# Patient Record
Sex: Female | Born: 1935
Health system: Southern US, Community
[De-identification: ages and names within clinical notes are randomized; demographics above are authoritative.]

## PROBLEM LIST (undated history)

## (undated) DIAGNOSIS — J309 Allergic rhinitis, unspecified: Secondary | ICD-10-CM

## (undated) DIAGNOSIS — Z87442 Personal history of urinary calculi: Secondary | ICD-10-CM

## (undated) DIAGNOSIS — H269 Unspecified cataract: Secondary | ICD-10-CM

## (undated) DIAGNOSIS — K219 Gastro-esophageal reflux disease without esophagitis: Secondary | ICD-10-CM

## (undated) DIAGNOSIS — M1990 Unspecified osteoarthritis, unspecified site: Secondary | ICD-10-CM

## (undated) DIAGNOSIS — D490 Neoplasm of unspecified behavior of digestive system: Secondary | ICD-10-CM

## (undated) DIAGNOSIS — D649 Anemia, unspecified: Secondary | ICD-10-CM

## (undated) DIAGNOSIS — I10 Essential (primary) hypertension: Secondary | ICD-10-CM

## (undated) DIAGNOSIS — E039 Hypothyroidism, unspecified: Secondary | ICD-10-CM

## (undated) DIAGNOSIS — J45909 Unspecified asthma, uncomplicated: Secondary | ICD-10-CM

## (undated) HISTORY — PX: PAROTID GLAND TUMOR EXCISION: SHX5221

## (undated) HISTORY — DX: Anemia, unspecified: D64.9

## (undated) HISTORY — PX: LUMBAR DISC SURGERY: SHX700

## (undated) HISTORY — PX: GANGLION CYST EXCISION: SHX1691

## (undated) HISTORY — DX: Unspecified cataract: H26.9

## (undated) HISTORY — DX: Unspecified osteoarthritis, unspecified site: M19.90

## (undated) HISTORY — DX: Allergic rhinitis, unspecified: J30.9

## (undated) HISTORY — DX: Neoplasm of unspecified behavior of digestive system: D49.0

## (undated) HISTORY — DX: Gastro-esophageal reflux disease without esophagitis: K21.9

## (undated) HISTORY — DX: Personal history of urinary calculi: Z87.442

## (undated) HISTORY — DX: Hypothyroidism, unspecified: E03.9

## (undated) HISTORY — DX: Essential (primary) hypertension: I10

## (undated) HISTORY — PX: TONSILLECTOMY AND ADENOIDECTOMY: SUR1326

## (undated) HISTORY — PX: TUBAL LIGATION: SHX77

## (undated) HISTORY — DX: Unspecified asthma, uncomplicated: J45.909

---

## 1991-01-26 HISTORY — PX: CERVICAL DISCECTOMY: SHX98

## 1998-07-15 ENCOUNTER — Other Ambulatory Visit: Admission: RE | Admit: 1998-07-15 | Discharge: 1998-07-15 | Payer: Self-pay | Admitting: Obstetrics and Gynecology

## 2000-06-02 ENCOUNTER — Encounter: Payer: Self-pay | Admitting: Cardiology

## 2000-06-02 ENCOUNTER — Encounter: Admission: RE | Admit: 2000-06-02 | Discharge: 2000-06-02 | Payer: Self-pay | Admitting: Cardiology

## 2002-04-06 ENCOUNTER — Encounter: Payer: Self-pay | Admitting: Cardiology

## 2002-04-06 ENCOUNTER — Encounter: Admission: RE | Admit: 2002-04-06 | Discharge: 2002-04-06 | Payer: Self-pay | Admitting: Cardiology

## 2003-01-26 HISTORY — PX: OTHER SURGICAL HISTORY: SHX169

## 2003-03-29 ENCOUNTER — Other Ambulatory Visit: Admission: RE | Admit: 2003-03-29 | Discharge: 2003-03-29 | Payer: Self-pay | Admitting: Obstetrics and Gynecology

## 2003-04-16 ENCOUNTER — Encounter: Admission: RE | Admit: 2003-04-16 | Discharge: 2003-04-16 | Payer: Self-pay | Admitting: Cardiology

## 2003-06-12 ENCOUNTER — Inpatient Hospital Stay (HOSPITAL_COMMUNITY): Admission: RE | Admit: 2003-06-12 | Discharge: 2003-06-16 | Payer: Self-pay | Admitting: Orthopedic Surgery

## 2005-06-16 ENCOUNTER — Other Ambulatory Visit: Admission: RE | Admit: 2005-06-16 | Discharge: 2005-06-16 | Payer: Self-pay | Admitting: Otolaryngology

## 2005-06-26 ENCOUNTER — Encounter: Admission: RE | Admit: 2005-06-26 | Discharge: 2005-06-26 | Payer: Self-pay | Admitting: Otolaryngology

## 2005-07-12 HISTORY — PX: CARDIOVASCULAR STRESS TEST: SHX262

## 2005-09-20 ENCOUNTER — Inpatient Hospital Stay (HOSPITAL_COMMUNITY): Admission: RE | Admit: 2005-09-20 | Discharge: 2005-09-22 | Payer: Self-pay | Admitting: Otolaryngology

## 2005-09-20 ENCOUNTER — Encounter (INDEPENDENT_AMBULATORY_CARE_PROVIDER_SITE_OTHER): Payer: Self-pay | Admitting: *Deleted

## 2006-01-25 HISTORY — PX: OTHER SURGICAL HISTORY: SHX169

## 2006-02-28 ENCOUNTER — Encounter: Admission: RE | Admit: 2006-02-28 | Discharge: 2006-02-28 | Payer: Self-pay | Admitting: Cardiology

## 2006-04-22 ENCOUNTER — Inpatient Hospital Stay (HOSPITAL_COMMUNITY): Admission: RE | Admit: 2006-04-22 | Discharge: 2006-04-25 | Payer: Self-pay | Admitting: Orthopedic Surgery

## 2006-04-22 HISTORY — PX: KNEE ARTHROPLASTY: SHX992

## 2007-08-11 ENCOUNTER — Encounter: Admission: RE | Admit: 2007-08-11 | Discharge: 2007-08-11 | Payer: Self-pay | Admitting: Cardiology

## 2009-02-07 ENCOUNTER — Ambulatory Visit: Payer: Self-pay | Admitting: Pulmonary Disease

## 2009-02-07 DIAGNOSIS — E039 Hypothyroidism, unspecified: Secondary | ICD-10-CM

## 2009-02-07 DIAGNOSIS — I10 Essential (primary) hypertension: Secondary | ICD-10-CM | POA: Insufficient documentation

## 2009-02-07 DIAGNOSIS — J45909 Unspecified asthma, uncomplicated: Secondary | ICD-10-CM

## 2009-02-07 DIAGNOSIS — J309 Allergic rhinitis, unspecified: Secondary | ICD-10-CM | POA: Insufficient documentation

## 2009-05-09 ENCOUNTER — Ambulatory Visit: Payer: Self-pay | Admitting: Pulmonary Disease

## 2009-06-06 ENCOUNTER — Telehealth (INDEPENDENT_AMBULATORY_CARE_PROVIDER_SITE_OTHER): Payer: Self-pay | Admitting: *Deleted

## 2009-09-16 ENCOUNTER — Ambulatory Visit: Payer: Self-pay | Admitting: Cardiology

## 2009-09-19 ENCOUNTER — Ambulatory Visit: Payer: Self-pay | Admitting: Cardiology

## 2010-01-22 ENCOUNTER — Ambulatory Visit: Payer: Self-pay | Admitting: Cardiology

## 2010-01-27 ENCOUNTER — Ambulatory Visit: Payer: Self-pay | Admitting: Cardiology

## 2010-02-26 NOTE — Assessment & Plan Note (Signed)
Summary: rov for dyspnea   Copy to:  [name] Primary Provider/Referring Provider:  [name]  CC:  3 month follow up and denies cough has only used pro-air once or twice over the last 3 months.  History of Present Illness: The pt comes in today for f/u of her dyspnea and ?asthma.  The decision was made at the last visit to leave her on emperic treatment for asthma (spirometry did not show obstruction) for the next 3 mos and re-evaluate.  It was unclear whether she had asthma or not.  I was concerned and remain so that a lot of her dyspnea may be due to her obesity, deconditioning, and very large hiatal hernia?  She comes in today where she is doing much better.  She feels consistent use of her current regimen has made a difference, and has not had to use rescue inhaler more than 1-2 times over the last 3 mos.  However, the pt has also joined a gym, and is going religiously 2 times a week.  Her weight is down almost 3 pounds since last visit.  Current Medications (verified): 1)  Synthroid 200 Mcg Tabs (Levothyroxine Sodium) .... 1/2 Tab By Mouth On M, W, and F and 1 Tab On The Other Days 2)  Lodine Xl 400mg  .... Take 1 Tablet By Mouth Once A Day 3)  Hyzaar 100/50 .... Take 1 Tablet By Mouth Once A Day 4)  Pepcid 20 Mg Tabs (Famotidine) .... Take 1 Tablet By Mouth Once A Day 5)  Vitamin E 200 Unit Caps (Vitamin E) .... Take 1 Tablet By Mouth Once A Day 6)  B Complex  Tabs (B Complex Vitamins) .... Take 1 Tablet By Mouth Once A Day 7)  Glucosamine-Chondroitin 1500mg /1250mg  .... Take 2 Tabs By Mouth At Bedtime 8)  Prenatal Vitamin .... Take 1 Tablet By Mouth Once A Day 9)  Proair Hfa 108 (90 Base) Mcg/act  Aers (Albuterol Sulfate) .Marland Kitchen.. 1-2 Puffs Every 4-6 Hours As Needed 10)  Advair Diskus 250-50 Mcg/dose  Misc (Fluticasone-Salmeterol) .... One Puff Twice Daily 11)  Lipitor 20 Mg Tabs (Atorvastatin Calcium) .Marland Kitchen.. 1 At Bedtime  Allergies (verified): 1)  ! Erythromycin 2)  ! *  "pain Meds"  Review of Systems      See HPI  Vital Signs:  Patient profile:   76 year old female Height:      61 inches Weight:      182.6 pounds O2 Sat:      95 % on Room air Temp:     98.1 degrees F oral Pulse rate:   75 / minute BP sitting:   150 / 90  (right arm)  Vitals Entered By: Renold Genta RCP, LPN (May 09, 2009 9:56 AM)  O2 Flow:  Room air CC: 3 month follow up,denies cough has only used pro-air once or twice over the last 3 months Comments Medications reviewed with patient Anna Adams RCP, LPN  May 09, 2009 10:01 AM    Physical Exam  General:  obese female in nad Lungs:  totally clear to auscultation Heart:  rrr Extremities:  minimal edema, no cyanosis Neurologic:  alert and oriented, moves all 4.   Impression & Recommendations:  Problem # 1:  DYSPNEA (ICD-786.05) It remains unclear whether the pt may have asthma or not.  She is improved from the last visit since being on advair consistently, but she also has been more active in a conditioning program.  We could do a methacholine challenge  test to put the issue to rest, but I think the easier thing to do is to stop advair at this point, and see how she does.  I continue to worry that her very large hiatal hernia and her obesity/deconditioning are more important drivers of her dyspnea.  I have asked her to continue with her conditioning program off advair, and let me know how things go.    Medications Added to Medication List This Visit: 1)  Lipitor 20 Mg Tabs (Atorvastatin calcium) .Marland Kitchen.. 1 at bedtime  Other Orders: Est. Patient Level II (78295)  Patient Instructions: 1)  try to come off advair for the next 4 weeks, and see if there is any change in your breathing.  If not, stay off advair and continue to work on weight loss and conditioning.  If you see a decline in your breathing, then get back on advair and stay on the medication.  Let me know either Bergstresser.   2)  followup with me as needed if your  breathing is not at an acceptable level.

## 2010-02-26 NOTE — Progress Notes (Signed)
Summary: FYI-doing well off of advair  Phone Note Call from Patient Call back at Home Phone [phone]   Caller: Patient Call For: clance Summary of Call: FYI: Pt states she was supposed and let him know is she was still off advair after 4 wks wants him to know that she is still off this med. Initial call taken by: Darletta Moll,  Jun 06, 2009 1:33 PM  Follow-up for Phone Call        Pt states she has been off of advair x 4 weeks and her breathing is doing well so she has continued to stay off of the inhaler. . She was told at last OV to give you an update.Carron Curie CMA  Jun 06, 2009 1:45 PM   Additional Follow-up for Phone Call Additional follow up Details #1::        let her know to stay off advair, and continuing working on exercise program.  call if issues with breathing. Additional Follow-up by: Barbaraann Share MD,  Jun 06, 2009 5:30 PM    Additional Follow-up for Phone Call Additional follow up Details #2::    LMOMTCB Vernie Murders  Jun 06, 2009 5:32 PM  [facility] Abigail Miyamoto RN  Jun 09, 2009 9:58 AM   Spoke with pt and notified of recs per [facility].  Pt verbalized understanding. Follow-up by: Vernie Murders,  Jun 09, 2009 2:49 PM

## 2010-02-26 NOTE — Assessment & Plan Note (Signed)
Summary: consult for asthma, dyspnea   Copy to:  Anna Adams Primary Provider/Referring Provider:  Cassell Adams  CC:  Pulmonary Consult.  History of Present Illness: The pt is a 76y/o female who I have been asked to see for asthma and dyspnea.  She was diagnosed with asthma at least 54yrs ago, and was followed by Dr. Andria Meuse.  She has been managed all of this time with albuterol as needed only.  She feels that she has been doing well until 2009, when she began to have more seasonal issues with her breathing.  Starting 01/08/09, she began to notice worsening cough and audible wheezing, as well as worsening sob.  Treated with antibiotics, and medrol taper without improvement.  She was treated again with medrol dose pak, and she began borrowed her daughter's nebulizer machine with xopenex.  This at least got her thru xmas.  She ultimately went to [facility] where she was put on advair, and also treated with a course of levaquin.  A cxr there showed no acute infiltrate, but she had a HUGE hiatal hernia with significant volume of stomach in chest.    The pt states that she improved within 48hrs.  She feels much better, and has continued on advair.  However, she notes that she has not returned to her usual active lifestyle.  She denies any cough or mucus currently.  She denies active reflux symptoms.  She does have a h/o thyroid disease, and tells me her last TSH was abnormal and required med adjustment.   Preventive Screening-Counseling & Management  Alcohol-Tobacco     Smoking Status: quit  Current Medications (verified): 1)  Synthroid 200 Mcg Tabs (Levothyroxine Sodium) .... 1/2 Tab By Mouth On M, W, and F and 1 Tab On The Other Days 2)  Lodine Xl 400mg  .... Take 1 Tablet By Mouth Once A Day 3)  Hyzaar 100/50 .... Take 1 Tablet By Mouth Once A Day 4)  Pepcid 20 Mg Tabs (Famotidine) .... Take 1 Tablet By Mouth Once A Day 5)  Vitamin E 200 Unit Caps (Vitamin E) .... Take 1 Tablet By Mouth  Once A Day 6)  B Complex  Tabs (B Complex Vitamins) .... Take 1 Tablet By Mouth Once A Day 7)  Glucosamine-Chondroitin 1500mg /1250mg  .... Take 2 Tabs By Mouth At Bedtime 8)  Prenatal Vitamin .... Take 1 Tablet By Mouth Once A Day 9)  Proair Hfa 108 (90 Base) Mcg/act  Aers (Albuterol Sulfate) .Marland Kitchen.. 1-2 Puffs Every 4-6 Hours As Needed 10)  Advair Diskus 250-50 Mcg/dose  Misc (Fluticasone-Salmeterol) .... One Puff Twice Daily  Allergies (verified): 1)  ! Erythromycin 2)  ! * "pain Meds"  Past History:  Past Medical History: h/o asthma HYPOTHYROIDISM (ICD-244.9)--h/o thyrotoxicosis, s/p RAI HYPERTENSION (ICD-401.9) ALLERGIC RHINITIS (ICD-477.9)    Past Surgical History: cervical disc surgery 1993 L parotid tumor surgery 1980 and 2007 Tonsillectomy and adenoidectomy 1941 ganglion cyst removed L wrist 1954 and 1963 L knee replacement 2005 R knee replacement 2008 thyrotoxicosis treated with radiation 1978  Family History: Reviewed history and no changes required. emphysema: father asthma: mother heart disease: mother, father rheumatism: father, mother cancer: mother (breast and brain)   Social History: Reviewed history and no changes required. Patient states former smoker.  started at age 31.  1ppd.  quit 1963. pt is married. pt has children. pt is a retired Engineer, civil (consulting). Smoking Status:  quit  Review of Systems       The patient complains of shortness of breath with  activity, shortness of breath at rest, sneezing, and joint stiffness or pain.  The patient denies coughing up blood, chest pain, irregular heartbeats, acid heartburn, indigestion, loss of appetite, weight change, abdominal pain, difficulty swallowing, sore throat, tooth/dental problems, headaches, nasal congestion/difficulty breathing through nose, itching, ear ache, anxiety, depression, hand/feet swelling, rash, and fever.    Vital Signs:  Patient profile:   76 year old female Height:      62 inches Weight:       185.13 pounds BMI:     33.98 O2 Sat:      96 % on Room air Temp:     97.6 degrees F oral Pulse rate:   104 / minute BP sitting:   140 / 74  (left arm) Cuff size:   regular  Vitals Entered By: Arman Filter LPN (February 07, 2009 1:33 PM)  O2 Flow:  Room air CC: Pulmonary Consult Comments Medications reviewed with patient Arman Filter LPN  February 07, 2009 1:33 PM    Physical Exam  General:  obese female in nad Eyes:  PERRLA and EOMI.   Nose:  patent without discharge Mouth:  clear Neck:  no jvd, tmg, LN Lungs:  decreased depth of inspiration, no wheezing or rhonchi Heart:  rrr, 2/6 sem Abdomen:  soft and nontender, bs+ Extremities:  1+ edema bilat, pulses intact distally no cyanosis Neurologic:  alert and oriented, moves all 4.   Impression & Recommendations:  Problem # 1:  INTRINSIC ASTHMA, UNSPECIFIED (ICD-493.10) the pt has a h/o asthma dating back 58yrs, but has not been on ICS in the past.  It is unclear to me if her recent symptomatology was indeed caused by asthma given her poor response to by mouth steroids, but she feels the advair ultimately did make her better.  Her spirometry today shows no airflow obstruction, but does show severe restriction (see below).  At this point, I would like to continue her on advair since it seems to have helped.  I would probably give her 3 mos of therapy, and then re-evaluate whether she really needs that level of treatment.  Will also give [place] an opportunity to establish whether she really has clinically signficant asthma.  Problem # 2:  DYSPNEA (ICD-786.05)  the pt has had issues with sob that may or may not be related to asthma.  She is better, but has not returned to her normal activities.  Her spirometry today shows severe restriction, and this may be due to her centripetal obesity vs her very large hiatal hernia.  Now that she is doing better, would like her to return to her normal activities and see how things go.  If she  continues to have sob, would consider whether her large hiatal hernia could be contributing, and whether to consider surgical evaluation.  Medications Added to Medication List This Visit: 1)  Synthroid 200 Mcg Tabs (Levothyroxine sodium) .... 1/2 tab by mouth on m, w, and f and 1 tab on the other days 2)  Lodine Xl 400mg   .... Take 1 tablet by mouth once a day 3)  Hyzaar 100/50  .... Take 1 tablet by mouth once a day 4)  Pepcid 20 Mg Tabs (Famotidine) .... Take 1 tablet by mouth once a day 5)  Vitamin E 200 Unit Caps (Vitamin e) .... Take 1 tablet by mouth once a day 6)  B Complex Tabs (B complex vitamins) .... Take 1 tablet by mouth once a day 7)  Glucosamine-chondroitin 1500mg /1250mg   .... Take  2 tabs by mouth at bedtime 8)  Prenatal Vitamin  .... Take 1 tablet by mouth once a day 9)  Proair Hfa 108 (90 Base) Mcg/act Aers (Albuterol sulfate) .Marland Kitchen.. 1-2 puffs every 4-6 hours as needed 10)  Advair Diskus 250-50 Mcg/dose Misc (Fluticasone-salmeterol) .... One puff twice daily  Other Orders: Consultation Level IV (16109) Spirometry w/Graph (60454)  Patient Instructions: 1)  continue on advair for the next 3 mos 2)  followup with me in 3mos 3)  if you cannot return to your normal activities due to persistent shortness of breath, please call me.  Would consider looking closer at hiatal hernia if this is the case. 4)  work on weight loss.   Prescriptions: ADVAIR DISKUS 250-50 MCG/DOSE  MISC (FLUTICASONE-SALMETEROL) One puff twice daily  #3 x 1   Entered and Authorized by:   Barbaraann Share MD   Signed by:   Barbaraann Share MD on 02/07/2009   Method used:   Print then Give to Patient   RxID:   [id number]     CardioPerfect Spirometry  ID: [id number] Patient: Anna Adams DOB: May 09, 1934 Age: 76 Years Old Sex: Female Race: White Height: 62 Weight: 185.13 Status: Unconfirmed Recorded: 02/07/2009 2:11 PM  Parameter  Measured Predicted %Predicted FVC     0.95        2.66         35.70 FEV1     0.72        2.00        35.90 FEV1%   75.62        75.51        100.10 PEF    3.39        5.16        65.80   Interpretation: severe restriction, no obstruction by FEV1/FVC ratio cannot exclude a component of airtrapping without lung volumes, but FVL most c/w restriction.

## 2010-04-20 ENCOUNTER — Telehealth: Payer: Self-pay | Admitting: *Deleted

## 2010-04-20 ENCOUNTER — Ambulatory Visit
Admission: RE | Admit: 2010-04-20 | Discharge: 2010-04-20 | Disposition: A | Discharge status: Home / Self Care | Payer: Medicare Other | Source: Ambulatory Visit | Attending: Cardiology | Admitting: Cardiology

## 2010-04-20 DIAGNOSIS — R0602 Shortness of breath: Secondary | ICD-10-CM

## 2010-04-20 DIAGNOSIS — R05 Cough: Secondary | ICD-10-CM

## 2010-04-20 NOTE — Telephone Encounter (Signed)
Message copied by Regis Bill on Mon Apr 20, 2010  2:08 PM ------      Message from: Sheffield Slider      Created: Mon Apr 20, 2010 10:47 AM      Regarding: STILL HAVING SOB/BRACKBILL      Contact: [phone]       PT SAID TO LET Anna Adams KNOW STILL HAVING ISSUES WITH SOB AT TIMES. WHERE DO SHE GO FROM HERE? CHART IN BOX.  1047AM

## 2010-04-20 NOTE — Telephone Encounter (Signed)
Patient phoned and has finished zpak, medrol dose-pak, and started advair 3/21 and is still having some shortness of breath.  Thought she was getting better, however last night had a hard time breathing,  Will get chest xray.  Advised patient.

## 2010-04-21 ENCOUNTER — Other Ambulatory Visit (INDEPENDENT_AMBULATORY_CARE_PROVIDER_SITE_OTHER): Payer: Medicare Other | Admitting: *Deleted

## 2010-04-21 ENCOUNTER — Telehealth: Payer: Self-pay | Admitting: *Deleted

## 2010-04-21 DIAGNOSIS — R0602 Shortness of breath: Secondary | ICD-10-CM

## 2010-04-21 DIAGNOSIS — R0789 Other chest pain: Secondary | ICD-10-CM

## 2010-04-21 LAB — BASIC METABOLIC PANEL
BUN: 15 mg/dL (ref 6–23)
Calcium: 10.2 mg/dL (ref 8.4–10.5)
Glucose, Bld: 110 mg/dL — ABNORMAL HIGH (ref 70–99)

## 2010-04-21 NOTE — Telephone Encounter (Signed)
Called patient and advised of chest xray,  Dr Patty Sermons does however want to get BNP, BMET, & CBC.  Pt scheduled for labs at 11:00.

## 2010-04-22 LAB — CBC WITH DIFFERENTIAL/PLATELET
Basophils Absolute: 0 10*3/uL (ref 0.0–0.1)
Basophils Relative: 0 % (ref 0–1)
Eosinophils Absolute: 0.1 10*3/uL (ref 0.0–0.7)
Eosinophils Relative: 1 % (ref 0–5)
HCT: 41 % (ref 36.0–46.0)
MCH: 28.5 pg (ref 26.0–34.0)
MCHC: 32.4 g/dL (ref 30.0–36.0)
MCV: 88 fL (ref 78.0–100.0)
Monocytes Absolute: 1.3 10*3/uL — ABNORMAL HIGH (ref 0.1–1.0)
RDW: 13.4 % (ref 11.5–15.5)

## 2010-04-24 ENCOUNTER — Telehealth: Payer: Self-pay | Admitting: *Deleted

## 2010-04-24 NOTE — Progress Notes (Signed)
Advised patient

## 2010-04-24 NOTE — Telephone Encounter (Signed)
Message copied by Regis Bill on Fri Apr 24, 2010 10:01 AM ------      Message from: [name]      Created: Wed Apr 22, 2010  9:36 AM       Please report.  Her white count is elevated at 17,000.Her be met is satisfactory with slightly elevated blood sugar at 110.  She is still clinically ill and we will call her in Avelox 400 mg daily for 5 days.  Her B. Natruretic peptide is not yet reported

## 2010-04-24 NOTE — Telephone Encounter (Signed)
Message copied by Regis Bill on Fri Apr 24, 2010 10:02 AM ------      Message from: [name]      Created: Thu Apr 23, 2010  3:00 PM       bnp norma.  No chf.

## 2010-04-24 NOTE — Progress Notes (Signed)
Advised 

## 2010-04-24 NOTE — Telephone Encounter (Signed)
Advised patient

## 2010-04-24 NOTE — Telephone Encounter (Signed)
Advised and rx called

## 2010-06-12 NOTE — Discharge Summary (Signed)
Anna Adams, Anna Adams                            ACCOUNT NO.:  [id number]   MEDICAL RECORD NO.:  [id number]                   PATIENT TYPE:  INP   LOCATION:  5017                                 FACILITY:  [facility]   PHYSICIAN:  Dyke Brackett, M.D.                 DATE OF BIRTH:  1934-12-27   DATE OF ADMISSION:  06/12/2003  DATE OF DISCHARGE:  06/16/2003                                 DISCHARGE SUMMARY   ADMISSION DIAGNOSES:  1. End-stage osteoarthritis, left knee.  2. Chronic anemia, receiving Procrit.  3. Hypertension.  4. Thyroid disease.  5. Hiatal hernia.  6. History of asthma.   DISCHARGE DIAGNOSES:  1. Status post left total knee arthroplasty.  2. Acute blood loss anemia secondary to surgery, asymptomatic.  3. Chronic anemia, receiving Procrit.  4. Hypertension.  5. Thyroid disease.  6. Hiatal hernia.  7. History of asthma.   HISTORY OF PRESENT ILLNESS:  Anna Adams is a 76 year old white female with knee  pain, left knee for at least 15 years.  However, left knee pain has became  progressively worse over the past two years.  The patient notes that she has  significant difficulty with activities due to stiffness in the knee.  She  does have deep, achy pain with occasional sharp shooting pain, mostly along  the medial joint.  The pain does radiate down the lower leg.  The patient  notes popping and grinding in the left knee.  X-rays of the left knee reveal  bone on bone in the medial compartment.  The patient has a history of  chronic anemia and has received Procrit injections prior to admission.  The  patient was admitted to the hospital on Jun 12, 2003, to undergo a left  total knee arthroplasty.   ALLERGIES:  LUFYLLIN, MEVACOR, IV CONTRAST DYE.   CURRENT MEDICATIONS:  1. Hyzaar 25/12.5 mg p.o. daily.  2. Synthroid 0.2 mg p.o. daily.  3. Lodine XL 400 mg p.o. daily.  4. Prenatal vitamins one p.o. daily.  5. Pepcid 20 mg p.o. daily.  6. Lipitor 20 mg p.o. daily.  7.  Vitamin E 200 international units daily.  8. Glucosamine and chondroitin daily.  9. Albuterol inhaler p.r.n.  10.      Niferex Forte 150 mg p.o. daily.   SURGICAL PROCEDURE:  The patient was taken to the operating room on Jun 12, 2003, by Dyke Brackett, M.D., assisted by Legrand Pitts. Duffy, P.A.  The patient  was placed under general anesthesia with a preop femoral nerve block.  The  patient then underwent a left total knee arthroplasty using the following  components:  LCS standard femur, three-peg patella, size 3 tibia, and a 12.5  mm bearing.  The patient tolerated the procedure well and returned to  recovery in good, stable condition.   CONSULTATIONS:  The following consults were obtained while the  patient was  hospitalized:  PT/OT, case management.   HOSPITAL COURSE:  The patient developed acute blood loss anemia secondary to  surgery, however remained asymptomatic.  The patient did develop a slight  bump in the white blood count, however remained afebrile and at time of  discharge, white count was trending down at 9900.  The patient did develop  some nausea due to GI upset thought to be caused by iron and Colace.  These  medications were all held.  The patient was discharged home on postop day 4  in good, stable condition, and the patient was afebrile, vital signs were  stable.   LABORATORY DATA:  Routine labs on admission:  CBC on admission was all  values within normal limits.  Coagulation studies on admission were all  within normal limits.  Routine chemistries on admission:  Glucose was  slightly elevated at 100, CO2 was 33, calcium was 10.8.  Hepatic enzymes on  admission, AST 23, ALT 22, ALP of 120, total bilirubin was 0.7.  Urinalysis  on admission was negative.   X-RAYS:  Two views of the left knee showed total left knee replacement and  good alignment and __________ position.   DISCHARGE INSTRUCTIONS:   DISCHARGE MEDICATIONS:  The patient was to resume home  medications and add  the following:   1. Lovenox 40 mg one injection subcu daily at 8 a.m., for a total of 11     days.  2. Tylenol for pain.   PAIN MANAGEMENT:  Ice and elevation and Tylenol.   ACTIVITY:  Weightbearing as tolerated with walker.  Home health PT, [facility].   DIET:  No restrictions.   WOUND CARE:  The patient is to change dressing daily and may shower after  two days if no drainage.  The patient is to call the office at [phone] if  temperature is greater than 100.5, chills, foul-smell drainage, or pain not  controlled.   SPECIAL INSTRUCTIONS:  CPM 0-90 degrees, increase by 10 degrees daily, six  to eight hours per day.  The patient is to follow up with Dr. Madelon Lips in the  office approximately 10 days from discharge.  The patient is to call [phone] for appointment.      Richardean Canal, Arnetha Courser, M.D.    GC/MEDQ  D:  08/28/2003  T:  08/29/2003  Job:  [id number]

## 2010-06-12 NOTE — Discharge Summary (Signed)
NAMECANDIA, KINGSBURY                  ACCOUNT NO.:  [id number]   MEDICAL RECORD NO.:  [id number]          PATIENT TYPE:  INP   LOCATION:  5022                         FACILITY:  [facility]   PHYSICIAN:  Dyke Brackett, M.D.    DATE OF BIRTH:  Mar 29, 1934   DATE OF ADMISSION:  04/22/2006  DATE OF DISCHARGE:  04/25/2006                               DISCHARGE SUMMARY   ADMISSION DIAGNOSES:  1. End-stage osteoarthritis, right knee.  2. Hypertension.  3. History of excision of parotid tumor.  4. History of renal calculi.   DISCHARGE DIAGNOSES:  1. End-stage osteoarthritis, right knee, status post right total knee      arthroplasty.  2. Acute blood loss anemia secondary to surgery.  3. History of elevated liver functions due to Lipitor.  4. Hyponatremia, mild.  5. Constipation, now resolved.  6. Hypertension.  7. History of parotid tumor, excised.  8. History of renal calculi.   SURGICAL PROCEDURES:  On April 22, 2006, Ms. Underhill underwent a right total  knee arthroplasty by Dr. Marcie Mowers assisted by Rexene Edison, PA-C.  She had an LPS complete metal back patella cemented size standard placed  with LPS complete RP insert size standard 10 mm thickness.  An LCS  complete primary femoral component cemented size standard right with a  DePuy NBT keel tibial tray cemented size 3.   COMPLICATIONS:  None.   CONSULTATIONS:  1. Physical therapy consult April 23, 2006.  2. Occupational therapy consult April 25, 2006.  3. Case management consult.   HISTORY OF PRESENT ILLNESS:  This 76 year old white female patient  presented to Dr. Madelon Lips with history of moderately severe occasional  stabbing right knee pain.  She has a history of left knee replacement in  2005.  Right knee pain is getting worse and interfering with her  activities of daily living.  She has difficulty walking, and x-rays show  end-stage arthritic changes.  Because of this, she is presenting for a  right knee replacement.   HOSPITAL COURSE:  Ms. Folden tolerated her surgical procedure well without  immediate postoperative complications.  She was transferred to 5000.  Postop day #1, T-max was 101.1, vital signs were stable.  Hemoglobin 10,  hematocrit 29.4.  AST was 52. ALT was 54.  Leg was neurovascularly  intact.  She was weaned off oxygen and started on therapy per protocol.  Other labs were monitored.   Postop day #2, she was voiding without difficulty.  T-max was 99.4,  vital signs stable.  Hemoglobin 9.1, hematocrit 26.9.  Sodium a bit low  of 132.  Hemovac was discontinued. Dressing was changed. Leg was  neurovascularly intact.  O2 saturations had been a bit low, and that was  monitored.  She was switched to p.o. pain medications and continued on  therapy.  She did have some problems with constipation, and that was  treated effectively with a laxative.   She continued to make good progress.  On postop day #3, pain was well  controlled with p.o. medications.  Her hemoglobin was 8.7, hematocrit  25.4.  Sodium improved to 136.  Vital signs were otherwise stable.  Leg  was neurovascularly intact.  CBC was rechecked, and it was felt she was  ready for discharge home was discharged home later that day.   DISCHARGE INSTRUCTIONS:   DIET:  She can resume her regular prehospitalization diet.   MEDICATIONS:  She may resume her home medications except no glucosamine,  loading vitamin E, or Lipitor.  Home medications include:  1. Synthroid 0.2 mg p.o. q.a.m..  2. Hyzaar 50/12.5 mg 1 tablet p.o. q.a.m.Marland Kitchen  3. Pepcid 20 mg p.o. q.a.m.Marland Kitchen  4. Prenatal vitamin 1 tablet p.o. q.a.m..  5. Tylenol p.r.n. for pain.  6. Albuterol inhaler to use p.r.n. difficulty breathing.   Additional medications at this time include:  1. Lovenox 40 mg subcutaneously q.a.m. with the last dose to be May 06, 2006.  2. Dilaudid 2 mg 1 tablet p.o. q.3-4 h p.r.n. for pain.  3. Reglan 5 mg 1 tablet p.o. q.8 h p.r.n. for nausea.    ACTIVITY:  She can be out of bed weightbearing as tolerated on the right  leg.  She is to have home CPM zero to 90 degrees 6-8 hours a day.  Home  health PT.  Please see the blue total knee discharge sheet for further  activity instructions.   WOUND CARE:  She may shower after no drainage from the wound for 2 days.  Please see the blue total knee discharge sheet for further wound care  instructions.   FOLLOWUP:  She needs to follow up with Dr. Madelon Lips in our office on  Thursday, May 05, 2006, and needs to call [phone] for that  appointment.   LABORATORY DATA:  X-ray taken of her right knee on March 28 showed the  right knee replacement without immediate complicating features.   Hemoglobin and hematocrit ranged from 13.6 and 39.9, respectively, on  the March 28 to 8.7 and 25.4, respectively, on March 31 to a repeat on  March 31 at 9.5 and 28.4, respectively.  White count went to a high of  11.1 on March 30 and then was back within normal limits.   Sodium dropped to a low of 132 on March 30 and then bounced back within  normal limits.  Glucose ranged from 100 on March 25 to 130 on March 29  to 112 on March 31.   On March 25, her AST was 52, ALT 54, alkaline phosphatase 171.  All  other laboratory studies were within normal limits.      Legrand Pitts Duffy, Arnetha Courser, M.D.  Electronically Signed    KED/MEDQ  D:  06/08/2006  T:  06/08/2006  Job:  [id number]   cc:   [name], M.D.

## 2010-06-12 NOTE — H&P (Signed)
NAME:  Anna Adams, Anna Adams NO.:  [id number]   MEDICAL RECORD NO.:  [id number]                   PATIENT TYPE:  INP   LOCATION:                                       FACILITY:  [facility]   PHYSICIAN:  Dyke Brackett, M.D.                 DATE OF BIRTH:  06-02-1934   DATE OF ADMISSION:  06/12/2003  DATE OF DISCHARGE:                                HISTORY & PHYSICAL   CHIEF COMPLAINT:  Left knee pain.   HISTORY OF PRESENT ILLNESS:  The patient is a 76 year old white female with  knee pains and problems in the left for in excess of 15 years, but over the  last 2 years, it has significantly worsened.  She does have significant  difficulty with activities due to stiffness.  She does have a deep achy pain  with occasional sharp shooting pain mostly along the medial joint; it does  radiate down the lower leg.  She does have popping and grinding.  X-rays  reveal bone-on-bone medial compartment.  The patient does have a history of  chronic anemia and she has received Procrit injections prior to this  procedure.   ALLERGIES:  LUFYLLIN, MEVACOR and IV CONTRAST DYE.   CURRENT MEDICATIONS:  1. Hyzaar 25/12.5 mg p.o. daily.  2. Synthroid 0.2 mg p.o. daily.  3. Lodine XL 400 mg p.o. daily.  4. Prenatal vitamins 1 tablets p.o. daily.  5. Pepcid 20 mg p.o. daily.  6. Lipitor 20 mg p.o. daily.  7. Niferex Forte 150 mg p.o. daily.  8. Vitamin E 200 IU daily.  9. Glucosamine and chondroitin daily.  10.      Albuterol inhaler -- MDI -- p.r.n.   PAST MEDICAL HISTORY:  1. Hypertension.  2. Thyroid disease with thyrotoxicosis.  3. Hiatal hernia.  4. Asthma.  5. Chronic anemia.   PAST SURGICAL HISTORY:  1. T&A done in 1941.  2. Ganglion cysts, left wrist, 1954 and 1963.  3. Tubal ligation in 1974.  4. Parotid tumor, left, 1980.  5. Cervical disk in 1993.  6. The patient denies any complications to the above-mentioned procedures.   SOCIAL HISTORY:  The patient is a  76 year old short-in-stature, slightly  heavy-set white female.  Denies any history of smoking or alcohol use.  She  is married.  She has several grown children.  She lives in a 1-story house.  She is a retired Astronomer.   FAMILY PHYSICIAN:  Dr. [name] at [phone].   FAMILY MEDICAL HISTORY:  Mother is alive with a history of hypertension and  breast cancer with metastasis.  Father is deceased from heart disease.  The  patient has no siblings.   REVIEW OF SYSTEMS:  Review of systems is positive for glasses at all times.  She does have occasional shortness of breath with exertion; she describes it  as very slight; she had  no related cardiac symptoms; she relates it to her  conditioning and knee discomfort.  She has occasional problems with reflux  but as long as she takes her medicines, it is fairly well-controlled.  Otherwise, review of systems are negative.   PHYSICAL EXAMINATION:  VITALS:  Height is 5 foot 2 inches.  Weight is 178  pounds.  Blood pressure is 148/82, heart rate 80 and regular, respirations  12, patient is afebrile.  GENERAL:  This is short-in-stature, healthy-appearing, slightly heavy-set  white female.  She ambulates with a slight limp to the left side.  She is  able to get herself on and off the exam table without any difficulty or  having to use her arms to get out of the chair.  HEENT:  Head was normocephalic.  Pupils equal, round and reactive.  Extraocular movements intact.  External ears were without deformities.  Gross hearing was intact.  Nasal septum was midline.  Oral buccal mucosa was  pink and moist without lesions.  NECK:  Neck was supple.  No palpable lymphadenopathy.  Thyroid region was  nontender.  The patient had good range of motion of her cervical spine  without any difficulty or tenderness.  CHEST:  Lung sounds were clear and equal.  No wheezes, rales, rhonchi or  rubs noted.  HEART:  Regular rate and rhythm.  No murmurs.  ABDOMEN:  Abdomen  was round, soft and nontender.  Bowel sounds were  normoactive.  She had no hepatosplenomegaly.  CVA region was nontender.  EXTREMITIES:  Upper extremities were symmetrically sized and shaped.  She  had full range of motion of her shoulders, elbows and wrists.  Motor  strength was 5/5.  Lower extremities:  Right and left hips had full  extension, flexion up to 120 degrees with 20 degrees internal and external  rotation without any difficulty.  Right knee had no signs of erythema or  ecchymosis.  She was tenderness in medial or lateral joint, no effusion.  She had full extension, flexion to 115 degrees, no medial or lateral laxity,  no anterior or posterior drawer.  The calf was nontender.  Left knee was  without any signs of erythema or ecchymosis, no effusion.  She was tender  over medial joint line.  She had full extension.  She had about 105 degrees  of flexion.  She opened up with about 5 degrees with valgus stress.  The  calf was nontender.  Ankles were symmetrical with good dorsi and  plantarflexion.  PERIPHERAL VASCULATURE:  Carotid pulses were 2+, no bruits.  Radial pulses  were 2+.  Dorsalis pedis pulses were 1+, unable to palpate the posterior  tibial.  She had trace edema in bilateral ankles.  No venous stasis changes  or significant varicosities.  NEUROLOGIC:  The patient was conscious, alert and appropriate.  Cranial  nerves II-XII were grossly intact.  She was grossly intact to light touch  sensation from head to toe.  She had no gross neurological defects.  BREAST, RECTAL AND GU:  Exams were deferred at this time.   IMPRESSION:  1. End-stage osteoarthritis, left knee.  2. Chronic anemia, receiving Procrit.  3. Hypertension.  4. Thyroid disease.  5. Hiatal hernia.  6. History of asthma.   PLAN:  The patient will undergo all routine labs and tests prior to having a left total knee arthroplasty by Dr. Dyke Brackett at [facility] on  Jun 12, 2003.  The patient  will have Procrit injection #  4 on the date of  surgery.      Jamelle Rushing, Arnetha Courser, M.D.    RWK/MEDQ  D:  06/05/2003  T:  06/05/2003  Job:  [id number]   cc:   Thera Flake., M.D.  [address] [place]  [place] [address]  Fax: [phone]

## 2010-06-12 NOTE — Op Note (Signed)
NAMEAZUL, Anna Adams                            ACCOUNT NO.:  [id number]   MEDICAL RECORD NO.:  [id number]                   PATIENT TYPE:  INP   LOCATION:  2899                                 FACILITY:  [facility]   PHYSICIAN:  Thera Flake., M.D.             DATE OF BIRTH:  23-Aug-1934   DATE OF PROCEDURE:  06/12/2003  DATE OF DISCHARGE:                                 OPERATIVE REPORT   PREOPERATIVE DIAGNOSIS:  Osteoarthritis, left knee with varus deformity.   POSTOPERATIVE DIAGNOSIS:  Osteoarthritis, left knee with varus deformity.   OPERATION PERFORMED:  Left total knee replacement (cemented), LCS standard  femur, patella, size 3 tibia, 12.5 mm bearing.   SURGEON:  Dyke Brackett, M.D.   ANESTHESIA:  General.   ESTIMATED BLOOD LOSS:  Minimal.   TOURNIQUET TIME:  One hour 10 minutes.   ASSISTANT:  Legrand Pitts. Duffy, P.A.   DESCRIPTION OF PROCEDURE:  Straight skin incision, medial parapatellar  approach to the knee.  Excision of marginal osteophytes from the femur was  carried out followed by cutting the tibia 2 mm below the most diseased  medial compartment.  This was followed by sizing the femur to be standard  with anterior and posterior femoral cut to create a flexion gap of 12.5 mm  which was balanced with an extension gap of the same.  Distal femoral cut  was carried out and again, there was some minor stripping of the medial side  done to balance the medial and lateral sides and the flexion and extension  gap were equalized as mentioned.  Anterior, posterior chamfers were cut with  key holes for the femoral prosthesis with excision of some posterior  osteophytes on the femur, particularly on the medial side of the knee,  posterior medial femur.  This was followed by excision of excess menisci  remnants, releasing the posterior cruciate ligament.  The tibia was sized to  be a size 3 tibia with a keel punch being used to cut the keel for the  prosthesis with the trial  reduction carried out 12.5 mm femur and 2.5 mm  insert on a size 3 tibia and a standard femur.  Full extension was possible.  There was no obvious subluxation.  Good stability in all planes tested in  flexion and extension with no tendency to bearing spinout.  Patella cut  leaving about 14 mm of native patella with 3-peg patella and a trial patella  tracked normally.  Trial components were removed followed by irrigation of  the bony surfaces and insertion of two packs of antibiotic, each with 750 mg  of Kefurox for antibiotic impregnated cement.  Final components were  inserted, tibia followed by femur.  Trial bearing was used to allow  inspection of the posterior aspect of the knee. This was removed.  No  excessive bleeding was noted.  After the tourniquet was released, the  cement  prior to this was allowed to harden.  Small remnants of the cement were  trimmed from the periphery of the  prosthesis.  Closure was effected with #1 Ethibond, 2-0 Vicryl.  Hemovac  drain was placed exiting superolaterally in the lateral gutter.  Good  hemostasis was obtained.  Skin closure was clips.  Lightly compressive  sterile dressing applied.  The patient was taken to the recovery room in  stable condition.                                               Thera Flake., M.D.    WDC/MEDQ  D:  06/12/2003  T:  06/12/2003  Job:  [id number]

## 2010-06-12 NOTE — Op Note (Signed)
NAMESOL, ODOR                  ACCOUNT NO.:  [id number]   MEDICAL RECORD NO.:  [id number]          PATIENT TYPE:  INP   LOCATION:  2899                         FACILITY:  [facility]   PHYSICIAN:  Dyke Brackett, M.D.    DATE OF BIRTH:  [birth year]   DATE OF PROCEDURE:  04/22/2006  DATE OF DISCHARGE:                               OPERATIVE REPORT   PREOPERATIVE DIAGNOSIS:  Osteoarthritis of right knee with severe varus  and flexion contracture.   POSTOPERATIVE DIAGNOSIS:  Osteoarthritis of right knee with severe varus  and flexion contracture.   OPERATION:  Right total knee replacement (LCS cemented standard femur  size 3 tibia with 10 mm bearing with standard patella 3-peg).   SURGEON:  Dyke Brackett, M.D.   ASSISTANT:  Lolita Cram, P.A.   TOURNIQUET TIME:  Approximately 1 hour 20 minutes.   DESCRIPTION OF PROCEDURE:  Sterile prep and drape, exsanguination of the  leg and placement of the 75 straight skin incisions made and medial  parapatellar approach to the knee made.  The patient had a significant  varus and almost a rotational deformity of her femur with a flexion  contracture.  The tibia was cut using a slotted guide 2.5 mm below the  most diseased medial compartment.  Osteophytes were removed from the  medial aspect of the tibia.  The femur was sized.  The standard with a  anterior-posterior femoral cut with a 10 mm flexion graft, essentially  an extension gap of 10 mm.  Distal 4 degree valgus cut was made as well  followed by chamfer cuts.  Due to the rotational issues on the leg and  possible hypoplasia of the lateral femoral condyle, there was a very  minimal cut made on this side but there was good fit relative to this  once the final prosthesis was inserted.  Excess menisci were removed  from the joint.  The PCL was completely released.  The tibia sized to be  a size 3.  Key hole cut fro the tibia was made followed by placement of  the trial.  The trial was inserted  followed by cutting patella leaving  14 mm native patella.  Three peg patellar trial placed.  All trial  components were placed.  Full flexion and extension, no instability,  slight hyperextension noted in full extension with no instability.  All  of the trials were removed followed by copious irrigation, pulsatile  lavage.  Two bags of cement were mixed each with 1.2 grams of tobramycin  per batch.  Cement was inserted in the doughy state with tibia followed  by femur and patella.  Cement was allowed to harden.  Excess cement was  removed.  Two towel clips were used for a capsular trial, closure and  then again excellent stability noted.  The tourniquet was released after  the cement was hardened.  No excessive bleeding was noted.  The closure  was effected with interrupted Ethibond, 2-0 Vicryl and skin clips.  Hemovac drain was placed exiting superolaterally into the lateral  gutter, taken to the recovery room  in stable condition.      Dyke Brackett, M.D.  Electronically Signed    WDC/MEDQ  D:  04/22/2006  T:  04/22/2006  Job:  [id number]

## 2010-06-12 NOTE — Op Note (Signed)
NAMEKASARA, SCHOMER                  ACCOUNT NO.:  [id number]   MEDICAL RECORD NO.:  [id number]          PATIENT TYPE:  INP   LOCATION:  5735                         FACILITY:  [facility]   PHYSICIAN:  Zola Button T. Lazarus Salines, M.D. DATE OF BIRTH:  October 17, 1934   DATE OF PROCEDURE:  09/20/2005  DATE OF DISCHARGE:                                 OPERATIVE REPORT   PREOPERATIVE DIAGNOSIS:  Recurrent left parotid pleomorphic adenoma.  Postauricular epithelial cyst.   POSTOPERATIVE DIAGNOSIS:  Recurrent left parotid pleomorphic adenoma.  Postauricular epithelial cyst.   PROCEDURE PERFORMED:  1. Revision left superficial parotidectomy.  2. Selective left neck dissection levels one and two.  3. Excision left scalp epithelial cyst.  4. Micro neurorrhaphy x2, left facial nerve.   SURGEON:  Gloris Manchester. Lazarus Salines, M.D.   ASSISTANTEnrigue Catena H. Pollyann Kennedy, MD   ANESTHESIA:  General orotracheal.   BLOOD LOSS:  Minimal.   COMPLICATIONS:  Minor injury, facial nerve branch x2.   FINDINGS:  A standard Blair incision, status post prior parotidectomy with  intact facial nerve function.  Heavy fibrosis at the main trunk of the  facial nerve and following the ramus mandibularis branch which actually  penetrated recurrent tumor.  Multiple tumor nodules in the scar, in the gap  between the left submandibular gland and the left parotid gland, and with  one nodule in the upper superficial left parotid lobe.  The lobulated  apparent epithelial cyst less than 2 cm in the left postauricular scalp  approximately 3 cm distant from the prior parotidectomy wound.  The facial  nerve dissection with most of the branches coming from the upper branch of  the pes.  One small level II left jugular node.   PROCEDURE:  With the patient in the comfortable supine position, general  orotracheal anesthesia was induced without difficulty.  At an appropriate  level, the table was turned 90 degrees with the patient placed in a slight  sitting  position with the knees supported consistent with her prior knee  replacement.  PAS hose were used.  A Foley catheter was placed in the  standard fashion.  The head was rotated to the right for access to the left  neck.  The neck was examined with the findings as described above.  The NIMS  system was applied in the standard fashion.  A minimal hair shave was  performed.  A sterile preparation and draping of the left face and neck was  accomplished in the standard fashion.   Beginning with the post auricular cyst, a 2 cm sharp incision was executed  and dissected around the cyst in the subcutaneous plane.  The cyst was  removed in this entirety and sent off for frozen section interpretation.  It  returned as a pilar cyst unrelated to the pleomorphic adenoma.  The wound  was closed with interrupted 4-0 chromic suture and skin staples.   Tattoos were applied along the proposed incision to allow reorientation,  especially of the auricular lobule.  The previous scar was marked out for  excision.  The incision was planned  down a relaxed neck crease, and up in  front of the ear several additional centimeters on both ends.  Palpation  revealed the masses as described above.   The incision was sharply executed leaving the skin island attached where the  scar had been excised.  The dissection was carried down along the  preauricular area to the cartilage of the external canal around the corner  to the mastoid, and then down the neck.  The sternocleidomastoid fascia was  stripped forward.  Dissection was carried along the level of the hyoid bone  and the digastric was identified.  The dissection was carried along the  ramus of the mandible after first identifying the ramus mandibularis nerve  and dissecting it from the surface of the submandibular gland and carrying  this upward.  Branches of the facial artery and vein were controlled at the  level of the mandible ligated, divided.  There was no  adenopathy in this  region.  The capsule of the submandibular gland was identified and elevated.  The dissection was carried forward to the mylohyoid muscle.  Hemostasis was  achieved with the [facility] scalpel, with electrocautery, and with silk  ligatures.  Working under the mylohyoid muscle, the lingual nerve as  identified and the submandibular ganglion was ligated and divided.  The  hypoglossal nerve was identified and care was taken to avoid damage to the  ranine veins.  Finally, the submandibular duct was identified and ligated.  The gland was dissected backwards and was removed separately.  No tumor was  identified.  Working around the anterior border of the sternocleidomastoid  muscle, the spinal accessory nerve was identified.  This was tracked back up  to the jugular vein.  Working forward, the fascia was rolled off the lateral  surface of the jugular vein including one small soft node.  This was allowed  to remain with the specimen.  The dissection was carried up along the facial  artery and vein which were ligated in turn and divided.  Part of this had  been performed with the submandibular gland excision.  Working up the  sternocleidomastoid muscle, the mastoid cortex was identified and the  dissection here was quite sticky consistent with prior dissection.  The  parotid flap was elevated and also was noted to be slightly sticky and an  additional 1 cm of skin margin was resected.  This was sent for permanent  interpretation as a skin margin.  There were nodules of apparent recurrent  tumor within the scar tissue of the previous incision.   Working on a broad front from the posterior belly of the digastric up to the  mastoid tip and then working down the root of the helix, the tragus, the  external canal and the mastoid cortex, the dissection was carried deeper. There was moderate fibrosis around the main trunk of the facial nerve,  although with the gland being essentially intact  in size, it is difficult to  know whether this was dissected in the previous procedure or not.  Under  direct vision, the main trunk of the facial nerve was fibrotic but was  identified.  There appeared to be tumor just medially inferior to the nerve.  Dissection was carried forward and the pes was identified.  Upon leaving the  region of the pes, the nerve was much more normal.  There was a palpable  nodule in the superior superficial left parotid gland.   Working along the branches starting superiorly and working  downward, the  nerves were dissected out to the periphery of the gland.  The gland was  rolled downward.  One communicating branch was noted to have been transected  in the frontozygomatic region.  Towards the isthmus, an additional small  communicating branch was identified as having been transected.  Both of  these were beneath the tumor mass and care was taken to obtain adequate  margin.  Upon rolling the gland downward, hemostasis was achieved with  bipolar cautery, with silk ligature, and with [facility] scalpel.  By stimulation,  all the major branches were working.  Approaching the inferior most branch,  there was very dense scar tissue and it was impossible to dissect forward  under direct vision.  Working retrograde from the [employer], direct  tumor was entered.  It was elected to amputate this portion.  Dissection was  carried along the lowest dissected nerve root which was probably corner of  the mouth and the remainder the tissue was amputated and rolled downward and  the specimen was delivered.  The apparent tumor material had been removed  from deep to the plane of the nerve but a true deep lobe dissection was not  performed.  One area there was a suspicious firmness which was biopsied and  sent for frozen section interpretation and returned as benign salivary  gland.  The specimen was delivered.  The wound was thoroughly irrigated.  Hemostasis was observed  including with Valsalva.  Full palpation revealed no  evidence of any additional tumor masses.  Hemostasis was observed.   Using the operating microscope and a 7-0 and 8-0 Ethilon stitch, the micro  neurorrhaphy was performed of the communicating branch at the isthmus level  and also of the communicating branch at the frontozygomatic region.  Two or  three stitches were applied at each trunk  perineurally.  This was  accomplished without difficulty.   The puncture was made in low neck and a 10-mm flat drain was placed in the  depths of the wound.  This was secured to the skin with a 3-0 Ethilon  stitch.   The wound was closed with interrupted 4-0 chromic sutures and finally the  skin was reapproximated with 5-0 Ethilon.  A good cosmetic reconstruction  was accomplished.  The drain was placed on suction and observed to be intact and containing air.  The patient was carefully cleaned and the wound was  dressed with bacitracin ointment.  At this point the procedure was  completed.  The patient was returned to Anesthesia, awakened, extubated, and  transferred to recovery in stable condition.   COMMENT:  A 76 year old white female now 29 years status post removal of a  benign parotid tumor, needle aspiration consistent with a recurrent  pleomorphic adenoma was indication for today's procedure.  Given the tumor  presence around the ramus mandibular nerve and in the inferior parotid  region, dissection of this nerve was not felt indicated.  A gross tumor  clearance was accomplished.  Anticipate routine postoperative recovery with  attention to analgesia, ice, elevation, and observation for bleeding,  emesis, or any signs of infection.      Gloris Manchester. Lazarus Salines, M.D.  Electronically Signed     KTW/MEDQ  D:  09/20/2005  T:  09/21/2005  Job:  [id number]   cc:   [name], M.D.

## 2010-08-03 ENCOUNTER — Other Ambulatory Visit: Payer: Self-pay | Admitting: Cardiology

## 2010-08-03 DIAGNOSIS — Z1231 Encounter for screening mammogram for malignant neoplasm of breast: Secondary | ICD-10-CM

## 2010-08-10 ENCOUNTER — Ambulatory Visit
Admission: RE | Admit: 2010-08-10 | Discharge: 2010-08-10 | Disposition: A | Discharge status: Home / Self Care | Payer: Medicare Other | Source: Ambulatory Visit | Attending: Cardiology | Admitting: Cardiology

## 2010-08-10 DIAGNOSIS — Z1231 Encounter for screening mammogram for malignant neoplasm of breast: Secondary | ICD-10-CM

## 2010-09-01 ENCOUNTER — Encounter: Payer: Self-pay | Admitting: Cardiology

## 2010-09-02 ENCOUNTER — Other Ambulatory Visit: Payer: Medicare Other | Admitting: *Deleted

## 2010-09-02 ENCOUNTER — Ambulatory Visit (INDEPENDENT_AMBULATORY_CARE_PROVIDER_SITE_OTHER): Payer: Medicare Other | Admitting: *Deleted

## 2010-09-02 DIAGNOSIS — J309 Allergic rhinitis, unspecified: Secondary | ICD-10-CM

## 2010-09-02 DIAGNOSIS — J45909 Unspecified asthma, uncomplicated: Secondary | ICD-10-CM

## 2010-09-02 DIAGNOSIS — E039 Hypothyroidism, unspecified: Secondary | ICD-10-CM

## 2010-09-02 DIAGNOSIS — I10 Essential (primary) hypertension: Secondary | ICD-10-CM

## 2010-09-02 DIAGNOSIS — R0602 Shortness of breath: Secondary | ICD-10-CM

## 2010-09-02 LAB — CBC WITH DIFFERENTIAL/PLATELET
Basophils Absolute: 0 K/uL (ref 0.0–0.1)
Basophils Relative: 0.2 % (ref 0.0–3.0)
Eosinophils Absolute: 0.2 K/uL (ref 0.0–0.7)
Eosinophils Relative: 3.4 % (ref 0.0–5.0)
HCT: 40.8 % (ref 36.0–46.0)
Hemoglobin: 14 g/dL (ref 12.0–15.0)
Lymphocytes Relative: 18.8 % (ref 12.0–46.0)
Lymphs Abs: 1.2 K/uL (ref 0.7–4.0)
MCHC: 34.3 g/dL (ref 30.0–36.0)
MCV: 86.1 fl (ref 78.0–100.0)
Monocytes Absolute: 0.4 K/uL (ref 0.1–1.0)
Monocytes Relative: 5.9 % (ref 3.0–12.0)
Neutro Abs: 4.7 K/uL (ref 1.4–7.7)
Neutrophils Relative %: 71.7 % (ref 43.0–77.0)
Platelets: 279 K/uL (ref 150.0–400.0)
RBC: 4.74 Mil/uL (ref 3.87–5.11)
RDW: 13.5 % (ref 11.5–14.6)
WBC: 6.6 K/uL (ref 4.5–10.5)

## 2010-09-02 LAB — LIPID PANEL
Cholesterol: 175 mg/dL (ref 0–200)
HDL: 68.9 mg/dL (ref >39.00)
LDL Cholesterol: 82 mg/dL (ref 0–99)
Total CHOL/HDL Ratio: 3
Triglycerides: 123 mg/dL (ref 0.0–149.0)
VLDL: 24.6 mg/dL (ref 0.0–40.0)

## 2010-09-02 LAB — BASIC METABOLIC PANEL
BUN: 16 mg/dL (ref 6–23)
Chloride: 100 mEq/L (ref 96–112)
Glucose, Bld: 93 mg/dL (ref 70–99)
Potassium: 4.2 mEq/L (ref 3.5–5.1)
Sodium: 139 mEq/L (ref 135–145)

## 2010-09-02 LAB — TSH: TSH: 0.35 u[IU]/mL (ref 0.35–5.50)

## 2010-09-08 ENCOUNTER — Ambulatory Visit (INDEPENDENT_AMBULATORY_CARE_PROVIDER_SITE_OTHER): Payer: Medicare Other | Admitting: Cardiology

## 2010-09-08 DIAGNOSIS — E785 Hyperlipidemia, unspecified: Secondary | ICD-10-CM

## 2010-09-08 DIAGNOSIS — R0602 Shortness of breath: Secondary | ICD-10-CM

## 2010-09-08 DIAGNOSIS — D649 Anemia, unspecified: Secondary | ICD-10-CM

## 2010-09-08 DIAGNOSIS — I10 Essential (primary) hypertension: Secondary | ICD-10-CM

## 2010-09-08 DIAGNOSIS — E039 Hypothyroidism, unspecified: Secondary | ICD-10-CM

## 2010-09-08 NOTE — Progress Notes (Signed)
Anna Adams Suit Date of Birth:  03-18-1934 [facility] [address]   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  She has a past history of hypercholesterolemia and a past history of asthma.  She is also hypothyroid and is on Synthroid replacement.  She has a history of essential hypertension.  Since last visit she's had no new cardiac symptoms.  She uses an occasional beer all inhaler but not on a regular basis she has not been expressing any chest pain and she had a normal nuclear stress test in 07/12/05 for preoperative clearance prior to major head and neck surgery.  She had a vigorous left ventricular ejection fraction of 85% on that study.  Current Outpatient Prescriptions  Medication Sig Dispense Refill  . albuterol (PROVENTIL) (5 MG/ML) 0.5% nebulizer solution Take 2.5 mg by nebulization every 6 (six) hours as needed.        Marland Kitchen atorvastatin (LIPITOR) 20 MG tablet Take 20 mg by mouth daily.        Marland Kitchen b complex vitamins tablet Take 1 tablet by mouth daily.        Marland Kitchen etodolac (LODINE XL) 400 MG 24 hr tablet Take 400 mg by mouth daily.        . famotidine (PEPCID) 20 MG tablet Take 20 mg by mouth daily.        Marland Kitchen glucosamine-chondroitin 500-400 MG tablet Take 1 tablet by mouth daily.        Marland Kitchen levothyroxine (SYNTHROID, LEVOTHROID) 150 MCG tablet Take 150 mcg by mouth daily.        Marland Kitchen losartan-hydrochlorothiazide (HYZAAR) 100-25 MG per tablet Take 1 tablet by mouth daily.        . Multiple Vitamins-Minerals (PX SENIOR VITAMIN PO) Take by mouth.        . vitamin E 200 UNIT capsule Take 200 Units by mouth daily.        . Fluticasone-Salmeterol (ADVAIR DISKUS) 250-50 MCG/DOSE AEPB Inhale 1 puff into the lungs every 12 (twelve) hours.        . Prenatal Vit-Fe Sulfate-FA (PRENATAL VITAMIN PO)  Take by mouth daily.          Allergies  Allergen Reactions  . Erythromycin   . Lufyllin-Gg     Patient Active Problem List  Diagnoses  . HYPOTHYROIDISM  . HYPERTENSION  . ALLERGIC RHINITIS  . INTRINSIC ASTHMA, UNSPECIFIED  . DYSPNEA    History  Smoking status  . Former Smoker  . Quit date: 09/01/1962  Smokeless tobacco  . Not on file    History  Alcohol Use No    Family History  Problem Relation Age of Onset  . Asthma Mother   . Hypertension Father     Review of Systems: Constitutional: no fever chills diaphoresis or fatigue or change in weight.  Head and neck: no hearing loss, no epistaxis, no photophobia or visual disturbance. Respiratory: No cough, shortness of breath or wheezing. Cardiovascular: No chest pain peripheral edema, palpitations. Gastrointestinal: No abdominal distention, no abdominal pain, no change in bowel habits hematochezia or melena. Genitourinary: No dysuria, no frequency, no urgency, no nocturia. Musculoskeletal:No arthralgias, no back pain, no gait disturbance or myalgias. Neurological: No dizziness, no headaches, no numbness, no seizures, no syncope, no  weakness, no tremors. Hematologic: No lymphadenopathy, no easy bruising. Psychiatric: No confusion, no hallucinations, no sleep disturbance.    Physical Exam: Filed Vitals:   09/08/10 1433  BP: 126/80  Pulse: 80  The general appearance reveals a well-developed well-nourished woman in no distress.  She refuses to weigh. The head and neck exam reveal some facial asymmetry as a result of having had previous parotid gland surgery.  Pupils are equal and reactive.  Sclerae clear.  Mouth and pharynx normal.  Jugular venous pressure normal carotids normal thyroid normal no lymphadenopathy.  The chest is clear to percussion and auscultation without significant wheezingThe precordium is quiet.  The first heart sound is normal.  The second heart sound is physiologically split.  There is no murmur  gallop rub or click.  There is no abnormal lift or heave.  The abdomen is soft and nontender. Bowel sounds are normal. The liver and spleen are not enlarged. There Are no abdominal masses. There are no bruits.  The pedal pulses are good.  There is no phlebitis or edema.  There is no cyanosis or clubbing.  Strength is normal and symmetrical in all extremities.  There is no lateralizing weakness.  There are no sensory deficits.  The skin is warm and dry.  There is no rash.     Assessment / Plan: The patient is to continue same medication.  She rechecked in 4 months for followup office visit and lab work.

## 2010-09-08 NOTE — Assessment & Plan Note (Signed)
Patient has a history of essential hypertension.  She's not had any dizziness or syncope or palpitations.  No chest pain or angina.  No headaches.

## 2010-09-08 NOTE — Assessment & Plan Note (Signed)
The patient remains clinically euthyroid on her present dose of Synthroid.  Although we have prescribed the generic Synthroid her insurance plan has actually been giving her the brand name Synthroid for which she is pleased

## 2010-09-08 NOTE — Assessment & Plan Note (Signed)
The patient has not been expressing any recent increase in dyspnea.  Several months ago she had a severe case of bronchitis with elevated white count and was very sick.  She fortunately did make a good complete recovery from that illness and is now back to baseline in terms of her breathing.

## 2010-09-17 ENCOUNTER — Other Ambulatory Visit: Payer: Self-pay | Admitting: *Deleted

## 2010-09-17 DIAGNOSIS — E039 Hypothyroidism, unspecified: Secondary | ICD-10-CM

## 2010-09-17 DIAGNOSIS — E785 Hyperlipidemia, unspecified: Secondary | ICD-10-CM

## 2010-09-17 MED ORDER — LEVOTHYROXINE SODIUM 150 MCG PO TABS: 150.0000 ug | ORAL_TABLET | Freq: Every day | ORAL | Status: DC

## 2010-09-17 MED ORDER — ATORVASTATIN CALCIUM 20 MG PO TABS: 20.0000 mg | ORAL_TABLET | Freq: Every day | ORAL | Status: DC

## 2010-09-17 NOTE — Telephone Encounter (Signed)
Requested.

## 2011-01-08 ENCOUNTER — Encounter: Payer: Self-pay | Admitting: Cardiology

## 2011-01-08 ENCOUNTER — Other Ambulatory Visit: Payer: Self-pay | Admitting: *Deleted

## 2011-01-08 ENCOUNTER — Other Ambulatory Visit: Payer: Medicare Other | Admitting: *Deleted

## 2011-01-08 ENCOUNTER — Ambulatory Visit (INDEPENDENT_AMBULATORY_CARE_PROVIDER_SITE_OTHER): Payer: Medicare Other | Admitting: *Deleted

## 2011-01-08 ENCOUNTER — Ambulatory Visit (INDEPENDENT_AMBULATORY_CARE_PROVIDER_SITE_OTHER): Payer: Medicare Other | Admitting: Cardiology

## 2011-01-08 VITALS — BP 128/86 | HR 72 | Ht 61.0 in | Wt 182.0 lb

## 2011-01-08 DIAGNOSIS — D649 Anemia, unspecified: Secondary | ICD-10-CM

## 2011-01-08 DIAGNOSIS — M1990 Unspecified osteoarthritis, unspecified site: Secondary | ICD-10-CM | POA: Insufficient documentation

## 2011-01-08 DIAGNOSIS — R1310 Dysphagia, unspecified: Secondary | ICD-10-CM

## 2011-01-08 DIAGNOSIS — E7800 Pure hypercholesterolemia, unspecified: Secondary | ICD-10-CM

## 2011-01-08 DIAGNOSIS — R0602 Shortness of breath: Secondary | ICD-10-CM

## 2011-01-08 DIAGNOSIS — E039 Hypothyroidism, unspecified: Secondary | ICD-10-CM

## 2011-01-08 DIAGNOSIS — I119 Hypertensive heart disease without heart failure: Secondary | ICD-10-CM

## 2011-01-08 DIAGNOSIS — J45909 Unspecified asthma, uncomplicated: Secondary | ICD-10-CM

## 2011-01-08 DIAGNOSIS — E785 Hyperlipidemia, unspecified: Secondary | ICD-10-CM

## 2011-01-08 DIAGNOSIS — I10 Essential (primary) hypertension: Secondary | ICD-10-CM

## 2011-01-08 LAB — CBC WITH DIFFERENTIAL/PLATELET
Basophils Absolute: 0 10*3/uL (ref 0.0–0.1)
HCT: 38 % (ref 36.0–46.0)
Hemoglobin: 12.9 g/dL (ref 12.0–15.0)
Lymphs Abs: 1.3 10*3/uL (ref 0.7–4.0)
MCHC: 33.8 g/dL (ref 30.0–36.0)
MCV: 85.6 fl (ref 78.0–100.0)
Monocytes Absolute: 0.4 10*3/uL (ref 0.1–1.0)
Monocytes Relative: 7 % (ref 3.0–12.0)
Neutro Abs: 4.5 10*3/uL (ref 1.4–7.7)
Platelets: 224 10*3/uL (ref 150.0–400.0)
RDW: 13.2 % (ref 11.5–14.6)

## 2011-01-08 LAB — HEPATIC FUNCTION PANEL
ALT: 20 U/L (ref 0–35)
Bilirubin, Direct: 0.1 mg/dL (ref 0.0–0.3)
Total Bilirubin: 0.5 mg/dL (ref 0.3–1.2)

## 2011-01-08 LAB — LIPID PANEL
HDL: 64.3 mg/dL (ref >39.00)
LDL Cholesterol: 82 mg/dL (ref 0–99)
Total CHOL/HDL Ratio: 3
VLDL: 22.4 mg/dL (ref 0.0–40.0)

## 2011-01-08 LAB — BASIC METABOLIC PANEL
BUN: 16 mg/dL (ref 6–23)
CO2: 31 mEq/L (ref 19–32)
Chloride: 102 mEq/L (ref 96–112)
Glucose, Bld: 98 mg/dL (ref 70–99)
Potassium: 4 mEq/L (ref 3.5–5.1)
Sodium: 139 mEq/L (ref 135–145)

## 2011-01-08 LAB — TSH: TSH: 0.12 u[IU]/mL — ABNORMAL LOW (ref 0.35–5.50)

## 2011-01-08 MED ORDER — ALBUTEROL SULFATE (5 MG/ML) 0.5% IN NEBU: 2.5000 mg | INHALATION_SOLUTION | Freq: Four times a day (QID) | RESPIRATORY_TRACT | Status: DC | PRN

## 2011-01-08 NOTE — Assessment & Plan Note (Signed)
The patient is clinically euthyroid on her present medication.  Blood work today is pending including TSH

## 2011-01-08 NOTE — Progress Notes (Signed)
Anna Adams Date of Birth:  1934/03/18 [facility] / [facility] [address]   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  She has a past history of essential hypertension and hypothyroidism and dyslipidemia.  She also has a history of anemia.  She has a known hiatal hernia.  She has a history of osteoarthritis.  Does not have any history of ischemic heart disease.  She had a normal nuclear stress test in June 2007 for preoperative clearance prior to major head and neck surgery.  Her ejection fraction on that study was 85%.  The patient has a history of asthma and has nebulizers on hand for when necessary use.  Current Outpatient Prescriptions  Medication Sig Dispense Refill  . atorvastatin (LIPITOR) 20 MG tablet Take 1 tablet (20 mg total) by mouth daily.  90 tablet  3  . b complex vitamins tablet Take 1 tablet by mouth daily.        . cholecalciferol (VITAMIN D) 1000 UNITS tablet Take 1,000 Units by mouth daily.        Marland Kitchen etodolac (LODINE XL) 400 MG 24 hr tablet Take 400 mg by mouth daily.        . famotidine (PEPCID) 20 MG tablet Take 20 mg by mouth daily.        Marland Kitchen glucosamine-chondroitin 500-400 MG tablet Take 1 tablet by mouth daily. As needed      . levothyroxine (SYNTHROID, LEVOTHROID) 150 MCG tablet Take 1 tablet (150 mcg total) by mouth daily.  90 tablet  3  . losartan-hydrochlorothiazide (HYZAAR) 100-25 MG per tablet Take 1 tablet by mouth daily.        . vitamin E 200 UNIT capsule Take 200 Units by mouth daily.        Marland Kitchen albuterol (PROVENTIL) (5 MG/ML) 0.5% nebulizer solution Take 0.5 mLs (2.5 mg total) by nebulization every 6 (six) hours as needed.  20 mL  prn    Allergies  Allergen Reactions  . Erythromycin   . Lufyllin-Gg   . Other     CT scan dye    Patient Active Problem List  Diagnoses  .  HYPOTHYROIDISM  . HYPERTENSION  . ALLERGIC RHINITIS  . INTRINSIC ASTHMA, UNSPECIFIED  . DYSPNEA    History  Smoking status  . Former Smoker  . Quit date: 09/01/1962  Smokeless tobacco  . Not on file    History  Alcohol Use No    Family History  Problem Relation Age of Onset  . Asthma Mother   . Hypertension Father     Review of Systems: Constitutional: no fever chills diaphoresis or fatigue or change in weight.  Head and neck: no hearing loss, no epistaxis, no photophobia or visual disturbance. Respiratory: No cough, shortness of breath or wheezing. Cardiovascular: No chest pain peripheral edema, palpitations. Gastrointestinal: No abdominal distention, no abdominal pain, no change in bowel habits hematochezia or melena. Genitourinary: No dysuria, no frequency, no urgency, no nocturia. Musculoskeletal:No arthralgias, no back pain, no gait disturbance or myalgias. Neurological: No dizziness, no headaches, no numbness, no seizures, no syncope, no weakness, no tremors. Hematologic: No lymphadenopathy, no easy bruising. Psychiatric: No confusion, no hallucinations, no sleep disturbance.    Physical Exam: Filed Vitals:   01/08/11 0917  BP: 128/86  Pulse: 72  on physical examination her general appearance is that of a well-developed well-nourished woman in no distress.Pupils equal and reactive.   Extraocular Movements are full.  There is no scleral icterus.  The mouth and pharynx are normal.  The neck is supple.  The carotids reveal no bruits.  The jugular venous pressure is normal.  The thyroid is not enlarged.  There is no lymphadenopathy.  The chest is clear to percussion and auscultation. There are no rales or rhonchi. Expansion of the chest is symmetrical.  The precordium is quiet.  The first heart sound is normal.  The second heart sound is physiologically split.  There is no murmur gallop rub or click.  There is no abnormal lift or heave.  The abdomen is soft and  nontender. Bowel sounds are normal. The liver and spleen are not enlarged. There Are no abdominal masses. There are no bruits.  The pedal pulses are good.  There is no phlebitis or edema.  There is no cyanosis or clubbing. Strength is normal and symmetrical in all extremities.  There is no lateralizing weakness.  There are no sensory deficits.  The skin is warm and dry.  There is no rash.    Assessment / Plan: Continue same medication.  If she has more trouble with pills getting stuck we may wish to have her see GI or possibly get a barium swallow.  Recheck in 4 months for followup office visit lipid panel hepatic function panel nasal metabolic panel TSH and CBC

## 2011-01-08 NOTE — Assessment & Plan Note (Signed)
Her asthma has been stable and she's not had to take any extra nebulizers recently

## 2011-01-08 NOTE — Assessment & Plan Note (Signed)
The patient is not having symptoms of congestive heart failure.  No fluid retention or orthopnea.

## 2011-01-08 NOTE — Assessment & Plan Note (Signed)
The patient has a history of osteoarthritis and is on Osteo Bi-Flex which is a four-month glucosamine.  Recently one of the glucosamine capsules [place] in her throat and gave her discomfort and difficulty swallowing for 5 days.  She put herself on Prevacid twice a day and eventually improved.  He has not been having any dysphagia for ordinary food such as bread or meat.  She does have a known large hiatal hernia

## 2011-01-08 NOTE — Patient Instructions (Signed)
Your physician wants you to follow-up in: 4 months You will receive a reminder letter in the mail two months in advance. If you don't receive a letter, please call our office to schedule the follow-up appointment.  Your physician recommends that you return for a FASTING lipid profile: 4 months hepatic/lipids/bmet/tsh/cbc  Your physician recommends that you continue on your current medications as directed. Please refer to the Current Medication list given to you today.

## 2011-01-08 NOTE — Telephone Encounter (Signed)
Refilled albuterol inh

## 2011-01-11 ENCOUNTER — Telehealth: Payer: Self-pay | Admitting: *Deleted

## 2011-01-11 ENCOUNTER — Telehealth: Payer: Self-pay | Admitting: Cardiology

## 2011-01-11 MED ORDER — ALBUTEROL SULFATE HFA 108 (90 BASE) MCG/ACT IN AERS: 2.0000 | INHALATION_SPRAY | Freq: Four times a day (QID) | RESPIRATORY_TRACT | Status: DC | PRN

## 2011-01-11 NOTE — Telephone Encounter (Signed)
Message copied by Burnell Blanks on Mon Jan 11, 2011  2:52 PM ------      Message from: [name]      Created: Fri Jan 08, 2011  7:40 PM       Lipids are normal. LFTs nl.BMET good.  CBC nl.      TSH low at 0.12 reflecting synthroid therapy.  We should repeat TSH at next OV and also get a FT4 next time.  If she is not feeling "hyper" continue same synthroid for now.

## 2011-01-11 NOTE — Telephone Encounter (Signed)
New msg  Pt called she said she got albuterol for nebulizer  She need inhaler-[facility] [address] Please let her know

## 2011-01-11 NOTE — Telephone Encounter (Signed)
Sent over new Rx

## 2011-01-11 NOTE — Telephone Encounter (Signed)
Feels ok, advised of labs

## 2011-02-15 ENCOUNTER — Other Ambulatory Visit: Payer: Self-pay | Admitting: Cardiology

## 2011-02-15 MED ORDER — LOSARTAN POTASSIUM-HCTZ 100-25 MG PO TABS: 1.0000 | ORAL_TABLET | Freq: Every day | ORAL | Status: DC

## 2011-02-15 MED ORDER — ETODOLAC ER 400 MG PO TB24: 400.0000 mg | ORAL_TABLET | Freq: Every day | ORAL | Status: DC

## 2011-02-15 NOTE — Telephone Encounter (Signed)
New Problem   Pateint is also requesting refill for the following medication which was not permitted in EPIC refill   etodolac (LODINE XL) 400 MG 24 hr tablet   Please refill the this medication.  Patient can be reached at home# for additional questions

## 2011-02-22 ENCOUNTER — Other Ambulatory Visit: Payer: Self-pay | Admitting: Cardiology

## 2011-02-22 MED ORDER — ETODOLAC ER 400 MG PO TB24: 400.0000 mg | ORAL_TABLET | Freq: Every day | ORAL | Status: DC

## 2011-02-22 NOTE — Telephone Encounter (Signed)
Rx for 90 day supply sent to pharmacy.

## 2011-02-22 NOTE — Telephone Encounter (Signed)
Pt wants to talk to melinda rx medication problem

## 2011-03-15 ENCOUNTER — Telehealth: Payer: Self-pay | Admitting: Cardiology

## 2011-03-15 MED ORDER — LOSARTAN POTASSIUM-HCTZ 100-25 MG PO TABS: 1.0000 | ORAL_TABLET | Freq: Every day | ORAL | Status: DC

## 2011-03-15 NOTE — Telephone Encounter (Signed)
Needed refills, done

## 2011-03-15 NOTE — Telephone Encounter (Signed)
New Problem  Patient would like a return phone call regarding meds.  She said this is not about a refill, she can be reached at hm#

## 2011-03-29 ENCOUNTER — Telehealth: Payer: Self-pay | Admitting: Cardiology

## 2011-03-29 MED ORDER — MECLIZINE HCL 25 MG PO TABS: 25.0000 mg | ORAL_TABLET | Freq: Three times a day (TID) | ORAL | Status: AC | PRN

## 2011-03-29 NOTE — Telephone Encounter (Signed)
Patient had some Meclizine and thinks it helped.  Will refill and advised to call back if no better or worse.  Patient did not want to be seen

## 2011-03-29 NOTE — Telephone Encounter (Signed)
New problem:  Patient calling C/O lightheadedness. No sob.

## 2011-03-29 NOTE — Telephone Encounter (Signed)
Okay to use meclizine

## 2011-04-26 ENCOUNTER — Other Ambulatory Visit: Payer: Medicare Other | Admitting: *Deleted

## 2011-04-28 ENCOUNTER — Encounter: Payer: Self-pay | Admitting: Cardiology

## 2011-04-28 ENCOUNTER — Ambulatory Visit (INDEPENDENT_AMBULATORY_CARE_PROVIDER_SITE_OTHER): Payer: Medicare Other | Admitting: Cardiology

## 2011-04-28 VITALS — BP 120/80 | HR 80 | Ht 62.0 in | Wt 185.0 lb

## 2011-04-28 DIAGNOSIS — M1990 Unspecified osteoarthritis, unspecified site: Secondary | ICD-10-CM | POA: Diagnosis not present

## 2011-04-28 DIAGNOSIS — E7800 Pure hypercholesterolemia, unspecified: Secondary | ICD-10-CM

## 2011-04-28 DIAGNOSIS — E039 Hypothyroidism, unspecified: Secondary | ICD-10-CM | POA: Diagnosis not present

## 2011-04-28 DIAGNOSIS — I119 Hypertensive heart disease without heart failure: Secondary | ICD-10-CM | POA: Insufficient documentation

## 2011-04-28 LAB — CBC WITH DIFFERENTIAL/PLATELET
Basophils Absolute: 0 10*3/uL (ref 0.0–0.1)
Basophils Relative: 0.2 % (ref 0.0–3.0)
Eosinophils Absolute: 0.2 10*3/uL (ref 0.0–0.7)
Lymphocytes Relative: 19.8 % (ref 12.0–46.0)
MCHC: 33.3 g/dL (ref 30.0–36.0)
MCV: 86.1 fl (ref 78.0–100.0)
Monocytes Absolute: 0.4 10*3/uL (ref 0.1–1.0)
Neutrophils Relative %: 70 % (ref 43.0–77.0)
Platelets: 206 10*3/uL (ref 150.0–400.0)
RBC: 4.57 Mil/uL (ref 3.87–5.11)
RDW: 13.8 % (ref 11.5–14.6)

## 2011-04-28 LAB — LIPID PANEL
HDL: 63.5 mg/dL (ref >39.00)
LDL Cholesterol: 85 mg/dL (ref 0–99)
Total CHOL/HDL Ratio: 3
Triglycerides: 102 mg/dL (ref 0.0–149.0)
VLDL: 20.4 mg/dL (ref 0.0–40.0)

## 2011-04-28 LAB — BASIC METABOLIC PANEL
CO2: 30 mEq/L (ref 19–32)
Chloride: 99 mEq/L (ref 96–112)
Potassium: 4.2 mEq/L (ref 3.5–5.1)
Sodium: 138 mEq/L (ref 135–145)

## 2011-04-28 LAB — HEPATIC FUNCTION PANEL: Albumin: 4 g/dL (ref 3.5–5.2)

## 2011-04-28 NOTE — Patient Instructions (Signed)
Will obtain labs today and call you with the results (lp/bmet/hfp/cbc/tsh)  Your physician recommends that you continue on your current medications as directed. Please refer to the Current Medication list given to you today.  Your physician wants you to follow-up in: 4 months You will receive a reminder letter in the mail two months in advance. If you don't receive a letter, please call our office to schedule the follow-up appointment.

## 2011-04-28 NOTE — Assessment & Plan Note (Signed)
The patient has a history of osteoarthritis.  She is on generic Lodine which has been effective for her.  The patient is planning to go on a trip to [place] in May and she will be doing a lot of walking at that time.  The patient has also discovered a similar Osteo Bi-Flex pill which is easier for her to swallow.

## 2011-04-28 NOTE — Progress Notes (Signed)
Anna Adams Date of Birth:  1934-08-07 [facility] [address] [place], [place]  [address] [phone]         Fax   [phone]  History of Present Illness: This pleasant 77 year old is seen for a scheduled four-month followup office visit.  She has a past history of essential hypertension and history of hypercholesterolemia.  She also has a history of asthma and hypothyroidism.  She's had problems with osteoarthritis.  7 past history of anemia and she does have a known hiatal hernia.  He does not have any history of ischemic heart disease and she had a normal nuclear stress test in June 2007.  Current Outpatient Prescriptions  Medication Sig Dispense Refill  . albuterol (PROAIR HFA) 108 (90 BASE) MCG/ACT inhaler Inhale 2 puffs into the lungs every 6 (six) hours as needed for wheezing.  8.5 g  prn  . atorvastatin (LIPITOR) 20 MG tablet Take 1 tablet (20 mg total) by mouth daily.  90 tablet  3  . b complex vitamins tablet Take 1 tablet by mouth daily.        . cholecalciferol (VITAMIN D) 1000 UNITS tablet Take 1,000 Units by mouth daily.        Marland Kitchen etodolac (LODINE XL) 400 MG 24 hr tablet Take 1 tablet (400 mg total) by mouth daily.  90 tablet  3  . famotidine (PEPCID) 20 MG tablet Take 20 mg by mouth daily.        Marland Kitchen glucosamine-chondroitin 500-400 MG tablet Take 1 tablet by mouth daily. As needed      . levothyroxine (SYNTHROID, LEVOTHROID) 150 MCG tablet Take 1 tablet (150 mcg total) by mouth daily.  90 tablet  3  . losartan-hydrochlorothiazide (HYZAAR) 100-25 MG per tablet Take 1 tablet by mouth daily.  90 tablet  3  . Multiple Vitamin (MULTIVITAMIN) tablet Take 1 tablet by mouth daily.      . vitamin E 200 UNIT capsule Take 200 Units by mouth daily.          Allergies  Allergen Reactions  . Erythromycin   . Lufyllin-Gg   . Mevacor (Lovastatin)     myalgias  . Other     CT scan dye    Patient Active Problem List  Diagnoses  . HYPOTHYROIDISM  .  HYPERTENSION  . ALLERGIC RHINITIS  . INTRINSIC ASTHMA, UNSPECIFIED  . DYSPNEA  . Osteoarthritis    History  Smoking status  . Former Smoker  . Quit date: 09/01/1962  Smokeless tobacco  . Not on file    History  Alcohol Use No    Family History  Problem Relation Age of Onset  . Asthma Mother   . Hypertension Father     Review of Systems: Constitutional: no fever chills diaphoresis or fatigue or change in weight.  Head and neck: no hearing loss, no epistaxis, no photophobia or visual disturbance. Respiratory: No cough, shortness of breath or wheezing. Cardiovascular: No chest pain peripheral edema, palpitations. Gastrointestinal: No abdominal distention, no abdominal pain, no change in bowel habits hematochezia or melena. Genitourinary: No dysuria, no frequency, no urgency, no nocturia. Musculoskeletal:No arthralgias, no back pain, no gait disturbance or myalgias. Neurological: No dizziness, no headaches, no numbness, no seizures, no syncope, no weakness, no tremors. Hematologic: No lymphadenopathy, no easy bruising. Psychiatric: No confusion, no hallucinations, no sleep disturbance.    Physical Exam: Filed Vitals:   04/28/11 0947  BP: 120/80  Pulse: 80   the general appearance reveals a  well-developed well-nourished woman in no distress.Pupils equal and reactive.   Extraocular Movements are full.  There is no scleral icterus.  The mouth and pharynx are normal.  The neck is supple.  The carotids reveal no bruits.  The jugular venous pressure is normal.  The thyroid is not enlarged.  There is no lymphadenopathy.  The chest is clear to percussion and auscultation. There are no rales or rhonchi. Expansion of the chest is symmetrical.  The precordium is quiet.  The first heart sound is normal.  The second heart sound is physiologically split.  There is no murmur gallop rub or click.  There is no abnormal lift or heave.  The abdomen is soft and nontender. Bowel sounds are  normal. The liver and spleen are not enlarged. There Are no abdominal masses. There are no bruits.  The pedal pulses are good.  There is no phlebitis.  There is trace dependent edema and she is wearing support hose. There is no cyanosis or clubbing. Strength is normal and symmetrical in all extremities.  There is no lateralizing weakness.  There are no sensory deficits.  The skin is warm and dry.  There is no rash.    Assessment / Plan: The patient is doing well.  She will stay on her same medication.  Continue careful diet.  She gained 3 pounds since her last saw her.  She will return in 4 months for followup office visit CBC and fasting lab work.

## 2011-04-28 NOTE — Assessment & Plan Note (Signed)
The patient is clinically euthyroid on her current dose of Synthroid 150 mcg daily.

## 2011-04-28 NOTE — Assessment & Plan Note (Signed)
The patient has a past history of essential hypertension.  She avoids dietary salt.  She does have to take nonsteroidal anti-inflammatories for her osteoarthritis.  She denies any chest pain or symptoms of congestive heart failure.  She does have shortness of breath related to her asthma

## 2011-04-28 NOTE — Progress Notes (Signed)
Quick Note:  Please report to patient. The recent labs are stable. Continue same medication and careful diet. The thyroid is stable. CBC shows Hgb okay at 13.1. Lipids and chemistries are good. ______

## 2011-05-03 DIAGNOSIS — H52209 Unspecified astigmatism, unspecified eye: Secondary | ICD-10-CM | POA: Diagnosis not present

## 2011-05-03 DIAGNOSIS — H259 Unspecified age-related cataract: Secondary | ICD-10-CM | POA: Diagnosis not present

## 2011-05-03 DIAGNOSIS — D3130 Benign neoplasm of unspecified choroid: Secondary | ICD-10-CM | POA: Diagnosis not present

## 2011-05-03 DIAGNOSIS — H5200 Hypermetropia, unspecified eye: Secondary | ICD-10-CM | POA: Diagnosis not present

## 2011-05-04 ENCOUNTER — Telehealth: Payer: Self-pay | Admitting: *Deleted

## 2011-05-04 NOTE — Telephone Encounter (Signed)
Advised of results

## 2011-05-04 NOTE — Telephone Encounter (Signed)
Message copied by Burnell Blanks on Tue May 04, 2011 10:24 AM ------      Message from: [name]      Created: Wed Apr 28, 2011  2:20 PM       Please report to patient.  The recent labs are stable. Continue same medication and careful diet. The thyroid is stable.  CBC shows Hgb okay at 13.1.  Lipids and chemistries are good.

## 2011-06-08 ENCOUNTER — Telehealth: Payer: Self-pay | Admitting: Cardiology

## 2011-06-08 MED ORDER — FAMOTIDINE 20 MG PO TABS: 20.0000 mg | ORAL_TABLET | Freq: Every day | ORAL | Status: DC

## 2011-06-08 NOTE — Telephone Encounter (Signed)
Pt has question and wants a call back

## 2011-06-08 NOTE — Telephone Encounter (Signed)
Refilled medications as requested 

## 2011-07-23 DIAGNOSIS — M79609 Pain in unspecified limb: Secondary | ICD-10-CM | POA: Diagnosis not present

## 2011-07-23 DIAGNOSIS — B351 Tinea unguium: Secondary | ICD-10-CM | POA: Diagnosis not present

## 2011-07-23 DIAGNOSIS — L608 Other nail disorders: Secondary | ICD-10-CM | POA: Diagnosis not present

## 2011-07-26 LAB — HM DIABETES FOOT EXAM

## 2011-09-03 ENCOUNTER — Ambulatory Visit (INDEPENDENT_AMBULATORY_CARE_PROVIDER_SITE_OTHER): Payer: Medicare Other | Admitting: Cardiology

## 2011-09-03 ENCOUNTER — Encounter: Payer: Self-pay | Admitting: Cardiology

## 2011-09-03 ENCOUNTER — Other Ambulatory Visit (INDEPENDENT_AMBULATORY_CARE_PROVIDER_SITE_OTHER): Payer: Medicare Other

## 2011-09-03 VITALS — BP 120/78 | HR 66 | Ht 62.0 in | Wt 183.0 lb

## 2011-09-03 DIAGNOSIS — I119 Hypertensive heart disease without heart failure: Secondary | ICD-10-CM

## 2011-09-03 DIAGNOSIS — E7800 Pure hypercholesterolemia, unspecified: Secondary | ICD-10-CM | POA: Diagnosis not present

## 2011-09-03 DIAGNOSIS — J45909 Unspecified asthma, uncomplicated: Secondary | ICD-10-CM | POA: Diagnosis not present

## 2011-09-03 DIAGNOSIS — E039 Hypothyroidism, unspecified: Secondary | ICD-10-CM

## 2011-09-03 DIAGNOSIS — M1990 Unspecified osteoarthritis, unspecified site: Secondary | ICD-10-CM

## 2011-09-03 LAB — HEPATIC FUNCTION PANEL
ALT: 16 U/L (ref 0–35)
AST: 23 U/L (ref 0–37)
Total Protein: 7.1 g/dL (ref 6.0–8.3)

## 2011-09-03 LAB — LIPID PANEL
Cholesterol: 161 mg/dL (ref 0–200)
HDL: 63.6 mg/dL (ref >39.00)
VLDL: 19.4 mg/dL (ref 0.0–40.0)

## 2011-09-03 LAB — BASIC METABOLIC PANEL
BUN: 20 mg/dL (ref 6–23)
Calcium: 10.4 mg/dL (ref 8.4–10.5)
GFR: 107.56 mL/min (ref >60.00)
Potassium: 4 mEq/L (ref 3.5–5.1)
Sodium: 138 mEq/L (ref 135–145)

## 2011-09-03 LAB — TSH: TSH: 0.24 u[IU]/mL — ABNORMAL LOW (ref 0.35–5.50)

## 2011-09-03 NOTE — Assessment & Plan Note (Signed)
Blood pressure has been remaining stable on current therapy.  She's not having any headaches or dizziness.  She does get regular exercise by going to water aerobics classes 3 times a week.

## 2011-09-03 NOTE — Assessment & Plan Note (Signed)
The patient has a strong osteoarthritis.  She does do better she takes Lodine once a day.  Her arthritic pain is worse at night.

## 2011-09-03 NOTE — Progress Notes (Signed)
Quick Note:  Please report to patient. The recent labs are stable. Continue same medication and careful diet. ______ 

## 2011-09-03 NOTE — Patient Instructions (Addendum)
Your physician recommends that you continue on your current medications as directed. Please refer to the Current Medication list given to you today. Your physician recommends that you schedule a follow-up appointment in: 4 months with fasting labs (lp/bmet/hfp/tsh)   

## 2011-09-03 NOTE — Assessment & Plan Note (Signed)
The patient has a past history of asthma.  She has not had any recent flareups.  She does use her pro air inhaler on a when necessary basis.  He has not had any recent purulent sputum fever or chills he is not having any chest pain.

## 2011-09-03 NOTE — Progress Notes (Signed)
Anna Adams Date of Birth:  Dec 29, 1934 [facility] [address] [place], [place]  [address] [phone]         Fax   [phone]  History of Present Illness: This pleasant 77 year old woman is seen for a followup four-month office visit.  She has a past history of essential hypertension and a history of hypercholesterolemia.  She also has a history of hypothyroidism and osteoarthritis.  She has a known large hiatal hernia.  She's had a problem with anemia in the past.  She does not have a history of ischemic heart disease and she had a normal nuclear stress test in June 2007.  Current Outpatient Prescriptions  Medication Sig Dispense Refill  . albuterol (PROAIR HFA) 108 (90 BASE) MCG/ACT inhaler Inhale 2 puffs into the lungs every 6 (six) hours as needed for wheezing.  8.5 g  prn  . atorvastatin (LIPITOR) 20 MG tablet Take 1 tablet (20 mg total) by mouth daily.  90 tablet  3  . b complex vitamins tablet Take 1 tablet by mouth daily.        . cholecalciferol (VITAMIN D) 1000 UNITS tablet Take 1,000 Units by mouth daily.        Marland Kitchen etodolac (LODINE XL) 400 MG 24 hr tablet Take 1 tablet (400 mg total) by mouth daily.  90 tablet  3  . famotidine (PEPCID) 20 MG tablet Take 1 tablet (20 mg total) by mouth daily.  90 tablet  3  . glucosamine-chondroitin 500-400 MG tablet Take 1 tablet by mouth daily. As needed      . levothyroxine (SYNTHROID, LEVOTHROID) 150 MCG tablet Take 1 tablet (150 mcg total) by mouth daily.  90 tablet  3  . losartan-hydrochlorothiazide (HYZAAR) 100-25 MG per tablet Take 1 tablet by mouth daily.  90 tablet  3  . Multiple Vitamin (MULTIVITAMIN) tablet Take 1 tablet by mouth daily.      . vitamin E 200 UNIT capsule Take 200 Units by mouth daily.          Allergies  Allergen Reactions  . Dyphylline-Guaifenesin   . Erythromycin   . Mevacor (Lovastatin)     myalgias  . Other     CT scan dye    Patient Active Problem List  Diagnosis  .  HYPOTHYROIDISM  . HYPERTENSION  . ALLERGIC RHINITIS  . INTRINSIC ASTHMA, UNSPECIFIED  . DYSPNEA  . Osteoarthritis  . Benign hypertensive heart disease without heart failure    History  Smoking status  . Former Smoker  . Quit date: 09/01/1962  Smokeless tobacco  . Not on file    History  Alcohol Use No    Family History  Problem Relation Age of Onset  . Asthma Mother   . Hypertension Father     Review of Systems: Constitutional: no fever chills diaphoresis or fatigue or change in weight.  Head and neck: no hearing loss, no epistaxis, no photophobia or visual disturbance. Respiratory: No cough, shortness of breath or wheezing. Cardiovascular: No chest pain peripheral edema, palpitations. Gastrointestinal: No abdominal distention, no abdominal pain, no change in bowel habits hematochezia or melena. Genitourinary: No dysuria, no frequency, no urgency, no nocturia. Musculoskeletal:No arthralgias, no back pain, no gait disturbance or myalgias. Neurological: No dizziness, no headaches, no numbness, no seizures, no syncope, no weakness, no tremors. Hematologic: No lymphadenopathy, no easy bruising. Psychiatric: No confusion, no hallucinations, no sleep disturbance.    Physical Exam: Filed Vitals:   09/03/11 0919  BP: 120/78  Pulse: 66   the general appearance reveals a well-developed well-nourished woman in no distress.  He has lost 2 pounds since last visit.The head and neck exam reveals pupils equal and reactive.  Extraocular movements are full.  There is no scleral icterus.  The mouth and pharynx are normal.  The neck is supple.  The carotids reveal no bruits.  The jugular venous pressure is normal.  The  thyroid is not enlarged.  There is no lymphadenopathy.  The chest is clear to percussion and auscultation.  There are no rales or rhonchi.  Expansion of the chest is symmetrical.  The precordium is quiet.  The first heart sound is normal.  The second heart sound is  physiologically split.  There is no murmur gallop rub or click.  There is no abnormal lift or heave.  The abdomen is soft and nontender.  The bowel sounds are normal.  The liver and spleen are not enlarged.  There are no abdominal masses.  There are no abdominal bruits.  Extremities reveal good pedal pulses.  There is no phlebitis or edema.  There is no cyanosis or clubbing.  Strength is normal and symmetrical in all extremities.  There is no lateralizing weakness.  There are no sensory deficits.  The skin is warm and dry.  There is no rash.     Assessment / Plan: Continue on same medication.  Check in 4 months for followup office visit lipid panel hepatic function panel basal metabolic panel and TSH

## 2011-09-07 ENCOUNTER — Telehealth: Payer: Self-pay | Admitting: *Deleted

## 2011-09-07 NOTE — Telephone Encounter (Signed)
Mailed copy of labs and left message to call if any questions  

## 2011-09-07 NOTE — Telephone Encounter (Signed)
Message copied by Burnell Blanks on Tue Sep 07, 2011  4:17 PM ------      Message from: [name]      Created: Fri Sep 03, 2011  9:01 PM       Please report to patient.  The recent labs are stable. Continue same medication and careful diet.

## 2011-09-15 DIAGNOSIS — M79609 Pain in unspecified limb: Secondary | ICD-10-CM | POA: Diagnosis not present

## 2011-09-15 DIAGNOSIS — M2010 Hallux valgus (acquired), unspecified foot: Secondary | ICD-10-CM | POA: Diagnosis not present

## 2011-09-15 DIAGNOSIS — M722 Plantar fascial fibromatosis: Secondary | ICD-10-CM | POA: Diagnosis not present

## 2011-10-04 ENCOUNTER — Ambulatory Visit (INDEPENDENT_AMBULATORY_CARE_PROVIDER_SITE_OTHER): Payer: Medicare Other | Admitting: Internal Medicine

## 2011-10-04 ENCOUNTER — Encounter: Payer: Self-pay | Admitting: Internal Medicine

## 2011-10-04 VITALS — BP 138/88 | HR 70 | Temp 97.5°F | Ht 62.0 in | Wt 179.4 lb

## 2011-10-04 DIAGNOSIS — Z1231 Encounter for screening mammogram for malignant neoplasm of breast: Secondary | ICD-10-CM | POA: Diagnosis not present

## 2011-10-04 DIAGNOSIS — K219 Gastro-esophageal reflux disease without esophagitis: Secondary | ICD-10-CM | POA: Diagnosis not present

## 2011-10-04 DIAGNOSIS — I10 Essential (primary) hypertension: Secondary | ICD-10-CM

## 2011-10-04 DIAGNOSIS — Z780 Asymptomatic menopausal state: Secondary | ICD-10-CM

## 2011-10-04 DIAGNOSIS — E039 Hypothyroidism, unspecified: Secondary | ICD-10-CM

## 2011-10-04 NOTE — Patient Instructions (Signed)
It was good to see you today. We have reviewed your prior records including labs and tests today Medications reviewed, no changes at this time. Health Maintenance reviewed - all recommended immunizations and age-appropriate screenings are up-to-date or declined. we'll make referral for mammogram and bone density . Our office will contact you regarding appointment(s) once made. Please schedule followup in June 2014, call sooner if problems.

## 2011-10-04 NOTE — Progress Notes (Signed)
Subjective:    Patient ID: Anna Adams, female    DOB: 11-11-1934, 77 y.o.   MRN: [id number]  HPI New patient to me and our division, here today to establish primary care provider. Previously followed exclusively with Dr. Patty Sermons Reviewed chronic medical issues today:  Hypertension. On ARB/diuretic combination. the patient reports compliance with medication(s) as prescribed. Denies adverse side effects.  Dyslipidemia. On atorvastatin for same. the patient reports compliance with medication(s) as prescribed. Denies adverse side effects.  Hypothyroid. history of remote RAI (1978) - the patient reports compliance with medication(s) as prescribed. Denies adverse side effects.  osteoarthritis - s/p B TKR (L -2005, R - 2008). uses daily NSAID treatment -no joint swelling or current effusions.  Past Medical History  Diagnosis Date  . Hypertension   . Parotid tumor     L side, s/p resection 1980 and 2007  . History of renal calculi   . Hypothyroidism   . Asthma   . ALLERGIC RHINITIS   . Asthma   . Osteoarthritis    Family History  Problem Relation Age of Onset  . Asthma Mother   . Hypertension Father    History  Substance Use Topics  . Smoking status: Former Smoker    Quit date: 09/01/1962  . Smokeless tobacco: Not on file   Comment: married, lives with spouse, retire Charity fundraiser  . Alcohol Use: No     Review of Systems Constitutional: Negative for fever or weight change.  Respiratory: Negative for cough and shortness of breath.   Cardiovascular: Negative for chest pain or palpitations.  Gastrointestinal: Negative for abdominal pain, no bowel changes.  Musculoskeletal: Negative for gait problem or joint swelling.  Skin: Negative for rash.  Neurological: Negative for dizziness or headache.  No other specific complaints in a complete review of systems (except as listed in HPI above).     Objective:   Physical Exam BP 138/88  Pulse 70  Temp 97.5 F (36.4 C) (Oral)  Ht 5'  2" (1.575 m)  Wt 179 lb 6.4 oz (81.375 kg)  BMI 32.81 kg/m2  SpO2 94% Wt Readings from Last 3 Encounters:  10/04/11 179 lb 6.4 oz (81.375 kg)  09/03/11 183 lb (83.008 kg)  04/28/11 185 lb (83.915 kg)   Constitutional: She is overweight, but appears well-developed and well-nourished. No distress.  HENT: Head: Normocephalic and atraumatic. Ears: B TMs ok, no erythema or effusion; Nose: Nose normal. Mouth/Throat: Oropharynx is clear and moist. No oropharyngeal exudate.  Eyes: Conjunctivae and EOM are normal. Pupils are equal, round, and reactive to light. No scleral icterus.  Neck: Thick. Normal range of motion. Neck supple. No JVD present. No thyromegaly present.  Cardiovascular: Normal rate, regular rhythm and normal heart sounds.  No murmur heard. No BLE edema. Pulmonary/Chest: Effort normal and breath sounds normal. No respiratory distress. She has no wheezes.  Abdominal: Soft. Bowel sounds are normal. She exhibits no distension. There is no tenderness. no masses Musculoskeletal: Status post bilateral total knee replacement. Normal range of motion, no joint effusions. No gross deformities Neurological: She is alert and oriented to person, place, and time. No cranial nerve deficit. Coordination normal.  Skin: Skin is warm and dry. No rash noted. No erythema.  Psychiatric: She has a normal mood and affect. Her behavior is normal. Judgment and thought content normal.   Lab Results  Component Value Date   WBC 6.3 04/28/2011   HGB 13.1 04/28/2011   HCT 39.4 04/28/2011   PLT 206.0 04/28/2011  GLUCOSE 91 09/03/2011   CHOL 161 09/03/2011   TRIG 97.0 09/03/2011   HDL 63.60 09/03/2011   LDLCALC 78 09/03/2011   ALT 16 09/03/2011   AST 23 09/03/2011   NA 138 09/03/2011   K 4.0 09/03/2011   CL 100 09/03/2011   CREATININE 0.6 09/03/2011   BUN 20 09/03/2011   CO2 31 09/03/2011   TSH 0.24* 09/03/2011        Assessment & Plan:  See problem list. Medications and labs reviewed today.  Post menopause status. LMP 1988.  Check DEXA now, but declines calcium because of history kidney stone. Continue weightbearing exercise activity and vitamin D as ongoing

## 2011-10-04 NOTE — Assessment & Plan Note (Signed)
Remote RAI 1970s The current medical regimen is effective;  continue present plan and medications. Lab Results  Component Value Date   TSH 0.24* 09/03/2011

## 2011-10-04 NOTE — Assessment & Plan Note (Signed)
BP Readings from Last 3 Encounters:  10/04/11 138/88  09/03/11 120/78  04/28/11 120/80   The current medical regimen is effective;  continue present plan and medications.

## 2011-10-04 NOTE — Assessment & Plan Note (Signed)
Prn symptoms despite HH- uses H2B with good control The current medical regimen is effective;  continue present plan and medications.

## 2011-10-15 DIAGNOSIS — M722 Plantar fascial fibromatosis: Secondary | ICD-10-CM | POA: Diagnosis not present

## 2011-10-15 DIAGNOSIS — M79609 Pain in unspecified limb: Secondary | ICD-10-CM | POA: Diagnosis not present

## 2011-10-15 DIAGNOSIS — L608 Other nail disorders: Secondary | ICD-10-CM | POA: Diagnosis not present

## 2011-10-25 ENCOUNTER — Telehealth: Payer: Self-pay | Admitting: Cardiology

## 2011-10-25 DIAGNOSIS — E039 Hypothyroidism, unspecified: Secondary | ICD-10-CM

## 2011-10-25 MED ORDER — LEVOTHYROXINE SODIUM 150 MCG PO TABS: 150.0000 ug | ORAL_TABLET | Freq: Every day | ORAL | Status: DC

## 2011-10-25 NOTE — Telephone Encounter (Signed)
Pt has questions regarding medication.

## 2011-10-25 NOTE — Telephone Encounter (Signed)
Refilled synthroid as requested.

## 2011-10-29 ENCOUNTER — Ambulatory Visit
Admission: RE | Admit: 2011-10-29 | Discharge: 2011-10-29 | Disposition: A | Discharge status: Home / Self Care | Payer: Medicare Other | Source: Ambulatory Visit | Attending: Internal Medicine | Admitting: Internal Medicine

## 2011-10-29 DIAGNOSIS — Z1231 Encounter for screening mammogram for malignant neoplasm of breast: Secondary | ICD-10-CM | POA: Diagnosis not present

## 2011-10-29 DIAGNOSIS — M949 Disorder of cartilage, unspecified: Secondary | ICD-10-CM | POA: Diagnosis not present

## 2011-10-29 DIAGNOSIS — Z780 Asymptomatic menopausal state: Secondary | ICD-10-CM

## 2011-11-01 ENCOUNTER — Encounter: Payer: Self-pay | Admitting: Internal Medicine

## 2011-11-01 DIAGNOSIS — M810 Age-related osteoporosis without current pathological fracture: Secondary | ICD-10-CM | POA: Insufficient documentation

## 2011-11-02 ENCOUNTER — Other Ambulatory Visit: Payer: Self-pay | Admitting: Internal Medicine

## 2011-11-02 DIAGNOSIS — R928 Other abnormal and inconclusive findings on diagnostic imaging of breast: Secondary | ICD-10-CM

## 2011-11-05 ENCOUNTER — Telehealth: Payer: Self-pay | Admitting: Internal Medicine

## 2011-11-05 NOTE — Telephone Encounter (Signed)
The pt called the triage line and is hoping to get the results of her bone density test.  Her callback number is [phone]

## 2011-11-05 NOTE — Telephone Encounter (Signed)
Left message for pt to callback office.  

## 2011-11-08 NOTE — Telephone Encounter (Signed)
Left message for pt to callback office.  

## 2011-11-09 NOTE — Telephone Encounter (Signed)
Pt informed of results of Bone Density and of MD's advisement. Pt states that she does not want to start Fosamax because she is afraid of bleeding. Pt states that she will discuss alternatives with MD at her next OV.

## 2011-11-10 ENCOUNTER — Other Ambulatory Visit: Payer: Medicare Other

## 2011-11-16 ENCOUNTER — Ambulatory Visit
Admission: RE | Admit: 2011-11-16 | Discharge: 2011-11-16 | Disposition: A | Discharge status: Home / Self Care | Payer: Medicare Other | Source: Ambulatory Visit | Attending: Internal Medicine | Admitting: Internal Medicine

## 2011-11-16 DIAGNOSIS — R928 Other abnormal and inconclusive findings on diagnostic imaging of breast: Secondary | ICD-10-CM | POA: Diagnosis not present

## 2011-11-22 DIAGNOSIS — Z23 Encounter for immunization: Secondary | ICD-10-CM | POA: Diagnosis not present

## 2012-01-07 ENCOUNTER — Ambulatory Visit (INDEPENDENT_AMBULATORY_CARE_PROVIDER_SITE_OTHER): Payer: Medicare Other | Admitting: Cardiology

## 2012-01-07 ENCOUNTER — Encounter: Payer: Self-pay | Admitting: Cardiology

## 2012-01-07 ENCOUNTER — Other Ambulatory Visit (INDEPENDENT_AMBULATORY_CARE_PROVIDER_SITE_OTHER): Payer: Medicare Other

## 2012-01-07 VITALS — BP 128/71 | HR 80 | Ht 60.0 in | Wt 179.0 lb

## 2012-01-07 DIAGNOSIS — E039 Hypothyroidism, unspecified: Secondary | ICD-10-CM

## 2012-01-07 DIAGNOSIS — I10 Essential (primary) hypertension: Secondary | ICD-10-CM | POA: Diagnosis not present

## 2012-01-07 DIAGNOSIS — I119 Hypertensive heart disease without heart failure: Secondary | ICD-10-CM | POA: Diagnosis not present

## 2012-01-07 DIAGNOSIS — J45909 Unspecified asthma, uncomplicated: Secondary | ICD-10-CM | POA: Diagnosis not present

## 2012-01-07 LAB — LIPID PANEL
Cholesterol: 150 mg/dL (ref 0–200)
HDL: 58.5 mg/dL (ref >39.00)
LDL Cholesterol: 76 mg/dL (ref 0–99)
Total CHOL/HDL Ratio: 3
Triglycerides: 77 mg/dL (ref 0.0–149.0)
VLDL: 15.4 mg/dL (ref 0.0–40.0)

## 2012-01-07 LAB — CBC WITH DIFFERENTIAL/PLATELET
Eosinophils Relative: 2.9 % (ref 0.0–5.0)
Lymphocytes Relative: 19.1 % (ref 12.0–46.0)
Monocytes Absolute: 0.5 10*3/uL (ref 0.1–1.0)
Monocytes Relative: 8 % (ref 3.0–12.0)
Neutrophils Relative %: 69.5 % (ref 43.0–77.0)
Platelets: 219 10*3/uL (ref 150.0–400.0)
WBC: 6.8 10*3/uL (ref 4.5–10.5)

## 2012-01-07 LAB — HEPATIC FUNCTION PANEL
AST: 32 U/L (ref 0–37)
Albumin: 4.4 g/dL (ref 3.5–5.2)
Alkaline Phosphatase: 93 U/L (ref 39–117)

## 2012-01-07 LAB — BASIC METABOLIC PANEL
CO2: 30 mEq/L (ref 19–32)
Chloride: 98 mEq/L (ref 96–112)
GFR: 86.5 mL/min (ref >60.00)
Glucose, Bld: 95 mg/dL (ref 70–99)
Potassium: 3.8 mEq/L (ref 3.5–5.1)
Sodium: 137 mEq/L (ref 135–145)

## 2012-01-07 NOTE — Assessment & Plan Note (Signed)
The patient is clinically euthyroid. 

## 2012-01-07 NOTE — Progress Notes (Signed)
Sherlean Foot Branton Date of Birth:  January 24, 1935 [facility] [address] [place], [place]  [address] [phone]         Fax   [phone]  History of Present Illness: This pleasant 77 year old woman is seen for a followup four-month office visit. She has a past history of essential hypertension and a history of hypercholesterolemia. She also has a history of hypothyroidism and osteoarthritis. She has a known large hiatal hernia. She's had a problem with anemia in the past. She does not have a history of ischemic heart disease and she had a normal nuclear stress test in June 2007.  She had a recent bone density scan which showed osteoporosis.  She has a history of asthma and has had no recent episodes of acute respiratory distress.   Current Outpatient Prescriptions  Medication Sig Dispense Refill  . albuterol (PROAIR HFA) 108 (90 BASE) MCG/ACT inhaler Inhale 2 puffs into the lungs every 6 (six) hours as needed for wheezing.  8.5 g  prn  . atorvastatin (LIPITOR) 20 MG tablet Take 1 tablet (20 mg total) by mouth daily.  90 tablet  3  . b complex vitamins tablet Take 1 tablet by mouth daily.        . cholecalciferol (VITAMIN D) 1000 UNITS tablet Take 1,000 Units by mouth daily.        Marland Kitchen etodolac (LODINE XL) 400 MG 24 hr tablet Take 1 tablet (400 mg total) by mouth daily.  90 tablet  3  . famotidine (PEPCID) 20 MG tablet Take 1 tablet (20 mg total) by mouth daily.  90 tablet  3  . glucosamine-chondroitin 500-400 MG tablet Take 1 tablet by mouth daily. As needed      . levothyroxine (SYNTHROID, LEVOTHROID) 150 MCG tablet Take 1 tablet (150 mcg total) by mouth daily.  90 tablet  3  . losartan-hydrochlorothiazide (HYZAAR) 100-25 MG per tablet Take 1 tablet by mouth daily.  90 tablet  3  . Multiple Vitamin (MULTIVITAMIN) tablet Take 1 tablet by mouth daily.      . vitamin E 200 UNIT capsule Take 200 Units by mouth daily.          Allergies  Allergen Reactions  .  Dyphylline-Guaifenesin   . Erythromycin   . Mevacor (Lovastatin)     myalgias  . Other     CT scan dye    Patient Active Problem List  Diagnosis  . HYPOTHYROIDISM  . HYPERTENSION  . ALLERGIC RHINITIS  . INTRINSIC ASTHMA, UNSPECIFIED  . Osteoarthritis  . Benign hypertensive heart disease without heart failure  . Asthma  . GERD (gastroesophageal reflux disease)  . Osteoporosis, post-menopausal    History  Smoking status  . Former Smoker  . Quit date: 09/01/1962  Smokeless tobacco  . Not on file    Comment: married, lives with spouse, retire Charity fundraiser    History  Alcohol Use No    Family History  Problem Relation Age of Onset  . Asthma Mother   . Hypertension Father     Review of Systems: Constitutional: no fever chills diaphoresis or fatigue or change in weight.  Head and neck: no hearing loss, no epistaxis, no photophobia or visual disturbance. Respiratory: No cough, shortness of breath or wheezing. Cardiovascular: No chest pain peripheral edema, palpitations. Gastrointestinal: No abdominal distention, no abdominal pain, no change in bowel habits hematochezia or melena. Genitourinary: No dysuria, no frequency, no urgency, no nocturia. Musculoskeletal:No arthralgias, no back pain, no gait disturbance or  myalgias. Neurological: No dizziness, no headaches, no numbness, no seizures, no syncope, no weakness, no tremors. Hematologic: No lymphadenopathy, no easy bruising. Psychiatric: No confusion, no hallucinations, no sleep disturbance.    Physical Exam: Filed Vitals:   01/07/12 0957  BP: 128/71  Pulse: 80   general appearance reveals a well-developed well-nourished woman in no distress.The head and neck exam reveals pupils equal and reactive.  Extraocular movements are full.  There is no scleral icterus.  The mouth and pharynx are normal.  The neck is supple.  She has had previous parotid gland surgery on the left. The carotids reveal no bruits.  The jugular venous  pressure is normal.  The  thyroid is not enlarged.  There is no lymphadenopathy.  The chest is clear to percussion and auscultation.  There are no rales or rhonchi.  Expansion of the chest is symmetrical.  The precordium is quiet.  The first heart sound is normal.  The second heart sound is physiologically split.  There is no murmur gallop rub or click.  There is no abnormal lift or heave.  The abdomen is soft and nontender.  The bowel sounds are normal.  The liver and spleen are not enlarged.  There are no abdominal masses.  There are no abdominal bruits.  Extremities reveal good pedal pulses.  There is no phlebitis or edema.  There is no cyanosis or clubbing.  Strength is normal and symmetrical in all extremities.  There is no lateralizing weakness.  There are no sensory deficits.  The skin is warm and dry.  There is no rash.     Assessment / Plan: Continue same medication.  Blood work today pending.  We will see her at one-year intervals.  Return in one year for office visit EKG lipid panel hepatic function panel basal metabolic panel T4 and TSH

## 2012-01-07 NOTE — Assessment & Plan Note (Signed)
Blood pressure remaining stable on current therapy, no headaches or dizziness.

## 2012-01-07 NOTE — Patient Instructions (Addendum)
Will obtain labs today and call you with the results (lp/bmet/hfp/tsh/cbc)  Your physician recommends that you continue on your current medications as directed. Please refer to the Current Medication list given to you today.  Your physician wants you to follow-up in: 1 yr with fasting labsYou will receive a reminder letter in the mail two months in advance. If you don't receive a letter, please call our office to schedule the follow-up appointment.

## 2012-01-07 NOTE — Assessment & Plan Note (Signed)
She had a severe illness about a month ago characterized by aching and shaking chills but no cough or sputum production or exacerbation of wheezing. She did take the flu shot this year and states that her arm was erythematous for the next 10 days

## 2012-01-07 NOTE — Assessment & Plan Note (Signed)
The patient has not been any exertional chest pain or angina.  She is not palpitations.  No dizziness or syncope.  No increased exertional dyspnea.  [facility] which she enjoys very much.

## 2012-01-09 NOTE — Progress Notes (Signed)
Quick Note:  Please report to patient. The recent labs are stable. Continue same medication and careful diet. The lipids are good. The hemoglobin is 13.2 which is good. The serum calcium is borderline elevated at 10.6. The thyroid function is satisfactory so stay on the same dose of Synthroid. The liver tests are all normal ______

## 2012-01-10 ENCOUNTER — Telehealth: Payer: Self-pay | Admitting: *Deleted

## 2012-01-10 ENCOUNTER — Other Ambulatory Visit: Payer: Self-pay | Admitting: *Deleted

## 2012-01-10 DIAGNOSIS — E785 Hyperlipidemia, unspecified: Secondary | ICD-10-CM

## 2012-01-10 DIAGNOSIS — I119 Hypertensive heart disease without heart failure: Secondary | ICD-10-CM

## 2012-01-10 MED ORDER — ATORVASTATIN CALCIUM 20 MG PO TABS: 20.0000 mg | ORAL_TABLET | Freq: Every day | ORAL | Status: DC

## 2012-01-10 MED ORDER — LOSARTAN POTASSIUM-HCTZ 100-25 MG PO TABS: 1.0000 | ORAL_TABLET | Freq: Every day | ORAL | Status: DC

## 2012-01-10 NOTE — Telephone Encounter (Signed)
Advised patient of lab results  

## 2012-01-10 NOTE — Telephone Encounter (Signed)
Message copied by Burnell Blanks on Mon Jan 10, 2012  3:11 PM ------      Message from: [name]      Created: Sun Jan 09, 2012  1:14 PM       Please report to patient.  The recent labs are stable. Continue same medication and careful diet.  The lipids are good.  The hemoglobin is 13.2 which is good.  The serum calcium is borderline elevated at 10.6.  The thyroid function is satisfactory so stay on the same dose of Synthroid.  The liver tests are all normal

## 2012-01-21 DIAGNOSIS — M79609 Pain in unspecified limb: Secondary | ICD-10-CM | POA: Diagnosis not present

## 2012-01-21 DIAGNOSIS — L608 Other nail disorders: Secondary | ICD-10-CM | POA: Diagnosis not present

## 2012-01-21 DIAGNOSIS — B351 Tinea unguium: Secondary | ICD-10-CM | POA: Diagnosis not present

## 2012-02-17 ENCOUNTER — Other Ambulatory Visit: Payer: Self-pay | Admitting: *Deleted

## 2012-02-17 MED ORDER — ETODOLAC ER 400 MG PO TB24: 400.0000 mg | ORAL_TABLET | Freq: Every day | ORAL | Status: DC

## 2012-02-18 ENCOUNTER — Other Ambulatory Visit: Payer: Self-pay

## 2012-02-18 MED ORDER — ETODOLAC ER 400 MG PO TB24: 400.0000 mg | ORAL_TABLET | Freq: Every day | ORAL | Status: DC

## 2012-02-21 ENCOUNTER — Telehealth: Payer: Self-pay | Admitting: Internal Medicine

## 2012-02-21 ENCOUNTER — Ambulatory Visit (INDEPENDENT_AMBULATORY_CARE_PROVIDER_SITE_OTHER): Payer: Medicare Other | Admitting: Internal Medicine

## 2012-02-21 ENCOUNTER — Ambulatory Visit: Payer: Self-pay | Admitting: Internal Medicine

## 2012-02-21 ENCOUNTER — Encounter: Payer: Self-pay | Admitting: Internal Medicine

## 2012-02-21 VITALS — BP 134/78 | HR 77 | Temp 97.7°F | Ht 60.0 in | Wt 180.2 lb

## 2012-02-21 DIAGNOSIS — J45901 Unspecified asthma with (acute) exacerbation: Secondary | ICD-10-CM

## 2012-02-21 MED ORDER — AMOXICILLIN-POT CLAVULANATE 875-125 MG PO TABS: 1.0000 | ORAL_TABLET | Freq: Two times a day (BID) | ORAL | Status: DC

## 2012-02-21 MED ORDER — PREDNISONE 20 MG PO TABS: 20.0000 mg | ORAL_TABLET | Freq: Every day | ORAL | Status: DC

## 2012-02-21 MED ORDER — ALBUTEROL SULFATE HFA 108 (90 BASE) MCG/ACT IN AERS: 2.0000 | INHALATION_SPRAY | Freq: Four times a day (QID) | RESPIRATORY_TRACT | Status: DC | PRN

## 2012-02-21 NOTE — Telephone Encounter (Signed)
Patient Information:  Caller Name: Lyan  Phone: [phone]  Patient: Anna Adams, Anna Adams  Gender: Female  DOB: Jun 19, 1934  Age: 78 Years  PCP: N/A  Office Follow Up:  Does the office need to follow up with this patient?: No  Instructions For The Office: N/A   Symptoms  Reason For Call & Symptoms: Has had a cough/congestion on 1/22.  The sputum became yellow on 1/24.  No fever.  Reviewed Health History In EMR: Yes  Reviewed Medications In EMR: Yes  Reviewed Allergies In EMR: Yes  Reviewed Surgeries / Procedures: Yes  Date of Onset of Symptoms: 02/16/2012  Guideline(s) Used:  Cough  Asthma Attack  Disposition Per Guideline:   See Today in Office  Reason For Disposition Reached:   Patient wants to be seen  Advice Given:  N/A  Appointment Scheduled:  02/21/2012 11:00:00 Appointment Scheduled Provider:  Nicki Reaper

## 2012-02-21 NOTE — Progress Notes (Signed)
HPI  Anna Adams presents to the clinic today with c/o cold symptoms x 4 days. She is having trouble with cough and wheezing. She has been using her inhaler. She has some mild shortness of breath when laying down. She has taken Nyquil without relief. She does have a history of allergies and asthma. She has had sick contacts  Review of Systems      Past Medical History  Diagnosis Date  . Hypertension   . Parotid tumor     L side, s/p resection 1980 and 2007  . History of renal calculi   . Hypothyroidism   . Asthma   . ALLERGIC RHINITIS   . Asthma   . Osteoarthritis   . GERD (gastroesophageal reflux disease)     with HH    Family History  Problem Relation Age of Onset  . Asthma Mother   . Hypertension Father     History   Social History  . Marital Status: Married    Spouse Name: N/A    Number of Children: N/A  . Years of Education: N/A   Occupational History  . Not on file.   Social History Main Topics  . Smoking status: Former Smoker    Quit date: 09/01/1962  . Smokeless tobacco: Not on file     Comment: married, lives with spouse, retire Charity fundraiser  . Alcohol Use: No  . Drug Use: No  . Sexually Active: Not on file   Other Topics Concern  . Not on file   Social History Narrative  . No narrative on file    Allergies  Allergen Reactions  . Dyphylline-Guaifenesin   . Erythromycin   . Mevacor (Lovastatin)     myalgias  . Other     CT scan dye     Constitutional: Positive headache. Denies fatigue, fever or abrupt weight changes.  HEENT:  Positive sore throat. Denies eye redness, eye pain, pressure behind the eyes, facial pain, nasal congestion, ear pain, ringing in the ears, wax buildup, runny nose or bloody nose. Respiratory: Positive cough. Denies difficulty breathing or shortness of breath.  Cardiovascular: Denies chest pain, chest tightness, palpitations or swelling in the hands or feet.   No other specific complaints in a complete review of systems (except as  listed in HPI above).  Objective:   BP 134/78  Pulse 77  Temp 97.7 F (36.5 C) (Oral)  Ht 5' (1.524 m)  Wt 180 lb 4 oz (81.761 kg)  BMI 35.20 kg/m2  SpO2 95% Wt Readings from Last 3 Encounters:  02/21/12 180 lb 4 oz (81.761 kg)  01/07/12 179 lb (81.194 kg)  10/04/11 179 lb 6.4 oz (81.375 kg)     General: Appears her stated age, well developed, well nourished in NAD. HEENT: Head: normal shape and size; Eyes: sclera white, no icterus, conjunctiva pink, PERRLA and EOMs intact; Ears: Tm's gray and intact, normal light reflex; Nose: mucosa pink and moist, septum midline; Throat/Mouth: + PND. Teeth present, mucosa erythematous and moist, no exudate noted, no lesions or ulcerations noted.  Neck: Neck supple, trachea midline. No massses, lumps or thyromegaly present.  Cardiovascular: Normal rate and rhythm. S1,S2 noted.  No murmur, rubs or gallops noted. No JVD or BLE edema. No carotid bruits noted. Pulmonary/Chest: Normal effort and bilateral intermittent expiratory wheeze. No respiratory distress. No wheezes, rales or ronchi noted.      Assessment & Plan:  Acute asthma exacerbation, new onset with additional workup required:  Get some rest and drink plenty of  water Do salt water gargles for the sore throat eRx for Azithromax x 5 days Refilled inhaler Prednisone 20 mg x 7 days  RTC as needed or if symptoms persist.

## 2012-02-21 NOTE — Patient Instructions (Signed)
Asthma, Acute Bronchospasm  Your exam shows you have asthma, or acute bronchospasm that acts like asthma. Bronchospasm means your air passages become narrowed. These conditions are due to inflammation and airway spasm that cause narrowing of the bronchial tubes in the lungs. This causes you to have wheezing and shortness of breath.  CAUSES   Respiratory infections and allergies most often bring on these attacks. Smoking, air pollution, cold air, emotional upsets, and vigorous exercise can also bring them on.   TREATMENT    Treatment is aimed at making the narrowed airways larger. Mild asthma/bronchospasm is usually controlled with inhaled medicines. Albuterol is a common medicine that you breathe in to open spastic or narrowed airways. Some trade names for albuterol are Ventolin or Proventil. Steroid medicine is also used to reduce the inflammation when an attack is moderate or severe. Antibiotics (medications used to kill germs) are only used if a bacterial infection is present.   If you are pregnant and need to use Albuterol (Ventolin or Proventil), you can expect the baby to move more than usual shortly after the medicine is used.  HOME CARE INSTRUCTIONS    Rest.   Drink plenty of liquids. This helps the mucus to remain thin and easily coughed up. Do not use caffeine or alcohol.   Do not smoke. Avoid being exposed to second-hand smoke.   You play a critical role in keeping yourself in good health. Avoid exposure to things that cause you to wheeze. Avoid exposure to things that cause you to have breathing problems. Keep your medications up-to-date and available. Carefully follow your doctor's treatment plan.   When pollen or pollution is bad, keep windows closed and use an air conditioner go to places with air conditioning. If you are allergic to furry pets or birds, find new homes for them or keep them outside.   Take your medicine exactly as prescribed.   Asthma requires careful medical attention. See  your caregiver for follow-up as advised. If you are more than [redacted] weeks pregnant and you were prescribed any new medications, let your Obstetrician know about the visit and how you are doing. Arrange a recheck.  SEEK IMMEDIATE MEDICAL CARE IF:    You are getting worse.   You have trouble breathing. If severe, call 911.   You develop chest pain or discomfort.   You are throwing up or not drinking fluids.   You are not getting better within 24 hours.   You are coughing up yellow, green, brown, or bloody sputum.   You develop a fever over 102 F (38.9 C).   You have trouble swallowing.  MAKE SURE YOU:    Understand these instructions.   Will watch your condition.   Will get help right away if you are not doing well or get worse.  Document Released: 04/28/2006 Document Revised: 04/05/2011 Document Reviewed: 12/26/2006  [employer] Patient Information 2013 [employer].

## 2012-05-05 ENCOUNTER — Ambulatory Visit (INDEPENDENT_AMBULATORY_CARE_PROVIDER_SITE_OTHER): Payer: Medicare Other | Admitting: Podiatry

## 2012-05-05 ENCOUNTER — Encounter: Payer: Self-pay | Admitting: Podiatry

## 2012-05-05 VITALS — BP 125/75 | HR 68 | Ht 60.0 in | Wt 181.0 lb

## 2012-05-05 DIAGNOSIS — B351 Tinea unguium: Secondary | ICD-10-CM | POA: Diagnosis not present

## 2012-05-05 DIAGNOSIS — M25579 Pain in unspecified ankle and joints of unspecified foot: Secondary | ICD-10-CM

## 2012-05-05 NOTE — Progress Notes (Signed)
Subjective: 78 y.o. year old female patient presents complaining of painful nails. Patient requests toe nails, corns and calluses trimmed.   Review of Systems - General ROS: negative for - except for Hot flashes.  Ophthalmic ROS: negative ENT ROS: negative except sneezing from allergies. Allergy and Immunology ROS: Pollen allergies. Hematological and Lymphatic ROS: negative Endocrine ROS: Hypothyroidsm from over treatment on Hyperthyroidsm.  Breast ROS: negative for breast lumps Respiratory ROS: Asthma mild. Cardiovascular ROS: no chest pain or dyspnea on exertion Gastrointestinal ROS: GERD from Hyatal hernia. Genito-Urinary ROS: negative Musculoskeletal ROS: Arthritis general, both knees replaced. Neurological ROS: negative  Objective: Dermatologic: Thick yellow deformed nails x 10.  Positive of small digital corn 5th left. Vascular: Pedal pulses DP palpable bil, PT not palpable. Bilateral ankle edema L>R.  Orthopedic: Severe HAV with bunion. Contracted lesser digits. Neurologic: Decreased DTR and vibratory sensory perception. Normal monofilament sensory testing.   Assessment: Dystrophic mycotic nails x 10.  Treatment: All mycotic nails, corns, calluses debrided.  Return in 3 months or as needed.

## 2012-05-05 NOTE — Patient Instructions (Addendum)
Seen for Routine foot care. Trimmed all nails. Has bilateral ankle edema. May benefit from Compression socks. Return in 3 months.

## 2012-05-08 DIAGNOSIS — Q143 Congenital malformation of choroid: Secondary | ICD-10-CM | POA: Diagnosis not present

## 2012-05-08 DIAGNOSIS — H259 Unspecified age-related cataract: Secondary | ICD-10-CM | POA: Diagnosis not present

## 2012-05-08 DIAGNOSIS — H5200 Hypermetropia, unspecified eye: Secondary | ICD-10-CM | POA: Diagnosis not present

## 2012-05-19 ENCOUNTER — Other Ambulatory Visit: Payer: Self-pay | Admitting: *Deleted

## 2012-05-19 MED ORDER — FAMOTIDINE 20 MG PO TABS: 20.0000 mg | ORAL_TABLET | Freq: Every day | ORAL | Status: DC

## 2012-07-18 ENCOUNTER — Encounter: Payer: Self-pay | Admitting: Internal Medicine

## 2012-07-18 ENCOUNTER — Ambulatory Visit (INDEPENDENT_AMBULATORY_CARE_PROVIDER_SITE_OTHER): Payer: Medicare Other | Admitting: Internal Medicine

## 2012-07-18 VITALS — BP 122/80 | HR 71 | Temp 98.0°F | Wt 174.8 lb

## 2012-07-18 DIAGNOSIS — E785 Hyperlipidemia, unspecified: Secondary | ICD-10-CM

## 2012-07-18 DIAGNOSIS — I10 Essential (primary) hypertension: Secondary | ICD-10-CM | POA: Diagnosis not present

## 2012-07-18 DIAGNOSIS — E039 Hypothyroidism, unspecified: Secondary | ICD-10-CM

## 2012-07-18 DIAGNOSIS — M810 Age-related osteoporosis without current pathological fracture: Secondary | ICD-10-CM | POA: Diagnosis not present

## 2012-07-18 DIAGNOSIS — Z0000 Encounter for general adult medical examination without abnormal findings: Secondary | ICD-10-CM

## 2012-07-18 NOTE — Progress Notes (Signed)
  Subjective:    Patient ID: Anna Adams, female    DOB: November 14, 1934, 78 y.o.   MRN: [id number]  HPI  Patient presents for a 9 month follow up. Reports feeling well and has a few minor complaints at this visit.  Has notices fingernail weakness with frequent breaking, peeling, and a vertical lineation over the last several years. Has attempted nail hardener laquers with variable compliance. Wanted to acknowledge and ensure there wasn't anything worrisome at today's appointment.  Recent flare of chronic hemorrhoids. Painful with movement, noted a few days of bright red blood on toilet paper after wiping, has since dissipated. Applying a topical cooling gel for hemorrhoids which has helped.   Hypothyroidism Well controlled with medication, denies any new signs of symptoms.   HTN Well controlled with current medication, deniis any new signs or symptoms.   GERD Well controlled with use of a daily Pepcid, and an additional Pepcid PRN for breakthrough. Reports occasional breakthrough with reflux symptoms if her eating pattern changes.   Osteoporosis Previously diagnosed at last visit with a dexa scan. Denied wanting to begin Fosamax as she states she has a large hiatal hernia and has a tendency for gastrointestinal bleeding. Reports no symptoms of bone or joint pain and continues to insist that   Review of Systems     Objective:   Physical Exam        Assessment & Plan:

## 2012-07-18 NOTE — Patient Instructions (Signed)
It was good to see you today. We have reviewed your prior records including labs and tests today Health Maintenance reviewed - all recommended immunizations and age-appropriate screenings are up-to-date. Medications reviewed and updated, no changes recommended at this time. Please schedule followup in 9 months, call sooner if problems. Will check labs annually - or as needed for symptoms - call if problems!  Health Maintenance, Females A healthy lifestyle and preventative care can promote health and wellness.  Maintain regular health, dental, and eye exams.  Eat a healthy diet. Foods like vegetables, fruits, whole grains, low-fat dairy products, and lean protein foods contain the nutrients you need without too many calories. Decrease your intake of foods high in solid fats, added sugars, and salt. Get information about a proper diet from your caregiver, if necessary.  Regular physical exercise is one of the most important things you can do for your health. Most adults should get at least 150 minutes of moderate-intensity exercise (any activity that increases your heart rate and causes you to sweat) each week. In addition, most adults need muscle-strengthening exercises on 2 or more days a week.   Maintain a healthy weight. The body mass index (BMI) is a screening tool to identify possible weight problems. It provides an estimate of body fat based on height and weight. Your caregiver can help determine your BMI, and can help you achieve or maintain a healthy weight. For adults 20 years and older:  A BMI below 18.5 is considered underweight.  A BMI of 18.5 to 24.9 is normal.  A BMI of 25 to 29.9 is considered overweight.  A BMI of 30 and above is considered obese.  Maintain normal blood lipids and cholesterol by exercising and minimizing your intake of saturated fat. Eat a balanced diet with plenty of fruits and vegetables. Blood tests for lipids and cholesterol should begin at age 74 and be  repeated every 5 years. If your lipid or cholesterol levels are high, you are over 50, or you are a high risk for heart disease, you may need your cholesterol levels checked more frequently.Ongoing high lipid and cholesterol levels should be treated with medicines if diet and exercise are not effective.  If you smoke, find out from your caregiver how to quit. If you do not use tobacco, do not start.  If you are pregnant, do not drink alcohol. If you are breastfeeding, be very cautious about drinking alcohol. If you are not pregnant and choose to drink alcohol, do not exceed 1 drink per day. One drink is considered to be 12 ounces (355 mL) of beer, 5 ounces (148 mL) of wine, or 1.5 ounces (44 mL) of liquor.  Avoid use of street drugs. Do not share needles with anyone. Ask for help if you need support or instructions about stopping the use of drugs.  High blood pressure causes heart disease and increases the risk of stroke. Blood pressure should be checked at least every 1 to 2 years. Ongoing high blood pressure should be treated with medicines, if weight loss and exercise are not effective.  If you are 78 to 76 years old, ask your caregiver if you should take aspirin to prevent strokes.  Diabetes screening involves taking a blood sample to check your fasting blood sugar level. This should be done once every 3 years, after age 35, if you are within normal weight and without risk factors for diabetes. Testing should be considered at a younger age or be carried out more frequently  if you are overweight and have at least 1 risk factor for diabetes.  Breast cancer screening is essential preventative care for women. You should practice "breast self-awareness." This means understanding the normal appearance and feel of your breasts and may include breast self-examination. Any changes detected, no matter how small, should be reported to a caregiver. Women in their 48s and 30s should have a clinical breast exam  (CBE) by a caregiver as part of a regular health exam every 1 to 3 years. After age 80, women should have a CBE every year. Starting at age 60, women should consider having a mammogram (breast X-ray) every year. Women who have a family history of breast cancer should talk to their caregiver about genetic screening. Women at a high risk of breast cancer should talk to their caregiver about having an MRI and a mammogram every year.  The Pap test is a screening test for cervical cancer. Women should have a Pap test starting at age 59. Between ages 29 and 66, Pap tests should be repeated every 2 years. Beginning at age 50, you should have a Pap test every 3 years as long as the past 3 Pap tests have been normal. If you had a hysterectomy for a problem that was not cancer or a condition that could lead to cancer, then you no longer need Pap tests. If you are between ages 8 and 52, and you have had normal Pap tests going back 10 years, you no longer need Pap tests. If you have had past treatment for cervical cancer or a condition that could lead to cancer, you need Pap tests and screening for cancer for at least 20 years after your treatment. If Pap tests have been discontinued, risk factors (such as a new sexual partner) need to be reassessed to determine if screening should be resumed. Some women have medical problems that increase the chance of getting cervical cancer. In these cases, your caregiver may recommend more frequent screening and Pap tests.  The human papillomavirus (HPV) test is an additional test that may be used for cervical cancer screening. The HPV test looks for the virus that can cause the cell changes on the cervix. The cells collected during the Pap test can be tested for HPV. The HPV test could be used to screen women aged 71 years and older, and should be used in women of any age who have unclear Pap test results. After the age of 32, women should have HPV testing at the same frequency as a  Pap test.  Colorectal cancer can be detected and often prevented. Most routine colorectal cancer screening begins at the age of 40 and continues through age 39. However, your caregiver may recommend screening at an earlier age if you have risk factors for colon cancer. On a yearly basis, your caregiver may provide home test kits to check for hidden blood in the stool. Use of a small camera at the end of a tube, to directly examine the colon (sigmoidoscopy or colonoscopy), can detect the earliest forms of colorectal cancer. Talk to your caregiver about this at age 54, when routine screening begins. Direct examination of the colon should be repeated every 5 to 10 years through age 53, unless early forms of pre-cancerous polyps or small growths are found.  Hepatitis C blood testing is recommended for all people born from [birth year] through 1965 and any individual with known risks for hepatitis C.  Practice safe sex. Use condoms and avoid high-risk sexual  practices to reduce the spread of sexually transmitted infections (STIs). Sexually active women aged 19 and younger should be checked for Chlamydia, which is a common sexually transmitted infection. Older women with new or multiple partners should also be tested for Chlamydia. Testing for other STIs is recommended if you are sexually active and at increased risk.  Osteoporosis is a disease in which the bones lose minerals and strength with aging. This can result in serious bone fractures. The risk of osteoporosis can be identified using a bone density scan. Women ages 39 and over and women at risk for fractures or osteoporosis should discuss screening with their caregivers. Ask your caregiver whether you should be taking a calcium supplement or vitamin D to reduce the rate of osteoporosis.  Menopause can be associated with physical symptoms and risks. Hormone replacement therapy is available to decrease symptoms and risks. You should talk to your caregiver about  whether hormone replacement therapy is right for you.  Use sunscreen with a sun protection factor (SPF) of 30 or greater. Apply sunscreen liberally and repeatedly throughout the day. You should seek shade when your shadow is shorter than you. Protect yourself by wearing long sleeves, pants, a wide-brimmed hat, and sunglasses year round, whenever you are outdoors.  Notify your caregiver of new moles or changes in moles, especially if there is a change in shape or color. Also notify your caregiver if a mole is larger than the size of a pencil eraser.  Stay current with your immunizations. Document Released: 07/27/2010 Document Revised: 04/05/2011 Document Reviewed: 07/27/2010 [facility] Patient Information 2014 [place], [place]. Health Maintenance, Females A healthy lifestyle and preventative care can promote health and wellness.  Maintain regular health, dental, and eye exams.  Eat a healthy diet. Foods like vegetables, fruits, whole grains, low-fat dairy products, and lean protein foods contain the nutrients you need without too many calories. Decrease your intake of foods high in solid fats, added sugars, and salt. Get information about a proper diet from your caregiver, if necessary.  Regular physical exercise is one of the most important things you can do for your health. Most adults should get at least 150 minutes of moderate-intensity exercise (any activity that increases your heart rate and causes you to sweat) each week. In addition, most adults need muscle-strengthening exercises on 2 or more days a week.   Maintain a healthy weight. The body mass index (BMI) is a screening tool to identify possible weight problems. It provides an estimate of body fat based on height and weight. Your caregiver can help determine your BMI, and can help you achieve or maintain a healthy weight. For adults 20 years and older:  A BMI below 18.5 is considered underweight.  A BMI of 18.5 to 24.9 is normal.  A  BMI of 25 to 29.9 is considered overweight.  A BMI of 30 and above is considered obese.  Maintain normal blood lipids and cholesterol by exercising and minimizing your intake of saturated fat. Eat a balanced diet with plenty of fruits and vegetables. Blood tests for lipids and cholesterol should begin at age 65 and be repeated every 5 years. If your lipid or cholesterol levels are high, you are over 50, or you are a high risk for heart disease, you may need your cholesterol levels checked more frequently.Ongoing high lipid and cholesterol levels should be treated with medicines if diet and exercise are not effective.  If you smoke, find out from your caregiver how to quit. If you  do not use tobacco, do not start.  If you are pregnant, do not drink alcohol. If you are breastfeeding, be very cautious about drinking alcohol. If you are not pregnant and choose to drink alcohol, do not exceed 1 drink per day. One drink is considered to be 12 ounces (355 mL) of beer, 5 ounces (148 mL) of wine, or 1.5 ounces (44 mL) of liquor.  Avoid use of street drugs. Do not share needles with anyone. Ask for help if you need support or instructions about stopping the use of drugs.  High blood pressure causes heart disease and increases the risk of stroke. Blood pressure should be checked at least every 1 to 2 years. Ongoing high blood pressure should be treated with medicines, if weight loss and exercise are not effective.  If you are 65 to 76 years old, ask your caregiver if you should take aspirin to prevent strokes.  Diabetes screening involves taking a blood sample to check your fasting blood sugar level. This should be done once every 3 years, after age 75, if you are within normal weight and without risk factors for diabetes. Testing should be considered at a younger age or be carried out more frequently if you are overweight and have at least 1 risk factor for diabetes.  Breast cancer screening is essential  preventative care for women. You should practice "breast self-awareness." This means understanding the normal appearance and feel of your breasts and may include breast self-examination. Any changes detected, no matter how small, should be reported to a caregiver. Women in their 28s and 30s should have a clinical breast exam (CBE) by a caregiver as part of a regular health exam every 1 to 3 years. After age 70, women should have a CBE every year. Starting at age 13, women should consider having a mammogram (breast X-ray) every year. Women who have a family history of breast cancer should talk to their caregiver about genetic screening. Women at a high risk of breast cancer should talk to their caregiver about having an MRI and a mammogram every year.  The Pap test is a screening test for cervical cancer. Women should have a Pap test starting at age 65. Between ages 34 and 58, Pap tests should be repeated every 2 years. Beginning at age 83, you should have a Pap test every 3 years as long as the past 3 Pap tests have been normal. If you had a hysterectomy for a problem that was not cancer or a condition that could lead to cancer, then you no longer need Pap tests. If you are between ages 65 and 83, and you have had normal Pap tests going back 10 years, you no longer need Pap tests. If you have had past treatment for cervical cancer or a condition that could lead to cancer, you need Pap tests and screening for cancer for at least 20 years after your treatment. If Pap tests have been discontinued, risk factors (such as a new sexual partner) need to be reassessed to determine if screening should be resumed. Some women have medical problems that increase the chance of getting cervical cancer. In these cases, your caregiver may recommend more frequent screening and Pap tests.  The human papillomavirus (HPV) test is an additional test that may be used for cervical cancer screening. The HPV test looks for the virus that  can cause the cell changes on the cervix. The cells collected during the Pap test can be tested for HPV. The  HPV test could be used to screen women aged 12 years and older, and should be used in women of any age who have unclear Pap test results. After the age of 81, women should have HPV testing at the same frequency as a Pap test.  Colorectal cancer can be detected and often prevented. Most routine colorectal cancer screening begins at the age of 58 and continues through age 76. However, your caregiver may recommend screening at an earlier age if you have risk factors for colon cancer. On a yearly basis, your caregiver may provide home test kits to check for hidden blood in the stool. Use of a small camera at the end of a tube, to directly examine the colon (sigmoidoscopy or colonoscopy), can detect the earliest forms of colorectal cancer. Talk to your caregiver about this at age 55, when routine screening begins. Direct examination of the colon should be repeated every 5 to 10 years through age 54, unless early forms of pre-cancerous polyps or small growths are found.  Hepatitis C blood testing is recommended for all people born from [birth year] through 1965 and any individual with known risks for hepatitis C.  Practice safe sex. Use condoms and avoid high-risk sexual practices to reduce the spread of sexually transmitted infections (STIs). Sexually active women aged 52 and younger should be checked for Chlamydia, which is a common sexually transmitted infection. Older women with new or multiple partners should also be tested for Chlamydia. Testing for other STIs is recommended if you are sexually active and at increased risk.  Osteoporosis is a disease in which the bones lose minerals and strength with aging. This can result in serious bone fractures. The risk of osteoporosis can be identified using a bone density scan. Women ages 83 and over and women at risk for fractures or osteoporosis should discuss  screening with their caregivers. Ask your caregiver whether you should be taking a calcium supplement or vitamin D to reduce the rate of osteoporosis.  Menopause can be associated with physical symptoms and risks. Hormone replacement therapy is available to decrease symptoms and risks. You should talk to your caregiver about whether hormone replacement therapy is right for you.  Use sunscreen with a sun protection factor (SPF) of 30 or greater. Apply sunscreen liberally and repeatedly throughout the day. You should seek shade when your shadow is shorter than you. Protect yourself by wearing long sleeves, pants, a wide-brimmed hat, and sunglasses year round, whenever you are outdoors.  Notify your caregiver of new moles or changes in moles, especially if there is a change in shape or color. Also notify your caregiver if a mole is larger than the size of a pencil eraser.  Stay current with your immunizations. Document Released: 07/27/2010 Document Revised: 04/05/2011 Document Reviewed: 07/27/2010 [facility] Patient Information 2014 [place], [place].

## 2012-07-18 NOTE — Assessment & Plan Note (Signed)
Remote RAI 1970s The current medical regimen is effective;  continue present plan and medications. Lab Results  Component Value Date   TSH 0.22* 01/07/2012   TSH 0.19* 01/07/2012

## 2012-07-18 NOTE — Progress Notes (Signed)
Subjective:    Patient ID: Anna Adams, female    DOB: June 25, 1934, 78 y.o.   MRN: [id number]  HPI   Here for medicare wellness  Diet: heart healthy or DM if diabetic Physical activity: sedentary Depression/mood screen: negative Hearing: intact to whispered voice Visual acuity: grossly normal, performs annual eye exam  ADLs: capable Fall risk: none Home safety: good Cognitive evaluation: intact to orientation, naming, recall and repetition EOL planning: adv directives, full code/ I agree  I have personally reviewed and have noted 1. The patient's medical and social history 2. Their use of alcohol, tobacco or illicit drugs 3. Their current medications and supplements 4. The patient's functional ability including ADL's, fall risks, home safety risks and hearing or visual impairment. 5. Diet and physical activities 6. Evidence for depression or mood disorders  Also reviewed chronic medical issues today:  Hypertension. On ARB/diuretic combination. the patient reports compliance with medication(s) as prescribed. Denies adverse side effects.  Dyslipidemia. On atorvastatin for same. the patient reports compliance with medication(s) as prescribed. Denies adverse side effects.  Hypothyroid. history of remote RAI (1978) - the patient reports compliance with medication(s) as prescribed. Denies adverse side effects.  osteoarthritis - s/p B TKR (L -2005, R - 2008). uses daily NSAID treatment -no joint swelling or current effusions.  Past Medical History  Diagnosis Date  . Hypertension   . Parotid tumor     L side, s/p resection 1980 and 2007  . History of renal calculi   . Hypothyroidism   . Asthma   . ALLERGIC RHINITIS   . Asthma   . Osteoarthritis   . GERD (gastroesophageal reflux disease)     with HH   Family History  Problem Relation Age of Onset  . Asthma Mother   . Hypertension Father    History  Substance Use Topics  . Smoking status: Former Smoker    Quit date:  09/01/1962  . 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 Alcohol Use: No     Review of Systems  Constitutional: Negative for fever or weight change.  Respiratory: Negative for cough and shortness of breath.   Cardiovascular: Negative for chest pain or palpitations.  Gastrointestinal: Negative for abdominal pain, no bowel changes.  Musculoskeletal: Negative for gait problem or joint swelling.  Skin: Negative for rash.  Neurological: Negative for dizziness or headache.  No other specific complaints in a complete review of systems (except as listed in HPI above).     Objective:   Physical Exam  BP 122/80  Pulse 71  Temp(Src) 98 F (36.7 C) (Oral)  Wt 174 lb 12.8 oz (79.289 kg)  BMI 34.14 kg/m2  SpO2 94% Wt Readings from Last 3 Encounters:  07/18/12 174 lb 12.8 oz (79.289 kg)  05/05/12 181 lb (82.101 kg)  02/21/12 180 lb 4 oz (81.761 kg)   Constitutional: She is overweight, but appears well-developed and well-nourished. No distress.  Cardiovascular: Normal rate, regular rhythm and normal heart sounds.  No murmur heard. No BLE edema. Pulmonary/Chest: Effort normal and breath sounds normal. No respiratory distress. She has no wheezes.  Musculoskeletal: Status post bilateral total knee replacement. Normal range of motion, no joint effusions. No gross deformities Neurological: She is alert and oriented to person, place, and time. No cranial nerve deficit. Coordination normal.  Psychiatric: She has a normal mood and affect. Her behavior is normal. Judgment and thought content normal.   Lab Results  Component  Value Date   WBC 6.8 01/07/2012   HGB 13.2 01/07/2012   HCT 38.7 01/07/2012   PLT 219.0 01/07/2012   GLUCOSE 95 01/07/2012   CHOL 150 01/07/2012   TRIG 77.0 01/07/2012   HDL 58.50 01/07/2012   LDLCALC 76 01/07/2012   ALT 23 01/07/2012   AST 32 01/07/2012   NA 137 01/07/2012   K 3.8 01/07/2012   CL 98 01/07/2012   CREATININE  0.7 01/07/2012   BUN 19 01/07/2012   CO2 30 01/07/2012   TSH 0.22* 01/07/2012   TSH 0.19* 01/07/2012       Assessment & Plan:   AWV/v70.0 - Today patient counseled on age appropriate routine health concerns for screening and prevention, each reviewed and up to date or declined. Immunizations reviewed and up to date or declined. Prior labs/ECG reviewed. Risk factors for depression reviewed and negative. Hearing function and visual acuity are intact. ADLs screened and addressed as needed. Functional ability and level of safety reviewed and appropriate. Education, counseling and referrals performed based on assessed risks today. Patient provided with a copy of personalized plan for preventive services.  Also See problem list. Medications and labs reviewed today.

## 2012-07-18 NOTE — Assessment & Plan Note (Signed)
Post menopause status. LMP 1988.  declines calcium because of history kidney stone. Declines bisphos or Prolia as recommended fall 2013 No fall or fx hx Continue weightbearing exercise activity and vitamin D as ongoing

## 2012-07-18 NOTE — Assessment & Plan Note (Signed)
On statin, LDL <100 at goal - check annually The current medical regimen is effective;  continue present plan and medications.  

## 2012-07-18 NOTE — Assessment & Plan Note (Signed)
BP Readings from Last 3 Encounters:  07/18/12 122/80  05/05/12 125/75  02/21/12 134/78   The current medical regimen is effective;  continue present plan and medications.

## 2012-08-04 ENCOUNTER — Ambulatory Visit (INDEPENDENT_AMBULATORY_CARE_PROVIDER_SITE_OTHER): Payer: Medicare Other | Admitting: Podiatry

## 2012-08-04 DIAGNOSIS — B351 Tinea unguium: Secondary | ICD-10-CM | POA: Diagnosis not present

## 2012-08-04 DIAGNOSIS — M25579 Pain in unspecified ankle and joints of unspecified foot: Secondary | ICD-10-CM

## 2012-08-04 NOTE — Progress Notes (Signed)
Subjective:  78 y.o. year old female patient presents complaining of painful nails. Patient requests toe nails, corns and calluses trimmed.   Objective: Dermatologic: Thick yellow deformed nails x 10.  Positive of small digital corn 5th left.  Vascular: Pedal pulses DP palpable on right and left is not palpable. PT is palpable on both ankles.  Bilateral ankle edema L>R.  Orthopedic: Severe HAV with bunion. Contracted lesser digits.  Neurologic: Decreased DTR and vibratory sensory perception. Normal monofilament sensory testing.   Assessment:  Dystrophic mycotic nails x 10.   Treatment: All mycotic nails debrided.  Return in 3 months or as needed.

## 2012-09-05 ENCOUNTER — Other Ambulatory Visit: Payer: Self-pay

## 2012-09-05 DIAGNOSIS — E039 Hypothyroidism, unspecified: Secondary | ICD-10-CM

## 2012-09-05 MED ORDER — LEVOTHYROXINE SODIUM 150 MCG PO TABS: 150.0000 ug | ORAL_TABLET | Freq: Every day | ORAL | Status: DC

## 2012-09-07 ENCOUNTER — Other Ambulatory Visit: Payer: Self-pay | Admitting: *Deleted

## 2012-09-07 DIAGNOSIS — E039 Hypothyroidism, unspecified: Secondary | ICD-10-CM

## 2012-09-07 MED ORDER — LEVOTHYROXINE SODIUM 150 MCG PO TABS: 150.0000 ug | ORAL_TABLET | Freq: Every day | ORAL | Status: DC

## 2012-11-03 ENCOUNTER — Ambulatory Visit (INDEPENDENT_AMBULATORY_CARE_PROVIDER_SITE_OTHER): Payer: Medicare Other | Admitting: Podiatry

## 2012-11-03 ENCOUNTER — Encounter: Payer: Self-pay | Admitting: Podiatry

## 2012-11-03 VITALS — BP 129/83 | HR 66 | Ht 60.0 in | Wt 178.0 lb

## 2012-11-03 DIAGNOSIS — M25579 Pain in unspecified ankle and joints of unspecified foot: Secondary | ICD-10-CM

## 2012-11-03 DIAGNOSIS — B351 Tinea unguium: Secondary | ICD-10-CM

## 2012-11-03 NOTE — Progress Notes (Signed)
Subjective:  78 y.o. year old female patient presents complaining of painful nails. Patient requests toe nails, corns and calluses trimmed.   Objective: Dermatologic: Thick yellow deformed nails x 10.  Positive of plantar callus with intradermal bleeding sub 1 left foot. Vascular: Pedal pulses DP palpable on right and left is not palpable. PT is palpable on both ankles.  Orthopedic: Severe HAV with bunion. Contracted lesser digits.  Neurologic: Decreased DTR and vibratory sensory perception. Normal monofilament sensory testing.   Assessment:  Dystrophic mycotic nails x 10.  Plantar callus sub 1 left.  Treatment: All callus and mycotic nails debrided.  Return in 3 months or as needed.

## 2012-11-03 NOTE — Patient Instructions (Signed)
Seen for hypertrophic nails. All nails debrided. Return as needed.  

## 2012-11-15 ENCOUNTER — Other Ambulatory Visit: Payer: Self-pay

## 2012-11-15 DIAGNOSIS — Z1231 Encounter for screening mammogram for malignant neoplasm of breast: Secondary | ICD-10-CM

## 2012-11-15 DIAGNOSIS — Z23 Encounter for immunization: Secondary | ICD-10-CM | POA: Diagnosis not present

## 2012-11-30 ENCOUNTER — Other Ambulatory Visit: Payer: Self-pay

## 2012-12-05 ENCOUNTER — Other Ambulatory Visit: Payer: Self-pay | Admitting: Cardiology

## 2012-12-13 ENCOUNTER — Ambulatory Visit
Admission: RE | Admit: 2012-12-13 | Discharge: 2012-12-13 | Disposition: A | Discharge status: Home / Self Care | Payer: Medicare Other | Source: Ambulatory Visit

## 2012-12-13 DIAGNOSIS — Z1231 Encounter for screening mammogram for malignant neoplasm of breast: Secondary | ICD-10-CM

## 2013-01-10 DIAGNOSIS — H1030 Unspecified acute conjunctivitis, unspecified eye: Secondary | ICD-10-CM | POA: Diagnosis not present

## 2013-02-07 ENCOUNTER — Ambulatory Visit (INDEPENDENT_AMBULATORY_CARE_PROVIDER_SITE_OTHER): Payer: Medicare Other | Admitting: Cardiology

## 2013-02-07 ENCOUNTER — Other Ambulatory Visit: Payer: Medicare Other

## 2013-02-07 ENCOUNTER — Encounter: Payer: Self-pay | Admitting: Cardiology

## 2013-02-07 VITALS — BP 132/82 | HR 72 | Ht 60.0 in | Wt 177.0 lb

## 2013-02-07 DIAGNOSIS — E039 Hypothyroidism, unspecified: Secondary | ICD-10-CM

## 2013-02-07 DIAGNOSIS — I119 Hypertensive heart disease without heart failure: Secondary | ICD-10-CM | POA: Diagnosis not present

## 2013-02-07 DIAGNOSIS — K219 Gastro-esophageal reflux disease without esophagitis: Secondary | ICD-10-CM | POA: Diagnosis not present

## 2013-02-07 LAB — HEPATIC FUNCTION PANEL
ALBUMIN: 4 g/dL (ref 3.5–5.2)
ALT: 22 U/L (ref 0–35)
AST: 36 U/L (ref 0–37)
Alkaline Phosphatase: 96 U/L (ref 39–117)
BILIRUBIN TOTAL: 1 mg/dL (ref 0.3–1.2)
Bilirubin, Direct: 0.3 mg/dL (ref 0.0–0.3)
Total Protein: 7 g/dL (ref 6.0–8.3)

## 2013-02-07 LAB — CBC WITH DIFFERENTIAL/PLATELET
BASOS ABS: 0 10*3/uL (ref 0.0–0.1)
BASOS PCT: 0.4 % (ref 0.0–3.0)
EOS ABS: 0.2 10*3/uL (ref 0.0–0.7)
Eosinophils Relative: 3.1 % (ref 0.0–5.0)
HCT: 38.5 % (ref 36.0–46.0)
Hemoglobin: 13.1 g/dL (ref 12.0–15.0)
Lymphocytes Relative: 19.5 % (ref 12.0–46.0)
Lymphs Abs: 1.4 10*3/uL (ref 0.7–4.0)
MCHC: 34.2 g/dL (ref 30.0–36.0)
MCV: 83.6 fl (ref 78.0–100.0)
MONO ABS: 0.4 10*3/uL (ref 0.1–1.0)
Monocytes Relative: 6.3 % (ref 3.0–12.0)
NEUTROS PCT: 70.7 % (ref 43.0–77.0)
Neutro Abs: 4.9 10*3/uL (ref 1.4–7.7)
Platelets: 224 10*3/uL (ref 150.0–400.0)
RBC: 4.6 Mil/uL (ref 3.87–5.11)
RDW: 13.2 % (ref 11.5–14.6)
WBC: 7 10*3/uL (ref 4.5–10.5)

## 2013-02-07 LAB — BASIC METABOLIC PANEL
BUN: 17 mg/dL (ref 6–23)
CALCIUM: 10.3 mg/dL (ref 8.4–10.5)
CHLORIDE: 102 meq/L (ref 96–112)
CO2: 23 meq/L (ref 19–32)
Creatinine, Ser: 0.8 mg/dL (ref 0.4–1.2)
GFR: 70.86 mL/min (ref >60.00)
GLUCOSE: 99 mg/dL (ref 70–99)
POTASSIUM: 4.5 meq/L (ref 3.5–5.1)
SODIUM: 136 meq/L (ref 135–145)

## 2013-02-07 LAB — T4, FREE: Free T4: 1.35 ng/dL (ref 0.60–1.60)

## 2013-02-07 LAB — LIPID PANEL
CHOL/HDL RATIO: 3
Cholesterol: 173 mg/dL (ref 0–200)
HDL: 63.9 mg/dL (ref >39.00)
LDL Cholesterol: 86 mg/dL (ref 0–99)
TRIGLYCERIDES: 117 mg/dL (ref 0.0–149.0)
VLDL: 23.4 mg/dL (ref 0.0–40.0)

## 2013-02-07 LAB — TSH: TSH: 0.27 u[IU]/mL — ABNORMAL LOW (ref 0.35–5.50)

## 2013-02-07 NOTE — Assessment & Plan Note (Signed)
She is not having any symptoms from her hiatal hernia.  She has not had any evidence of GI bleeding.

## 2013-02-07 NOTE — Assessment & Plan Note (Signed)
Blood pressure was remaining stable on current medication.  Her weight is down 2 pounds since last visit.  She is watching her diet

## 2013-02-07 NOTE — Progress Notes (Signed)
Anna Adams Date of Birth:  1934/07/09 [place],   [address] [phone]         Fax   [phone]  History of Present Illness: This pleasant 79 year old woman is seen for a one-year followup office visit. She has a past history of essential hypertension and a history of hypercholesterolemia. She also has a history of hypothyroidism and osteoarthritis. She has a known large hiatal hernia. She's had a problem with anemia in the past. She does not have a history of ischemic heart disease and she had a normal nuclear stress test in June 2007.  She had a recent bone density scan which showed osteoporosis.  She has a history of asthma and has had no recent episodes of acute respiratory distress.  She gets exercise by going to go to the gym and doing water aerobics 3 times a week   Current Outpatient Prescriptions  Medication Sig Dispense Refill  . albuterol (PROVENTIL HFA;VENTOLIN HFA) 108 (90 BASE) MCG/ACT inhaler Inhale 2 puffs into the lungs every 6 (six) hours as needed.  1 Inhaler  1  . atorvastatin (LIPITOR) 20 MG tablet TAKE 1 TABLET DAILY  90 tablet  0  . b complex vitamins tablet Take 1 tablet by mouth daily.        . cholecalciferol (VITAMIN D) 1000 UNITS tablet Take 1,000 Units by mouth daily.        Marland Kitchen etodolac (LODINE XL) 400 MG 24 hr tablet Take 1 tablet (400 mg total) by mouth daily.  90 tablet  3  . famotidine (PEPCID) 20 MG tablet Take 1 tablet (20 mg total) by mouth daily.  90 tablet  3  . glucosamine-chondroitin 500-400 MG tablet Take 1 tablet by mouth daily. As needed      . levothyroxine (SYNTHROID, LEVOTHROID) 150 MCG tablet Take 1 tablet (150 mcg total) by mouth daily.  90 tablet  3  . losartan-hydrochlorothiazide (HYZAAR) 100-25 MG per tablet Take 1 tablet by mouth daily.  90 tablet  3  . Multiple Vitamin (MULTIVITAMIN) tablet Take 1 tablet by mouth daily.      . vitamin E 200 UNIT capsule Take 200 Units by mouth daily.         No  current facility-administered medications for this visit.    Allergies  Allergen Reactions  . Dyphylline-Guaifenesin   . Erythromycin   . Mevacor [Lovastatin]     myalgias  . Other     CT scan dye    Patient Active Problem List   Diagnosis Date Noted  . Dyslipidemia   . Onychomycosis due to dermatophyte 05/05/2012  . Pain in joint, ankle and foot 05/05/2012  . Osteoporosis, post-menopausal   . GERD (gastroesophageal reflux disease)   . Asthma 09/03/2011  . Benign hypertensive heart disease without heart failure 04/28/2011  . Osteoarthritis 01/08/2011  . HYPOTHYROIDISM 02/07/2009  . HYPERTENSION 02/07/2009  . ALLERGIC RHINITIS 02/07/2009  . INTRINSIC ASTHMA, UNSPECIFIED 02/07/2009    History  Smoking status  . Former Smoker  . Quit date: 09/01/1962  Smokeless tobacco  . Never Used    Comment: married, lives with spouse, retire Therapist, sports    History  Alcohol Use No    Family History  Problem Relation Age of Onset  . Asthma Mother   . Hypertension Father     Review of Systems: Constitutional: no fever chills diaphoresis or fatigue or change in weight.  Head and neck: no hearing loss, no epistaxis, no photophobia  or visual disturbance. Respiratory: No cough, shortness of breath or wheezing. Cardiovascular: No chest pain peripheral edema, palpitations. Gastrointestinal: No abdominal distention, no abdominal pain, no change in bowel habits hematochezia or melena. Genitourinary: No dysuria, no frequency, no urgency, no nocturia. Musculoskeletal:No arthralgias, no back pain, no gait disturbance or myalgias. Neurological: No dizziness, no headaches, no numbness, no seizures, no syncope, no weakness, no tremors. Hematologic: No lymphadenopathy, no easy bruising. Psychiatric: No confusion, no hallucinations, no sleep disturbance.    Physical Exam: Filed Vitals:   02/07/13 0836  BP: 132/82  Pulse: 72   general appearance reveals a well-developed well-nourished woman in  no distress.The head and neck exam reveals pupils equal and reactive.  Extraocular movements are full.  There is no scleral icterus.  The mouth and pharynx are normal.  The neck is supple.  She has had previous parotid gland surgery on the left. The carotids reveal no bruits.  The jugular venous pressure is normal.  The  thyroid is not enlarged.  There is no lymphadenopathy.  The chest is clear to percussion and auscultation.  There are no rales or rhonchi.  Expansion of the chest is symmetrical.  The precordium is quiet.  The first heart sound is normal.  The second heart sound is physiologically split.  There is no murmur gallop rub or click.  There is no abnormal lift or heave.  The abdomen is soft and nontender.  The bowel sounds are normal.  The liver and spleen are not enlarged.  There are no abdominal masses.  There are no abdominal bruits.  Extremities reveal good pedal pulses.  There is no phlebitis or edema.  There is no cyanosis or clubbing.  Strength is normal and symmetrical in all extremities.  There is no lateralizing weakness.  There are no sensory deficits.  The skin is warm and dry.  There is no rash.  EKG today shows normal sinus rhythm with left axis deviation and nonspecific T-wave flattening.  We have no old EKGs available for comparison.   Assessment / Plan: Continue same medication.  Blood work today pending.  We will see her at one-year intervals.  Return in one year for office visit EKG lipid panel hepatic function panel basal metabolic panel T4 and TSH

## 2013-02-07 NOTE — Progress Notes (Signed)
Quick Note:  Please report to patient. The recent labs are stable. Continue same medication and careful diet. Thyroid levels are good. ______ 

## 2013-02-07 NOTE — Patient Instructions (Signed)
Will obtain labs today and call you with the results (LP/BMET/HFP/TSH/FT4/CBC)  Your physician recommends that you continue on your current medications as directed. Please refer to the Current Medication list given to you today.  Your physician wants you to follow-up in: [place] will receive a reminder letter in the mail two months in advance. If you don't receive a letter, please call our office to schedule the follow-up appointment.

## 2013-02-07 NOTE — Assessment & Plan Note (Signed)
She is clinically euthyroid.  We're checking thyroid functions today

## 2013-02-09 ENCOUNTER — Ambulatory Visit (INDEPENDENT_AMBULATORY_CARE_PROVIDER_SITE_OTHER): Payer: Medicare Other | Admitting: Podiatry

## 2013-02-09 ENCOUNTER — Encounter: Payer: Self-pay | Admitting: Podiatry

## 2013-02-09 DIAGNOSIS — M25579 Pain in unspecified ankle and joints of unspecified foot: Secondary | ICD-10-CM | POA: Diagnosis not present

## 2013-02-09 DIAGNOSIS — B351 Tinea unguium: Secondary | ICD-10-CM | POA: Diagnosis not present

## 2013-02-09 NOTE — Progress Notes (Signed)
Subjective:  79 y.o. year old female patient presents complaining of painful nails. Patient requests toe nails trimmed.   Objective: Dermatologic: Thick yellow deformed nails x 10.  Vascular: Pedal pulses DP palpable on right and left is not palpable. PT is palpable on both ankles.  Orthopedic: Severe HAV with bunion. Contracted lesser digits.  Neurologic: Decreased DTR and vibratory sensory perception. Normal monofilament sensory testing.   Assessment:  Dystrophic mycotic nails x 10.   Treatment: All callus and mycotic nails debrided.  Return in 3 months or as needed.

## 2013-02-09 NOTE — Patient Instructions (Signed)
Seen for hypertrophic nails. All nails debrided. Return in 3 months or as needed.  

## 2013-03-05 ENCOUNTER — Telehealth: Payer: Self-pay | Admitting: *Deleted

## 2013-03-05 DIAGNOSIS — I119 Hypertensive heart disease without heart failure: Secondary | ICD-10-CM

## 2013-03-05 MED ORDER — ETODOLAC ER 400 MG PO TB24: 400.0000 mg | ORAL_TABLET | Freq: Every day | ORAL | Status: DC

## 2013-03-05 MED ORDER — LOSARTAN POTASSIUM-HCTZ 100-25 MG PO TABS: 1.0000 | ORAL_TABLET | Freq: Every day | ORAL | Status: DC

## 2013-03-05 MED ORDER — ATORVASTATIN CALCIUM 20 MG PO TABS: ORAL_TABLET | ORAL | Status: DC

## 2013-03-05 NOTE — Telephone Encounter (Signed)
Patient phoned requesting that the meds not sent to her mail order pharmacy be submitted.  The meds she was requesting were prescribed by her cardiologist.  Stated that PCP would not prescribe meds that she had not originally prescribed.  Patient stated PCP told her in last visit she would.  Transferred call to PCP's CMA.

## 2013-03-05 NOTE — Telephone Encounter (Signed)
Notified pt to verify msg. Pt needing refill on her etodolac, losartan, and lipitor inform pt will send to [facility]...Johny Chess

## 2013-03-22 ENCOUNTER — Ambulatory Visit: Payer: Medicare Other | Admitting: Internal Medicine

## 2013-03-23 ENCOUNTER — Ambulatory Visit (INDEPENDENT_AMBULATORY_CARE_PROVIDER_SITE_OTHER): Payer: Medicare Other | Admitting: Internal Medicine

## 2013-03-23 ENCOUNTER — Encounter: Payer: Self-pay | Admitting: Internal Medicine

## 2013-03-23 VITALS — BP 142/82 | HR 80 | Temp 98.1°F | Wt 180.6 lb

## 2013-03-23 DIAGNOSIS — M278 Other specified diseases of jaws: Secondary | ICD-10-CM

## 2013-03-23 DIAGNOSIS — E785 Hyperlipidemia, unspecified: Secondary | ICD-10-CM | POA: Diagnosis not present

## 2013-03-23 DIAGNOSIS — I10 Essential (primary) hypertension: Secondary | ICD-10-CM | POA: Diagnosis not present

## 2013-03-23 DIAGNOSIS — R220 Localized swelling, mass and lump, head: Secondary | ICD-10-CM

## 2013-03-23 DIAGNOSIS — D490 Neoplasm of unspecified behavior of digestive system: Secondary | ICD-10-CM

## 2013-03-23 DIAGNOSIS — E039 Hypothyroidism, unspecified: Secondary | ICD-10-CM

## 2013-03-23 DIAGNOSIS — R221 Localized swelling, mass and lump, neck: Secondary | ICD-10-CM

## 2013-03-23 NOTE — Progress Notes (Signed)
Subjective:    Patient ID: Anna Adams, female    DOB: [birth year], 79 y.o.   MRN: [id number]  HPI  Here for follow up - reviewed chronic medical issues and interval medical events:  Hypertension. On ARB/diuretic combination. the patient reports compliance with medication(s) as prescribed. Denies adverse side effects.  Dyslipidemia. On atorvastatin for same. the patient reports compliance with medication(s) as prescribed. Denies adverse side effects.  Hypothyroid. history of remote RAI (1978) - the patient reports compliance with medication(s) as prescribed. Denies adverse side effects.  osteoarthritis - s/p B TKR (L -2005, R - 2008). uses daily NSAID treatment -no joint swelling or current effusions.  Also notes increasing growth of her left-sided jaw for the past 2 weeks. Nontender. No trouble swallowing. No dental pain or recent dental work. Concerned because of history of prior left parotid surgery x2  Past Medical History  Diagnosis Date  . Hypertension   . Parotid tumor     L side, s/p resection 1980 and 2007  . History of renal calculi   . Hypothyroidism   . Asthma   . ALLERGIC RHINITIS   . Asthma   . Osteoarthritis   . GERD (gastroesophageal reflux disease)     with HH    Review of Systems  Constitutional: Negative for fatigue and unexpected weight change.  Respiratory: Negative for cough, shortness of breath and wheezing.   Cardiovascular: Negative for chest pain, palpitations and leg swelling.  Gastrointestinal: Negative for nausea, abdominal pain and diarrhea.  Neurological: Negative for dizziness, weakness, light-headedness and headaches.  Psychiatric/Behavioral: Negative for dysphoric mood. The patient is not nervous/anxious.         Objective:   Physical Exam BP 142/82  Pulse 80  Temp(Src) 98.1 F (36.7 C) (Oral)  Wt 180 lb 9.6 oz (81.92 kg)  SpO2 97% Wt Readings from Last 3 Encounters:  03/23/13 180 lb 9.6 oz (81.92 kg)  02/07/13 177 lb (80.287  kg)  11/03/12 178 lb (80.74 kg)   Constitutional: She is overweight, but appears well-developed and well-nourished. No distress.  HENT: 2 cm mobile cystic non tender mass over L mandible. Surgical changes, chronic over L parotid region. Cardiovascular: Normal rate, regular rhythm and normal heart sounds.  No murmur heard. No BLE edema. Pulmonary/Chest: Effort normal and breath sounds normal. No respiratory distress. She has no wheezes.  Musculoskeletal: Status post bilateral total knee replacement. Normal range of motion, no joint effusions. No gross deformities Neurological: She is alert and oriented to person, place, and time. No cranial nerve deficit. Coordination normal.  Psychiatric: She has a normal mood and affect. Her behavior is normal. Judgment and thought content normal.   Lab Results  Component Value Date   WBC 7.0 02/07/2013   HGB 13.1 02/07/2013   HCT 38.5 02/07/2013   PLT 224.0 02/07/2013   GLUCOSE 99 02/07/2013   CHOL 173 02/07/2013   TRIG 117.0 02/07/2013   HDL 63.90 02/07/2013   LDLCALC 86 02/07/2013   ALT 22 02/07/2013   AST 36 02/07/2013   NA 136 02/07/2013   K 4.5 02/07/2013   CL 102 02/07/2013   CREATININE 0.8 02/07/2013   BUN 17 02/07/2013   CO2 23 02/07/2013   TSH 0.27* 02/07/2013       Assessment & Plan:   L jaw "growth" - mobile, nontender, 2 cm cystic mass appreciable on exam over left mandible. Reports aware of same x 2 weeks. Given prior history of left parotid tumor with recurrence  and resection x2 (1980, 2007) will order soft tissue ultrasound to further evaluate. Consider MRI depending on characterization by ultrasound and refer back to ENT as needed [facility])  Problem List Items Addressed This Visit   Dyslipidemia     On statin, LDL <100 at goal - check annually The current medical regimen is effective;  continue present plan and medications.     HYPERTENSION - Primary      BP Readings from Last 3 Encounters:  03/23/13 142/82  02/07/13 132/82  11/03/12  129/83   The current medical regimen is effective;  continue present plan and medications.     HYPOTHYROIDISM      Remote RAI 1970s The current medical regimen is effective;  continue present plan and medications. Lab Results  Component Value Date   TSH 0.27* 02/07/2013

## 2013-03-23 NOTE — Assessment & Plan Note (Signed)
On statin, LDL <100 at goal - check annually The current medical regimen is effective;  continue present plan and medications.  

## 2013-03-23 NOTE — Progress Notes (Signed)
Pre-visit discussion using our clinic review tool. No additional management support is needed unless otherwise documented below in the visit note.  

## 2013-03-23 NOTE — Patient Instructions (Signed)
It was good to see you today.  We have reviewed your prior records including labs and tests today  Medications reviewed and updated, no changes recommended at this time.  we'll make referral for ultrasound to evaluate your jaw growth. Our office will contact you regarding appointment(s) once made. Your results will be released to [place] (or called to you) after review, usually within 72hours after test completion. If any changes need to be made, you will be notified at that same time.  Please schedule followup in 6 months, call sooner if problems.

## 2013-03-23 NOTE — Assessment & Plan Note (Signed)
BP Readings from Last 3 Encounters:  03/23/13 142/82  02/07/13 132/82  11/03/12 129/83   The current medical regimen is effective;  continue present plan and medications.

## 2013-03-23 NOTE — Assessment & Plan Note (Signed)
Remote RAI 1970s The current medical regimen is effective;  continue present plan and medications. Lab Results  Component Value Date   TSH 0.27* 02/07/2013

## 2013-03-28 ENCOUNTER — Other Ambulatory Visit: Payer: Self-pay | Admitting: Internal Medicine

## 2013-03-28 ENCOUNTER — Ambulatory Visit
Admission: RE | Admit: 2013-03-28 | Discharge: 2013-03-28 | Disposition: A | Discharge status: Home / Self Care | Payer: Medicare Other | Source: Ambulatory Visit | Attending: Internal Medicine | Admitting: Internal Medicine

## 2013-03-28 DIAGNOSIS — M278 Other specified diseases of jaws: Secondary | ICD-10-CM

## 2013-03-28 DIAGNOSIS — R220 Localized swelling, mass and lump, head: Secondary | ICD-10-CM

## 2013-03-28 DIAGNOSIS — D490 Neoplasm of unspecified behavior of digestive system: Secondary | ICD-10-CM

## 2013-03-28 DIAGNOSIS — M5382 Other specified dorsopathies, cervical region: Secondary | ICD-10-CM | POA: Diagnosis not present

## 2013-04-02 DIAGNOSIS — R599 Enlarged lymph nodes, unspecified: Secondary | ICD-10-CM | POA: Diagnosis not present

## 2013-04-19 ENCOUNTER — Other Ambulatory Visit: Payer: Self-pay | Admitting: *Deleted

## 2013-04-19 MED ORDER — FAMOTIDINE 20 MG PO TABS: 20.0000 mg | ORAL_TABLET | Freq: Every day | ORAL | Status: DC

## 2013-05-11 ENCOUNTER — Encounter: Payer: Self-pay | Admitting: Podiatry

## 2013-05-11 ENCOUNTER — Ambulatory Visit (INDEPENDENT_AMBULATORY_CARE_PROVIDER_SITE_OTHER): Payer: Medicare Other | Admitting: Podiatry

## 2013-05-11 VITALS — BP 104/60 | HR 82

## 2013-05-11 DIAGNOSIS — M79606 Pain in leg, unspecified: Secondary | ICD-10-CM

## 2013-05-11 DIAGNOSIS — M79609 Pain in unspecified limb: Secondary | ICD-10-CM | POA: Diagnosis not present

## 2013-05-11 DIAGNOSIS — B351 Tinea unguium: Secondary | ICD-10-CM | POA: Diagnosis not present

## 2013-05-11 NOTE — Progress Notes (Signed)
Subjective:  79 y.o. year old female patient presents complaining of painful nails.   Objective: Dermatologic: Thick yellow deformed nails x 10.  No acute skin lesions noted. Vascular: Pedal pulses DP palpable on right and left is not palpable. PT is palpable on both ankles.  Orthopedic: Severe HAV with bunion. Contracted lesser digits.  Neurologic: All epicritic and tactile sensations grossly intact.   Assessment:  Dystrophic mycotic nails x 10.   Treatment: All callus and mycotic nails debrided.  Return in 3 months or as needed.

## 2013-05-11 NOTE — Patient Instructions (Signed)
Seen for hypertrophic nails. All nails debrided. Return in 3 months or as needed.  

## 2013-06-07 ENCOUNTER — Other Ambulatory Visit: Payer: Self-pay | Admitting: Cardiology

## 2013-06-07 ENCOUNTER — Other Ambulatory Visit: Payer: Self-pay | Admitting: Internal Medicine

## 2013-08-10 ENCOUNTER — Ambulatory Visit: Payer: Medicare Other | Admitting: Podiatry

## 2013-08-24 ENCOUNTER — Encounter: Payer: Self-pay | Admitting: Podiatry

## 2013-08-24 ENCOUNTER — Ambulatory Visit (INDEPENDENT_AMBULATORY_CARE_PROVIDER_SITE_OTHER): Payer: Medicare Other | Admitting: Podiatry

## 2013-08-24 VITALS — BP 105/84 | HR 64

## 2013-08-24 DIAGNOSIS — M79609 Pain in unspecified limb: Secondary | ICD-10-CM | POA: Diagnosis not present

## 2013-08-24 DIAGNOSIS — M79606 Pain in leg, unspecified: Secondary | ICD-10-CM

## 2013-08-24 DIAGNOSIS — B351 Tinea unguium: Secondary | ICD-10-CM | POA: Diagnosis not present

## 2013-08-24 NOTE — Patient Instructions (Signed)
Seen for hypertrophic nails. All nails debrided. Return in 3 months or as needed.  

## 2013-08-24 NOTE — Progress Notes (Signed)
Subjective:  79 y.o. year old female patient presents complaining of painful nails.  Bunion is getting bad but does not hurt.   Objective: Dermatologic: Thick yellow deformed nails x 10.  No acute skin lesions noted.  Vascular: Pedal pulses DP palpable on right and left is not palpable. PT is palpable on both ankles.  Orthopedic: Severe HAV with bunion. Contracted lesser digits.  Neurologic: All epicritic and tactile sensations grossly intact.   Assessment:  Dystrophic mycotic nails x 10.  Severe HAV with bunions and hammer toes bilateral.   Treatment: All callus and mycotic nails debrided.  Return in 3 months or as needed.

## 2013-09-19 ENCOUNTER — Ambulatory Visit: Payer: Medicare Other | Admitting: Internal Medicine

## 2013-11-09 ENCOUNTER — Other Ambulatory Visit: Payer: Self-pay

## 2013-11-12 DIAGNOSIS — Z23 Encounter for immunization: Secondary | ICD-10-CM | POA: Diagnosis not present

## 2013-11-23 ENCOUNTER — Ambulatory Visit (INDEPENDENT_AMBULATORY_CARE_PROVIDER_SITE_OTHER): Payer: Medicare Other | Admitting: Podiatry

## 2013-11-23 ENCOUNTER — Encounter: Payer: Self-pay | Admitting: Podiatry

## 2013-11-23 VITALS — BP 123/79 | HR 69

## 2013-11-23 DIAGNOSIS — M79606 Pain in leg, unspecified: Secondary | ICD-10-CM | POA: Diagnosis not present

## 2013-11-23 DIAGNOSIS — B351 Tinea unguium: Secondary | ICD-10-CM | POA: Diagnosis not present

## 2013-11-23 NOTE — Progress Notes (Signed)
Subjective:  79 y.o. year old female patient presents complaining of painful nails.  Bunion is getting bad but does not want them fixed.   Objective: Dermatologic: Thick yellow deformed nails x 10.  No acute skin lesions noted.  Vascular: Pedal pulses DP palpable on right and left is not palpable. PT is palpable on both ankles.  Orthopedic: Severe HAV with bunion. Contracted lesser digits.  Neurologic: All epicritic and tactile sensations grossly intact.   Assessment:  Dystrophic mycotic nails x 10.  Severe HAV with bunions and hammer toes bilateral.   Treatment: All callus and mycotic nails debrided.  Return in 3 months or as needed.

## 2013-11-23 NOTE — Patient Instructions (Signed)
Seen for hypertrophic nails. All nails debrided. Return in 3 months or as needed.  

## 2013-11-26 ENCOUNTER — Ambulatory Visit (INDEPENDENT_AMBULATORY_CARE_PROVIDER_SITE_OTHER): Payer: Medicare Other | Admitting: Internal Medicine

## 2013-11-26 ENCOUNTER — Other Ambulatory Visit (INDEPENDENT_AMBULATORY_CARE_PROVIDER_SITE_OTHER): Payer: Medicare Other

## 2013-11-26 ENCOUNTER — Encounter: Payer: Self-pay | Admitting: Internal Medicine

## 2013-11-26 VITALS — BP 148/78 | HR 84 | Temp 97.9°F | Resp 18 | Ht 60.0 in | Wt 182.8 lb

## 2013-11-26 DIAGNOSIS — R609 Edema, unspecified: Secondary | ICD-10-CM

## 2013-11-26 DIAGNOSIS — E038 Other specified hypothyroidism: Secondary | ICD-10-CM | POA: Diagnosis not present

## 2013-11-26 LAB — CBC WITH DIFFERENTIAL/PLATELET
Basophils Absolute: 0 10*3/uL (ref 0.0–0.1)
Basophils Relative: 0.3 % (ref 0.0–3.0)
Eosinophils Absolute: 0.2 10*3/uL (ref 0.0–0.7)
Eosinophils Relative: 2.4 % (ref 0.0–5.0)
HCT: 42 % (ref 36.0–46.0)
Hemoglobin: 13.8 g/dL (ref 12.0–15.0)
LYMPHS PCT: 16.5 % (ref 12.0–46.0)
Lymphs Abs: 1.4 10*3/uL (ref 0.7–4.0)
MCHC: 32.7 g/dL (ref 30.0–36.0)
MCV: 87 fl (ref 78.0–100.0)
MONOS PCT: 8.2 % (ref 3.0–12.0)
Monocytes Absolute: 0.7 10*3/uL (ref 0.1–1.0)
NEUTROS PCT: 72.6 % (ref 43.0–77.0)
Neutro Abs: 6 10*3/uL (ref 1.4–7.7)
PLATELETS: 239 10*3/uL (ref 150.0–400.0)
RBC: 4.83 Mil/uL (ref 3.87–5.11)
RDW: 13.4 % (ref 11.5–15.5)
WBC: 8.3 10*3/uL (ref 4.0–10.5)

## 2013-11-26 LAB — BASIC METABOLIC PANEL
BUN: 20 mg/dL (ref 6–23)
CO2: 32 mEq/L (ref 19–32)
Calcium: 10.7 mg/dL — ABNORMAL HIGH (ref 8.4–10.5)
Chloride: 99 mEq/L (ref 96–112)
Creatinine, Ser: 0.7 mg/dL (ref 0.4–1.2)
GFR: 87.51 mL/min (ref >60.00)
Glucose, Bld: 76 mg/dL (ref 70–99)
Potassium: 4.1 mEq/L (ref 3.5–5.1)
Sodium: 136 mEq/L (ref 135–145)

## 2013-11-26 LAB — TSH: TSH: 0.43 u[IU]/mL (ref 0.35–4.50)

## 2013-11-26 MED ORDER — FUROSEMIDE 20 MG PO TABS: 20.0000 mg | ORAL_TABLET | Freq: Every day | ORAL | Status: DC | PRN

## 2013-11-26 MED ORDER — ALBUTEROL SULFATE HFA 108 (90 BASE) MCG/ACT IN AERS: 1.0000 | INHALATION_SPRAY | Freq: Four times a day (QID) | RESPIRATORY_TRACT | Status: DC | PRN

## 2013-11-26 NOTE — Assessment & Plan Note (Signed)
Continue compression hose as recommended by podiatry in 2013 Check 2d echo to evaluate LV fx - no other symptoms of CHF Minimize NSAIDs Control BP Lasix 20mg  qd prn Check labs

## 2013-11-26 NOTE — Assessment & Plan Note (Signed)
Remote RAI 1970s The current medical regimen is effective;  continue present plan and medications. Lab Results  Component Value Date   TSH 0.27* 02/07/2013

## 2013-11-26 NOTE — Progress Notes (Signed)
Pre visit review using our clinic review tool, if applicable. No additional management support is needed unless otherwise documented below in the visit note. 

## 2013-11-26 NOTE — Progress Notes (Signed)
Subjective:    Patient ID: Anna Adams, female    DOB: 11-Mar-1934, 79 y.o.   MRN: [id number]  HPI  Patient is here for follow up  Reviewed chronic medical issues and interval medical events  Past Medical History  Diagnosis Date  . Hypertension   . Parotid tumor     L side, s/p resection 1980 and 2007  . History of renal calculi   . Hypothyroidism   . Asthma   . ALLERGIC RHINITIS   . Asthma   . Osteoarthritis   . GERD (gastroesophageal reflux disease)     with HH    Review of Systems  Constitutional: Negative for fever and fatigue. Unexpected weight change: ?  Respiratory: Positive for shortness of breath (x 3 days) and wheezing (intermittent x 3 d).   Cardiovascular: Positive for leg swelling (B feet/ankles x 6 mo, L>R, improved with compression stocking - chronic). Negative for chest pain and palpitations.       Objective:   Physical Exam  BP 148/78 mmHg  Pulse 84  Temp(Src) 97.9 F (36.6 C) (Oral)  Resp 18  Ht 5' (1.524 m)  Wt 182 lb 12.8 oz (82.918 kg)  BMI 35.70 kg/m2  SpO2 95% Wt Readings from Last 3 Encounters:  11/26/13 182 lb 12.8 oz (82.918 kg)  03/23/13 180 lb 9.6 oz (81.92 kg)  02/07/13 177 lb (80.287 kg)   Constitutional: She is overweight, appears well-developed and well-nourished. No distress.  Neck: Thick. Normal range of motion. Neck supple. No JVD present. No thyromegaly present.  Cardiovascular: Normal rate, regular rhythm and normal heart sounds.  No murmur heard. 1+ BLE edema, wearing compression hose. Pulmonary/Chest: Effort normal and breath sounds normal. No respiratory distress. She has no wheezes.  Psychiatric: She has a normal mood and affect. Her behavior is normal. Judgment and thought content normal.   Lab Results  Component Value Date   WBC 7.0 02/07/2013   HGB 13.1 02/07/2013   HCT 38.5 02/07/2013   PLT 224.0 02/07/2013   GLUCOSE 99 02/07/2013   CHOL 173 02/07/2013   TRIG 117.0 02/07/2013   HDL 63.90 02/07/2013   LDLCALC 86 02/07/2013   ALT 22 02/07/2013   AST 36 02/07/2013   NA 136 02/07/2013   K 4.5 02/07/2013   CL 102 02/07/2013   CREATININE 0.8 02/07/2013   BUN 17 02/07/2013   CO2 23 02/07/2013   TSH 0.27* 02/07/2013    US Soft Tissue Head/neck  03/28/2013   CLINICAL DATA:  Palpable small nodule in the left submandibular region  EXAM: ULTRASOUND OF HEAD/NECK SOFT TISSUES  TECHNIQUE: Ultrasound examination of the head and neck soft tissues was performed in the area of clinical concern.  COMPARISON:  MR soft tissue neck of 06/26/2005  FINDINGS: At the site in question, there is a small 3 x 2 x 3 mm hypoechoic structure. This appears to demonstrate some through transmission and probably represents a small cyst or possibly a tiny lymph node. No blood flow is demonstrated within this hypoechoic structure.  IMPRESSION: 3 mm hypoechoic structure corresponds to the palpable abnormality and may represent a small cyst.   Electronically Signed   By: Ivar Drape M.D.   On: 03/28/2013 12:07       Assessment & Plan:   Problem List Items Addressed This Visit    Edema - Primary    Continue compression hose as recommended by podiatry in 2013 Check 2d echo to evaluate LV fx - no other symptoms  of CHF Minimize NSAIDs Control BP Lasix 20mg  qd prn Check labs    Relevant Orders      2D Echocardiogram without contrast      Basic metabolic panel      CBC with Differential      TSH   Hypothyroidism    Remote RAI 1970s The current medical regimen is effective;  continue present plan and medications. Lab Results  Component Value Date   TSH 0.27* 02/07/2013      Relevant Orders      2D Echocardiogram without contrast

## 2013-11-26 NOTE — Patient Instructions (Addendum)
It was good to see you today.  We have reviewed your prior records including labs and tests today  Test(s) ordered today. Your results will be released to [place] (or called to you) after review, usually within 72hours after test completion. If any changes need to be made, you will be notified at that same time.  we'll make referral to 2d echocardiogram to evaluate heart pump because of leg swelling. Our office will contact you regarding appointment(s) once made.  Medications reviewed and updated Use furosemide 20 mg daily as needed for swelling. No other changes recommended at this time Your prescription(s) have been submitted to your pharmacy. Please take as directed and contact our office if you believe you are having problem(s) with the medication(s).  Please schedule followup in 6 months, call sooner if problems.  Edema Edema is an abnormal buildup of fluids in your bodytissues. Edema is somewhatdependent on gravity to pull the fluid to the lowest place in your body. That makes the condition more common in the legs and thighs (lower extremities). Painless swelling of the feet and ankles is common and becomes more likely as you get older. It is also common in looser tissues, like around your eyes.  When the affected area is squeezed, the fluid may move out of that spot and leave a dent for a few moments. This dent is called pitting.  CAUSES  There are many possible causes of edema. Eating too much salt and being on your feet or sitting for a long time can cause edema in your legs and ankles. Hot weather may make edema worse. Common medical causes of edema include:  Heart failure.  Liver disease.  Kidney disease.  Weak blood vessels in your legs.  Cancer.  An injury.  Pregnancy.  Some medications.  Obesity. SYMPTOMS  Edema is usually painless.Your skin may look swollen or shiny.  DIAGNOSIS  Your health care provider may be able to diagnose edema by asking about your  medical history and doing a physical exam. You may need to have tests such as X-rays, an electrocardiogram, or blood tests to check for medical conditions that may cause edema.  TREATMENT  Edema treatment depends on the cause. If you have heart, liver, or kidney disease, you need the treatment appropriate for these conditions. General treatment may include:  Elevation of the affected body part above the level of your heart.  Compression of the affected body part. Pressure from elastic bandages or support stockings squeezes the tissues and forces fluid back into the blood vessels. This keeps fluid from entering the tissues.  Restriction of fluid and salt intake.  Use of a water pill (diuretic). These medications are appropriate only for some types of edema. They pull fluid out of your body and make you urinate more often. This gets rid of fluid and reduces swelling, but diuretics can have side effects. Only use diuretics as directed by your health care provider. HOME CARE INSTRUCTIONS   Keep the affected body part above the level of your heart when you are lying down.   Do not sit still or stand for prolonged periods.   Do not put anything directly under your knees when lying down.  Do not wear constricting clothing or garters on your upper legs.   Exercise your legs to work the fluid back into your blood vessels. This may help the swelling go down.   Wear elastic bandages or support stockings to reduce ankle swelling as directed by your health care  provider.   Eat a low-salt diet to reduce fluid if your health care provider recommends it.   Only take medicines as directed by your health care provider. SEEK MEDICAL CARE IF:   Your edema is not responding to treatment.  You have heart, liver, or kidney disease and notice symptoms of edema.  You have edema in your legs that does not improve after elevating them.   You have sudden and unexplained weight gain. SEEK IMMEDIATE  MEDICAL CARE IF:   You develop shortness of breath or chest pain.   You cannot breathe when you lie down.  You develop pain, redness, or warmth in the swollen areas.   You have heart, liver, or kidney disease and suddenly get edema.  You have a fever and your symptoms suddenly get worse. MAKE SURE YOU:   Understand these instructions.  Will watch your condition.  Will get help right away if you are not doing well or get worse. Document Released: 01/11/2005 Document Revised: 05/28/2013 Document Reviewed: 11/03/2012 [facility] Patient Information 2015 [place], [place]. This information is not intended to replace advice given to you by your health care provider. Make sure you discuss any questions you have with your health care provider.

## 2013-12-12 ENCOUNTER — Other Ambulatory Visit (HOSPITAL_COMMUNITY): Payer: Self-pay | Admitting: Internal Medicine

## 2013-12-12 ENCOUNTER — Ambulatory Visit (HOSPITAL_COMMUNITY): Payer: Medicare Other | Attending: Cardiovascular Disease | Admitting: Radiology

## 2013-12-12 DIAGNOSIS — E038 Other specified hypothyroidism: Secondary | ICD-10-CM

## 2013-12-12 DIAGNOSIS — R609 Edema, unspecified: Secondary | ICD-10-CM | POA: Insufficient documentation

## 2013-12-12 DIAGNOSIS — I10 Essential (primary) hypertension: Secondary | ICD-10-CM | POA: Insufficient documentation

## 2013-12-12 DIAGNOSIS — Z87891 Personal history of nicotine dependence: Secondary | ICD-10-CM | POA: Insufficient documentation

## 2013-12-12 DIAGNOSIS — R600 Localized edema: Secondary | ICD-10-CM

## 2013-12-12 NOTE — Progress Notes (Signed)
Echocardiogram performed.  

## 2013-12-25 ENCOUNTER — Ambulatory Visit (INDEPENDENT_AMBULATORY_CARE_PROVIDER_SITE_OTHER): Payer: Medicare Other | Admitting: Internal Medicine

## 2013-12-25 ENCOUNTER — Encounter: Payer: Self-pay | Admitting: Internal Medicine

## 2013-12-25 VITALS — BP 134/80 | HR 82 | Temp 97.9°F | Ht 60.0 in | Wt 181.2 lb

## 2013-12-25 DIAGNOSIS — J029 Acute pharyngitis, unspecified: Secondary | ICD-10-CM | POA: Diagnosis not present

## 2013-12-25 DIAGNOSIS — M609 Myositis, unspecified: Secondary | ICD-10-CM | POA: Diagnosis not present

## 2013-12-25 DIAGNOSIS — IMO0001 Reserved for inherently not codable concepts without codable children: Secondary | ICD-10-CM

## 2013-12-25 DIAGNOSIS — M791 Myalgia: Secondary | ICD-10-CM | POA: Diagnosis not present

## 2013-12-25 MED ORDER — AZITHROMYCIN 250 MG PO TABS: ORAL_TABLET | ORAL | Status: DC

## 2013-12-25 NOTE — Patient Instructions (Signed)
Upper Respiratory Infection, Adult An upper respiratory infection (URI) is also sometimes known as the common cold. The upper respiratory tract includes the nose, sinuses, throat, trachea, and bronchi. Bronchi are the airways leading to the lungs. Most people improve within 1 week, but symptoms can last up to 2 weeks. A residual cough may last even longer.  CAUSES Many different viruses can infect the tissues lining the upper respiratory tract. The tissues become irritated and inflamed and often become very moist. Mucus production is also common. A cold is contagious. You can easily spread the virus to others by oral contact. This includes kissing, sharing a glass, coughing, or sneezing. Touching your mouth or nose and then touching a surface, which is then touched by another person, can also spread the virus. SYMPTOMS  Symptoms typically develop 1 to 3 days after you come in contact with a cold virus. Symptoms vary from person to person. They may include:  Runny nose.  Sneezing.  Nasal congestion.  Sinus irritation.  Sore throat.  Loss of voice (laryngitis).  Cough.  Fatigue.  Muscle aches.  Loss of appetite.  Headache.  Low-grade fever. DIAGNOSIS  You might diagnose your own cold based on familiar symptoms, since most people get a cold 2 to 3 times a year. Your caregiver can confirm this based on your exam. Most importantly, your caregiver can check that your symptoms are not due to another disease such as strep throat, sinusitis, pneumonia, asthma, or epiglottitis. Blood tests, throat tests, and X-rays are not necessary to diagnose a common cold, but they may sometimes be helpful in excluding other more serious diseases. Your caregiver will decide if any further tests are required. RISKS AND COMPLICATIONS  You may be at risk for a more severe case of the common cold if you smoke cigarettes, have chronic heart disease (such as heart failure) or lung disease (such as asthma), or if  you have a weakened immune system. The very young and very old are also at risk for more serious infections. Bacterial sinusitis, middle ear infections, and bacterial pneumonia can complicate the common cold. The common cold can worsen asthma and chronic obstructive pulmonary disease (COPD). Sometimes, these complications can require emergency medical care and may be life-threatening. PREVENTION  The best [place] to protect against getting a cold is to practice good hygiene. Avoid oral or hand contact with people with cold symptoms. Wash your hands often if contact occurs. There is no clear evidence that vitamin C, vitamin E, echinacea, or exercise reduces the chance of developing a cold. However, it is always recommended to get plenty of rest and practice good nutrition. TREATMENT  Treatment is directed at relieving symptoms. There is no cure. Antibiotics are not effective, because the infection is caused by a virus, not by bacteria. Treatment may include:  Increased fluid intake. Sports drinks offer valuable electrolytes, sugars, and fluids.  Breathing heated mist or steam (vaporizer or shower).  Eating chicken soup or other clear broths, and maintaining good nutrition.  Getting plenty of rest.  Using gargles or lozenges for comfort.  Controlling fevers with ibuprofen or acetaminophen as directed by your caregiver.  Increasing usage of your inhaler if you have asthma. Zinc gel and zinc lozenges, taken in the first 24 hours of the common cold, can shorten the duration and lessen the severity of symptoms. Pain medicines may help with fever, muscle aches, and throat pain. A variety of non-prescription medicines are available to treat congestion and runny nose. Your caregiver   can make recommendations and may suggest nasal or lung inhalers for other symptoms.  HOME CARE INSTRUCTIONS   Only take over-the-counter or prescription medicines for pain, discomfort, or fever as directed by your  caregiver.  Use a warm mist humidifier or inhale steam from a shower to increase air moisture. This may keep secretions moist and make it easier to breathe.  Drink enough water and fluids to keep your urine clear or pale yellow.  Rest as needed.  Return to work when your temperature has returned to normal or as your caregiver advises. You may need to stay home longer to avoid infecting others. You can also use a face mask and careful hand washing to prevent spread of the virus. SEEK MEDICAL CARE IF:   After the first few days, you feel you are getting worse rather than better.  You need your caregiver's advice about medicines to control symptoms.  You develop chills, worsening shortness of breath, or brown or red sputum. These may be signs of pneumonia.  You develop yellow or brown nasal discharge or pain in the face, especially when you bend forward. These may be signs of sinusitis.  You develop a fever, swollen neck glands, pain with swallowing, or white areas in the back of your throat. These may be signs of strep throat. SEEK IMMEDIATE MEDICAL CARE IF:   You have a fever.  You develop severe or persistent headache, ear pain, sinus pain, or chest pain.  You develop wheezing, a prolonged cough, cough up blood, or have a change in your usual mucus (if you have chronic lung disease).  You develop sore muscles or a stiff neck. Document Released: 07/07/2000 Document Revised: 04/05/2011 Document Reviewed: 04/18/2013 [employer] Patient Information 2015 [employer]. This information is not intended to replace advice given to you by your health care provider. Make sure you discuss any questions you have with your health care provider.  

## 2013-12-25 NOTE — Progress Notes (Signed)
Pre visit review using our clinic review tool, if applicable. No additional management support is needed unless otherwise documented below in the visit note. 

## 2013-12-25 NOTE — Progress Notes (Signed)
Subjective:    Patient ID: Anna Adams, female    DOB: 03-30-1934, 79 y.o.   MRN: [id number]  HPI  Patient is here for URI sx x 72h - no sick contacts at home, using Tylenol prn with some improvement in symptoms  Also reviewed chronic medical issues and interval medical events  Past Medical History  Diagnosis Date  . Hypertension   . Parotid tumor     L side, s/p resection 1980 and 2007  . History of renal calculi   . Hypothyroidism   . Asthma   . ALLERGIC RHINITIS   . Asthma   . Osteoarthritis   . GERD (gastroesophageal reflux disease)     with HH    Review of Systems  Constitutional: Positive for chills and fatigue. Negative for fever, activity change, appetite change and unexpected weight change.  HENT: Positive for postnasal drip (more than usual AR), sneezing and sore throat. Negative for congestion, rhinorrhea, sinus pressure, trouble swallowing and voice change.   Respiratory: Positive for cough (occ). Negative for chest tightness and shortness of breath.   Cardiovascular: Negative for chest pain and leg swelling.  Neurological: Negative for headaches.       Objective:   Physical Exam  BP 134/80 mmHg  Pulse 82  Temp(Src) 97.9 F (36.6 C) (Oral)  Ht 5' (1.524 m)  Wt 181 lb 4 oz (82.214 kg)  BMI 35.40 kg/m2  SpO2 95% Wt Readings from Last 3 Encounters:  12/25/13 181 lb 4 oz (82.214 kg)  11/26/13 182 lb 12.8 oz (82.918 kg)  03/23/13 180 lb 9.6 oz (81.92 kg)   Constitutional: She appears well-developed and well-nourished. No distress.  HENT: NCAT, sinuses nontender. Nares with trace clear discharge, no turbinate swelling. Oropharynx with moderate erythema and white postnasal drip. No exudate. Neck: Normal range of motion. Mild shoddy anterior lymphadenopathy Neck supple. No JVD present. No thyromegaly present.  Cardiovascular: Normal rate, regular rhythm and normal heart sounds.  No murmur heard. No BLE edema. Pulmonary/Chest: Effort normal and breath sounds  normal. No respiratory distress. She has no wheezes.  Psychiatric: She has a normal mood and affect. Her behavior is normal. Judgment and thought content normal.   Lab Results  Component Value Date   WBC 8.3 11/26/2013   HGB 13.8 11/26/2013   HCT 42.0 11/26/2013   PLT 239.0 11/26/2013   GLUCOSE 76 11/26/2013   CHOL 173 02/07/2013   TRIG 117.0 02/07/2013   HDL 63.90 02/07/2013   LDLCALC 86 02/07/2013   ALT 22 02/07/2013   AST 36 02/07/2013   NA 136 11/26/2013   K 4.1 11/26/2013   CL 99 11/26/2013   CREATININE 0.7 11/26/2013   BUN 20 11/26/2013   CO2 32 11/26/2013   TSH 0.43 11/26/2013    US Soft Tissue Head/neck  03/28/2013   CLINICAL DATA:  Palpable small nodule in the left submandibular region  EXAM: ULTRASOUND OF HEAD/NECK SOFT TISSUES  TECHNIQUE: Ultrasound examination of the head and neck soft tissues was performed in the area of clinical concern.  COMPARISON:  MR soft tissue neck of 06/26/2005  FINDINGS: At the site in question, there is a small 3 x 2 x 3 mm hypoechoic structure. This appears to demonstrate some through transmission and probably represents a small cyst or possibly a tiny lymph node. No blood flow is demonstrated within this hypoechoic structure.  IMPRESSION: 3 mm hypoechoic structure corresponds to the palpable abnormality and may represent a small cyst.   Electronically Signed  By: Ivar Drape M.D.   On: 03/28/2013 12:07       Assessment & Plan:    URI - explained lack of efficacy of antibiotics in viral disease - symptomatic care recommended: antitussive and nasal decongestants/steroids for sinusitis; hydration and rest with tylenol or ibuprofen as needed for headache and myalgia symptoms  - pt to call if symptoms worse or unimproved  Paper prescription for Z-Pak provided in case symptoms worsen given history of pulmonary disease with recurrent bronchitis and patient's plans for out-of-town travel

## 2014-02-07 ENCOUNTER — Other Ambulatory Visit: Payer: Self-pay | Admitting: Internal Medicine

## 2014-02-22 ENCOUNTER — Ambulatory Visit: Payer: Medicare Other | Admitting: Podiatry

## 2014-03-01 ENCOUNTER — Encounter: Payer: Self-pay | Admitting: Podiatry

## 2014-03-01 ENCOUNTER — Ambulatory Visit (INDEPENDENT_AMBULATORY_CARE_PROVIDER_SITE_OTHER): Payer: Medicare Other | Admitting: Podiatry

## 2014-03-01 VITALS — BP 122/68 | HR 73

## 2014-03-01 DIAGNOSIS — B351 Tinea unguium: Secondary | ICD-10-CM | POA: Diagnosis not present

## 2014-03-01 DIAGNOSIS — M79606 Pain in leg, unspecified: Secondary | ICD-10-CM

## 2014-03-01 NOTE — Progress Notes (Signed)
Subjective:  80 y.o. year old female patient presents complaining of painful nails.   Objective: Dermatologic: Thick yellow deformed nails x 10.  No acute skin lesions noted.  Vascular: Pedal pulses DP palpable on right and left is not palpable. PT is palpable on both ankles.  Orthopedic: Severe HAV with bunion. Contracted lesser digits.  Neurologic: All epicritic and tactile sensations grossly intact.   Assessment:  Dystrophic mycotic nails x 10.  Severe HAV with bunions and hammer toes bilateral.   Treatment: All callus and mycotic nails debrided.  Return in 3 months or as needed.

## 2014-03-01 NOTE — Patient Instructions (Signed)
Seen for hypertrophic nails. All nails debrided. Return in 3 months or as needed.  

## 2014-04-13 ENCOUNTER — Other Ambulatory Visit: Payer: Self-pay | Admitting: Internal Medicine

## 2014-04-29 ENCOUNTER — Other Ambulatory Visit: Payer: Self-pay | Admitting: Internal Medicine

## 2014-04-29 ENCOUNTER — Ambulatory Visit (INDEPENDENT_AMBULATORY_CARE_PROVIDER_SITE_OTHER): Payer: Medicare Other | Admitting: Emergency Medicine

## 2014-04-29 VITALS — BP 138/80 | HR 78 | Temp 98.4°F | Resp 90 | Ht 60.0 in | Wt 182.0 lb

## 2014-04-29 DIAGNOSIS — R3915 Urgency of urination: Secondary | ICD-10-CM | POA: Diagnosis not present

## 2014-04-29 DIAGNOSIS — N3091 Cystitis, unspecified with hematuria: Secondary | ICD-10-CM

## 2014-04-29 LAB — POCT UA - MICROSCOPIC ONLY
CASTS, UR, LPF, POC: NEGATIVE
CRYSTALS, UR, HPF, POC: NEGATIVE
Yeast, UA: NEGATIVE

## 2014-04-29 LAB — POCT URINALYSIS DIPSTICK
Glucose, UA: NEGATIVE
Ketones, UA: NEGATIVE
Nitrite, UA: POSITIVE
Protein, UA: NEGATIVE
Spec Grav, UA: 1.015
UROBILINOGEN UA: 0.2
pH, UA: 7

## 2014-04-29 MED ORDER — SULFAMETHOXAZOLE-TRIMETHOPRIM 800-160 MG PO TABS
1.0000 | ORAL_TABLET | Freq: Two times a day (BID) | ORAL | Status: DC
Start: 2014-04-29 — End: 2014-06-25

## 2014-04-29 MED ORDER — PHENAZOPYRIDINE HCL 200 MG PO TABS: 200.0000 mg | ORAL_TABLET | Freq: Three times a day (TID) | ORAL | Status: DC | PRN

## 2014-04-29 NOTE — Progress Notes (Signed)
[facility] [address] [place] [address] [phone]  Date:  04/29/2014   Name:  Anna Adams   DOB:  03/25/1934   MRN:  [id number]  PCP:  Gwendolyn Grant, MD    Chief Complaint: Urinary Frequency and Hematuria   History of Present Illness:  Anna Adams is a 80 y.o. very pleasant female patient who presents with the following:  Ill with dysuria, urgency and frequency since this afternoon. No fever or chills No nausea or vomiting. Some hematuria No nausea or vomiting.  No stool change History of prior cystitis No improvement with over the counter medications or other home remedies.  Denies other complaint or health concern today.   Patient Active Problem List   Diagnosis Date Noted  . Edema 11/26/2013  . Pain in lower limb 05/11/2013  . Parotid tumor 03/23/2013  . Dyslipidemia   . Onychomycosis due to dermatophyte 05/05/2012  . Pain in joint, ankle and foot 05/05/2012  . Osteoporosis, post-menopausal   . GERD (gastroesophageal reflux disease)   . Benign hypertensive heart disease without heart failure 04/28/2011  . Osteoarthritis 01/08/2011  . Hypothyroidism 02/07/2009  . HYPERTENSION 02/07/2009  . ALLERGIC RHINITIS 02/07/2009  . INTRINSIC ASTHMA, UNSPECIFIED 02/07/2009    Past Medical History  Diagnosis Date  . Hypertension   . Parotid tumor     L side, s/p resection 1980 and 2007  . History of renal calculi   . Hypothyroidism   . Asthma   . ALLERGIC RHINITIS   . Asthma   . Osteoarthritis   . GERD (gastroesophageal reflux disease)     with HH  . Allergy   . Anemia   . Cataract     Past Surgical History  Procedure Laterality Date  . Knee arthroplasty  04/22/2006    DR. WQuillian Quince CAFFREY  . Tubal ligation    . Lumbar disc surgery    . Tonsillectomy and adenoidectomy    . Cardiovascular stress test  07/12/2005    EF 85%  . Ganglion cyst excision  1954 & 1963    (L) wrist x's 2  . Parotid gland tumor excision  1980, 2007     Left  . Cervical discectomy  1993  . Left knee replacement  2005  . Right knee replacement  2008    History  Substance Use Topics  . Smoking status: Former Smoker    Quit date: 09/01/1962  . Smokeless tobacco: Never Used     Comment: married, lives with spouse, retire Therapist, sports  . Alcohol Use: No    Family History  Problem Relation Age of Onset  . Asthma Mother   . Cancer Mother   . Heart disease Mother   . Hypertension Father   . Heart disease Father   . Hyperlipidemia Father   . Stroke Father   . Mental illness Father   . Stroke Maternal Grandmother   . Heart disease Maternal Grandfather   . Hypertension Paternal Grandfather     Allergies  Allergen Reactions  . Dyphylline-Guaifenesin   . Erythromycin   . Mevacor [Lovastatin]     myalgias  . Other     CT scan dye    Medication list has been reviewed and updated.  Current Outpatient Prescriptions on File Prior to Visit  Medication Sig Dispense Refill  . albuterol (PROVENTIL HFA;VENTOLIN HFA) 108 (90 BASE) MCG/ACT inhaler Inhale 1-2 puffs into the lungs every 6 (six) hours as needed for wheezing. 1  Inhaler 1  . atorvastatin (LIPITOR) 20 MG tablet TAKE 1 TABLET DAILY 90 tablet 3  . b complex vitamins tablet Take 1 tablet by mouth daily.      . cholecalciferol (VITAMIN D) 1000 UNITS tablet Take 1,000 Units by mouth daily.      Marland Kitchen etodolac (LODINE XL) 400 MG 24 hr tablet TAKE 1 TABLET DAILY 90 tablet 3  . famotidine (PEPCID) 20 MG tablet Take 1 tablet (20 mg total) by mouth daily. 90 tablet 3  . losartan-hydrochlorothiazide (HYZAAR) 100-25 MG per tablet Take 1 tablet by mouth daily. 90 tablet 3  . Multiple Vitamin (MULTIVITAMIN) tablet Take 1 tablet by mouth daily.    Marland Kitchen SYNTHROID 150 MCG tablet TAKE 1 TABLET DAILY 90 tablet 3  . vitamin E 200 UNIT capsule Take 200 Units by mouth daily.      . furosemide (LASIX) 20 MG tablet Take 1 tablet (20 mg total) by mouth daily as needed for fluid or edema. (Patient not taking:  Reported on 04/29/2014) 30 tablet 3  . glucosamine-chondroitin 500-400 MG tablet Take 1 tablet by mouth daily. As needed     No current facility-administered medications on file prior to visit.    Review of Systems:  As per HPI, otherwise negative.    Physical Examination: Filed Vitals:   04/29/14 1820  BP: 138/80  Pulse: 78  Temp: 98.4 F (36.9 C)  Resp: 90   Filed Vitals:   04/29/14 1820  Height: 5' (1.524 m)  Weight: 182 lb (82.555 kg)   Body mass index is 35.54 kg/(m^2). Ideal Body Weight: Weight in (lb) to have BMI = 25: 127.7   GEN: WDWN, NAD, Non-toxic, Alert & Oriented x 3 HEENT: Atraumatic, Normocephalic.  Ears and Nose: No external deformity. EXTR: No clubbing/cyanosis/edema NEURO: Normal gait.  PSYCH: Normally interactive. Conversant. Not depressed or anxious appearing.  Calm demeanor.  BACK no CVA tenderness ABD soft not tender.  Assessment and Plan: Hemorrhagic cystitis Pyridium Septra  Signed,  Ellison Carwin, MD   Results for orders placed or performed in visit on 04/29/14  POCT urinalysis dipstick  Result Value Ref Range   Color, UA yellow    Clarity, UA hazy    Glucose, UA neg    Bilirubin, UA large    Ketones, UA neg    Spec Grav, UA 1.015    Blood, UA moderate    pH, UA 7.0    Protein, UA neg    Urobilinogen, UA 0.2    Nitrite, UA positive    Leukocytes, UA small (1+)   POCT UA - Microscopic Only  Result Value Ref Range   WBC, Ur, HPF, POC 10-15    RBC, urine, microscopic 3-6    Bacteria, U Microscopic 2+    Mucus, UA trace    Epithelial cells, urine per micros 2-5    Crystals, Ur, HPF, POC neg    Casts, Ur, LPF, POC neg    Yeast, UA neg

## 2014-04-29 NOTE — Patient Instructions (Signed)

## 2014-05-01 ENCOUNTER — Other Ambulatory Visit: Payer: Self-pay

## 2014-05-01 MED ORDER — ATORVASTATIN CALCIUM 20 MG PO TABS: 20.0000 mg | ORAL_TABLET | Freq: Every day | ORAL | Status: DC

## 2014-05-01 MED ORDER — FAMOTIDINE 20 MG PO TABS: 20.0000 mg | ORAL_TABLET | Freq: Every day | ORAL | Status: DC

## 2014-05-27 ENCOUNTER — Ambulatory Visit: Payer: Medicare Other | Admitting: Internal Medicine

## 2014-05-31 ENCOUNTER — Ambulatory Visit (INDEPENDENT_AMBULATORY_CARE_PROVIDER_SITE_OTHER): Payer: Medicare Other | Admitting: Podiatry

## 2014-05-31 ENCOUNTER — Encounter: Payer: Self-pay | Admitting: Podiatry

## 2014-05-31 VITALS — BP 126/71 | HR 66

## 2014-05-31 DIAGNOSIS — B351 Tinea unguium: Secondary | ICD-10-CM | POA: Diagnosis not present

## 2014-05-31 DIAGNOSIS — M79606 Pain in leg, unspecified: Secondary | ICD-10-CM | POA: Diagnosis not present

## 2014-05-31 NOTE — Progress Notes (Signed)
Subjective:  80 y.o. year old female patient presents complaining of painful nails. No new problems.  Objective: Dermatologic: Thick yellow deformed nails x 10.  No acute skin lesions noted.  Vascular: Pedal pulses DP palpable on right and left is not palpable. PT is palpable on both ankles.  Orthopedic: Severe HAV with bunion. Contracted lesser digits.  Neurologic: All epicritic and tactile sensations grossly intact.   Assessment:  Dystrophic mycotic nails x 10.  Severe HAV with bunions and hammer toes bilateral.   Treatment: All callus and mycotic nails debrided.  Return in 3 months or as needed.

## 2014-05-31 NOTE — Patient Instructions (Signed)
Seen for hypertrophic nails. All nails debrided. Return in 3 months or as needed.  

## 2014-06-25 ENCOUNTER — Ambulatory Visit (INDEPENDENT_AMBULATORY_CARE_PROVIDER_SITE_OTHER): Payer: Medicare Other | Admitting: Cardiology

## 2014-06-25 ENCOUNTER — Encounter: Payer: Self-pay | Admitting: Cardiology

## 2014-06-25 VITALS — BP 128/76 | HR 68 | Ht 60.0 in | Wt 178.0 lb

## 2014-06-25 DIAGNOSIS — E7800 Pure hypercholesterolemia, unspecified: Secondary | ICD-10-CM

## 2014-06-25 DIAGNOSIS — K219 Gastro-esophageal reflux disease without esophagitis: Secondary | ICD-10-CM

## 2014-06-25 DIAGNOSIS — I119 Hypertensive heart disease without heart failure: Secondary | ICD-10-CM

## 2014-06-25 DIAGNOSIS — E039 Hypothyroidism, unspecified: Secondary | ICD-10-CM

## 2014-06-25 NOTE — Patient Instructions (Signed)
Medication Instructions: Your physician recommends that you continue on your current medications as directed. Please refer to the Current Medication list given to you today.  Labwork: none  Testing/Procedures: none  Follow-Up: Your physician wants you to follow-up in: 1 year ov You will receive a reminder letter in the mail two months in advance. If you don't receive a letter, please call our office to schedule the follow-up appointment.     

## 2014-06-25 NOTE — Progress Notes (Signed)
Cardiology Office Note   Date:  06/25/2014   ID:  CHRISTIANNA BELMONTE, DOB 10-29-1934, MRN [id number]  PCP:  Gwendolyn Grant, MD  Cardiologist: Darlin Coco MD  No chief complaint on file.     History of Present Illness: Anna Adams is a 80 y.o. female who presents for a one-year office visit  This pleasant 80 year old woman is seen for a one-year followup office visit. She has a past history of essential hypertension and a history of hypercholesterolemia. She also has a history of hypothyroidism and osteoarthritis. She has a known large hiatal hernia. She's had a problem with anemia in the past. She does not have a history of ischemic heart disease and she had a normal nuclear stress test in June 2007. She had a recent bone density scan which showed osteoporosis. She has a history of asthma and has had no recent episodes of acute respiratory distress. She gets exercise by going to go to the gym and doing water aerobics 3 times a week.  Since we last saw her she had a 2-D echocardiogram on 12/12/13 which showed normal left ventricular systolic function with ejection fraction 60-65% and no significant valve abnormalities.  There was mild grade 1 diastolic dysfunction. She has not been having any chest pain or shortness of breath.  She has had some problems with osteoarthritis for left hip.  She also has a history of osteoporosis but is not taking any medication at this time.  Past Medical History  Diagnosis Date  . Hypertension   . Parotid tumor     L side, s/p resection 1980 and 2007  . History of renal calculi   . Hypothyroidism   . Asthma   . ALLERGIC RHINITIS   . Asthma   . Osteoarthritis   . GERD (gastroesophageal reflux disease)     with HH  . Allergy   . Anemia   . Cataract     Past Surgical History  Procedure Laterality Date  . Knee arthroplasty  04/22/2006    DR. WQuillian Quince CAFFREY  . Tubal ligation    . Lumbar disc surgery    . Tonsillectomy and adenoidectomy     . Cardiovascular stress test  07/12/2005    EF 85%  . Ganglion cyst excision  1954 & 1963    (L) wrist x's 2  . Parotid gland tumor excision  1980, 2007    Left  . Cervical discectomy  1993  . Left knee replacement  2005  . Right knee replacement  2008     Current Outpatient Prescriptions  Medication Sig Dispense Refill  . albuterol (PROVENTIL HFA;VENTOLIN HFA) 108 (90 BASE) MCG/ACT inhaler Inhale 1-2 puffs into the lungs every 6 (six) hours as needed for wheezing. 1 Inhaler 1  . atorvastatin (LIPITOR) 20 MG tablet Take 1 tablet (20 mg total) by mouth daily. 90 tablet 3  . b complex vitamins tablet Take 1 tablet by mouth daily.      . cholecalciferol (VITAMIN D) 1000 UNITS tablet Take 1,000 Units by mouth daily.      Marland Kitchen etodolac (LODINE XL) 400 MG 24 hr tablet Take 400 mg by mouth daily.    . famotidine (PEPCID) 20 MG tablet Take 1 tablet (20 mg total) by mouth daily. 90 tablet 3  . furosemide (LASIX) 20 MG tablet Take 1 tablet (20 mg total) by mouth daily as needed for fluid or edema. 30 tablet 3  . glucosamine-chondroitin 500-400 MG tablet Take 1  tablet by mouth daily.     Marland Kitchen levothyroxine (SYNTHROID, LEVOTHROID) 150 MCG tablet Take 150 mcg by mouth daily before breakfast.    . losartan-hydrochlorothiazide (HYZAAR) 100-25 MG per tablet Take 1 tablet by mouth daily. 90 tablet 3  . Multiple Vitamin (MULTIVITAMIN) tablet Take 1 tablet by mouth daily.    . vitamin E 200 UNIT capsule Take 200 Units by mouth daily.       No current facility-administered medications for this visit.    Allergies:   Dyphylline-guaifenesin; Erythromycin; Mevacor; and Other    Social History:  The patient  reports that she quit smoking about 51 years ago. She has never used smokeless tobacco. She reports that she does not drink alcohol or use illicit drugs.   Family History:  The patient's family history includes Asthma in her mother; Cancer in her mother; Heart disease in her father, maternal grandfather,  and mother; Hyperlipidemia in her father; Hypertension in her father and paternal grandfather; Mental illness in her father; Stroke in her father and maternal grandmother.    ROS:  Please see the history of present illness.   Otherwise, review of systems are positive for none.   All other systems are reviewed and negative.    PHYSICAL EXAM: VS:  BP 128/76 mmHg  Pulse 68  Ht 5' (1.524 m)  Wt 178 lb (80.74 kg)  BMI 34.76 kg/m2 , BMI Body mass index is 34.76 kg/(m^2). GEN: Well nourished, well developed, in no acute distress HEENT: normal Neck: no JVD, carotid bruits, or masses Cardiac: RRR; no murmurs, rubs, or gallops,no edema  Respiratory:  clear to auscultation bilaterally, normal work of breathing GI: soft, nontender, nondistended, + BS MS: no deformity or atrophy Skin: warm and dry, no rash Neuro:  Strength and sensation are intact Psych: euthymic mood, full affect   EKG:  EKG is ordered today. The ekg ordered today demonstrates normal sinus rhythm.  Within normal limits.   Recent Labs: 11/26/2013: BUN 20; Creatinine 0.7; Hemoglobin 13.8; Platelets 239.0; Potassium 4.1; Sodium 136; TSH 0.43    Lipid Panel    Component Value Date/Time   CHOL 173 02/07/2013 0935   TRIG 117.0 02/07/2013 0935   HDL 63.90 02/07/2013 0935   CHOLHDL 3 02/07/2013 0935   VLDL 23.4 02/07/2013 0935   LDLCALC 86 02/07/2013 0935      Wt Readings from Last 3 Encounters:  06/25/14 178 lb (80.74 kg)  04/29/14 182 lb (82.555 kg)  12/25/13 181 lb 4 oz (82.214 kg)         ASSESSMENT AND PLAN:  1.  Essential hypertension 2.  Hypercholesterolemia, on atorvastatin.  Tolerating well. 3.  Hypothyroidism, on Synthroid 4.  Osteoarthritis 5.  Osteoporosis    Current medicines are reviewed at length with the patient today.  The patient does not have concerns regarding medicines.  The following changes have been made:  no change  Labs/ tests ordered today include:  No orders of the defined  types were placed in this encounter.   Continue current medication.  Recheck in one year for follow-up office visit.  She will be getting her annual physical with Dr. Asa Lente in August   Berna Spare MD 06/25/2014 4:41 PM    [address], [place], [place]  [address] Phone: [phone]; Fax: [phone]

## 2014-07-09 ENCOUNTER — Other Ambulatory Visit: Payer: Self-pay

## 2014-07-09 DIAGNOSIS — Z1231 Encounter for screening mammogram for malignant neoplasm of breast: Secondary | ICD-10-CM

## 2014-07-22 ENCOUNTER — Other Ambulatory Visit: Payer: Self-pay

## 2014-07-26 ENCOUNTER — Other Ambulatory Visit: Payer: Self-pay

## 2014-07-26 ENCOUNTER — Telehealth: Payer: Self-pay | Admitting: Internal Medicine

## 2014-07-26 MED ORDER — LEVOTHYROXINE SODIUM 150 MCG PO TABS: 150.0000 ug | ORAL_TABLET | Freq: Every day | ORAL | Status: DC

## 2014-07-26 NOTE — Telephone Encounter (Signed)
Patient need a new prescription of synthroid, [facility]

## 2014-07-30 MED ORDER — LEVOTHYROXINE SODIUM 150 MCG PO TABS: 150.0000 ug | ORAL_TABLET | Freq: Every day | ORAL | Status: DC

## 2014-07-30 NOTE — Telephone Encounter (Signed)
erx done

## 2014-08-08 ENCOUNTER — Ambulatory Visit
Admission: RE | Admit: 2014-08-08 | Discharge: 2014-08-08 | Disposition: A | Discharge status: Home / Self Care | Payer: Medicare Other | Source: Ambulatory Visit

## 2014-08-08 DIAGNOSIS — Z1231 Encounter for screening mammogram for malignant neoplasm of breast: Secondary | ICD-10-CM

## 2014-08-30 ENCOUNTER — Ambulatory Visit (INDEPENDENT_AMBULATORY_CARE_PROVIDER_SITE_OTHER): Payer: Medicare Other | Admitting: Podiatry

## 2014-08-30 ENCOUNTER — Encounter: Payer: Self-pay | Admitting: Podiatry

## 2014-08-30 VITALS — BP 121/72 | HR 76

## 2014-08-30 DIAGNOSIS — M79606 Pain in leg, unspecified: Secondary | ICD-10-CM

## 2014-08-30 DIAGNOSIS — B351 Tinea unguium: Secondary | ICD-10-CM

## 2014-08-30 NOTE — Progress Notes (Signed)
Subjective:  80 y.o. year old female patient presents complaining of painful nails. No new problems.  Objective: Dermatologic: Thick yellow deformed nails x 10.  No acute skin lesions noted.  Vascular: Pedal pulses DP palpable on right and left is not palpable. PT is palpable on both ankles.  Orthopedic: Severe HAV with bunion. Contracted lesser digits.  Neurologic: All epicritic and tactile sensations grossly intact.   Assessment:  Dystrophic mycotic nails x 10.  Severe HAV with bunions and hammer toes bilateral.   Treatment: All callus and mycotic nails debrided.  Return in 3 months or as needed.

## 2014-08-30 NOTE — Patient Instructions (Signed)
Seen for hypertrophic nails. All nails debrided. Return in 3 months or as needed.  

## 2014-09-17 ENCOUNTER — Ambulatory Visit (INDEPENDENT_AMBULATORY_CARE_PROVIDER_SITE_OTHER): Payer: Medicare Other | Admitting: Internal Medicine

## 2014-09-17 ENCOUNTER — Other Ambulatory Visit (INDEPENDENT_AMBULATORY_CARE_PROVIDER_SITE_OTHER): Payer: Medicare Other

## 2014-09-17 ENCOUNTER — Encounter: Payer: Self-pay | Admitting: Internal Medicine

## 2014-09-17 VITALS — BP 118/70 | HR 74 | Temp 98.1°F | Ht 60.0 in | Wt 180.5 lb

## 2014-09-17 DIAGNOSIS — K219 Gastro-esophageal reflux disease without esophagitis: Secondary | ICD-10-CM | POA: Diagnosis not present

## 2014-09-17 DIAGNOSIS — E785 Hyperlipidemia, unspecified: Secondary | ICD-10-CM

## 2014-09-17 DIAGNOSIS — I10 Essential (primary) hypertension: Secondary | ICD-10-CM | POA: Diagnosis not present

## 2014-09-17 DIAGNOSIS — Z0000 Encounter for general adult medical examination without abnormal findings: Secondary | ICD-10-CM | POA: Diagnosis not present

## 2014-09-17 DIAGNOSIS — E038 Other specified hypothyroidism: Secondary | ICD-10-CM

## 2014-09-17 DIAGNOSIS — I119 Hypertensive heart disease without heart failure: Secondary | ICD-10-CM

## 2014-09-17 DIAGNOSIS — M810 Age-related osteoporosis without current pathological fracture: Secondary | ICD-10-CM

## 2014-09-17 LAB — CBC WITH DIFFERENTIAL/PLATELET
Basophils Absolute: 0 10*3/uL (ref 0.0–0.1)
Basophils Relative: 0.4 % (ref 0.0–3.0)
EOS ABS: 0.2 10*3/uL (ref 0.0–0.7)
EOS PCT: 2.1 % (ref 0.0–5.0)
HCT: 41.4 % (ref 36.0–46.0)
Hemoglobin: 13.9 g/dL (ref 12.0–15.0)
LYMPHS PCT: 11.4 % — AB (ref 12.0–46.0)
Lymphs Abs: 0.8 10*3/uL (ref 0.7–4.0)
MCHC: 33.6 g/dL (ref 30.0–36.0)
MCV: 85.7 fl (ref 78.0–100.0)
MONO ABS: 0.7 10*3/uL (ref 0.1–1.0)
Monocytes Relative: 9.6 % (ref 3.0–12.0)
Neutro Abs: 5.6 10*3/uL (ref 1.4–7.7)
Neutrophils Relative %: 76.5 % (ref 43.0–77.0)
Platelets: 219 10*3/uL (ref 150.0–400.0)
RBC: 4.82 Mil/uL (ref 3.87–5.11)
RDW: 13.6 % (ref 11.5–15.5)
WBC: 7.3 10*3/uL (ref 4.0–10.5)

## 2014-09-17 LAB — LIPID PANEL
Cholesterol: 153 mg/dL (ref 0–200)
HDL: 52.8 mg/dL (ref >39.00)
LDL CALC: 73 mg/dL (ref 0–99)
NONHDL: 99.95
Total CHOL/HDL Ratio: 3
Triglycerides: 137 mg/dL (ref 0.0–149.0)
VLDL: 27.4 mg/dL (ref 0.0–40.0)

## 2014-09-17 LAB — TSH: TSH: 0.24 u[IU]/mL — AB (ref 0.35–4.50)

## 2014-09-17 LAB — HEPATIC FUNCTION PANEL
ALT: 16 U/L (ref 0–35)
AST: 23 U/L (ref 0–37)
Albumin: 4.2 g/dL (ref 3.5–5.2)
Alkaline Phosphatase: 107 U/L (ref 39–117)
BILIRUBIN DIRECT: 0.2 mg/dL (ref 0.0–0.3)
BILIRUBIN TOTAL: 0.6 mg/dL (ref 0.2–1.2)
TOTAL PROTEIN: 6.9 g/dL (ref 6.0–8.3)

## 2014-09-17 LAB — BASIC METABOLIC PANEL
BUN: 19 mg/dL (ref 6–23)
CHLORIDE: 100 meq/L (ref 96–112)
CO2: 33 mEq/L — ABNORMAL HIGH (ref 19–32)
Calcium: 10.9 mg/dL — ABNORMAL HIGH (ref 8.4–10.5)
Creatinine, Ser: 0.79 mg/dL (ref 0.40–1.20)
GFR: 74.7 mL/min (ref >60.00)
Glucose, Bld: 90 mg/dL (ref 70–99)
POTASSIUM: 4.6 meq/L (ref 3.5–5.1)
Sodium: 138 mEq/L (ref 135–145)

## 2014-09-17 NOTE — Progress Notes (Signed)
Subjective:    Patient ID: Anna Adams, female    DOB: [birth year], 80 y.o.   MRN: [id number]  HPI   Here for medicare wellness  Diet: heart healthy Physical activity: sedentary Depression/mood screen: negative Hearing: intact to whispered voice Visual acuity: grossly normal, performs annual eye exam  ADLs: capable Fall risk: reviewed Home safety: good Cognitive evaluation: intact to orientation, naming, recall and repetition EOL planning: adv directives, full code/ I agree  I have personally reviewed and have noted 1. The patient's medical and social history 2. Their use of alcohol, tobacco or illicit drugs 3. Their current medications and supplements 4. The patient's functional ability including ADL's, fall risks, home safety risks and hearing or visual impairment. 5. Diet and physical activities 6. Evidence for depression or mood disorders  Also reviewed chronic medical conditions, interval events and current concerns: See review of systems  Past Medical History  Diagnosis Date  . Hypertension   . Parotid tumor     L side, s/p resection 1980 and 2007  . History of renal calculi   . Hypothyroidism   . Asthma   . ALLERGIC RHINITIS   . Asthma   . Osteoarthritis   . GERD (gastroesophageal reflux disease)     with HH  . Anemia   . Cataract    Family History  Problem Relation Age of Onset  . Asthma Mother   . Cancer Mother   . Heart disease Mother   . Hypertension Father   . Heart disease Father   . Hyperlipidemia Father   . Stroke Father   . Mental illness Father   . Stroke Maternal Grandmother   . Heart disease Maternal Grandfather   . Hypertension Paternal Grandfather    Social History  Substance Use Topics  . Smoking status: Former Smoker    Quit date: 09/01/1962  . Smokeless tobacco: Never Used  . Alcohol Use: No    Review of Systems  Constitutional: Positive for fatigue. Negative for unexpected weight change.  Respiratory: Positive for  shortness of breath (w/ exertion). Negative for cough and wheezing.   Cardiovascular: Negative for chest pain and palpitations. Leg swelling: unchanged chronic edema BLE ankles.  Gastrointestinal: Negative for nausea, abdominal pain and diarrhea.  Neurological: Negative for dizziness, weakness, light-headedness and headaches.  Psychiatric/Behavioral: Negative for dysphoric mood. The patient is not nervous/anxious.   All other systems reviewed and are negative.  Patient Care Team: Rowe Clack, MD as PCP - General (Internal Medicine) Darlin Coco, MD (Cardiology) Shon Hough, MD (Ophthalmology) Myeong Roxine Caddy, DPM (Podiatry) Earlie Server, MD (Orthopedic Surgery) Kathee Delton, MD (Pulmonary Disease) Laurence Spates, MD (Gastroenterology)     Objective:    Physical Exam  Constitutional: She appears well-developed and well-nourished. No distress.  obese  Cardiovascular: Normal rate, regular rhythm and normal heart sounds.   No murmur heard. Pulmonary/Chest: Effort normal and breath sounds normal. No respiratory distress.  Musculoskeletal: She exhibits no edema.  Vitals reviewed.   BP 118/70 mmHg  Pulse 74  Temp(Src) 98.1 F (36.7 C) (Oral)  Ht 5' (1.524 m)  Wt 180 lb 8 oz (81.874 kg)  BMI 35.25 kg/m2  SpO2 98% Wt Readings from Last 3 Encounters:  09/17/14 180 lb 8 oz (81.874 kg)  06/25/14 178 lb (80.74 kg)  04/29/14 182 lb (82.555 kg)    Lab Results  Component Value Date   WBC 8.3 11/26/2013   HGB 13.8 11/26/2013   HCT 42.0 11/26/2013   PLT  239.0 11/26/2013   GLUCOSE 76 11/26/2013   CHOL 173 02/07/2013   TRIG 117.0 02/07/2013   HDL 63.90 02/07/2013   LDLCALC 86 02/07/2013   ALT 22 02/07/2013   AST 36 02/07/2013   NA 136 11/26/2013   K 4.1 11/26/2013   CL 99 11/26/2013   CREATININE 0.7 11/26/2013   BUN 20 11/26/2013   CO2 32 11/26/2013   TSH 0.43 11/26/2013    Mm Screening Breast Tomo Bilateral  08/08/2014   CLINICAL DATA:  Screening.   EXAM: DIGITAL SCREENING BILATERAL MAMMOGRAM WITH 3D TOMO WITH CAD  COMPARISON:  Previous exam(s).  ACR Breast Density Category c: The breast tissue is heterogeneously dense, which may obscure small masses.  FINDINGS: There are no findings suspicious for malignancy. Images were processed with CAD.  IMPRESSION: No mammographic evidence of malignancy. A result letter of this screening mammogram will be mailed directly to the patient.  RECOMMENDATION: Screening mammogram in one year. (Code:[id number])  BI-RADS CATEGORY  1: Negative.   Electronically Signed   By: Everlean Alstrom M.D.   On: 08/08/2014 15:49       Assessment & Plan:   AWV/z00.00 - Today patient counseled on age appropriate routine health concerns for screening and prevention, each reviewed and up to date or declined. Immunizations reviewed and up to date or declined. Labs ordered and reviewed. Risk factors for depression reviewed and negative. Hearing function and visual acuity are intact. ADLs screened and addressed as needed. Functional ability and level of safety reviewed and appropriate. Education, counseling and referrals performed based on assessed risks today. Patient provided with a copy of personalized plan for preventive services.  Problem List Items Addressed This Visit    Benign hypertensive heart disease without heart failure    BP Readings from Last 3 Encounters:  09/17/14 118/70  08/30/14 121/72  06/25/14 128/76   The current medical regimen is effective;  continue present plan and medications.       Relevant Orders   Basic metabolic panel   Dyslipidemia    On statin, LDL <100 at goal - check annually The current medical regimen is effective;  continue present plan and medications.       Relevant Orders   Lipid panel   Essential hypertension    BP Readings from Last 3 Encounters:  09/17/14 118/70  08/30/14 121/72  06/25/14 128/76   The current medical regimen is effective;  continue present plan and  medications.       Relevant Orders   Basic metabolic panel   GERD (gastroesophageal reflux disease)    Prn symptoms despite HH- uses H2B with good control Risk taking Lodine reviewed (daily for OA) The current medical regimen is effective;  continue present plan and medications.       Relevant Orders   CBC with Differential/Platelet   Hepatic function panel   Hypothyroidism    Remote RAI 1970s The current medical regimen is effective;  continue present plan and medications. Lab Results  Component Value Date   TSH 0.43 11/26/2013        Relevant Orders   TSH   Osteoporosis, post-menopausal    Post menopause status. LMP 1988.  declines calcium because of history kidney stone. Declines bisphos or Prolia as recommended fall 2013 No fall or fx hx Continue weightbearing exercise activity and vitamin D as ongoing No plans to repeat DEXA as will not change pt's decision to not take rx therapy for this condition  Other Visit Diagnoses    Routine general medical examination at a health care facility    -  Primary        Gwendolyn Grant, MD

## 2014-09-17 NOTE — Assessment & Plan Note (Signed)
Remote RAI 1970s The current medical regimen is effective;  continue present plan and medications. Lab Results  Component Value Date   TSH 0.43 11/26/2013

## 2014-09-17 NOTE — Assessment & Plan Note (Signed)
Post menopause status. LMP 1988.  declines calcium because of history kidney stone. Declines bisphos or Prolia as recommended fall 2013 No fall or fx hx Continue weightbearing exercise activity and vitamin D as ongoing No plans to repeat DEXA as will not change pt's decision to not take rx therapy for this condition

## 2014-09-17 NOTE — Assessment & Plan Note (Signed)
On statin, LDL <100 at goal - check annually The current medical regimen is effective;  continue present plan and medications.

## 2014-09-17 NOTE — Progress Notes (Signed)
Pre visit review using our clinic review tool, if applicable. No additional management support is needed unless otherwise documented below in the visit note. 

## 2014-09-17 NOTE — Assessment & Plan Note (Signed)
BP Readings from Last 3 Encounters:  09/17/14 118/70  08/30/14 121/72  06/25/14 128/76   The current medical regimen is effective;  continue present plan and medications.

## 2014-09-17 NOTE — Patient Instructions (Signed)
It was good to see you today.  We have reviewed your prior records including labs and tests today  Health Maintenance reviewed - get your Prevnar once you return from [place] - all other recommended immunizations and age-appropriate screenings are up-to-date or declined  Test(s) ordered today. Your results will be released to [place] (or called to you) after review, usually within 72hours after test completion. If any changes need to be made, you will be notified at that same time.  Medications reviewed and updated, no changes recommended at this time.  Please schedule followup in 12 months for annual exam and labs, call sooner if problems.

## 2014-09-17 NOTE — Assessment & Plan Note (Signed)
Prn symptoms despite HH- uses H2B with good control Risk taking Lodine reviewed (daily for OA) The current medical regimen is effective;  continue present plan and medications.

## 2014-10-21 ENCOUNTER — Telehealth: Payer: Self-pay | Admitting: Internal Medicine

## 2014-10-21 NOTE — Telephone Encounter (Signed)
Is requesting a prevnar 13 vac.  Please advise.

## 2014-10-23 NOTE — Telephone Encounter (Signed)
Patient is [facility] [address].  Patient states it is easier for her to get there to get injection.

## 2014-10-25 MED ORDER — PNEUMOCOCCAL 13-VAL CONJ VACC IM SUSP: 0.5000 mL | INTRAMUSCULAR | Status: DC

## 2014-10-25 NOTE — Telephone Encounter (Signed)
Okayed per PCP verbal

## 2014-10-25 NOTE — Telephone Encounter (Signed)
erx sent

## 2014-10-25 NOTE — Addendum Note (Signed)
Addended by: Lowella Dandy on: 10/25/2014 02:36 PM   Modules accepted: Orders

## 2014-11-04 DIAGNOSIS — Z23 Encounter for immunization: Secondary | ICD-10-CM | POA: Diagnosis not present

## 2014-11-29 ENCOUNTER — Ambulatory Visit: Payer: Medicare Other | Admitting: Podiatry

## 2014-12-02 ENCOUNTER — Encounter: Payer: Self-pay | Admitting: Podiatry

## 2014-12-02 ENCOUNTER — Ambulatory Visit (INDEPENDENT_AMBULATORY_CARE_PROVIDER_SITE_OTHER): Payer: Medicare Other | Admitting: Podiatry

## 2014-12-02 ENCOUNTER — Ambulatory Visit: Payer: Medicare Other | Admitting: Podiatry

## 2014-12-02 VITALS — BP 115/61 | HR 71

## 2014-12-02 DIAGNOSIS — B351 Tinea unguium: Secondary | ICD-10-CM

## 2014-12-02 DIAGNOSIS — M79606 Pain in leg, unspecified: Secondary | ICD-10-CM

## 2014-12-02 NOTE — Progress Notes (Signed)
Subjective:  80 y.o. year old female patient presents accompanied by her husband complaining of painful nails.   Objective: Dermatologic: Thick yellow deformed nails x 10.  Vascular: Pedal pulses DP palpable on right and left is not palpable. PT is palpable on both ankles.  Orthopedic: Severe HAV with bunion. Subluxed first MPJ bilateral.  Contracted lesser digits bilateral.  Neurologic: All epicritic and tactile sensations grossly intact.   Assessment:  Dystrophic mycotic nails x 10.  Severe HAV with bunions, subluxed MPJ, and hammer toes bilateral.  Painful feet.  Treatment: All callus and mycotic nails debrided.  Return in 3 months or as needed.

## 2014-12-02 NOTE — Patient Instructions (Signed)
Seen for hypertrophic nails. All nails debrided. Return in 3 months or as needed.  

## 2014-12-13 ENCOUNTER — Telehealth: Payer: Self-pay | Admitting: Cardiology

## 2014-12-13 NOTE — Telephone Encounter (Signed)
[facility]

## 2014-12-13 NOTE — Telephone Encounter (Signed)
New Message      Pt calling stating she has a question about a medication and about a doctor. Please call back and advise.

## 2014-12-19 NOTE — Telephone Encounter (Signed)
okay

## 2014-12-21 DIAGNOSIS — Z23 Encounter for immunization: Secondary | ICD-10-CM | POA: Diagnosis not present

## 2014-12-22 ENCOUNTER — Other Ambulatory Visit: Payer: Self-pay | Admitting: Internal Medicine

## 2014-12-23 ENCOUNTER — Encounter: Payer: Self-pay | Admitting: Internal Medicine

## 2014-12-25 DIAGNOSIS — D3131 Benign neoplasm of right choroid: Secondary | ICD-10-CM | POA: Diagnosis not present

## 2014-12-25 DIAGNOSIS — H02831 Dermatochalasis of right upper eyelid: Secondary | ICD-10-CM | POA: Diagnosis not present

## 2014-12-25 DIAGNOSIS — H524 Presbyopia: Secondary | ICD-10-CM | POA: Diagnosis not present

## 2014-12-25 DIAGNOSIS — H25813 Combined forms of age-related cataract, bilateral: Secondary | ICD-10-CM | POA: Diagnosis not present

## 2015-01-03 ENCOUNTER — Other Ambulatory Visit: Payer: Self-pay | Admitting: Internal Medicine

## 2015-01-06 ENCOUNTER — Other Ambulatory Visit: Payer: Self-pay | Admitting: Internal Medicine

## 2015-01-06 NOTE — Telephone Encounter (Signed)
Left msg on triage needing refill on her [facility]ol [facility].../lmb

## 2015-02-04 ENCOUNTER — Ambulatory Visit (INDEPENDENT_AMBULATORY_CARE_PROVIDER_SITE_OTHER): Payer: Medicare Other | Admitting: Internal Medicine

## 2015-02-04 ENCOUNTER — Encounter: Payer: Self-pay | Admitting: Internal Medicine

## 2015-02-04 VITALS — BP 154/88 | HR 84 | Temp 97.8°F | Resp 18 | Wt 177.0 lb

## 2015-02-04 DIAGNOSIS — K219 Gastro-esophageal reflux disease without esophagitis: Secondary | ICD-10-CM | POA: Diagnosis not present

## 2015-02-04 DIAGNOSIS — D379 Neoplasm of uncertain behavior of digestive organ, unspecified: Secondary | ICD-10-CM

## 2015-02-04 DIAGNOSIS — E038 Other specified hypothyroidism: Secondary | ICD-10-CM

## 2015-02-04 DIAGNOSIS — D490 Neoplasm of unspecified behavior of digestive system: Secondary | ICD-10-CM

## 2015-02-04 DIAGNOSIS — J4520 Mild intermittent asthma, uncomplicated: Secondary | ICD-10-CM

## 2015-02-04 DIAGNOSIS — R600 Localized edema: Secondary | ICD-10-CM

## 2015-02-04 DIAGNOSIS — E785 Hyperlipidemia, unspecified: Secondary | ICD-10-CM

## 2015-02-04 DIAGNOSIS — M1990 Unspecified osteoarthritis, unspecified site: Secondary | ICD-10-CM

## 2015-02-04 DIAGNOSIS — I10 Essential (primary) hypertension: Secondary | ICD-10-CM

## 2015-02-04 DIAGNOSIS — M810 Age-related osteoporosis without current pathological fracture: Secondary | ICD-10-CM

## 2015-02-04 NOTE — Progress Notes (Signed)
Subjective:    Patient ID: Anna Adams, female    DOB: [birth year], 81 y.o.   MRN: [id number]  HPI She is here to establish with a new pcp.   GERD:  She has a large hiatal hernia.  She is taking her medication daily as prescribed.  She denies any GERD symptoms and feels her GERD is well controlled.   Edema:  She only takes lasix only as needed.  The ankle edema is worse in the left since her knee replacement. The swelling was worse in the summer.  She has not taken the lasix in a while.  She wears compression stockings.  She is not compliant with a low sodium diet.   Hypertension: She is taking her medication daily. She is not compliant with a low sodium diet.  She denies chest pain, palpitations, shortness of breath and regular headaches. She is exercising regularly.  She does not monitor her blood pressure at home, but it has always been well controlled.    Asthma:  She has seen pulmonary.  She uses her inhalers on occasion.  Pulmonary thought her lung symptoms were related more to her large hiatal hernia.  The inhaler does help when she uses it.    Osteoarthritis:  She takes Lodine daily.  She is bothered by her arthritic pain. She tries to do water aerobics three times a week, which is very helpful.    Hyperlipidemia: She is taking her medication daily. She is compliant with a low fat/cholesterol diet. She is exercising regularly.    Osteoporosis:  Takes vitamin D.  Does not take calcium (had high calcium in the past and had kidney stones).  Does not want to take a medication for osteoporosis.                                                                                                                      Medications and allergies reviewed with patient and updated if appropriate.  Patient Active Problem List   Diagnosis Date Noted  . Edema 11/26/2013  . Parotid tumor 03/23/2013  . Dyslipidemia   . Onychomycosis due to dermatophyte 05/05/2012  . Osteoporosis, post-menopausal     . GERD (gastroesophageal reflux disease)   . Benign hypertensive heart disease without heart failure 04/28/2011  . Osteoarthritis 01/08/2011  . Hypothyroidism 02/07/2009  . Essential hypertension 02/07/2009  . ALLERGIC RHINITIS 02/07/2009  . Intrinsic asthma 02/07/2009    Current Outpatient Prescriptions on File Prior to Visit  Medication Sig Dispense Refill  . albuterol (PROVENTIL HFA) 108 (90 BASE) MCG/ACT inhaler Inhale 1-2 puffs into the lungs every 6 (six) hours as needed for wheezing or shortness of breath. Must establish with NEW PCP for additional refills. 3 Inhaler 0  . atorvastatin (LIPITOR) 20 MG tablet Take 1 tablet (20 mg total) by mouth daily. 90 tablet 3  . b complex vitamins tablet Take 1 tablet by mouth daily.      . cholecalciferol (VITAMIN D) 1000 UNITS tablet  Take 1,000 Units by mouth daily.      Marland Kitchen etodolac (LODINE XL) 400 MG 24 hr tablet Take 1 tablet (400 mg total) by mouth daily. Must establish with NEW PCP for additional refills. 90 tablet 0  . famotidine (PEPCID) 20 MG tablet Take 1 tablet (20 mg total) by mouth daily. 90 tablet 3  . furosemide (LASIX) 20 MG tablet Take 1 tablet (20 mg total) by mouth daily as needed for fluid or edema. 30 tablet 3  . levothyroxine (SYNTHROID, LEVOTHROID) 150 MCG tablet Take 1 tablet (150 mcg total) by mouth daily before breakfast. 90 tablet 1  . losartan-hydrochlorothiazide (HYZAAR) 100-25 MG per tablet Take 1 tablet by mouth daily. 90 tablet 3  . Multiple Vitamin (MULTIVITAMIN) tablet Take 1 tablet by mouth daily.    . pneumococcal 13-valent conjugate vaccine (PREVNAR 13) SUSP injection Inject 0.5 mLs into the muscle tomorrow at 10 am. 0.5 mL 0  . PROVENTIL HFA 108 (90 BASE) MCG/ACT inhaler INHALE 1-2 PUFFS INTO THE LUNGS EVERY 6 (SIX) HOURS AS NEEDED FOR WHEEZING. 6.7 Inhaler 3  . vitamin E 200 UNIT capsule Take 200 Units by mouth daily.       No current facility-administered medications on file prior to visit.    Past  Medical History  Diagnosis Date  . Hypertension   . Parotid tumor     L side, s/p resection 1980 and 2007  . History of renal calculi   . Hypothyroidism   . Asthma   . ALLERGIC RHINITIS   . Asthma   . Osteoarthritis   . GERD (gastroesophageal reflux disease)     with HH  . Anemia   . Cataract     Past Surgical History  Procedure Laterality Date  . Knee arthroplasty  04/22/2006    DR. WQuillian Quince CAFFREY  . Tubal ligation    . Lumbar disc surgery    . Tonsillectomy and adenoidectomy    . Cardiovascular stress test  07/12/2005    EF 85%  . Ganglion cyst excision  1954 & 1963    (L) wrist x's 2  . Parotid gland tumor excision  1980, 2007    Left  . Cervical discectomy  1993  . Left knee replacement  2005  . Right knee replacement  2008    Social History   Social History  . Marital Status: Married    Spouse Name: N/A  . Number of Children: N/A  . Years of Education: N/A   Social History Main Topics  . Smoking status: Former Smoker    Quit date: 09/01/1962  . Smokeless tobacco: Never Used  . Alcohol Use: No  . Drug Use: No  . Sexual Activity: Not Asked   Other Topics Concern  . None   Social History Narrative   married, lives with spouse, retired Therapist, sports    Family History  Problem Relation Age of Onset  . Asthma Mother   . Cancer Mother   . Heart disease Mother   . Hypertension Father   . Heart disease Father   . Hyperlipidemia Father   . Stroke Father   . Mental illness Father   . Stroke Maternal Grandmother   . Heart disease Maternal Grandfather   . Hypertension Paternal Grandfather     Review of Systems  Constitutional: Negative for fever and chills.  Respiratory: Positive for shortness of breath (chronic) and wheezing (occasional). Negative for cough.   Cardiovascular: Positive for leg swelling (mild). Negative for chest pain  and palpitations.  Neurological: Positive for light-headedness (occasional). Negative for dizziness and headaches (rare  headaches).       Objective:   Filed Vitals:   02/04/15 1458  BP: 154/88  Pulse: 84  Temp: 97.8 F (36.6 C)  Resp: 18   Filed Weights   02/04/15 1458  Weight: 177 lb (80.287 kg)   Body mass index is 34.57 kg/(m^2).   Physical Exam Constitutional: Appears well-developed and well-nourished. No distress.  Neck: Neck supple. No tracheal deviation present. No thyromegaly present.  No carotid bruit. No cervical adenopathy.   Cardiovascular: Normal rate, regular rhythm and normal heart sounds.   No murmur heard.  ? Degree of edema today - wearing b/l compression stockings Pulmonary/Chest: Effort normal and breath sounds normal. No respiratory distress. No wheezes.       Assessment & Plan:   See Problem List for Assessment and Plan of chronic medical problems.  Follow up in 6 months

## 2015-02-04 NOTE — Patient Instructions (Signed)
  We have reviewed your prior records including labs and tests today.  Test(s) ordered today. Your results will be released to [place] (or called to you) after review, usually within 72hours after test completion. If any changes need to be made, you will be notified at that same time.  All other Health Maintenance issues reviewed.   All recommended immunizations and age-appropriate screenings are up-to-date.  No immunizations administered today.   Medications reviewed and updated.  No changes recommended at this time.   Please schedule followup in   6 months

## 2015-02-04 NOTE — Progress Notes (Signed)
Pre visit review using our clinic review tool, if applicable. No additional management support is needed unless otherwise documented below in the visit note. 

## 2015-02-05 NOTE — Assessment & Plan Note (Signed)
Benign S/p resection x 2

## 2015-02-05 NOTE — Assessment & Plan Note (Signed)
BP elevated here today but typically well controlled Continue current medication Stressed low sodium diet

## 2015-02-05 NOTE — Assessment & Plan Note (Addendum)
Has seen pulmonary Uses inhalers as needed Overall asthma has been controlled Some of her symptoms related to hiatal hernia, which is large

## 2015-02-05 NOTE — Assessment & Plan Note (Addendum)
Taking vitamin D Will not take calcium-had hypercalcemia in the past and has a history of kidney stones Declined bisphosphonate for osteoporosis She does exercise

## 2015-02-05 NOTE — Assessment & Plan Note (Signed)
And atorvastatin Lipid panel checked annually

## 2015-02-05 NOTE — Assessment & Plan Note (Signed)
Chronic, left leg worse than right Takes Lasix 20 mg daily CMP up-to-date No evidence of CHF, 2-D echo with normal LV function past Wears compression stockings

## 2015-02-05 NOTE — Assessment & Plan Note (Signed)
Related to large hiatal hernia Controlled with daily medication Continue pepcid daily

## 2015-02-05 NOTE — Assessment & Plan Note (Signed)
Taking Lodine daily Exercising regularly-water aerobics

## 2015-02-05 NOTE — Assessment & Plan Note (Addendum)
Clinically euthyroid Last TSH was slightly low, but this has been her baseline-we will recheck at her next visit Continue current dose of medication

## 2015-03-04 ENCOUNTER — Ambulatory Visit (INDEPENDENT_AMBULATORY_CARE_PROVIDER_SITE_OTHER): Payer: Medicare Other | Admitting: Podiatry

## 2015-03-04 ENCOUNTER — Encounter: Payer: Self-pay | Admitting: Podiatry

## 2015-03-04 VITALS — BP 110/62 | HR 70

## 2015-03-04 DIAGNOSIS — M79606 Pain in leg, unspecified: Secondary | ICD-10-CM | POA: Diagnosis not present

## 2015-03-04 DIAGNOSIS — B351 Tinea unguium: Secondary | ICD-10-CM | POA: Diagnosis not present

## 2015-03-04 NOTE — Patient Instructions (Signed)
Seen for hypertrophic nails. All nails debrided. Return in 3 months or as needed.  

## 2015-03-04 NOTE — Progress Notes (Signed)
Subjective: 81 y.o. year old female patient presents accompanied by her husband complaining of painful nails in closed in shoes and hurts to walk.   Objective: Dermatologic:Thick yellow deformed nails x 10.  Vascular:Pedal pulses DP palpable on right and left is not palpable. PT is palpable on both ankles.  Orthopedic:Severe HAV with bunion. Subluxed first MPJ bilateral.  Contracted lesser digits bilateral.  Neurologic:All epicritic and tactile sensations grossly intact.   Assessment: Dystrophic mycotic nails x 10.  Severe HAV with bunions, subluxed MPJ, and hammer toes bilateral.  Painful feet.  Treatment: All callus and mycotic nails debrided.  Return in 3 months or as needed. 

## 2015-03-12 DIAGNOSIS — L821 Other seborrheic keratosis: Secondary | ICD-10-CM | POA: Diagnosis not present

## 2015-03-12 DIAGNOSIS — L820 Inflamed seborrheic keratosis: Secondary | ICD-10-CM | POA: Diagnosis not present

## 2015-03-12 DIAGNOSIS — D1801 Hemangioma of skin and subcutaneous tissue: Secondary | ICD-10-CM | POA: Diagnosis not present

## 2015-03-12 DIAGNOSIS — L859 Epidermal thickening, unspecified: Secondary | ICD-10-CM | POA: Diagnosis not present

## 2015-03-12 DIAGNOSIS — D225 Melanocytic nevi of trunk: Secondary | ICD-10-CM | POA: Diagnosis not present

## 2015-03-14 ENCOUNTER — Encounter: Payer: Self-pay | Admitting: Cardiology

## 2015-03-14 NOTE — Telephone Encounter (Signed)
This encounter was created in error - please disregard.

## 2015-03-14 NOTE — Telephone Encounter (Signed)
New message      Pt has a question to ask Melinda

## 2015-04-04 ENCOUNTER — Other Ambulatory Visit: Payer: Self-pay | Admitting: *Deleted

## 2015-04-04 MED ORDER — ETODOLAC ER 400 MG PO TB24: 400.0000 mg | ORAL_TABLET | Freq: Every day | ORAL | Status: DC

## 2015-04-04 MED ORDER — LOSARTAN POTASSIUM-HCTZ 100-25 MG PO TABS: 1.0000 | ORAL_TABLET | Freq: Every day | ORAL | Status: DC

## 2015-04-04 MED ORDER — FAMOTIDINE 20 MG PO TABS: 20.0000 mg | ORAL_TABLET | Freq: Every day | ORAL | Status: DC

## 2015-04-04 MED ORDER — ATORVASTATIN CALCIUM 20 MG PO TABS: 20.0000 mg | ORAL_TABLET | Freq: Every day | ORAL | Status: DC

## 2015-04-04 NOTE — Telephone Encounter (Signed)
Receive call pt states mail service has change to [facility], and their needing new rx's sent on Hyzaar, Pepcid Lipitor. Inform pt will send electronically...Anna Adams

## 2015-04-29 ENCOUNTER — Ambulatory Visit (INDEPENDENT_AMBULATORY_CARE_PROVIDER_SITE_OTHER): Payer: Medicare Other | Admitting: Emergency Medicine

## 2015-04-29 ENCOUNTER — Ambulatory Visit (INDEPENDENT_AMBULATORY_CARE_PROVIDER_SITE_OTHER): Payer: Medicare Other

## 2015-04-29 VITALS — BP 138/72 | HR 93 | Temp 98.7°F | Resp 18 | Ht 58.5 in | Wt 174.0 lb

## 2015-04-29 DIAGNOSIS — R05 Cough: Secondary | ICD-10-CM

## 2015-04-29 DIAGNOSIS — R059 Cough, unspecified: Secondary | ICD-10-CM

## 2015-04-29 DIAGNOSIS — J189 Pneumonia, unspecified organism: Secondary | ICD-10-CM | POA: Diagnosis not present

## 2015-04-29 DIAGNOSIS — J9811 Atelectasis: Secondary | ICD-10-CM | POA: Diagnosis not present

## 2015-04-29 LAB — POCT CBC
Granulocyte percent: 79.8 %G (ref 37–80)
HCT, POC: 38.8 % (ref 37.7–47.9)
HEMOGLOBIN: 13.5 g/dL (ref 12.2–16.2)
LYMPH, POC: 1 (ref 0.6–3.4)
MCH, POC: 29.3 pg (ref 27–31.2)
MCHC: 34.9 g/dL (ref 31.8–35.4)
MCV: 84 fL (ref 80–97)
MID (cbc): 0.5 (ref 0–0.9)
MPV: 7.1 fL (ref 0–99.8)
POC Granulocyte: 6.1 (ref 2–6.9)
POC LYMPH PERCENT: 13.3 %L (ref 10–50)
POC MID %: 6.9 % (ref 0–12)
Platelet Count, POC: 211 10*3/uL (ref 142–424)
RBC: 4.63 M/uL (ref 4.04–5.48)
RDW, POC: 13.4 %
WBC: 7.6 10*3/uL (ref 4.6–10.2)

## 2015-04-29 LAB — POCT INFLUENZA A/B
INFLUENZA A, POC: NEGATIVE
INFLUENZA B, POC: NEGATIVE

## 2015-04-29 LAB — POCT RAPID STREP A (OFFICE): Rapid Strep A Screen: NEGATIVE

## 2015-04-29 IMAGING — CR DG CHEST 2V
2 series · 2 of 2 positions shown · non-contrast
Comparison: [DATE]

CLINICAL DATA: Cough for 3 day

EXAM:
CHEST  2 VIEW

[PA]
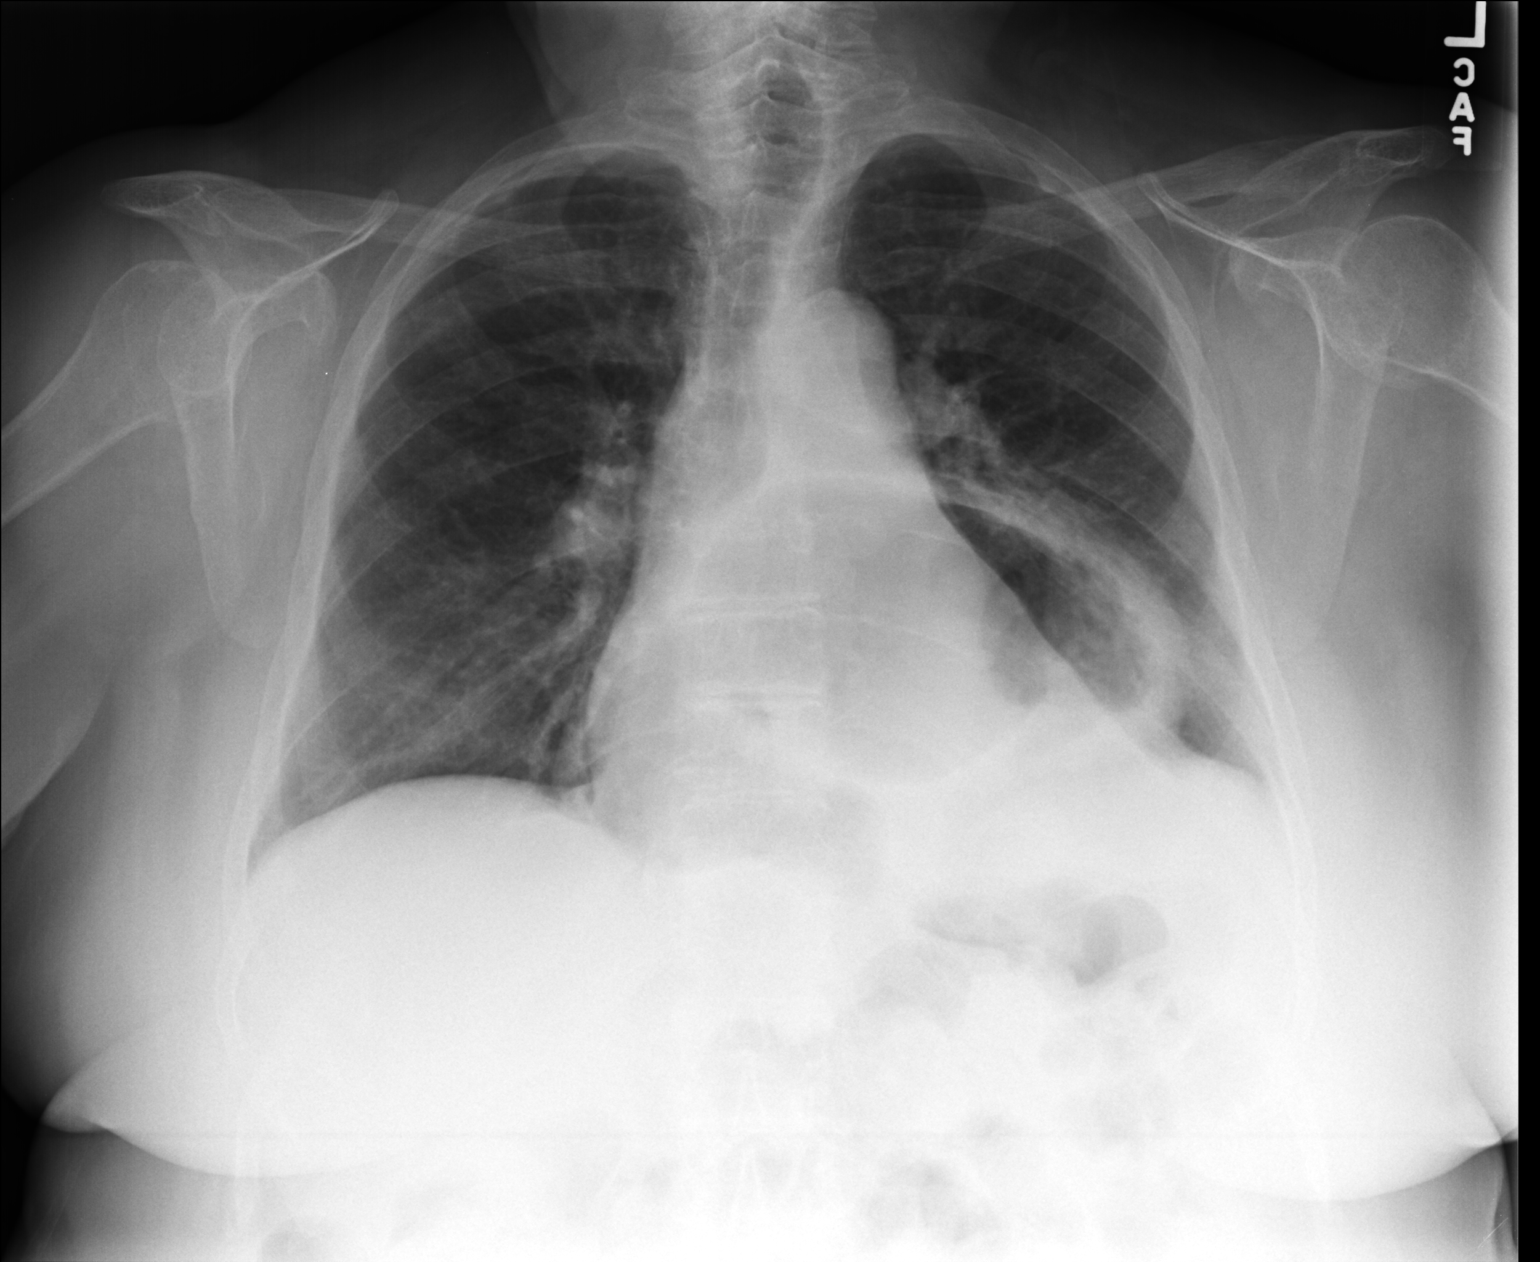

[lateral]
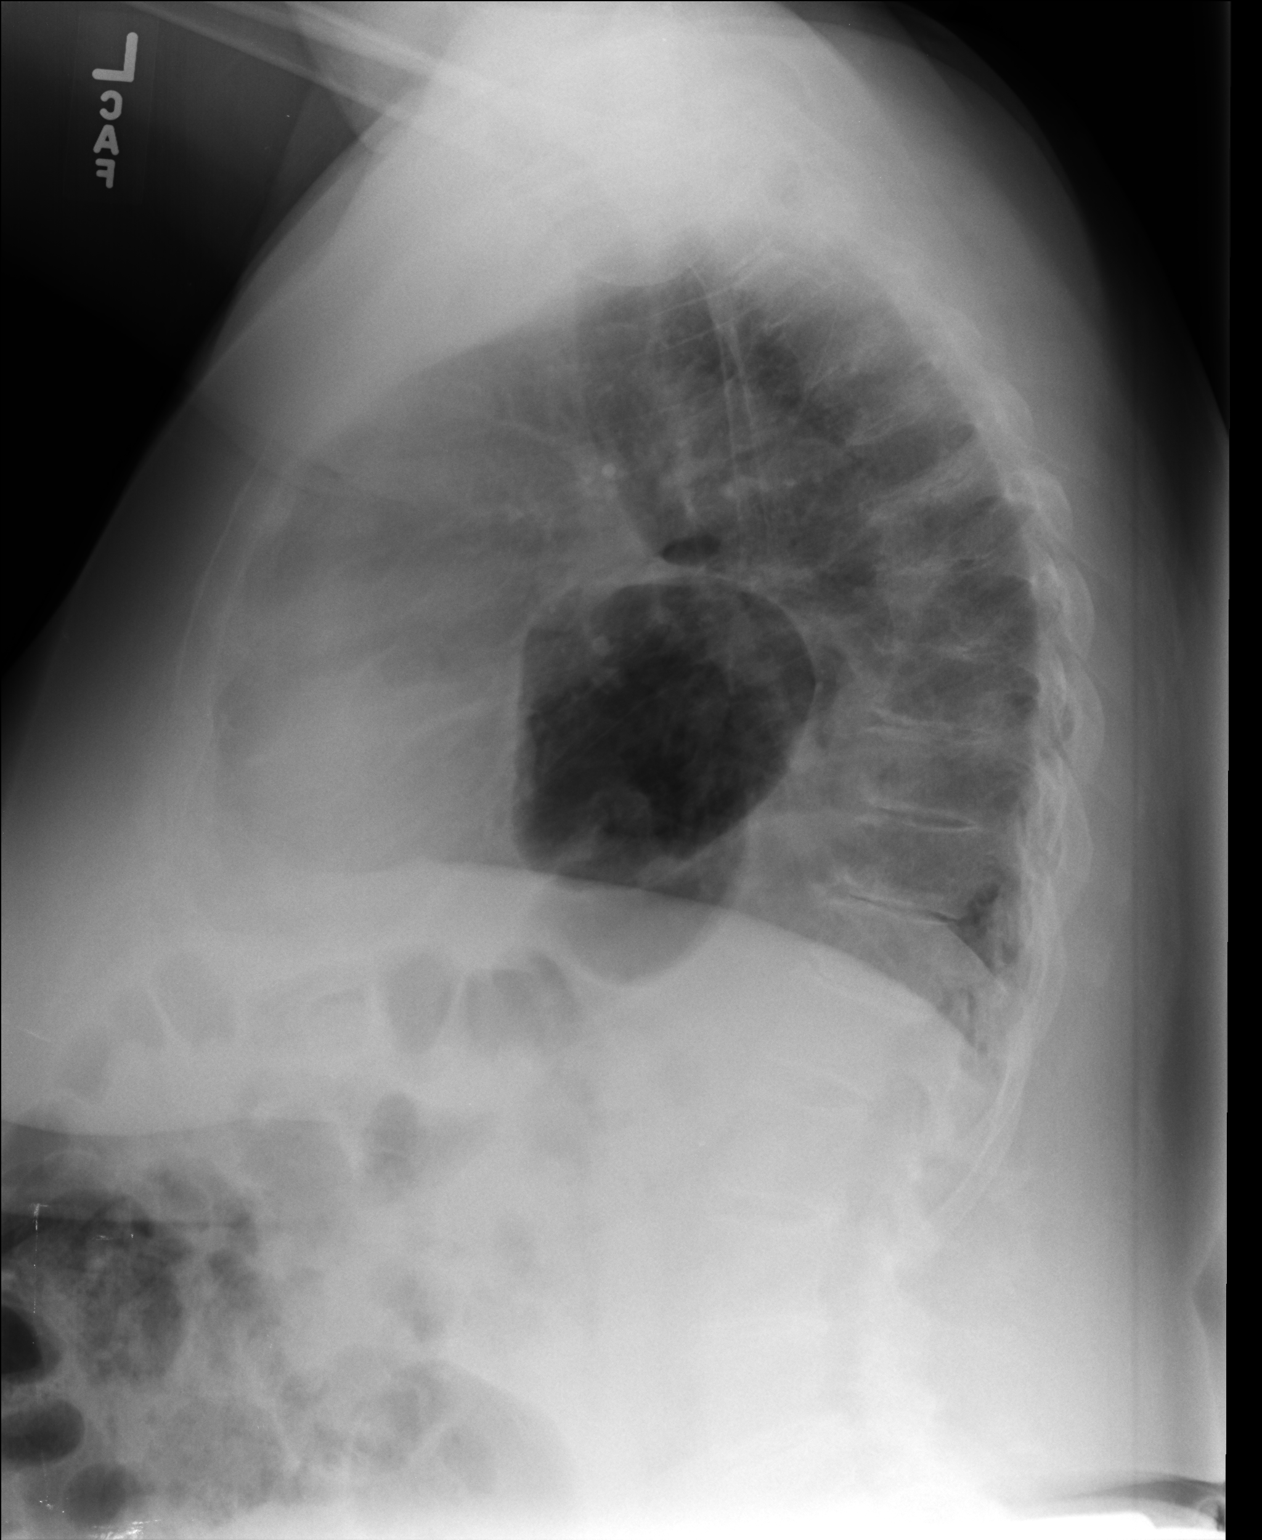

[2 of 2 positions shown; findings below may reference images not displayed]

FINDINGS: Large hiatal hernia is stable. There is associated subsegmental
atelectasis. Upper lungs clear. Normal heart size. No pneumothorax
or pleural effusion.
IMPRESSION: Bibasilar atelectasis.

## 2015-04-29 MED ORDER — ALBUTEROL SULFATE (2.5 MG/3ML) 0.083% IN NEBU
2.5000 mg | INHALATION_SOLUTION | Freq: Once | RESPIRATORY_TRACT | Status: AC
  Administered 2015-04-29: 2.5 mg via RESPIRATORY_TRACT

## 2015-04-29 MED ORDER — AZITHROMYCIN 250 MG PO TABS: ORAL_TABLET | ORAL | Status: DC

## 2015-04-29 MED ORDER — BENZONATATE 100 MG PO CAPS: 100.0000 mg | ORAL_CAPSULE | Freq: Three times a day (TID) | ORAL | Status: DC | PRN

## 2015-04-29 NOTE — Patient Instructions (Addendum)
   IF you received an x-ray today, you will receive an invoice from [facility]. Please contact [facility] at [phone] with questions or concerns regarding your invoice.   IF you received labwork today, you will receive an invoice from [facility]. Please contact [facility] at [phone] with questions or concerns regarding your invoice.   Our billing staff will not be able to assist you with questions regarding bills from these companies.  You will be contacted with the lab results as soon as they are available. The fastest [place] to get your results is to activate your My Chart account. Instructions are located on the last page of this paperwork. If you have not heard from us regarding the results in 2 weeks, please contact this office.      Community-Acquired Pneumonia, Adult Pneumonia is an infection of the lungs. There are different types of pneumonia. One type can develop while a person is in a hospital. A different type, called community-acquired pneumonia, develops in people who are not, or have not recently been, in the hospital or other health care facility.  CAUSES Pneumonia may be caused by bacteria, viruses, or funguses. Community-acquired pneumonia is often caused by Streptococcus pneumonia bacteria. These bacteria are often passed from one person to another by breathing in droplets from the cough or sneeze of an infected person. RISK FACTORS The condition is more likely to develop in:  People who havechronic diseases, such as chronic obstructive pulmonary disease (COPD), asthma, congestive heart failure, cystic fibrosis, diabetes, or kidney disease.  People who haveearly-stage or late-stage HIV.  People who havesickle cell disease.  People who havehad their spleen removed (splenectomy).  People who havepoor dental hygiene.  People who havemedical conditions that increase the risk of breathing in (aspirating)  secretions their own mouth and nose.   People who havea weakened immune system (immunocompromised).  People who smoke.  People whotravel to areas where pneumonia-causing germs commonly exist.  People whoare around animal habitats or animals that have pneumonia-causing germs, including birds, bats, rabbits, cats, and farm animals. SYMPTOMS Symptoms of this condition include:  Adry cough.  A wet (productive) cough.  Fever.  Sweating.  Chest pain, especially when breathing deeply or coughing.  Rapid breathing or difficulty breathing.  Shortness of breath.  Shaking chills.  Fatigue.  Muscle aches. DIAGNOSIS Your health care provider will take a medical history and perform a physical exam. You may also have other tests, including:  Imaging studies of your chest, including X-rays.  Tests to check your blood oxygen level and other blood gases.  Other tests on blood, mucus (sputum), fluid around your lungs (pleural fluid), and urine. If your pneumonia is severe, other tests may be done to identify the specific cause of your illness. TREATMENT The type of treatment that you receive depends on many factors, such as the cause of your pneumonia, the medicines you take, and other medical conditions that you have. For most adults, treatment and recovery from pneumonia may occur at home. In some cases, treatment must happen in a hospital. Treatment may include:  Antibiotic medicines, if the pneumonia was caused by bacteria.  Antiviral medicines, if the pneumonia was caused by a virus.  Medicines that are given by mouth or through an IV tube.  Oxygen.  Respiratory therapy. Although rare, treating severe pneumonia may include:  Mechanical ventilation. This is done if you are not breathing well on your own and you cannot maintain a safe blood oxygen level.    Thoracentesis. This procedureremoves fluid around one lung or both lungs to help you breathe better. HOME CARE  INSTRUCTIONS  Take over-the-counter and prescription medicines only as told by your health care provider.  Only takecough medicine if you are losing sleep. Understand that cough medicine can prevent your body's natural ability to remove mucus from your lungs.  If you were prescribed an antibiotic medicine, take it as told by your health care provider. Do not stop taking the antibiotic even if you start to feel better.  Sleep in a semi-upright position at night. Try sleeping in a reclining chair, or place a few pillows under your head.  Do not use tobacco products, including cigarettes, chewing tobacco, and e-cigarettes. If you need help quitting, ask your health care provider.  Drink enough water to keep your urine clear or pale yellow. This will help to thin out mucus secretions in your lungs. PREVENTION There are ways that you can decrease your risk of developing community-acquired pneumonia. Consider getting a pneumococcal vaccine if:  You are older than 79 years of age.  You are older than 79 years of age and are undergoing cancer treatment, have chronic lung disease, or have other medical conditions that affect your immune system. Ask your health care provider if this applies to you. There are different types and schedules of pneumococcal vaccines. Ask your health care provider which vaccination option is best for you. You may also prevent community-acquired pneumonia if you take these actions:  Get an influenza vaccine every year. Ask your health care provider which type of influenza vaccine is best for you.  Go to the dentist on a regular basis.  Wash your hands often. Use hand sanitizer if soap and water are not available. SEEK MEDICAL CARE IF:  You have a fever.  You are losing sleep because you cannot control your cough with cough medicine. SEEK IMMEDIATE MEDICAL CARE IF:  You have worsening shortness of breath.  You have increased chest pain.  Your sickness becomes  worse, especially if you are an older adult or have a weakened immune system.  You cough up blood.   This information is not intended to replace advice given to you by your health care provider. Make sure you discuss any questions you have with your health care provider.   Document Released: 01/11/2005 Document Revised: 10/02/2014 Document Reviewed: 05/08/2014 Elsevier Interactive Patient Education 2016 Elsevier Inc.  

## 2015-04-29 NOTE — Progress Notes (Addendum)
By signing my name below, I, Moises Blood, attest that this documentation has been prepared under the direction and in the presence of Arlyss Queen, MD. Electronically Signed: Moises Blood, [place]. 04/29/2015 , 12:34 PM .  Patient was seen in room 9 .  Chief Complaint:  Chief Complaint  Patient presents with  . Cough    x 2-3 days  . Sore Throat  . Headache  . Nasal Congestion    HPI: Anna Adams is a 81 y.o. female who reports to [facility] today complaining of cough with scratchy throat that started 2 days ago. She had some violent coughs, which she described as "bronchial spasms" last night. She had a slight headache this morning. She denies coughing up much phlegm. She denies any fever or myalgia.   She's been taking care of her husband who has CAP.   Past Medical History  Diagnosis Date  . Hypertension   . Parotid tumor     L side, s/p resection 1980 and 2007  . History of renal calculi   . Hypothyroidism   . Asthma   . ALLERGIC RHINITIS   . Asthma   . Osteoarthritis   . GERD (gastroesophageal reflux disease)     with HH  . Anemia   . Cataract    Past Surgical History  Procedure Laterality Date  . Knee arthroplasty  04/22/2006    DR. WQuillian Quince CAFFREY  . Tubal ligation    . Lumbar disc surgery    . Tonsillectomy and adenoidectomy    . Cardiovascular stress test  07/12/2005    EF 85%  . Ganglion cyst excision  1954 & 1963    (L) wrist x's 2  . Parotid gland tumor excision  1980, 2007    Left  . Cervical discectomy  1993  . Left knee replacement  2005  . Right knee replacement  2008   Social History   Social History  . Marital Status: Married    Spouse Name: N/A  . Number of Children: N/A  . Years of Education: N/A   Social History Main Topics  . Smoking status: Former Smoker    Quit date: 09/01/1962  . Smokeless tobacco: Never Used  . Alcohol Use: No  . Drug Use: No  . Sexual Activity: Not Asked   Other Topics Concern  . None   Social  History Narrative   married, lives with spouse, retired Therapist, sports   Family History  Problem Relation Age of Onset  . Asthma Mother   . Cancer Mother   . Heart disease Mother   . Hypertension Father   . Heart disease Father   . Hyperlipidemia Father   . Stroke Father   . Mental illness Father   . Stroke Maternal Grandmother   . Heart disease Maternal Grandfather   . Hypertension Paternal Grandfather    Allergies  Allergen Reactions  . Dyphylline-Guaifenesin   . Erythromycin   . Mevacor [Lovastatin]     myalgias  . Other     CT scan dye   Prior to Admission medications   Medication Sig Start Date End Date Taking? Authorizing Provider  albuterol (PROVENTIL HFA) 108 (90 BASE) MCG/ACT inhaler Inhale 1-2 puffs into the lungs every 6 (six) hours as needed for wheezing or shortness of breath. Must establish with NEW PCP for additional refills. 01/06/15  Yes Rowe Clack, MD  atorvastatin (LIPITOR) 20 MG tablet Take 1 tablet (20 mg total) by mouth daily. 04/04/15  Yes Binnie Rail, MD  b complex vitamins tablet Take 1 tablet by mouth daily.     Yes Historical Provider, MD  cholecalciferol (VITAMIN D) 1000 UNITS tablet Take 1,000 Units by mouth daily.     Yes Historical Provider, MD  etodolac (LODINE XL) 400 MG 24 hr tablet Take 1 tablet (400 mg total) by mouth daily. 04/04/15  Yes Binnie Rail, MD  famotidine (PEPCID) 20 MG tablet Take 1 tablet (20 mg total) by mouth daily. 04/04/15  Yes Binnie Rail, MD  levothyroxine (SYNTHROID, LEVOTHROID) 150 MCG tablet Take 1 tablet (150 mcg total) by mouth daily before breakfast. 07/30/14  Yes Rowe Clack, MD  losartan-hydrochlorothiazide (HYZAAR) 100-25 MG tablet Take 1 tablet by mouth daily. 04/04/15  Yes Binnie Rail, MD  Multiple Vitamin (MULTIVITAMIN) tablet Take 1 tablet by mouth daily.   Yes Historical Provider, MD  PROVENTIL HFA 108 (90 BASE) MCG/ACT inhaler INHALE 1-2 PUFFS INTO THE LUNGS EVERY 6 (SIX) HOURS AS NEEDED FOR WHEEZING.  01/06/15  Yes Rowe Clack, MD  UNABLE TO FIND at bedtime. Osteo-ease   Yes Historical Provider, MD  vitamin E 200 UNIT capsule Take 200 Units by mouth daily.     Yes Historical Provider, MD  furosemide (LASIX) 20 MG tablet Take 1 tablet (20 mg total) by mouth daily as needed for fluid or edema. Patient not taking: Reported on 04/29/2015 11/26/13   Rowe Clack, MD  pneumococcal 13-valent conjugate vaccine (PREVNAR 13) SUSP injection Inject 0.5 mLs into the muscle tomorrow at 10 am. 10/25/14   Rowe Clack, MD     ROS:   Constitutional: negative for fever, chills, night sweats, weight changes, or fatigue HEENT: negative for vision changes, hearing loss, rhinorrhea, epistaxis, or sinus pressure; positive for congestion, sore throat Cardiovascular: negative for chest pain or palpitations Respiratory: negative for hemoptysis, wheezing, shortness of breath; positive for cough Abdominal: negative for abdominal pain, nausea, vomiting, diarrhea, or constipation Dermatological: negative for rash Neurologic: negative for dizziness, or syncope; positive for headache All other systems reviewed and are otherwise negative with the exception to those above and in the HPI.  PHYSICAL EXAM: Filed Vitals:   04/29/15 1132  BP: 138/72  Pulse: 93  Temp: 98.7 F (37.1 C)  Resp: 18   Body mass index is 35.74 kg/(m^2).   General: Alert, no acute distress HEENT:  Normocephalic, atraumatic, oropharynx patent; Scars on left side of her face and neck, throat nl Eye: EOMI, PEERLDC Cardiovascular:  Regular rate and rhythm, no rubs murmurs or gallops.  No Carotid bruits, radial pulse intact. No pedal edema.  Respiratory: Clear to auscultation bilaterally.  No wheezes, rales, or rhonchi.  No cyanosis, no use of accessory musculature Abdominal: No organomegaly, abdomen is soft and non-tender, positive bowel sounds. No masses. Musculoskeletal: Gait intact. No edema, tenderness Skin: No  rashes. Neurologic: Facial musculature symmetric. Psychiatric: Patient acts appropriately throughout our interaction.  Lymphatic: No cervical or submandibular lymphadenopathy Genitourinary/Anorectal: No acute findings Pulse ox initially 93 with ambulation dropped to 89. LABS: Results for orders placed or performed in visit on 04/29/15  POCT Influenza A/B  Result Value Ref Range   Influenza A, POC Negative Negative   Influenza B, POC Negative Negative  POCT rapid strep A  Result Value Ref Range   Rapid Strep A Screen Negative Negative    EKG/XRAY:   Dg Chest 2 View  04/29/2015  CLINICAL DATA:  Cough for 3 day EXAM: CHEST  2  VIEW COMPARISON:  04/20/2010 FINDINGS: Large hiatal hernia is stable. There is associated subsegmental atelectasis. Upper lungs clear. Normal heart size. No pneumothorax or pleural effusion. IMPRESSION: Bibasilar atelectasis. Electronically Signed   By: Marybelle Killings M.D.   On: 04/29/2015 12:48    ASSESSMENT/PLAN: We'll treat with a Z-Pak, Tessalon Perles, and Mucinex. She can use her albuterol as needed. If she has worsening shortness of breath she needs to go to the emergency room.I personally performed the services described in this documentation, which was scribed in my presence. The recorded information has been reviewed and is accurate. She is aware that option level was on the low and. With ambulation she can drop as low as 89-90. She did not have any visible shortness of breath. She knows to present to the emergency room if she had worsening with her breathing.I personally performed the services described in this documentation, which was scribed in my presence. The recorded information has been reviewed and is accurate.   Gross sideeffects, risk and benefits, and alternatives of medications d/w patient. Patient is aware that all medications have potential sideeffects and we are unable to predict every sideeffect or drug-drug interaction that may occur.  Arlyss Queen MD 04/29/2015 12:34 PM

## 2015-05-02 ENCOUNTER — Ambulatory Visit (INDEPENDENT_AMBULATORY_CARE_PROVIDER_SITE_OTHER): Payer: Medicare Other | Admitting: Physician Assistant

## 2015-05-02 VITALS — BP 148/70 | HR 85 | Temp 98.1°F | Resp 16 | Ht 58.5 in | Wt 173.6 lb

## 2015-05-02 DIAGNOSIS — J189 Pneumonia, unspecified organism: Secondary | ICD-10-CM

## 2015-05-02 DIAGNOSIS — Z09 Encounter for follow-up examination after completed treatment for conditions other than malignant neoplasm: Secondary | ICD-10-CM | POA: Diagnosis not present

## 2015-05-02 NOTE — Patient Instructions (Addendum)
     IF you received an x-ray today, you will receive an invoice from [facility]. Please contact [facility] at [phone] with questions or concerns regarding your invoice.   IF you received labwork today, you will receive an invoice from [employer]. Please contact [facility] at [phone] with questions or concerns regarding your invoice.   Our billing staff will not be able to assist you with questions regarding bills from these companies.  You will be contacted with the lab results as soon as they are available. The fastest [place] to get your results is to activate your My Chart account. Instructions are located on the last page of this paperwork. If you have not heard from [place] regarding the results in 2 weeks, please contact this office.    You look like you are improving greatly.  Please take medications as prescribed. It is fine to use your albuterol if you feel short of breath or wheezing.  If you find that you need the albuterol more than every 4 hours, you need to return.

## 2015-05-25 NOTE — Progress Notes (Signed)
[facility] [address] [place] [address] [phone]  Date:  05/02/2015   Name:  Anna Adams   DOB:  1934-10-27   MRN:  [id number]  PCP:  Binnie Rail, MD   Chief Complaint  Patient presents with  . Follow-up    Cough    History of Present Illness:  Anna Adams is a 81 y.o. female patient who presents to [facility] for follow up of cough. Patient was seen here 3 days ago with a diagnosed pneumonia by Dr. Everlene Farrier.  She was placed on Zpak, supportive treatment and albuterol at home.  During her visit o2 sat fell as low as 87 with ambulation, and she was advised to follow up. She reports that she is feeling much better.  Her breathing has greatly improved, and she is able to ambulate without much shortness of breath.  She reports no fever.  No excessive coughing.         Patient Active Problem List   Diagnosis Date Noted  . Pain in lower limb 03/04/2015  . Edema 11/26/2013  . Parotid tumor 03/23/2013  . Dyslipidemia   . Onychomycosis due to dermatophyte 05/05/2012  . Osteoporosis, post-menopausal   . GERD (gastroesophageal reflux disease)   . Benign hypertensive heart disease without heart failure 04/28/2011  . Osteoarthritis 01/08/2011  . Hypothyroidism 02/07/2009  . Essential hypertension 02/07/2009  . ALLERGIC RHINITIS 02/07/2009  . Intrinsic asthma 02/07/2009    Past Medical History  Diagnosis Date  . Hypertension   . Parotid tumor     L side, s/p resection 1980 and 2007  . History of renal calculi   . Hypothyroidism   . Asthma   . ALLERGIC RHINITIS   . Asthma   . Osteoarthritis   . GERD (gastroesophageal reflux disease)     with HH  . Anemia   . Cataract     Past Surgical History  Procedure Laterality Date  . Knee arthroplasty  04/22/2006    DR. WQuillian Quince CAFFREY  . Tubal ligation    . Lumbar disc surgery    . Tonsillectomy and adenoidectomy    . Cardiovascular stress test  07/12/2005    EF 85%  . Ganglion cyst excision  1954 &  1963    (L) wrist x's 2  . Parotid gland tumor excision  1980, 2007    Left  . Cervical discectomy  1993  . Left knee replacement  2005  . Right knee replacement  2008    Social History  Substance Use Topics  . Smoking status: Former Smoker    Quit date: 09/01/1962  . Smokeless tobacco: Never Used  . Alcohol Use: No    Family History  Problem Relation Age of Onset  . Asthma Mother   . Cancer Mother   . Heart disease Mother   . Hypertension Father   . Heart disease Father   . Hyperlipidemia Father   . Stroke Father   . Mental illness Father   . Stroke Maternal Grandmother   . Heart disease Maternal Grandfather   . Hypertension Paternal Grandfather     Allergies  Allergen Reactions  . Dyphylline-Guaifenesin   . Erythromycin Nausea Only  . Mevacor [Lovastatin]     myalgias  . Other     CT scan dye    Medication list has been reviewed and updated.  Current Outpatient Prescriptions on File Prior to Visit  Medication Sig Dispense Refill  . albuterol (  PROVENTIL HFA) 108 (90 BASE) MCG/ACT inhaler Inhale 1-2 puffs into the lungs every 6 (six) hours as needed for wheezing or shortness of breath. Must establish with NEW PCP for additional refills. 3 Inhaler 0  . atorvastatin (LIPITOR) 20 MG tablet Take 1 tablet (20 mg total) by mouth daily. 90 tablet 3  . azithromycin (ZITHROMAX) 250 MG tablet Take 2 tabs PO x 1 dose, then 1 tab PO QD x 4 days 6 tablet 0  . b complex vitamins tablet Take 1 tablet by mouth daily.      . benzonatate (TESSALON) 100 MG capsule Take 1-2 capsules (100-200 mg total) by mouth 3 (three) times daily as needed for cough. 40 capsule 0  . cholecalciferol (VITAMIN D) 1000 UNITS tablet Take 1,000 Units by mouth daily.      Marland Kitchen etodolac (LODINE XL) 400 MG 24 hr tablet Take 1 tablet (400 mg total) by mouth daily. 90 tablet 3  . famotidine (PEPCID) 20 MG tablet Take 1 tablet (20 mg total) by mouth daily. 90 tablet 3  . levothyroxine (SYNTHROID, LEVOTHROID) 150  MCG tablet Take 1 tablet (150 mcg total) by mouth daily before breakfast. 90 tablet 1  . losartan-hydrochlorothiazide (HYZAAR) 100-25 MG tablet Take 1 tablet by mouth daily. 90 tablet 3  . Multiple Vitamin (MULTIVITAMIN) tablet Take 1 tablet by mouth daily.    . pneumococcal 13-valent conjugate vaccine (PREVNAR 13) SUSP injection Inject 0.5 mLs into the muscle tomorrow at 10 am. 0.5 mL 0  . PROVENTIL HFA 108 (90 BASE) MCG/ACT inhaler INHALE 1-2 PUFFS INTO THE LUNGS EVERY 6 (SIX) HOURS AS NEEDED FOR WHEEZING. 6.7 Inhaler 3  . UNABLE TO FIND at bedtime. Osteo-ease    . vitamin E 200 UNIT capsule Take 200 Units by mouth daily.      . furosemide (LASIX) 20 MG tablet Take 1 tablet (20 mg total) by mouth daily as needed for fluid or edema. (Patient not taking: Reported on 04/29/2015) 30 tablet 3   No current facility-administered medications on file prior to visit.    ROS ROS otherwise urnemarkable unless listed above  Physical Examination: BP 148/70 mmHg  Pulse 85  Temp(Src) 98.1 F (36.7 C) (Oral)  Resp 16  Ht 4' 10.5" (1.486 m)  Wt 173 lb 9.6 oz (78.744 kg)  BMI 35.66 kg/m2  SpO2 97% Ideal Body Weight: Weight in (lb) to have BMI = 25: 121.4  Physical Exam  Constitutional: She is oriented to person, place, and time. She appears well-developed and well-nourished. No distress.  HENT:  Head: Normocephalic and atraumatic.  Right Ear: Tympanic membrane, external ear and ear canal normal.  Left Ear: Tympanic membrane, external ear and ear canal normal.  Nose: Mucosal edema and rhinorrhea present. Right sinus exhibits no maxillary sinus tenderness and no frontal sinus tenderness. Left sinus exhibits no maxillary sinus tenderness and no frontal sinus tenderness.  Mouth/Throat: No uvula swelling. No oropharyngeal exudate, posterior oropharyngeal edema or posterior oropharyngeal erythema.  Eyes: Conjunctivae and EOM are normal. Pupils are equal, round, and reactive to light.  Cardiovascular:  Normal rate and regular rhythm.  Exam reveals no gallop, no distant heart sounds and no friction rub.   No murmur heard. Pulmonary/Chest: Effort normal. No respiratory distress. She has no decreased breath sounds. She has no wheezes. She has no rhonchi.  Lymphadenopathy:       Head (right side): No submandibular, no tonsillar, no preauricular and no posterior auricular adenopathy present.       Head (  left side): No submandibular, no tonsillar, no preauricular and no posterior auricular adenopathy present.  Neurological: She is alert and oriented to person, place, and time.  Skin: She is not diaphoretic.  Psychiatric: She has a normal mood and affect. Her behavior is normal.     Assessment and Plan: Arelyn Swofford Wichert is a 81 y.o. female who is here today for follow up of CAP This appears to be resolving.  She understands to report back or go to the Ed if she exhibits dyspnea or sob.  Advised to finish abx to completion and follow same treatment plan.  Ambulatory with O2 sat at a normal range. Patient hx, exam and treatment was also performed by Dr. Everlene Farrier.  Follow up  CAP (community acquired pneumonia)  Ivar Drape, PA-C [facility] 05/25/2015 12:30 PM

## 2015-06-02 ENCOUNTER — Encounter: Payer: Self-pay | Admitting: Podiatry

## 2015-06-02 ENCOUNTER — Ambulatory Visit (INDEPENDENT_AMBULATORY_CARE_PROVIDER_SITE_OTHER): Payer: Medicare Other | Admitting: Podiatry

## 2015-06-02 VITALS — BP 108/67 | HR 67

## 2015-06-02 DIAGNOSIS — M79606 Pain in leg, unspecified: Secondary | ICD-10-CM

## 2015-06-02 DIAGNOSIS — B351 Tinea unguium: Secondary | ICD-10-CM

## 2015-06-02 NOTE — Progress Notes (Signed)
Subjective:  81 y.o. year old female patient presents accompanied by her husband complaining of painful nails in closed in shoes and hurts to walk.   Objective: Dermatologic: Thick yellow deformed nails x 10.  Vascular: Pedal pulses DP palpable on right and left is not palpable. PT is palpable on both ankles.  Orthopedic: Severe HAV with bunion. Subluxed first MPJ bilateral.  Contracted lesser digits bilateral.  Neurologic: All epicritic and tactile sensations grossly intact.   Assessment:  Dystrophic mycotic nails x 10.  Severe HAV with bunions, subluxed MPJ, and hammer toes bilateral.  Painful feet.  Treatment: All callus and mycotic nails debrided.  Return in 3 months or as needed.

## 2015-06-02 NOTE — Patient Instructions (Signed)
Seen for hypertrophic nails. All nails debrided. Return in 3 months or as needed.  

## 2015-06-12 DIAGNOSIS — M25561 Pain in right knee: Secondary | ICD-10-CM | POA: Diagnosis not present

## 2015-07-16 ENCOUNTER — Telehealth: Payer: Self-pay | Admitting: Internal Medicine

## 2015-07-16 NOTE — Telephone Encounter (Signed)
[facility]  Patient Name: Anna Adams  DOB: 1934/08/31    Initial Comment Anna Adams states she is having a problem w/ hemorrhoids.   Nurse Assessment  Nurse: Harlow Mares, RN, Suanne Marker Date/Time (Eastern Time): 07/16/2015 3:17:49 PM  Confirm and document reason for call. If symptomatic, describe symptoms. You must click the next button to save text entered. ---Anna Adams states she is having a problem w/ hemorrhoids. Reports that she is having painful rectal hemorrhoids which have become worse since Sat. She is having a hard time getting comfortable. She is using Prep H. and Lidocaine get. She has not had any relief. She is feeling burning. Reports that she took tylenol and was able to get comfortable enough to manage. Reports that the hemorrhoid is about the size of her thumb. She has taken a sitz bath, but she has a hard time getting in/out of the tub.  Has the patient traveled out of the country within the last 30 days? ---Not Applicable  Does the patient have any new or worsening symptoms? ---Yes  Will a triage be completed? ---Yes  Related visit to physician within the last 2 weeks? ---No  Does the PT have any chronic conditions? (i.e. diabetes, asthma, etc.) ---Yes  List chronic conditions. ---HTN, high chol, asthma  Is this a behavioral health or substance abuse call? ---No     Guidelines    Guideline Title Affirmed Question Affirmed Notes  Rectal Symptoms SEVERE rectal pain (e.g., excruciating, unable to have a bowel movement)    Final Disposition User   See Physician within 4 Hours (or PCP triage) Harlow Mares, RN, Rhonda    Comments  Caller reports that she is unable to go to the MD office today (no appts available today), but she would agree to be seen tomorrow afternoon. Appt scheduled with Dr. Ronnald Ramp at 2:30p on 07/16/15 at the [facility] office, caller voiced understanding.   Referrals  REFERRED TO PCP OFFICE   Disagree/Comply: Disagree  Disagree/Comply  Reason: Wait and see

## 2015-07-17 ENCOUNTER — Encounter (HOSPITAL_COMMUNITY): Payer: Self-pay

## 2015-07-17 ENCOUNTER — Emergency Department (HOSPITAL_COMMUNITY)
Admission: EM | Admit: 2015-07-17 | Discharge: 2015-07-17 | Disposition: A | Discharge status: Home / Self Care | Payer: Medicare Other | Attending: Emergency Medicine | Admitting: Emergency Medicine

## 2015-07-17 ENCOUNTER — Encounter: Payer: Self-pay | Admitting: Internal Medicine

## 2015-07-17 ENCOUNTER — Ambulatory Visit (INDEPENDENT_AMBULATORY_CARE_PROVIDER_SITE_OTHER): Payer: Medicare Other | Admitting: Internal Medicine

## 2015-07-17 VITALS — BP 140/90 | HR 81 | Temp 98.3°F | Resp 161 | Ht 58.5 in | Wt 173.0 lb

## 2015-07-17 DIAGNOSIS — Z87891 Personal history of nicotine dependence: Secondary | ICD-10-CM | POA: Insufficient documentation

## 2015-07-17 DIAGNOSIS — M1990 Unspecified osteoarthritis, unspecified site: Secondary | ICD-10-CM | POA: Insufficient documentation

## 2015-07-17 DIAGNOSIS — I10 Essential (primary) hypertension: Secondary | ICD-10-CM | POA: Insufficient documentation

## 2015-07-17 DIAGNOSIS — Z7951 Long term (current) use of inhaled steroids: Secondary | ICD-10-CM | POA: Diagnosis not present

## 2015-07-17 DIAGNOSIS — Z79899 Other long term (current) drug therapy: Secondary | ICD-10-CM | POA: Insufficient documentation

## 2015-07-17 DIAGNOSIS — E039 Hypothyroidism, unspecified: Secondary | ICD-10-CM | POA: Diagnosis not present

## 2015-07-17 DIAGNOSIS — K610 Anal abscess: Secondary | ICD-10-CM | POA: Insufficient documentation

## 2015-07-17 DIAGNOSIS — J45909 Unspecified asthma, uncomplicated: Secondary | ICD-10-CM | POA: Diagnosis not present

## 2015-07-17 LAB — CBC WITH DIFFERENTIAL/PLATELET
BASOS ABS: 0 10*3/uL (ref 0.0–0.1)
BASOS PCT: 0 %
Eosinophils Absolute: 0.1 10*3/uL (ref 0.0–0.7)
Eosinophils Relative: 1 %
HEMATOCRIT: 37.7 % (ref 36.0–46.0)
Hemoglobin: 12.5 g/dL (ref 12.0–15.0)
LYMPHS PCT: 12 %
Lymphs Abs: 1.5 10*3/uL (ref 0.7–4.0)
MCH: 28.5 pg (ref 26.0–34.0)
MCHC: 33.2 g/dL (ref 30.0–36.0)
MCV: 85.9 fL (ref 78.0–100.0)
MONO ABS: 0.7 10*3/uL (ref 0.1–1.0)
Monocytes Relative: 5 %
NEUTROS ABS: 9.8 10*3/uL — AB (ref 1.7–7.7)
Neutrophils Relative %: 82 %
PLATELETS: 253 10*3/uL (ref 150–400)
RBC: 4.39 MIL/uL (ref 3.87–5.11)
RDW: 13 % (ref 11.5–15.5)
WBC: 12.2 10*3/uL — AB (ref 4.0–10.5)

## 2015-07-17 MED ORDER — METRONIDAZOLE 500 MG PO TABS: 500.0000 mg | ORAL_TABLET | Freq: Two times a day (BID) | ORAL | Status: DC

## 2015-07-17 MED ORDER — CIPROFLOXACIN HCL 500 MG PO TABS: 500.0000 mg | ORAL_TABLET | Freq: Two times a day (BID) | ORAL | Status: DC

## 2015-07-17 MED ORDER — POLYETHYLENE GLYCOL 3350 17 G PO PACK: 17.0000 g | PACK | Freq: Every day | ORAL | Status: DC

## 2015-07-17 MED ORDER — FENTANYL CITRATE (PF) 100 MCG/2ML IJ SOLN
50.0000 ug | Freq: Once | INTRAMUSCULAR | Status: AC
  Administered 2015-07-17: 50 ug via INTRAMUSCULAR
  Filled 2015-07-17: qty 2

## 2015-07-17 MED ORDER — LIDOCAINE-EPINEPHRINE (PF) 1 %-1:200000 IJ SOLN
10.0000 mL | Freq: Once | INTRAMUSCULAR | Status: AC
  Administered 2015-07-17: 10 mL
  Filled 2015-07-17: qty 30

## 2015-07-17 NOTE — Discharge Instructions (Signed)
Follow-up with either [facility] urgent clinic or Dr. Marcello Moores.  Perianal Abscess An abscess is an infected area that contains a collection of pus and debris. A perianal abscess is one that occurs in the perineal area, which is the area between the anus and the scrotum in males and between the anus and the vagina in females. Perianal abscesses can vary in size. Without treatment, a perianal abscess can become larger and cause other problems. CAUSES  Glands in the perineal area can become plugged up with debris. When this happens, an abscess may form.  SIGNS AND SYMPTOMS  The most common symptoms of a perianal abscess are:  Swelling and redness in the area of the abscess. The redness may go beyond the abscess and appear as a red streak on the skin.  Pain in the area of the abscess. Other possible symptoms include:   A visible lump or a lump that can be felt when touching the area and is usually painful.  Bleeding or pus-like discharge from the area.  Fever.  General weakness. DIAGNOSIS  Your health care provider will take a medical history and examine the area. This may involve examining the rectal area with a gloved hand (digital rectal exam). For women, it may require a careful vaginal exam. Sometimes, the health care provider needs to look into the rectum using a probe or scope. TREATMENT  Treatment often requires making a cut (incision) in the abscess to drain the pus. This can sometimes be done in your health care provider's office or an emergency department after giving you medicine to numb the area (local anesthetic). For larger or deeper abscesses, surgery may be required to drain the abscess. Antibiotic medicines are sometimes given if there is infection of the surrounding tissue (cellulitis). In some cases, gauze is packed into the abscess to continue draining the area. Frequent sitz baths may be recommended to help the wound heal and to reduce the chance of the abscess  coming back. HOME CARE INSTRUCTIONS   Only take over-the-counter or prescription medicines for pain, fever, or discomfort as directed by your health care provider.  Take antibiotic medicine as directed. Make sure you finish it even if you start to feel better.  If gauze is used in the abscess, follow your health care provider's instructions for removing or changing the gauze. It can usually be removed in 2-3 days.  If one or more drains have been placed in the abscess cavity, be careful not to pull at them. Your health care provider will tell you how long they need to remain in place.  Take warm sitz baths 3-4 times a day and after bowel movements. This will help reduce pain and swelling.  Keep the skin around the abscess clean and dry. Avoid cleaning the area too much.  Avoid scratching the abscess area.  Avoid using colored or perfumed toilet papers. SEEK MEDICAL CARE IF:   You have trouble having a bowel movement or passing urine.  Your pain or swelling in the affected area does not seem to be improving.  The gauze packing or the drains come out before the planned time. SEEK IMMEDIATE MEDICAL CARE IF:   You have problems moving or using your legs.  You have severe or increasing pain.  Your swelling in the affected area suddenly gets worse.  You have a large increase in bleeding or passing of pus.  You have chills or a fever. MAKE SURE YOU:   Understand these instructions.  Will  watch your condition.  Will get help right away if you are not doing well or get worse.   This information is not intended to replace advice given to you by your health care provider. Make sure you discuss any questions you have with your health care provider.   Document Released: 02/17/2006 Document Revised: 11/01/2012 Document Reviewed: 08/23/2012 Elsevier Interactive Patient Education [employer].

## 2015-07-17 NOTE — ED Notes (Signed)
Pt became agitated after being told of wait time.  Pt began voicing her disdane for our hospital.  This RN apologized for her previous experiences and informed her that we would try our best to give her a positive experience.

## 2015-07-17 NOTE — Progress Notes (Signed)
Subjective:  Patient ID: Anna Adams, female    DOB: 07/26/1934  Age: 81 y.o. MRN: [id number]  CC: Rectal Pain  NEW TO ME  HPI Anna Adams presents for several day history of worsening left-sided perianal pain with bleeding and discharge.  Outpatient Prescriptions Prior to Visit  Medication Sig Dispense Refill  . albuterol (PROVENTIL HFA) 108 (90 BASE) MCG/ACT inhaler Inhale 1-2 puffs into the lungs every 6 (six) hours as needed for wheezing or shortness of breath. Must establish with NEW PCP for additional refills. 3 Inhaler 0  . atorvastatin (LIPITOR) 20 MG tablet Take 1 tablet (20 mg total) by mouth daily. 90 tablet 3  . b complex vitamins tablet Take 1 tablet by mouth daily.      . cholecalciferol (VITAMIN D) 1000 UNITS tablet Take 1,000 Units by mouth daily.      Marland Kitchen etodolac (LODINE XL) 400 MG 24 hr tablet Take 1 tablet (400 mg total) by mouth daily. 90 tablet 3  . famotidine (PEPCID) 20 MG tablet Take 1 tablet (20 mg total) by mouth daily. 90 tablet 3  . furosemide (LASIX) 20 MG tablet Take 1 tablet (20 mg total) by mouth daily as needed for fluid or edema. 30 tablet 3  . levothyroxine (SYNTHROID, LEVOTHROID) 150 MCG tablet Take 1 tablet (150 mcg total) by mouth daily before breakfast. 90 tablet 1  . losartan-hydrochlorothiazide (HYZAAR) 100-25 MG tablet Take 1 tablet by mouth daily. 90 tablet 3  . Multiple Vitamin (MULTIVITAMIN) tablet Take 1 tablet by mouth daily.    . pneumococcal 13-valent conjugate vaccine (PREVNAR 13) SUSP injection Inject 0.5 mLs into the muscle tomorrow at 10 am. 0.5 mL 0  . PROVENTIL HFA 108 (90 BASE) MCG/ACT inhaler INHALE 1-2 PUFFS INTO THE LUNGS EVERY 6 (SIX) HOURS AS NEEDED FOR WHEEZING. 6.7 Inhaler 3  . UNABLE TO FIND at bedtime. Osteo-ease    . vitamin E 200 UNIT capsule Take 200 Units by mouth daily.      Marland Kitchen azithromycin (ZITHROMAX) 250 MG tablet Take 2 tabs PO x 1 dose, then 1 tab PO QD x 4 days 6 tablet 0  . benzonatate (TESSALON) 100 MG capsule  Take 1-2 capsules (100-200 mg total) by mouth 3 (three) times daily as needed for cough. 40 capsule 0   No facility-administered medications prior to visit.    ROS Review of Systems  Constitutional: Negative.  Negative for fever, chills, diaphoresis and fatigue.  HENT: Negative.   Eyes: Negative.   Respiratory: Negative.  Negative for cough, choking, chest tightness, shortness of breath and stridor.   Cardiovascular: Negative.  Negative for leg swelling.  Gastrointestinal: Positive for blood in stool, anal bleeding and rectal pain. Negative for nausea, vomiting, abdominal pain and diarrhea.  Endocrine: Negative.   Genitourinary: Negative.   Musculoskeletal: Negative.   Skin: Negative.   Allergic/Immunologic: Negative.   Neurological: Negative.   Hematological: Negative.  Negative for adenopathy. Does not bruise/bleed easily.  Psychiatric/Behavioral: Negative.     Objective:  BP 140/90 mmHg  Pulse 81  Temp(Src) 98.3 F (36.8 C) (Oral)  Ht 4' 10.5" (1.486 m)  Wt 173 lb (78.472 kg)  BMI 35.54 kg/m2  SpO2 96%  BP Readings from Last 3 Encounters:  07/17/15 140/90  06/02/15 108/67  05/02/15 148/70    Wt Readings from Last 3 Encounters:  07/17/15 173 lb (78.472 kg)  05/02/15 173 lb 9.6 oz (78.744 kg)  04/29/15 174 lb (78.926 kg)    Physical Exam  Constitutional: No distress.  HENT:  Mouth/Throat: Oropharynx is clear and moist. No oropharyngeal exudate.  Eyes: Conjunctivae are normal. Right eye exhibits no discharge. Left eye exhibits no discharge. No scleral icterus.  Neck: Normal range of motion. Neck supple. No JVD present. No tracheal deviation present. No thyromegaly present.  Cardiovascular: Normal rate, regular rhythm, normal heart sounds and intact distal pulses.  Exam reveals no gallop and no friction rub.   No murmur heard. Pulmonary/Chest: Effort normal and breath sounds normal. No stridor. No respiratory distress. She has no wheezes. She has no rales. She  exhibits no tenderness.  Abdominal: Soft. Bowel sounds are normal. She exhibits no distension and no mass. There is no tenderness. There is no rebound and no guarding.  Genitourinary:     Lymphadenopathy:    She has no cervical adenopathy.  Skin: Skin is warm and dry. No rash noted. She is not diaphoretic. No erythema. No pallor.  Vitals reviewed.   Lab Results  Component Value Date   WBC 7.6 04/29/2015   HGB 13.5 04/29/2015   HCT 38.8 04/29/2015   PLT 219.0 09/17/2014   GLUCOSE 90 09/17/2014   CHOL 153 09/17/2014   TRIG 137.0 09/17/2014   HDL 52.80 09/17/2014   LDLCALC 73 09/17/2014   ALT 16 09/17/2014   AST 23 09/17/2014   NA 138 09/17/2014   K 4.6 09/17/2014   CL 100 09/17/2014   CREATININE 0.79 09/17/2014   BUN 19 09/17/2014   CO2 33* 09/17/2014   TSH 0.24* 09/17/2014    Mm Screening Breast Tomo Bilateral  08/08/2014  CLINICAL DATA:  Screening. EXAM: DIGITAL SCREENING BILATERAL MAMMOGRAM WITH 3D TOMO WITH CAD COMPARISON:  Previous exam(s). ACR Breast Density Category c: The breast tissue is heterogeneously dense, which may obscure small masses. FINDINGS: There are no findings suspicious for malignancy. Images were processed with CAD. IMPRESSION: No mammographic evidence of malignancy. A result letter of this screening mammogram will be mailed directly to the patient. RECOMMENDATION: Screening mammogram in one year. (Code:SM-B-01Y) BI-RADS CATEGORY  1: Negative. Electronically Signed   By: Everlean Alstrom M.D.   On: 08/08/2014 15:49    Assessment & Plan:   Anna Adams was seen today for rectal pain.  Diagnoses and all orders for this visit:  Abscess, perianal- I called [name] and they are not able to see her for at least the next 3-5 days. She may need general her regional anesthesia to approach the abscess and she is in so much discomfort. I think this needs to be addressed by general surgery as soon as possible. I will send [facility] the [facility]  emergency room where she can be seen by general surgery promptly. -     Ambulatory referral to General Surgery   I have discontinued Anna Adams's azithromycin and benzonatate. I am also having her maintain her vitamin E, b complex vitamins, cholecalciferol, multivitamin, furosemide, levothyroxine, pneumococcal 13-valent conjugate vaccine, albuterol, PROVENTIL HFA, UNABLE TO FIND, atorvastatin, etodolac, famotidine, and losartan-hydrochlorothiazide.  No orders of the defined types were placed in this encounter.     Follow-up: Return in about 1 day (around 07/18/2015).  Scarlette Calico, MD

## 2015-07-17 NOTE — ED Notes (Signed)
PT DISCHARGED. INSTRUCTIONS AND PRESCRIPTIONS GIVEN. AAOX4. PT IN NO APPARENT DISTRESS. THE OPPORTUNITY TO ASK QUESTIONS WAS PROVIDED. 

## 2015-07-17 NOTE — ED Notes (Signed)
Pt being sent by PCP d/t perineal abscess that started draining over the weekend.  PCP reports that this is a recurrent problem.

## 2015-07-17 NOTE — ED Provider Notes (Signed)
CSN: [id number]     Arrival date & time 07/17/15  1507 History   First MD Initiated Contact with Patient 07/17/15 1846     Chief Complaint  Patient presents with  . Perianal Abscess       The history is provided by the patient.  Patient presents with perianal abscess from PCP. States she's had pain in her rectal area for the last few days. States she started having a little bit of drainage. Seen by primary care doctor and sent to ER due to tenderness with perianal abscess. No fevers. She's had some drainage. She has not had this previously. She is not diabetic. She is a former [employer].  Past Medical History  Diagnosis Date  . Hypertension   . Parotid tumor     L side, s/p resection 1980 and 2007  . History of renal calculi   . Hypothyroidism   . Asthma   . ALLERGIC RHINITIS   . Asthma   . Osteoarthritis   . GERD (gastroesophageal reflux disease)     with HH  . Anemia   . Cataract    Past Surgical History  Procedure Laterality Date  . Knee arthroplasty  04/22/2006    DR. WQuillian Quince CAFFREY  . Tubal ligation    . Lumbar disc surgery    . Tonsillectomy and adenoidectomy    . Cardiovascular stress test  07/12/2005    EF 85%  . Ganglion cyst excision  1954 & 1963    (L) wrist x's 2  . Parotid gland tumor excision  1980, 2007    Left  . Cervical discectomy  1993  . Left knee replacement  2005  . Right knee replacement  2008   Family History  Problem Relation Age of Onset  . Asthma Mother   . Cancer Mother   . Heart disease Mother   . Hypertension Father   . Heart disease Father   . Hyperlipidemia Father   . Stroke Father   . Mental illness Father   . Stroke Maternal Grandmother   . Heart disease Maternal Grandfather   . Hypertension Paternal Grandfather    Social History  Substance Use Topics  . Smoking status: Former Smoker    Quit date: 09/01/1962  . Smokeless tobacco: Never Used  . Alcohol Use: No   OB History    No data available     Review of Systems   Constitutional: Negative for activity change and appetite change.  Eyes: Negative for pain.  Respiratory: Negative for chest tightness and shortness of breath.   Cardiovascular: Negative for chest pain and leg swelling.  Gastrointestinal: Positive for blood in stool and rectal pain. Negative for nausea, vomiting, abdominal pain and diarrhea.  Genitourinary: Negative for flank pain and pelvic pain.  Musculoskeletal: Negative for back pain and neck stiffness.  Skin: Negative for rash.  Neurological: Negative for weakness.  Psychiatric/Behavioral: Negative for behavioral problems.      Allergies  Dyphylline-guaifenesin; Erythromycin; Mevacor; and Other  Home Medications   Prior to Admission medications   Medication Sig Start Date End Date Taking? Authorizing Provider  albuterol (PROVENTIL HFA) 108 (90 BASE) MCG/ACT inhaler Inhale 1-2 puffs into the lungs every 6 (six) hours as needed for wheezing or shortness of breath. Must establish with NEW PCP for additional refills. 01/06/15   Rowe Clack, MD  atorvastatin (LIPITOR) 20 MG tablet Take 1 tablet (20 mg total) by mouth daily. 04/04/15   Binnie Rail, MD  b complex  vitamins tablet Take 1 tablet by mouth daily.      Historical Provider, MD  cholecalciferol (VITAMIN D) 1000 UNITS tablet Take 1,000 Units by mouth daily.      Historical Provider, MD  ciprofloxacin (CIPRO) 500 MG tablet Take 1 tablet (500 mg total) by mouth 2 (two) times daily. 07/17/15   Davonna Belling, MD  etodolac (LODINE XL) 400 MG 24 hr tablet Take 1 tablet (400 mg total) by mouth daily. 04/04/15   Binnie Rail, MD  famotidine (PEPCID) 20 MG tablet Take 1 tablet (20 mg total) by mouth daily. 04/04/15   Binnie Rail, MD  furosemide (LASIX) 20 MG tablet Take 1 tablet (20 mg total) by mouth daily as needed for fluid or edema. 11/26/13   Rowe Clack, MD  levothyroxine (SYNTHROID, LEVOTHROID) 150 MCG tablet Take 1 tablet (150 mcg total) by mouth daily before  breakfast. 07/30/14   Rowe Clack, MD  losartan-hydrochlorothiazide (HYZAAR) 100-25 MG tablet Take 1 tablet by mouth daily. 04/04/15   Binnie Rail, MD  metroNIDAZOLE (FLAGYL) 500 MG tablet Take 1 tablet (500 mg total) by mouth 2 (two) times daily. 07/17/15   Davonna Belling, MD  Multiple Vitamin (MULTIVITAMIN) tablet Take 1 tablet by mouth daily.    Historical Provider, MD  pneumococcal 13-valent conjugate vaccine (PREVNAR 13) SUSP injection Inject 0.5 mLs into the muscle tomorrow at 10 am. 10/25/14   Rowe Clack, MD  polyethylene glycol [facility]) packet Take 17 g by mouth daily. 07/17/15   Davonna Belling, MD  PROVENTIL HFA 108 (90 BASE) MCG/ACT inhaler INHALE 1-2 PUFFS INTO THE LUNGS EVERY 6 (SIX) HOURS AS NEEDED FOR WHEEZING. 01/06/15   Rowe Clack, MD  UNABLE TO FIND at bedtime. Osteo-ease    Historical Provider, MD  vitamin E 200 UNIT capsule Take 200 Units by mouth daily.      Historical Provider, MD   BP 154/94 mmHg  Pulse 85  Temp(Src) 98.7 F (37.1 C) (Oral)  Resp 20  SpO2 97% Physical Exam  Constitutional: She appears well-developed.  HENT:  Head: Atraumatic.  Eyes: EOM are normal.  Neck: Neck supple.  Cardiovascular: Normal rate.   Pulmonary/Chest: Effort normal.  Abdominal: Soft.  Genitourinary:  Left-sided perianal swelling with central ulcer with somewhat thin roof. Some purulent drainage. There is approximately 2 cm of swelling. There is firmness around it. Cellulitis/abscess versus mass.  Neurological: She is alert.  Skin: Skin is warm.    ED Course  .Marland KitchenIncision and Drainage Date/Time: 07/17/2015 8:45 PM Performed by: Davonna Belling Authorized by: Davonna Belling Consent: Verbal consent obtained. Written consent not obtained. Risks and benefits: risks, benefits and alternatives were discussed Consent given by: patient Patient understanding: patient states understanding of the procedure being performed Patient identity confirmed: verbally  with patient Time out: Immediately prior to procedure a "time out" was called to verify the correct patient, procedure, equipment, support staff and site/side marked as required. Type: abscess Body area: anogenital Location details: perianal Anesthesia: local infiltration Local anesthetic: lidocaine 2% with epinephrine Anesthetic total: 3 ml Patient sedated: no Risk factor: underlying major vessel and  underlying major nerve Scalpel size: 11 Incision type: single straight Incision depth: dermal Complexity: simple Drainage amount: scant Wound treatment: wound left open Packing material: none Patient tolerance: Patient tolerated the procedure well with no immediate complications   (including critical care time) Labs Review Labs Reviewed  CBC WITH DIFFERENTIAL/PLATELET - Abnormal; Notable for the following:    WBC 12.2 (*)  Neutro Abs 9.8 (*)    All other components within normal limits    Imaging Review No results found. I have personally reviewed and evaluated these images and lab results as part of my medical decision-making.   EKG Interpretation None      MDM   Final diagnoses:  Abscess, perianal    Patient with likely perianal abscess. Incision/dew roofing done in the ER with minimal drainage. His artery been draining freely. Discussed with Dr. Marcello Moores, and patient be seen in follow-up with general surgery.    Davonna Belling, MD 07/17/15 2108

## 2015-07-17 NOTE — Progress Notes (Signed)
Pre visit review using our clinic review tool, if applicable. No additional management support is needed unless otherwise documented below in the visit note. 

## 2015-07-17 NOTE — ED Notes (Signed)
Pt sent by PCP d/t perianal abscess.  Pain score 5/10.  Pt reports that pain began over the weekend and drainage started today.

## 2015-07-17 NOTE — ED Notes (Signed)
RN currently doing blood draw.

## 2015-07-17 NOTE — Patient Instructions (Signed)

## 2015-08-05 ENCOUNTER — Other Ambulatory Visit: Payer: Self-pay | Admitting: *Deleted

## 2015-08-05 MED ORDER — LEVOTHYROXINE SODIUM 150 MCG PO TABS: 150.0000 ug | ORAL_TABLET | Freq: Every day | ORAL | Status: DC

## 2015-08-05 NOTE — Telephone Encounter (Signed)
Received call pt states she is needing rx sent to [facility] on her Levothyroxine. Faxed script to mail service...Johny Chess

## 2015-08-13 DIAGNOSIS — K610 Anal abscess: Secondary | ICD-10-CM | POA: Diagnosis not present

## 2015-09-02 ENCOUNTER — Ambulatory Visit (INDEPENDENT_AMBULATORY_CARE_PROVIDER_SITE_OTHER): Payer: Medicare Other | Admitting: Podiatry

## 2015-09-02 ENCOUNTER — Encounter: Payer: Self-pay | Admitting: Podiatry

## 2015-09-02 VITALS — BP 139/73 | HR 63

## 2015-09-02 DIAGNOSIS — M79606 Pain in leg, unspecified: Secondary | ICD-10-CM

## 2015-09-02 DIAGNOSIS — M79673 Pain in unspecified foot: Secondary | ICD-10-CM

## 2015-09-02 DIAGNOSIS — B351 Tinea unguium: Secondary | ICD-10-CM

## 2015-09-02 NOTE — Progress Notes (Signed)
Subjective:  81 y.o. year old female patient presents accompanied by her husband complaining of painful nails in closed in shoes and hurts to walk.   Objective: Dermatologic: Thick yellow deformed nails x 10.  Vascular: Pedal pulses DP palpable on right and left is not palpable. PT is palpable on both ankles.  Orthopedic: Severe HAV with bunion. Subluxed first MPJ bilateral.  Contracted lesser digits bilateral.  Neurologic: All epicritic and tactile sensations grossly intact.   Assessment:  Dystrophic mycotic nails x 10.  Severe HAV with bunions, subluxed MPJ, and hammer toes bilateral.  Painful feet.  Treatment: All callus and mycotic nails debrided.  Return in 3 months or as needed.

## 2015-09-02 NOTE — Patient Instructions (Signed)
Seen for hypertrophic nails. All nails debrided. Return in 3 months or as needed.  

## 2015-09-08 ENCOUNTER — Ambulatory Visit: Payer: Medicare Other | Admitting: Podiatry

## 2015-09-21 NOTE — Progress Notes (Signed)
Subjective:    Patient ID: Anna Adams, female    DOB: October 16, 1934, 81 y.o.   MRN: [id number]  HPI Here for medicare wellness exam and for routine follow up of her chronic medical problems.   Hypothyroidism:  She is taking her medication daily as prescribed.  She denies changes in her energy level or unexpected weight gain.  Her dose of medication has been stable recently.    Hypertension: She is taking her medication daily. She is compliant with a low sodium diet.  She denies chest pain, palpitations, and regular headaches. She is exercising regularly.  She does monitor her blood pressure at home.    GERD:  She is taking her medication daily as prescribed.  She denies any GERD symptoms and feels her GERD is well controlled.   Hyperlipidemia: She is taking her medication daily. She is compliant with a low fat/cholesterol diet. She is exercising regularly. She denies myalgias.   Osteoporosis:  She is taking calcium and vitamin D daily.  She exercises regularly.   She had a perianal abscess since she was here last.  It has helped after it I & D.    I have personally reviewed and have noted 1.The patient's medical and social history 2.Their use of alcohol, tobacco or illicit drugs 3.Their current medications and supplements 4.The patient's functional ability including ADL's, fall risks, home safety risks and hearing or visual impairment. 5.Diet and physical activities 6.Evidence for depression or mood disorders 7.Care team reviewed and updated -  Podiatry - Dr Caffie Pinto,    Are there smokers in your home (other than you)? Yes, husband  Risk Factors Exercise: water aerobics 2-3 times a week, walks dogs Dietary issues discussed: avoids fried foods, eats a lot of junk food, does not like a lot of veges.  Cardiac risk factors: advanced age, hypertension, hyperlipidemia, and obesity (BMI >= 30 kg/m2).  Depression  Screen  Have you felt down, depressed or hopeless? No  Have you felt little interest or pleasure in doing things?  No  Activities of Daily Living In your present state of health, do you have any difficulty performing the following activities?:  Driving? No Managing money?  No Feeding yourself? No Getting from bed to chair? No Climbing a flight of stairs? No Preparing food and eating?: No Bathing or showering? No Getting dressed: No Getting to/using the toilet? No Moving around from place to place: No In the past year have you fallen or had a near fall?: No   Are you sexually active?  No  Do you have more than one partner?  N/A  Hearing Difficulties: No Do you often ask people to speak up or repeat themselves? No Do you experience ringing or noises in your ears? No Do you have difficulty understanding soft or whispered voices? No Vision:              Any change in vision:  No              Up to date with eye exam: Up to date  Memory:  Do you feel that you have a problem with memory? No, except normal recall  Do you often misplace items? No  Do you feel safe at home?  Yes  Cognitive Testing  Alert, Orientated? Yes  Normal Appearance? Yes  Recall of three objects?  Yes  Can perform simple calculations? Yes  Displays appropriate judgment? Yes  Can read the correct time from a watch face? Yes  Advanced Directives have been discussed with the patient? Yes   Medications and allergies reviewed with patient and updated if appropriate.  Patient Active Problem List   Diagnosis Date Noted  . Abscess, perianal 07/17/2015  . Pain in lower limb 03/04/2015  . Edema 11/26/2013  . Parotid tumor 03/23/2013  . Dyslipidemia   . Onychomycosis due to dermatophyte 05/05/2012  . Osteoporosis, post-menopausal   . GERD (gastroesophageal reflux disease)   . Benign hypertensive heart disease without heart failure 04/28/2011  . Osteoarthritis 01/08/2011  . Hypothyroidism 02/07/2009  .  Essential hypertension 02/07/2009  . ALLERGIC RHINITIS 02/07/2009  . Intrinsic asthma 02/07/2009    Current Outpatient Prescriptions on File Prior to Visit  Medication Sig Dispense Refill  . albuterol (PROVENTIL HFA) 108 (90 BASE) MCG/ACT inhaler Inhale 1-2 puffs into the lungs every 6 (six) hours as needed for wheezing or shortness of breath. Must establish with NEW PCP for additional refills. 3 Inhaler 0  . atorvastatin (LIPITOR) 20 MG tablet Take 1 tablet (20 mg total) by mouth daily. 90 tablet 3  . b complex vitamins tablet Take 1 tablet by mouth daily.      . cholecalciferol (VITAMIN D) 1000 UNITS tablet Take 1,000 Units by mouth daily.      Marland Kitchen etodolac (LODINE XL) 400 MG 24 hr tablet Take 1 tablet (400 mg total) by mouth daily. 90 tablet 3  . famotidine (PEPCID) 20 MG tablet Take 1 tablet (20 mg total) by mouth daily. 90 tablet 3  . levothyroxine (SYNTHROID, LEVOTHROID) 150 MCG tablet Take 1 tablet (150 mcg total) by mouth daily before breakfast. 90 tablet 1  . losartan-hydrochlorothiazide (HYZAAR) 100-25 MG tablet Take 1 tablet by mouth daily. 90 tablet 3  . Multiple Vitamin (MULTIVITAMIN) tablet Take 1 tablet by mouth daily.    Marland Kitchen PROVENTIL HFA 108 (90 BASE) MCG/ACT inhaler INHALE 1-2 PUFFS INTO THE LUNGS EVERY 6 (SIX) HOURS AS NEEDED FOR WHEEZING. 6.7 Inhaler 3  . UNABLE TO FIND at bedtime. Osteo-ease    . vitamin E 200 UNIT capsule Take 200 Units by mouth daily.       No current facility-administered medications on file prior to visit.     Past Medical History:  Diagnosis Date  . ALLERGIC RHINITIS   . Anemia   . Asthma   . Asthma   . Cataract   . GERD (gastroesophageal reflux disease)    with HH  . History of renal calculi   . Hypertension   . Hypothyroidism   . Osteoarthritis   . Parotid tumor    L side, s/p resection 1980 and 2007    Past Surgical History:  Procedure Laterality Date  . CARDIOVASCULAR STRESS TEST  07/12/2005   EF 85%  . CERVICAL DISCECTOMY  1993   . [place]   (L) wrist x's 2  . KNEE ARTHROPLASTY  04/22/2006   DR. WQuillian Quince CAFFREY  . Left knee replacement  2005  . LUMBAR DISC SURGERY    . PAROTID GLAND TUMOR EXCISION  1980, 2007   Left  . right knee replacement  2008  . TONSILLECTOMY AND ADENOIDECTOMY    . TUBAL LIGATION      Social History   Social History  . Marital status: Married    Spouse name: N/A  . Number of children: N/A  . Years of education: N/A   Social History Main Topics  . Smoking status: Former Smoker    Quit date: 09/01/1962  .  Smokeless tobacco: Never Used  . Alcohol use No  . Drug use: No  . Sexual activity: Not Asked   Other Topics Concern  . None   Social History Narrative   married, lives with spouse, retired Therapist, sports    Family History  Problem Relation Age of Onset  . Asthma Mother   . Cancer Mother   . Heart disease Mother   . Hypertension Father   . Heart disease Father   . Hyperlipidemia Father   . Stroke Father   . Mental illness Father   . Stroke Maternal Grandmother   . Heart disease Maternal Grandfather   . Hypertension Paternal Grandfather     Review of Systems  Constitutional: Negative for chills and fever.  HENT: Negative for hearing loss and tinnitus.   Eyes: Negative for visual disturbance.  Respiratory: Positive for shortness of breath (occaisonal with exertion) and wheezing (occ). Negative for cough.   Cardiovascular: Positive for leg swelling. Negative for chest pain and palpitations.  Gastrointestinal: Positive for anal bleeding (hemorrhoidal). Negative for abdominal pain, blood in stool, constipation, diarrhea and nausea.       Gerd controlled  Genitourinary: Negative for dysuria and hematuria.  Musculoskeletal: Positive for arthralgias (intermittent shoulder pain).  Neurological: Negative for light-headedness and headaches.  Psychiatric/Behavioral: Negative for dysphoric mood. The patient is nervous/anxious (with driving).         Objective:   Vitals:   09/22/15 1331  BP: 120/70  Pulse: 69  Resp: 16  Temp: 97.5 F (36.4 C)   Filed Weights   09/22/15 1331  Weight: 175 lb (79.4 kg)   Body mass index is 35.35 kg/m.   Physical Exam Constitutional: She appears well-developed and well-nourished. No distress.  HENT:  Head: Normocephalic and atraumatic.  Right Ear: External ear normal. Normal ear canal and TM Left Ear: External ear normal.  Normal ear canal and TM Mouth/Throat: Oropharynx is clear and moist.  Eyes: Conjunctivae and EOM are normal.  Neck: Neck supple. No tracheal deviation present. No thyromegaly present.  No carotid bruit  Cardiovascular: Normal rate, regular rhythm and normal heart sounds.   No murmur heard.  trace edema  Pulmonary/Chest: Effort normal and breath sounds normal. No respiratory distress. She has no wheezes. She has no rales.  Abdominal: Soft. Obese, She exhibits no distension. There is no tenderness.  Lymphadenopathy: She has no cervical adenopathy.  Skin: Skin is warm and dry. She is not diaphoretic.  Psychiatric: She has a normal mood and affect. Her behavior is normal.         Assessment & Plan:   Wellness Exam: Immunizations  Up to date except flu vaccine Colonoscopy  -  No longer needed Mammogram  - no longer needed Dexa - she deferred - not interested in treatment of further dexa scans Eye exam  Up to date  Hearing loss  -none Memory concerns/difficulties - none, except recall that is within normal for her age 81 of ADLs  Fully  Stressed the importance of regular exercise Recommended weight loss  Patient received copy of preventative screening tests/immunizations recommended for the next 5-10 years.   See Problem List for Assessment and Plan of chronic medical problems.

## 2015-09-21 NOTE — Patient Instructions (Addendum)
  Ms. Lineback , Thank you for taking time to come for your Medicare Wellness Visit. I appreciate your ongoing commitment to your health goals. Please review the following plan we discussed and let me know if I can assist you in the future.   These are the goals we discussed: Goals    Work on weight loss      This is a list of the screening recommended for you and due dates:  Health Maintenance  Topic Date Due  . Flu Shot  08/26/2015  . Tetanus Vaccine  01/26/2016  . DEXA scan (bone density measurement)  Completed  . Shingles Vaccine  Completed  . Pneumonia vaccines  Completed     Test(s) ordered today. Your results will be released to [facility] (or called to you) after review, usually within 72hours after test completion. If any changes need to be made, you will be notified at that same time.  All other Health Maintenance issues reviewed.   All recommended immunizations and age-appropriate screenings are up-to-date or discussed.  No immunizations administered today.   Medications reviewed and updated.  No changes recommended at this time.   Please followup in one year

## 2015-09-22 ENCOUNTER — Encounter: Payer: Medicare Other | Admitting: Internal Medicine

## 2015-09-22 ENCOUNTER — Encounter: Payer: Self-pay | Admitting: Internal Medicine

## 2015-09-22 ENCOUNTER — Other Ambulatory Visit (INDEPENDENT_AMBULATORY_CARE_PROVIDER_SITE_OTHER): Payer: Medicare Other

## 2015-09-22 ENCOUNTER — Other Ambulatory Visit: Payer: Self-pay | Admitting: Internal Medicine

## 2015-09-22 ENCOUNTER — Ambulatory Visit (INDEPENDENT_AMBULATORY_CARE_PROVIDER_SITE_OTHER): Payer: Medicare Other | Admitting: Internal Medicine

## 2015-09-22 VITALS — BP 120/70 | HR 69 | Temp 97.5°F | Resp 16 | Ht 59.0 in | Wt 175.0 lb

## 2015-09-22 DIAGNOSIS — E038 Other specified hypothyroidism: Secondary | ICD-10-CM | POA: Diagnosis not present

## 2015-09-22 DIAGNOSIS — I10 Essential (primary) hypertension: Secondary | ICD-10-CM

## 2015-09-22 DIAGNOSIS — K219 Gastro-esophageal reflux disease without esophagitis: Secondary | ICD-10-CM

## 2015-09-22 DIAGNOSIS — M810 Age-related osteoporosis without current pathological fracture: Secondary | ICD-10-CM

## 2015-09-22 DIAGNOSIS — Z0000 Encounter for general adult medical examination without abnormal findings: Secondary | ICD-10-CM | POA: Diagnosis not present

## 2015-09-22 DIAGNOSIS — K449 Diaphragmatic hernia without obstruction or gangrene: Secondary | ICD-10-CM | POA: Insufficient documentation

## 2015-09-22 DIAGNOSIS — E785 Hyperlipidemia, unspecified: Secondary | ICD-10-CM

## 2015-09-22 LAB — CBC WITH DIFFERENTIAL/PLATELET
Basophils Absolute: 0 10*3/uL (ref 0.0–0.1)
Basophils Relative: 0.3 % (ref 0.0–3.0)
EOS ABS: 0.2 10*3/uL (ref 0.0–0.7)
Eosinophils Relative: 3.1 % (ref 0.0–5.0)
HCT: 39.5 % (ref 36.0–46.0)
HEMOGLOBIN: 13.4 g/dL (ref 12.0–15.0)
LYMPHS ABS: 1.6 10*3/uL (ref 0.7–4.0)
Lymphocytes Relative: 20.5 % (ref 12.0–46.0)
MCHC: 34 g/dL (ref 30.0–36.0)
MCV: 83.7 fl (ref 78.0–100.0)
MONO ABS: 0.6 10*3/uL (ref 0.1–1.0)
Monocytes Relative: 7.3 % (ref 3.0–12.0)
NEUTROS PCT: 68.8 % (ref 43.0–77.0)
Neutro Abs: 5.5 10*3/uL (ref 1.4–7.7)
Platelets: 274 10*3/uL (ref 150.0–400.0)
RBC: 4.72 Mil/uL (ref 3.87–5.11)
RDW: 13.7 % (ref 11.5–15.5)
WBC: 8 10*3/uL (ref 4.0–10.5)

## 2015-09-22 LAB — COMPREHENSIVE METABOLIC PANEL
ALBUMIN: 4.2 g/dL (ref 3.5–5.2)
ALT: 16 U/L (ref 0–35)
AST: 24 U/L (ref 0–37)
Alkaline Phosphatase: 107 U/L (ref 39–117)
BUN: 18 mg/dL (ref 6–23)
CHLORIDE: 98 meq/L (ref 96–112)
CO2: 34 mEq/L — ABNORMAL HIGH (ref 19–32)
CREATININE: 0.7 mg/dL (ref 0.40–1.20)
Calcium: 10.5 mg/dL (ref 8.4–10.5)
GFR: 85.66 mL/min (ref >60.00)
GLUCOSE: 91 mg/dL (ref 70–99)
Potassium: 4.3 mEq/L (ref 3.5–5.1)
SODIUM: 136 meq/L (ref 135–145)
TOTAL PROTEIN: 7.1 g/dL (ref 6.0–8.3)
Total Bilirubin: 0.6 mg/dL (ref 0.2–1.2)

## 2015-09-22 LAB — LIPID PANEL
CHOLESTEROL: 151 mg/dL (ref 0–200)
HDL: 59 mg/dL (ref >39.00)
LDL CALC: 67 mg/dL (ref 0–99)
NONHDL: 92.16
Total CHOL/HDL Ratio: 3
Triglycerides: 126 mg/dL (ref 0.0–149.0)
VLDL: 25.2 mg/dL (ref 0.0–40.0)

## 2015-09-22 LAB — TSH: TSH: 0.09 u[IU]/mL — AB (ref 0.35–4.50)

## 2015-09-22 NOTE — Assessment & Plan Note (Signed)
GERD controlled Continue daily medication  

## 2015-09-22 NOTE — Assessment & Plan Note (Signed)
BP well controlled Current regimen effective and well tolerated Continue current medications at current doses  

## 2015-09-22 NOTE — Assessment & Plan Note (Signed)
Declined dexa - not interested in any treatment or further dexa scans Continue calcium and vitamin d daily Regular exercise

## 2015-09-22 NOTE — Assessment & Plan Note (Signed)
Check lipid panel.  Continue statin. 

## 2015-09-22 NOTE — Assessment & Plan Note (Signed)
Check tsh  Titrate med dose if needed  

## 2015-09-22 NOTE — Progress Notes (Signed)
Pre visit review using our clinic review tool, if applicable. No additional management support is needed unless otherwise documented below in the visit note. 

## 2015-09-24 ENCOUNTER — Other Ambulatory Visit: Payer: Self-pay | Admitting: Emergency Medicine

## 2015-09-24 MED ORDER — LEVOTHYROXINE SODIUM 125 MCG PO TABS: 125.0000 ug | ORAL_TABLET | Freq: Every day | ORAL | 0 refills | Status: DC

## 2015-09-24 NOTE — Telephone Encounter (Signed)
RX faxed to mail order pharm.  

## 2015-11-04 DIAGNOSIS — Z23 Encounter for immunization: Secondary | ICD-10-CM | POA: Diagnosis not present

## 2015-11-10 ENCOUNTER — Other Ambulatory Visit: Payer: Self-pay | Admitting: Internal Medicine

## 2015-11-10 DIAGNOSIS — Z1231 Encounter for screening mammogram for malignant neoplasm of breast: Secondary | ICD-10-CM

## 2015-11-26 ENCOUNTER — Other Ambulatory Visit: Payer: Self-pay | Admitting: Emergency Medicine

## 2015-11-26 ENCOUNTER — Other Ambulatory Visit (INDEPENDENT_AMBULATORY_CARE_PROVIDER_SITE_OTHER): Payer: Medicare Other

## 2015-11-26 ENCOUNTER — Encounter: Payer: Self-pay | Admitting: Internal Medicine

## 2015-11-26 DIAGNOSIS — E0590 Thyrotoxicosis, unspecified without thyrotoxic crisis or storm: Secondary | ICD-10-CM | POA: Diagnosis not present

## 2015-11-26 LAB — TSH: TSH: 0.34 u[IU]/mL — ABNORMAL LOW (ref 0.35–4.50)

## 2015-12-01 ENCOUNTER — Ambulatory Visit
Admission: RE | Admit: 2015-12-01 | Discharge: 2015-12-01 | Disposition: A | Discharge status: Home / Self Care | Payer: Medicare Other | Source: Ambulatory Visit | Attending: Internal Medicine | Admitting: Internal Medicine

## 2015-12-01 DIAGNOSIS — Z1231 Encounter for screening mammogram for malignant neoplasm of breast: Secondary | ICD-10-CM | POA: Diagnosis not present

## 2015-12-03 ENCOUNTER — Ambulatory Visit (INDEPENDENT_AMBULATORY_CARE_PROVIDER_SITE_OTHER): Payer: Medicare Other | Admitting: Podiatry

## 2015-12-03 ENCOUNTER — Encounter: Payer: Self-pay | Admitting: Podiatry

## 2015-12-03 VITALS — BP 124/70 | HR 60

## 2015-12-03 DIAGNOSIS — B351 Tinea unguium: Secondary | ICD-10-CM

## 2015-12-03 DIAGNOSIS — M79606 Pain in leg, unspecified: Secondary | ICD-10-CM

## 2015-12-03 NOTE — Progress Notes (Signed)
Subjective: 81 y.o. year old female patient presents accompanied by her husband complaining of painful nails in closed in shoes and hurts to walk.   Objective: Dermatologic:Thick yellow deformed nails x 10.  Vascular:Pedal pulses DP palpable on right and left is not palpable. PT is palpable on both ankles.  Orthopedic:Severe HAV with bunion. Subluxed first MPJ bilateral.  Contracted lesser digits bilateral.  Neurologic:All epicritic and tactile sensations grossly intact.   Assessment: Dystrophic mycotic nails x 10.  Severe HAV with bunions, subluxed MPJ, and hammer toes bilateral.  Painful feet.  Treatment: All callus and mycotic nails debrided.  Return in 3 months or as needed.

## 2015-12-03 NOTE — Patient Instructions (Signed)
Seen for hypertrophic nails. All nails debrided. Return in 3 months or as needed.  

## 2015-12-05 ENCOUNTER — Other Ambulatory Visit: Payer: Self-pay | Admitting: Internal Medicine

## 2015-12-05 NOTE — Telephone Encounter (Signed)
Please advise as pts last TSH was still overactive.

## 2016-01-30 DIAGNOSIS — H524 Presbyopia: Secondary | ICD-10-CM | POA: Diagnosis not present

## 2016-01-30 DIAGNOSIS — D3131 Benign neoplasm of right choroid: Secondary | ICD-10-CM | POA: Diagnosis not present

## 2016-01-30 DIAGNOSIS — H5203 Hypermetropia, bilateral: Secondary | ICD-10-CM | POA: Diagnosis not present

## 2016-01-30 DIAGNOSIS — H25813 Combined forms of age-related cataract, bilateral: Secondary | ICD-10-CM | POA: Diagnosis not present

## 2016-02-15 ENCOUNTER — Other Ambulatory Visit: Payer: Self-pay | Admitting: Internal Medicine

## 2016-02-16 ENCOUNTER — Encounter: Payer: Self-pay | Admitting: Student

## 2016-03-04 ENCOUNTER — Encounter: Payer: Self-pay | Admitting: Podiatry

## 2016-03-04 ENCOUNTER — Ambulatory Visit (INDEPENDENT_AMBULATORY_CARE_PROVIDER_SITE_OTHER): Payer: Medicare Other | Admitting: Podiatry

## 2016-03-04 DIAGNOSIS — M79606 Pain in leg, unspecified: Secondary | ICD-10-CM

## 2016-03-04 DIAGNOSIS — M79672 Pain in left foot: Secondary | ICD-10-CM

## 2016-03-04 DIAGNOSIS — B351 Tinea unguium: Secondary | ICD-10-CM | POA: Diagnosis not present

## 2016-03-04 DIAGNOSIS — M79671 Pain in right foot: Secondary | ICD-10-CM

## 2016-03-04 NOTE — Patient Instructions (Signed)
Seen for hypertrophic nails. All nails debrided. Return in 3 months or as needed.  

## 2016-03-04 NOTE — Progress Notes (Signed)
Subjective: 82y.o. year old female patient presents accompanied by her husband complaining of painful nails and calluses. They hurts to walk.   Objective: Dermatologic:Thick yellow deformed nails x 10.  Thick plantar callus symptomatic under the first MPJ left foot. No associated edema or erythema noted. Vascular:Pedal pulses DP palpable on right and left is not palpable. PT is palpable on both ankles.  Orthopedic:Severe HAV with bunion. Subluxed first MPJ bilateral.  Contracted lesser digits bilateral.  Neurologic:All epicritic and tactile sensations grossly intact.   Assessment: Dystrophic mycotic nails x 10.  Plantar keratosis sub 1st MPJ left foot. Severe HAV with bunions, subluxed MPJ, and hammer toes bilateral.  Painful feet.  Treatment: All callus and mycotic nails debrided.  Return in 3 months or as needed. 

## 2016-03-10 DIAGNOSIS — L821 Other seborrheic keratosis: Secondary | ICD-10-CM | POA: Diagnosis not present

## 2016-03-10 DIAGNOSIS — D1801 Hemangioma of skin and subcutaneous tissue: Secondary | ICD-10-CM | POA: Diagnosis not present

## 2016-03-10 DIAGNOSIS — C44729 Squamous cell carcinoma of skin of left lower limb, including hip: Secondary | ICD-10-CM | POA: Diagnosis not present

## 2016-03-10 DIAGNOSIS — D485 Neoplasm of uncertain behavior of skin: Secondary | ICD-10-CM | POA: Diagnosis not present

## 2016-03-10 DIAGNOSIS — L814 Other melanin hyperpigmentation: Secondary | ICD-10-CM | POA: Diagnosis not present

## 2016-04-01 ENCOUNTER — Encounter: Payer: Self-pay | Admitting: Internal Medicine

## 2016-04-01 DIAGNOSIS — C4492 Squamous cell carcinoma of skin, unspecified: Secondary | ICD-10-CM | POA: Insufficient documentation

## 2016-04-09 ENCOUNTER — Other Ambulatory Visit: Payer: Self-pay | Admitting: Internal Medicine

## 2016-04-22 ENCOUNTER — Encounter: Payer: Self-pay | Admitting: Internal Medicine

## 2016-04-22 DIAGNOSIS — L900 Lichen sclerosus et atrophicus: Secondary | ICD-10-CM | POA: Diagnosis not present

## 2016-04-22 DIAGNOSIS — C44729 Squamous cell carcinoma of skin of left lower limb, including hip: Secondary | ICD-10-CM | POA: Diagnosis not present

## 2016-04-26 ENCOUNTER — Other Ambulatory Visit: Payer: Self-pay | Admitting: Emergency Medicine

## 2016-04-26 MED ORDER — FAMOTIDINE 20 MG PO TABS: 20.0000 mg | ORAL_TABLET | Freq: Every day | ORAL | 1 refills | Status: DC

## 2016-06-02 ENCOUNTER — Ambulatory Visit: Payer: Medicare Other | Admitting: Podiatry

## 2016-06-09 ENCOUNTER — Encounter: Payer: Self-pay | Admitting: Podiatry

## 2016-06-09 ENCOUNTER — Ambulatory Visit (INDEPENDENT_AMBULATORY_CARE_PROVIDER_SITE_OTHER): Payer: Medicare Other | Admitting: Podiatry

## 2016-06-09 DIAGNOSIS — B351 Tinea unguium: Secondary | ICD-10-CM | POA: Diagnosis not present

## 2016-06-09 DIAGNOSIS — M79671 Pain in right foot: Secondary | ICD-10-CM | POA: Diagnosis not present

## 2016-06-09 DIAGNOSIS — M79672 Pain in left foot: Secondary | ICD-10-CM

## 2016-06-09 DIAGNOSIS — M79606 Pain in leg, unspecified: Secondary | ICD-10-CM

## 2016-06-09 NOTE — Patient Instructions (Signed)
Seen for hypertrophic nails. All nails debrided. Return in 3 months or as needed.  

## 2016-06-09 NOTE — Progress Notes (Signed)
Subjective: 82y.o. year old female patient presents accompanied by her husband complaining of painful nails and calluses. They hurts to walk.   Objective: Dermatologic:Thick yellow deformed nails x 10.  Thick plantar callus symptomatic under the first MPJ left foot. No associated edema or erythema noted. Vascular:Pedal pulses DP palpable on right and left is not palpable. PT is palpable on both ankles.  Orthopedic:Severe HAV with bunion. Subluxed first MPJ bilateral.  Contracted lesser digits bilateral.  Neurologic:All epicritic and tactile sensations grossly intact.   Assessment: Dystrophic mycotic nails x 10.  Plantar keratosis sub 1st MPJ left foot. Severe HAV with bunions, subluxed MPJ, and hammer toes bilateral.  Painful feet.  Treatment: All callus and mycotic nails debrided.  Return in 3 months or as needed. 

## 2016-07-29 DIAGNOSIS — M542 Cervicalgia: Secondary | ICD-10-CM | POA: Diagnosis not present

## 2016-07-29 DIAGNOSIS — M25511 Pain in right shoulder: Secondary | ICD-10-CM | POA: Diagnosis not present

## 2016-07-29 DIAGNOSIS — M25512 Pain in left shoulder: Secondary | ICD-10-CM | POA: Diagnosis not present

## 2016-08-12 ENCOUNTER — Other Ambulatory Visit: Payer: Self-pay | Admitting: Internal Medicine

## 2016-09-09 ENCOUNTER — Ambulatory Visit: Payer: Medicare Other | Admitting: Podiatry

## 2016-09-11 ENCOUNTER — Ambulatory Visit (HOSPITAL_COMMUNITY)
Admission: EM | Admit: 2016-09-11 | Discharge: 2016-09-11 | Disposition: A | Discharge status: Home / Self Care | Payer: Medicare Other | Attending: Emergency Medicine | Admitting: Emergency Medicine

## 2016-09-11 ENCOUNTER — Encounter (HOSPITAL_COMMUNITY): Payer: Self-pay | Admitting: *Deleted

## 2016-09-11 DIAGNOSIS — K644 Residual hemorrhoidal skin tags: Secondary | ICD-10-CM | POA: Diagnosis not present

## 2016-09-11 MED ORDER — HYDROCORTISONE ACE-PRAMOXINE 2.5-1 % RE CREA: 1.0000 "application " | TOPICAL_CREAM | Freq: Three times a day (TID) | RECTAL | 0 refills | Status: DC

## 2016-09-11 MED ORDER — HYDROCORTISONE 2.5 % RE CREA: TOPICAL_CREAM | RECTAL | 0 refills | Status: DC

## 2016-09-11 MED ORDER — HYDROCORTISONE ACETATE 25 MG RE SUPP: 25.0000 mg | Freq: Two times a day (BID) | RECTAL | 0 refills | Status: DC

## 2016-09-11 NOTE — ED Triage Notes (Signed)
Patient reports she noticed blood to rectum this week, states history of hemorrhoids. States discomfort to area.

## 2016-09-11 NOTE — Discharge Instructions (Signed)
Warm sits baths and/or warm water compresses. Use the cream prescribed and as directed. Colace or DSS 3 times a day as a stool softener. You may locate the least expensive. Use these for the next several days until you have healing of the hemorrhoids and this will help lessen the chance of straining or hard stools. He may need to follow-up with primary care provider especially if your not getting better or continued Bleeding. You may need referral to a surgeon or gastroenterologist.

## 2016-09-11 NOTE — ED Provider Notes (Signed)
[facility]    CSN: [id number] Arrival date & time: 09/11/16  1205     History   Chief Complaint Chief Complaint  Patient presents with  . Hemorrhoids    HPI Anna Adams is a 82 y.o. female.   Well-preserved 82 year old female complaining of a dark bump in the perianal area. She had some bleeding early this morning and she questions whether she may have had a hemorrhoid. She states the area is tender but otherwise not particular painful. She has a history of hemorrhoids. She also has a history of. Anal abscess for which she had to have I&D in the emergency department several months ago.      Past Medical History:  Diagnosis Date  . ALLERGIC RHINITIS   . Anemia   . Asthma   . Asthma   . Cataract   . GERD (gastroesophageal reflux disease)    with HH  . History of renal calculi   . Hypertension   . Hypothyroidism   . Osteoarthritis   . Parotid tumor    L side, s/p resection 1980 and 2007    Patient Active Problem List   Diagnosis Date Noted  . Squamous cell skin cancer 04/01/2016  . Hiatal hernia, large 09/22/2015  . Abscess, perianal 07/17/2015  . Pain in lower limb 03/04/2015  . Edema 11/26/2013  . Parotid tumor 03/23/2013  . Dyslipidemia   . Onychomycosis due to dermatophyte 05/05/2012  . Osteoporosis, post-menopausal   . GERD (gastroesophageal reflux disease)   . Benign hypertensive heart disease without heart failure 04/28/2011  . Osteoarthritis 01/08/2011  . Hypothyroidism 02/07/2009  . Essential hypertension 02/07/2009  . ALLERGIC RHINITIS 02/07/2009  . Intrinsic asthma 02/07/2009    Past Surgical History:  Procedure Laterality Date  . CARDIOVASCULAR STRESS TEST  07/12/2005   EF 85%  . CERVICAL DISCECTOMY  1993  . [place]   (L) wrist x's 2  . KNEE ARTHROPLASTY  04/22/2006   DR. WQuillian Quince CAFFREY  . Left knee replacement  2005  . LUMBAR DISC SURGERY    . PAROTID GLAND TUMOR EXCISION  1980, 2007   Left    . right knee replacement  2008  . TONSILLECTOMY AND ADENOIDECTOMY    . TUBAL LIGATION      OB History    No data available       Home Medications    Prior to Admission medications   Medication Sig Start Date End Date Taking? Authorizing Provider  atorvastatin (LIPITOR) 20 MG tablet TAKE 1 TABLET DAILY 04/09/16  Yes Burns, Claudina Lick, MD  b complex vitamins tablet Take 1 tablet by mouth daily.     Yes [provider]  cholecalciferol (VITAMIN D) 1000 UNITS tablet Take 1,000 Units by mouth daily.     Yes [provider]  etodolac (LODINE XL) 400 MG 24 hr tablet TAKE 1 TABLET DAILY 04/09/16  Yes Burns, Claudina Lick, MD  famotidine (PEPCID) 20 MG tablet Take 1 tablet (20 mg total) by mouth daily. 04/26/16  Yes Binnie Rail, MD  losartan-hydrochlorothiazide (HYZAAR) 100-25 MG tablet TAKE 1 TABLET DAILY 04/09/16  Yes Binnie Rail, MD  Multiple Vitamin (MULTIVITAMIN) tablet Take 1 tablet by mouth daily.   Yes [provider]  PROVENTIL HFA 108 (90 BASE) MCG/ACT inhaler INHALE 1-2 PUFFS INTO THE LUNGS EVERY 6 (SIX) HOURS AS NEEDED FOR WHEEZING. 01/06/15  Yes Rowe Clack, MD  SYNTHROID 125 MCG tablet TAKE  1 TABLET DAILY 08/12/16  Yes Burns, Claudina Lick, MD  vitamin E 200 UNIT capsule Take 200 Units by mouth daily.     Yes [provider]  albuterol (PROVENTIL HFA) 108 (90 BASE) MCG/ACT inhaler Inhale 1-2 puffs into the lungs every 6 (six) hours as needed for wheezing or shortness of breath. Must establish with NEW PCP for additional refills. 01/06/15   Rowe Clack, MD  hydrocortisone (ANUSOL-HC) 2.5 % rectal cream Apply rectally 2 times daily 09/11/16   Janne Napoleon, NP  hydrocortisone (ANUSOL-HC) 25 MG suppository Place 1 suppository (25 mg total) rectally 2 (two) times daily. 09/11/16   Janne Napoleon, NP  hydrocortisone-pramoxine (ANALPRAM HC) 2.5-1 % rectal cream Place 1 application rectally 3 (three) times daily. 09/11/16   Janne Napoleon, NP  UNABLE TO FIND at  bedtime. Osteo-ease    [provider]    Family History Family History  Problem Relation Age of Onset  . Asthma Mother   . Cancer Mother   . Heart disease Mother   . Hypertension Father   . Heart disease Father   . Hyperlipidemia Father   . Stroke Father   . Mental illness Father   . Stroke Maternal Grandmother   . Heart disease Maternal Grandfather   . Hypertension Paternal Grandfather     Social History Social History  Substance Use Topics  . Smoking status: Former Smoker    Quit date: 09/01/1962  . Smokeless tobacco: Never Used  . Alcohol use No     Allergies   Dyphylline-guaifenesin; Erythromycin; Mevacor [lovastatin]; and Other   Review of Systems Review of Systems  Constitutional: Negative.   HENT: Negative.   Gastrointestinal: Positive for blood in stool and rectal pain. Negative for abdominal pain.  Skin: Negative.   Neurological: Negative.   All other systems reviewed and are negative.    Physical Exam Triage Vital Signs ED Triage Vitals [09/11/16 1235]  Enc Vitals Group     BP (!) 151/79     Pulse Rate 78     Resp 16     Temp 97.8 F (36.6 C)     Temp Source Oral     SpO2 100 %     Weight      Height      Head Circumference      Peak Flow      Pain Score 4     Pain Loc      Pain Edu?      Excl. in [place]?    No data found.   Updated Vital Signs BP (!) 151/79 (BP Location: Left Arm)   Pulse 78   Temp 97.8 F (36.6 C) (Oral)   Resp 16   SpO2 100%   Visual Acuity Right Eye Distance:   Left Eye Distance:   Bilateral Distance:    Right Eye Near:   Left Eye Near:    Bilateral Near:     Physical Exam  Constitutional: She is oriented to person, place, and time. She appears well-developed and well-nourished. No distress.  Eyes: EOM are normal.  Neck: Normal range of motion. Neck supple.  Cardiovascular: Normal rate.   Pulmonary/Chest: Effort normal. No respiratory distress.  Genitourinary:  Genitourinary Comments: Latrelle Dodrill, RN present. External perirectal exam reveals a 2 and half centimeter firm elongated nodule with overlying flesh-colored skin. Somewhat tender. At the very end there is a bluish vesicular type of lesion which may be the source of her blood. There are a  couple of other firm elongated nodules exuding from the anus. These are not particularly painful. They may be from old hemorrhoids. No evidence of abscess formation. No cellulitis or erythema. No evidence of purulence.   Musculoskeletal: She exhibits no edema.  Neurological: She is alert and oriented to person, place, and time. She exhibits normal muscle tone.  Skin: Skin is warm and dry.  Psychiatric: She has a normal mood and affect.  Nursing note and vitals reviewed.    UC Treatments / Results  Labs (all labs ordered are listed, but only abnormal results are displayed) Labs Reviewed - No data to display  EKG  EKG Interpretation None       Radiology No results found.  Procedures Procedures (including critical care time)  Medications Ordered in UC Medications - No data to display   Initial Impression / Assessment and Plan / UC Course  I have reviewed the triage vital signs and the nursing notes.  Pertinent labs & imaging results that were available during my care of the patient were reviewed by me and considered in my medical decision making (see chart for details).     Warm sits baths and/or warm water compresses. Use the cream prescribed and as directed. Colace or DSS 3 times a day as a stool softener. You may locate the least expensive. Use these for the next several days until you have healing of the hemorrhoids and this will help lessen the chance of straining or hard stools. He may need to follow-up with primary care provider especially if your not getting better or continued Bleeding. You may need referral to a surgeon or gastroenterologist.   Final Clinical Impressions(s) / UC Diagnoses   Final diagnoses:    Bleeding external hemorrhoids    New Prescriptions New Prescriptions   HYDROCORTISONE (ANUSOL-HC) 2.5 % RECTAL CREAM    Apply rectally 2 times daily   HYDROCORTISONE (ANUSOL-HC) 25 MG SUPPOSITORY    Place 1 suppository (25 mg total) rectally 2 (two) times daily.   HYDROCORTISONE-PRAMOXINE (ANALPRAM HC) 2.5-1 % RECTAL CREAM    Place 1 application rectally 3 (three) times daily.  the Supp D/C. The anusol cream is a backup incase  Analpram is too expensive.   Controlled Substance Prescriptions [place] Controlled Substance Registry consulted? Not Applicable   Janne Napoleon, NP 09/11/16 1337

## 2016-09-16 ENCOUNTER — Ambulatory Visit (INDEPENDENT_AMBULATORY_CARE_PROVIDER_SITE_OTHER): Payer: Medicare Other | Admitting: Podiatry

## 2016-09-16 ENCOUNTER — Encounter: Payer: Self-pay | Admitting: Podiatry

## 2016-09-16 DIAGNOSIS — B351 Tinea unguium: Secondary | ICD-10-CM | POA: Diagnosis not present

## 2016-09-16 DIAGNOSIS — M79606 Pain in leg, unspecified: Secondary | ICD-10-CM | POA: Diagnosis not present

## 2016-09-16 NOTE — Progress Notes (Signed)
Subjective: 82y.o. year old female patient presents accompanied by her husband complaining of painful nails and calluses. They hurts to walk.   Objective: Dermatologic:Thick yellow deformed nails x 10.  Thick plantar callus symptomatic under the first MPJ left foot. No associated edema or erythema noted. Vascular:Pedal pulses DP palpable on right and left is not palpable. PT is palpable on both ankles.  Orthopedic:Severe HAV with bunion. Subluxed first MPJ bilateral.  Contracted lesser digits bilateral.  Neurologic:All epicritic and tactile sensations grossly intact.   Assessment: Dystrophic mycotic nails x 10.  Plantar keratosis sub 1st MPJ left foot. Severe HAV with bunions, subluxed MPJ, and hammer toes bilateral.  Painful feet.  Treatment: All callus and mycotic nails debrided.  Return in 3 months or as needed.

## 2016-09-16 NOTE — Patient Instructions (Signed)
Seen for hypertrophic nails. All nails debrided. Return in 3 months or as needed.  

## 2016-09-21 NOTE — Progress Notes (Signed)
Pre visit review using our clinic review tool, if applicable. No additional management support is needed unless otherwise documented below in the visit note. 

## 2016-09-21 NOTE — Progress Notes (Addendum)
Subjective:   Anna Adams is a 82 y.o. female who presents for Medicare Annual (Subsequent) preventive examination.  Review of Systems:  No ROS.  Medicare Wellness Visit. Additional risk factors are reflected in the social history.  Cardiac Risk Factors include: advanced age (>45men, >18 women);dyslipidemia;hypertension;obesity (BMI >30kg/m2)  Sleep patterns: feels rested on waking, gets up 1-2 times nightly to void and sleeps 7-8 hours nightly.    Home Safety/Smoke Alarms: Feels safe in home. Smoke alarms in place.  Living environment; residence and Firearm Safety: 1-story house/ trailer, no firearms. Lives with husband, no needs for DME, good support system Seat Belt Safety/Bike Helmet: Wears seat belt.     Objective:     Vitals: BP 126/74   Pulse 62   Resp 20   Ht 4\' 11"  (1.499 m)   Wt 176 lb (79.8 kg)   SpO2 97%   BMI 35.55 kg/m   Body mass index is 35.55 kg/m.   Tobacco History  Smoking Status  . Former Smoker  . Quit date: 09/01/1962  Smokeless Tobacco  . Never Used     Counseling given: Not Answered   Past Medical History:  Diagnosis Date  . ALLERGIC RHINITIS   . Anemia   . Asthma   . Asthma   . Cataract   . GERD (gastroesophageal reflux disease)    with HH  . History of renal calculi   . Hypertension   . Hypothyroidism   . Osteoarthritis   . Parotid tumor    L side, s/p resection 1980 and 2007   Past Surgical History:  Procedure Laterality Date  . CARDIOVASCULAR STRESS TEST  07/12/2005   EF 85%  . CERVICAL DISCECTOMY  1993  . [place]   (L) wrist x's 2  . KNEE ARTHROPLASTY  04/22/2006   DR. WQuillian Quince CAFFREY  . Left knee replacement  2005  . LUMBAR DISC SURGERY    . PAROTID GLAND TUMOR EXCISION  1980, 2007   Left  . right knee replacement  2008  . TONSILLECTOMY AND ADENOIDECTOMY    . TUBAL LIGATION     Family History  Problem Relation Age of Onset  . Asthma Mother   . Cancer Mother   . Heart disease  Mother   . Hypertension Father   . Heart disease Father   . Hyperlipidemia Father   . Stroke Father   . Mental illness Father   . Stroke Maternal Grandmother   . Heart disease Maternal Grandfather   . Hypertension Paternal Grandfather    History  Sexual Activity  . Sexual activity: Not on file    Outpatient Encounter Prescriptions as of 09/22/2016  Medication Sig  . albuterol (PROVENTIL HFA) 108 (90 BASE) MCG/ACT inhaler Inhale 1-2 puffs into the lungs every 6 (six) hours as needed for wheezing or shortness of breath. Must establish with NEW PCP for additional refills.  Marland Kitchen atorvastatin (LIPITOR) 20 MG tablet TAKE 1 TABLET DAILY  . b complex vitamins tablet Take 1 tablet by mouth daily.    . cholecalciferol (VITAMIN D) 1000 UNITS tablet Take 1,000 Units by mouth daily.    Marland Kitchen etodolac (LODINE XL) 400 MG 24 hr tablet TAKE 1 TABLET DAILY  . famotidine (PEPCID) 20 MG tablet Take 1 tablet (20 mg total) by mouth daily.  . hydrocortisone (ANUSOL-HC) 2.5 % rectal cream Apply rectally 2 times daily  . hydrocortisone (ANUSOL-HC) 25 MG suppository Place 1 suppository (25 mg total) rectally  2 (two) times daily.  . hydrocortisone-pramoxine (ANALPRAM HC) 2.5-1 % rectal cream Place 1 application rectally 3 (three) times daily.  Marland Kitchen losartan-hydrochlorothiazide (HYZAAR) 100-25 MG tablet TAKE 1 TABLET DAILY  . Multiple Vitamin (MULTIVITAMIN) tablet Take 1 tablet by mouth daily.  Marland Kitchen PROVENTIL HFA 108 (90 BASE) MCG/ACT inhaler INHALE 1-2 PUFFS INTO THE LUNGS EVERY 6 (SIX) HOURS AS NEEDED FOR WHEEZING.  . SYNTHROID 125 MCG tablet TAKE 1 TABLET DAILY  . UNABLE TO FIND at bedtime. Osteo-ease  . vitamin E 200 UNIT capsule Take 200 Units by mouth daily.     No facility-administered encounter medications on file as of 09/22/2016.     Activities of Daily Living In your present state of health, do you have any difficulty performing the following activities: 09/22/2016  Hearing? N  Vision? N  Difficulty  concentrating or making decisions? N  Walking or climbing stairs? N  Dressing or bathing? N  Doing errands, shopping? N  Preparing Food and eating ? N  Using the Toilet? N  In the past six months, have you accidently leaked urine? N  Do you have problems with loss of bowel control? N  Managing your Medications? N  Managing your Finances? N  Housekeeping or managing your Housekeeping? N  Some recent data might be hidden    Patient Care Team: Binnie Rail, MD as PCP - General (Internal Medicine) Darlin Coco, MD (Cardiology) Shon Hough, MD (Ophthalmology) Camelia Phenes, DPM (Podiatry) Earlie Server, MD (Orthopedic Surgery) Clance, Armando Reichert, MD (Pulmonary Disease) Laurence Spates, MD (Gastroenterology) Jessy Oto, MD as Consulting Physician (Orthopedic Surgery)    Assessment:    Physical assessment deferred to PCP.  Exercise Activities and Dietary recommendations Current Exercise Habits: Structured exercise class, Time (Minutes): 45, Frequency (Times/Week): 3, Weekly Exercise (Minutes/Week): 135, Intensity: Mild, Exercise limited by: orthopedic condition(s)  Diet (meal preparation, eat out, water intake, caffeinated beverages, dairy products, fruits and vegetables): in general, a "healthy" diet  , well balanced   Reviewed heart healthy, encouraged patient to increase daily water intake.         Goals    . Stay as active as I can for as long as I can      Fall Risk Fall Risk  09/22/2016 09/22/2015 09/17/2014 09/17/2014 09/17/2014  Falls in the past year? No No - Yes Yes  Number falls in past yr: - - 1 - 1  Injury with Fall? - - No - Yes  Risk for fall due to : Impaired mobility - - - -  Follow up - - Education provided;Falls prevention discussed - -   Depression Screen PHQ 2/9 Scores 09/22/2016 09/22/2015 05/02/2015 04/29/2015  PHQ - 2 Score 0 0 0 0  PHQ- 9 Score 0 - - -     Cognitive Function MMSE - Mini Mental State Exam 09/22/2016  Orientation to time  5  Orientation to Place 5  Registration 3  Attention/ Calculation 4  Recall 3  Language- name 2 objects 2  Language- repeat 1  Language- follow 3 step command 3  Language- read & follow direction 1  Write a sentence 1  Copy design 1  Total score 29        Immunization History  Administered Date(s) Administered  . Influenza Whole 11/08/2013  . Influenza,inj,Quad PF,6+ Mos 10/25/2012  . Influenza-Unspecified 10/26/2011, 11/04/2014, 11/04/2015  . Pneumococcal Conjugate-13 11/26/2014  . Pneumococcal Polysaccharide-23 01/25/2005  . Td 01/25/2006  . Zoster 01/25/2009   Screening Tests  Health Maintenance  Topic Date Due  . TETANUS/TDAP  01/26/2016  . INFLUENZA VACCINE  08/25/2016  . DEXA SCAN  Completed  . PNA vac Low Risk Adult  Completed      Plan:    Continue doing brain stimulating activities (puzzles, reading, adult coloring books, staying active) to keep memory sharp.   Continue to eat heart healthy diet (full of fruits, vegetables, whole grains, lean protein, water--limit salt, fat, and sugar intake) and increase physical activity as tolerated.  I have personally reviewed and noted the following in the patient's chart:   . Medical and social history . Use of alcohol, tobacco or illicit drugs  . Current medications and supplements . Functional ability and status . Nutritional status . Physical activity . Advanced directives . List of other physicians . Vitals . Screenings to include cognitive, depression, and falls . Referrals and appointments  In addition, I have reviewed and discussed with patient certain preventive protocols, quality metrics, and best practice recommendations. A written personalized care plan for preventive services as well as general preventive health recommendations were provided to patient.     Michiel Cowboy, RN  09/22/2016   Medical screening examination/treatment/procedure(s) were performed by non-physician practitioner and as  supervising physician I was immediately available for consultation/collaboration. I agree with above. Binnie Rail, MD

## 2016-09-22 ENCOUNTER — Ambulatory Visit (INDEPENDENT_AMBULATORY_CARE_PROVIDER_SITE_OTHER): Payer: Medicare Other | Admitting: *Deleted

## 2016-09-22 VITALS — BP 126/74 | HR 62 | Resp 20 | Ht 59.0 in | Wt 176.0 lb

## 2016-09-22 DIAGNOSIS — Z0000 Encounter for general adult medical examination without abnormal findings: Secondary | ICD-10-CM | POA: Diagnosis not present

## 2016-09-22 NOTE — Patient Instructions (Signed)
Continue doing brain stimulating activities (puzzles, reading, adult coloring books, staying active) to keep memory sharp.   Continue to eat heart healthy diet (full of fruits, vegetables, whole grains, lean protein, water--limit salt, fat, and sugar intake) and increase physical activity as tolerated.   Anna Adams , Thank you for taking time to come for your Medicare Wellness Visit. I appreciate your ongoing commitment to your health goals. Please review the following plan we discussed and let me know if I can assist you in the future.   These are the goals we discussed: Goals    . Stay as active as I can for as long as I can       This is a list of the screening recommended for you and due dates:  Health Maintenance  Topic Date Due  . Tetanus Vaccine  01/26/2016  . Flu Shot  08/25/2016  . DEXA scan (bone density measurement)  Completed  . Pneumonia vaccines  Completed

## 2016-09-29 ENCOUNTER — Ambulatory Visit (INDEPENDENT_AMBULATORY_CARE_PROVIDER_SITE_OTHER): Payer: Medicare Other | Admitting: Internal Medicine

## 2016-09-29 ENCOUNTER — Other Ambulatory Visit (INDEPENDENT_AMBULATORY_CARE_PROVIDER_SITE_OTHER): Payer: Medicare Other

## 2016-09-29 ENCOUNTER — Encounter: Payer: Self-pay | Admitting: Internal Medicine

## 2016-09-29 VITALS — BP 100/64 | HR 75 | Temp 97.8°F | Resp 16 | Wt 174.0 lb

## 2016-09-29 DIAGNOSIS — E785 Hyperlipidemia, unspecified: Secondary | ICD-10-CM

## 2016-09-29 DIAGNOSIS — K219 Gastro-esophageal reflux disease without esophagitis: Secondary | ICD-10-CM

## 2016-09-29 DIAGNOSIS — Z23 Encounter for immunization: Secondary | ICD-10-CM | POA: Diagnosis not present

## 2016-09-29 DIAGNOSIS — R600 Localized edema: Secondary | ICD-10-CM | POA: Diagnosis not present

## 2016-09-29 DIAGNOSIS — E038 Other specified hypothyroidism: Secondary | ICD-10-CM

## 2016-09-29 DIAGNOSIS — I10 Essential (primary) hypertension: Secondary | ICD-10-CM

## 2016-09-29 DIAGNOSIS — M1990 Unspecified osteoarthritis, unspecified site: Secondary | ICD-10-CM | POA: Diagnosis not present

## 2016-09-29 LAB — CBC WITH DIFFERENTIAL/PLATELET
BASOS PCT: 0.6 % (ref 0.0–3.0)
Basophils Absolute: 0 10*3/uL (ref 0.0–0.1)
EOS ABS: 0.2 10*3/uL (ref 0.0–0.7)
EOS PCT: 2.5 % (ref 0.0–5.0)
HCT: 39.9 % (ref 36.0–46.0)
Hemoglobin: 13 g/dL (ref 12.0–15.0)
LYMPHS ABS: 1.5 10*3/uL (ref 0.7–4.0)
Lymphocytes Relative: 19 % (ref 12.0–46.0)
MCHC: 32.6 g/dL (ref 30.0–36.0)
MCV: 87.2 fl (ref 78.0–100.0)
MONO ABS: 0.6 10*3/uL (ref 0.1–1.0)
Monocytes Relative: 8 % (ref 3.0–12.0)
NEUTROS ABS: 5.5 10*3/uL (ref 1.4–7.7)
NEUTROS PCT: 69.9 % (ref 43.0–77.0)
PLATELETS: 257 10*3/uL (ref 150.0–400.0)
RBC: 4.58 Mil/uL (ref 3.87–5.11)
RDW: 13.6 % (ref 11.5–15.5)
WBC: 7.8 10*3/uL (ref 4.0–10.5)

## 2016-09-29 LAB — COMPREHENSIVE METABOLIC PANEL
ALT: 14 U/L (ref 0–35)
AST: 22 U/L (ref 0–37)
Albumin: 4.2 g/dL (ref 3.5–5.2)
Alkaline Phosphatase: 86 U/L (ref 39–117)
BUN: 20 mg/dL (ref 6–23)
CHLORIDE: 96 meq/L (ref 96–112)
CO2: 32 meq/L (ref 19–32)
CREATININE: 0.78 mg/dL (ref 0.40–1.20)
Calcium: 11 mg/dL — ABNORMAL HIGH (ref 8.4–10.5)
GFR: 75.41 mL/min (ref >60.00)
GLUCOSE: 100 mg/dL — AB (ref 70–99)
POTASSIUM: 4.2 meq/L (ref 3.5–5.1)
SODIUM: 135 meq/L (ref 135–145)
Total Bilirubin: 0.6 mg/dL (ref 0.2–1.2)
Total Protein: 6.6 g/dL (ref 6.0–8.3)

## 2016-09-29 LAB — LIPID PANEL
CHOL/HDL RATIO: 3
Cholesterol: 150 mg/dL (ref 0–200)
HDL: 58.3 mg/dL (ref >39.00)
LDL CALC: 77 mg/dL (ref 0–99)
NonHDL: 92.13
Triglycerides: 78 mg/dL (ref 0.0–149.0)
VLDL: 15.6 mg/dL (ref 0.0–40.0)

## 2016-09-29 LAB — TSH: TSH: 0.38 u[IU]/mL (ref 0.35–4.50)

## 2016-09-29 MED ORDER — MECLIZINE HCL 25 MG PO TABS: 25.0000 mg | ORAL_TABLET | Freq: Three times a day (TID) | ORAL | 5 refills | Status: DC | PRN

## 2016-09-29 NOTE — Assessment & Plan Note (Signed)
Check tsh  Titrate med dose if needed  

## 2016-09-29 NOTE — Assessment & Plan Note (Signed)
Check lipid panel  Continue daily statin Regular exercise and healthy diet encouraged  

## 2016-09-29 NOTE — Assessment & Plan Note (Signed)
Taking lodine daily Exercising regularly Taking Tylenol as needed Advised her to try topical arthritis medications

## 2016-09-29 NOTE — Progress Notes (Signed)
Subjective:    Patient ID: Anna Adams, female    DOB: 05/22/1934, 82 y.o.   MRN: [id number]  HPI The patient is here for follow up.  Hypertension: She is taking her medication daily. She is compliant with a low sodium diet.  She denies chest pain, palpitations, shortness of breath and regular headaches. She is exercising regularly - water exercises.     Hyperlipidemia: She is taking her medication daily. She is compliant with a low fat/cholesterol diet. She is exercising regularly.    GERD:  She is taking her medication daily as prescribed.  She denies any GERD symptoms and feels her GERD is well controlled.   Hypothyroidism:  She is taking her medication daily.  She denies any recent changes in energy or weight that are unexplained.   Shoulder pain:  It is bothering her more and she saw orthopedics in July.  She has bad arthritis in both shoulders.   Generalized arthritis:  She takes lodine once daily.  She also takes Tylenol. She is wondering if there is anything else she can do.  Medications and allergies reviewed with patient and updated if appropriate.  Patient Active Problem List   Diagnosis Date Noted  . Squamous cell skin cancer 04/01/2016  . Hiatal hernia, large 09/22/2015  . Abscess, perianal 07/17/2015  . Pain in lower limb 03/04/2015  . Edema 11/26/2013  . Parotid tumor 03/23/2013  . Dyslipidemia   . Onychomycosis due to dermatophyte 05/05/2012  . Osteoporosis, post-menopausal   . GERD (gastroesophageal reflux disease)   . Benign hypertensive heart disease without heart failure 04/28/2011  . Osteoarthritis 01/08/2011  . Hypothyroidism 02/07/2009  . Essential hypertension 02/07/2009  . ALLERGIC RHINITIS 02/07/2009  . Intrinsic asthma 02/07/2009    Current Outpatient Prescriptions on File Prior to Visit  Medication Sig Dispense Refill  . albuterol (PROVENTIL HFA) 108 (90 BASE) MCG/ACT inhaler Inhale 1-2 puffs into the lungs every 6 (six) hours as needed for  wheezing or shortness of breath. Must establish with NEW PCP for additional refills. 3 Inhaler 0  . atorvastatin (LIPITOR) 20 MG tablet TAKE 1 TABLET DAILY 90 tablet 1  . b complex vitamins tablet Take 1 tablet by mouth daily.      . cholecalciferol (VITAMIN D) 1000 UNITS tablet Take 1,000 Units by mouth daily.      Marland Kitchen etodolac (LODINE XL) 400 MG 24 hr tablet TAKE 1 TABLET DAILY 90 tablet 1  . famotidine (PEPCID) 20 MG tablet Take 1 tablet (20 mg total) by mouth daily. 90 tablet 1  . hydrocortisone-pramoxine (ANALPRAM HC) 2.5-1 % rectal cream Place 1 application rectally 3 (three) times daily. 30 g 0  . losartan-hydrochlorothiazide (HYZAAR) 100-25 MG tablet TAKE 1 TABLET DAILY 90 tablet 1  . Multiple Vitamin (MULTIVITAMIN) tablet Take 1 tablet by mouth daily.    Marland Kitchen PROVENTIL HFA 108 (90 BASE) MCG/ACT inhaler INHALE 1-2 PUFFS INTO THE LUNGS EVERY 6 (SIX) HOURS AS NEEDED FOR WHEEZING. 6.7 Inhaler 3  . SYNTHROID 125 MCG tablet TAKE 1 TABLET DAILY 90 tablet 0  . UNABLE TO FIND at bedtime. Osteo-ease    . vitamin E 200 UNIT capsule Take 200 Units by mouth daily.       No current facility-administered medications on file prior to visit.     Past Medical History:  Diagnosis Date  . ALLERGIC RHINITIS   . Anemia   . Asthma   . Asthma   . Cataract   . GERD (gastroesophageal  reflux disease)    with HH  . History of renal calculi   . Hypertension   . Hypothyroidism   . Osteoarthritis   . Parotid tumor    L side, s/p resection 1980 and 2007    Past Surgical History:  Procedure Laterality Date  . CARDIOVASCULAR STRESS TEST  07/12/2005   EF 85%  . CERVICAL DISCECTOMY  1993  . [place]   (L) wrist x's 2  . KNEE ARTHROPLASTY  04/22/2006   DR. WQuillian Quince CAFFREY  . Left knee replacement  2005  . LUMBAR DISC SURGERY    . PAROTID GLAND TUMOR EXCISION  1980, 2007   Left  . right knee replacement  2008  . TONSILLECTOMY AND ADENOIDECTOMY    . TUBAL LIGATION       Social History   Social History  . Marital status: Married    Spouse name: N/A  . Number of children: N/A  . Years of education: N/A   Social History Main Topics  . Smoking status: Former Smoker    Quit date: 09/01/1962  . Smokeless tobacco: Never Used  . Alcohol use No  . Drug use: No  . Sexual activity: Not on file   Other Topics Concern  . Not on file   Social History Narrative   married, lives with spouse, retired Therapist, sports    Family History  Problem Relation Age of Onset  . Asthma Mother   . Cancer Mother   . Heart disease Mother   . Hypertension Father   . Heart disease Father   . Hyperlipidemia Father   . Stroke Father   . Mental illness Father   . Stroke Maternal Grandmother   . Heart disease Maternal Grandfather   . Hypertension Paternal Grandfather     Review of Systems  Constitutional: Negative for chills and fever.  Respiratory: Negative for cough, shortness of breath and wheezing.   Cardiovascular: Positive for leg swelling (no change). Negative for chest pain and palpitations.  Gastrointestinal: Positive for constipation (mild).  Neurological: Negative for light-headedness and headaches.       Objective:   Vitals:   09/29/16 1357  BP: 100/64  Pulse: 75  Resp: 16  Temp: 97.8 F (36.6 C)  SpO2: 93%   Wt Readings from Last 3 Encounters:  09/29/16 174 lb (78.9 kg)  09/22/16 176 lb (79.8 kg)  09/22/15 175 lb (79.4 kg)   Body mass index is 35.14 kg/m.   Physical Exam    Constitutional: Appears well-developed and well-nourished. No distress.  HENT:  Head: Normocephalic and atraumatic.  Neck: Neck supple. No tracheal deviation present. No thyromegaly present.  No cervical lymphadenopathy Cardiovascular: Normal rate, regular rhythm and normal heart sounds.   No murmur heard. No carotid bruit .  Nonpitting, mild bilateral lower extremity edema Pulmonary/Chest: Effort normal and breath sounds normal. No respiratory distress. No has no wheezes.  No rales.  Skin: Skin is warm and dry. Not diaphoretic.  Psychiatric: Normal mood and affect. Behavior is normal.      Assessment & Plan:   Tetanus vaccine today  See Problem List for Assessment and Plan of chronic medical problems.

## 2016-09-29 NOTE — Assessment & Plan Note (Signed)
BP well controlled Current regimen effective and well tolerated Continue current medications at current doses cmp  

## 2016-09-29 NOTE — Patient Instructions (Addendum)
  Test(s) ordered today. Your results will be released to [place] (or called to you) after review, usually within 72hours after test completion. If any changes need to be made, you will be notified at that same time.  All other Health Maintenance issues reviewed.   All recommended immunizations and age-appropriate screenings are up-to-date or discussed.  Tetanus vaccine administered today.   Medications reviewed and updated.   No changes recommended at this time.  Your prescription(s) have been submitted to your pharmacy. Please take as directed and contact our office if you believe you are having problem(s) with the medication(s).   Please followup in 6 months

## 2016-09-29 NOTE — Assessment & Plan Note (Signed)
Chronic, stable.  Continue current medications. 

## 2016-09-29 NOTE — Assessment & Plan Note (Signed)
GERD controlled Continue daily medication  

## 2016-09-30 ENCOUNTER — Encounter: Payer: Self-pay | Admitting: Internal Medicine

## 2016-10-10 ENCOUNTER — Other Ambulatory Visit: Payer: Self-pay | Admitting: Internal Medicine

## 2016-10-25 DIAGNOSIS — Z23 Encounter for immunization: Secondary | ICD-10-CM | POA: Diagnosis not present

## 2016-12-15 ENCOUNTER — Ambulatory Visit: Payer: Medicare Other | Admitting: Podiatry

## 2016-12-20 ENCOUNTER — Encounter: Payer: Self-pay | Admitting: Internal Medicine

## 2016-12-20 ENCOUNTER — Ambulatory Visit (INDEPENDENT_AMBULATORY_CARE_PROVIDER_SITE_OTHER): Payer: Medicare Other | Admitting: Internal Medicine

## 2016-12-20 VITALS — BP 124/76 | HR 75 | Temp 98.9°F | Resp 16 | Wt 170.0 lb

## 2016-12-20 DIAGNOSIS — J209 Acute bronchitis, unspecified: Secondary | ICD-10-CM | POA: Diagnosis not present

## 2016-12-20 MED ORDER — CEFDINIR 300 MG PO CAPS: 300.0000 mg | ORAL_CAPSULE | Freq: Two times a day (BID) | ORAL | 0 refills | Status: DC

## 2016-12-20 NOTE — Patient Instructions (Addendum)
Start the antibiotic today.  Continue the over the counter cold medications.   Your prescription(s) have been submitted to your pharmacy or been printed and provided for you. Please take as directed and contact our office if you believe you are having problem(s) with the medication(s) or have any questions.  If your symptoms worsen or fail to improve, please contact our office for further instruction, or in case of emergency go directly to the emergency room at the closest medical facility.   General Recommendations:    Please drink plenty of fluids.  Get plenty of rest   Sleep in humidified air  Use saline nasal sprays  Netti pot  OTC Medications:  Decongestants - helps relieve congestion   Flonase (generic fluticasone) or Nasacort (generic triamcinolone) - please make sure to use the "cross-over" technique at a 45 degree angle towards the opposite eye as opposed to straight up the nasal passageway.   Sudafed (generic pseudoephedrine - Note this is the one that is available behind the pharmacy counter); Products with phenylephrine (-PE) may also be used but is often not as effective as pseudoephedrine.   If you have HIGH BLOOD PRESSURE - Coricidin HBP; AVOID any product that is -D as this contains pseudoephedrine which may increase your blood pressure.  Afrin (oxymetazoline) every 6-8 hours for up to 3 days.  Allergies - helps relieve runny nose, itchy eyes and sneezing   Claritin (generic loratidine), Allegra (fexofenidine), or Zyrtec (generic cyrterizine) for runny nose. These medications should not cause drowsiness.  Note - Benadryl (generic diphenhydramine) may be used however may cause drowsiness  Cough -   Delsym or Robitussin (generic dextromethorphan)  Expectorants - helps loosen mucus to ease removal   Mucinex (generic guaifenesin) as directed on the package.  Headaches / General Aches   Tylenol (generic acetaminophen) - DO NOT EXCEED 3  grams (3,000 mg) in a 24 hour time period  Advil/Motrin (generic ibuprofen)  Sore Throat -   Salt water gargle   Chloraseptic (generic benzocaine) spray or lozenges / Sucrets (generic dyclonine)

## 2016-12-20 NOTE — Assessment & Plan Note (Signed)
Symptoms consistent with acute bronchitis - likely bacterial Will start omnicef Continue otc cold medications Rest, fluids Call if no improvement

## 2016-12-20 NOTE — Progress Notes (Signed)
Subjective:    Patient ID: TRAVIS PURK, female    DOB: 17-May-1934, 82 y.o.   MRN: [id number]  HPI She is here for an acute visit for cold symptoms.   Her symptoms started one week ago.    She is experiencing a productive cough of yellow sputum, nasal congestion with yellow mucus and headaches.  She denies fever, chills, SOB, wheeze and sinus pain.  Her symptoms have not improved with over the counter medications.   She has tried taking sudafed PE, tessalon perles, mucinex and cough drops.    Medications and allergies reviewed with patient and updated if appropriate.  Patient Active Problem List   Diagnosis Date Noted  . Squamous cell skin cancer 04/01/2016  . Hiatal hernia, large 09/22/2015  . Abscess, perianal 07/17/2015  . Pain in lower limb 03/04/2015  . Edema 11/26/2013  . Parotid tumor 03/23/2013  . Dyslipidemia   . Onychomycosis due to dermatophyte 05/05/2012  . Osteoporosis, post-menopausal   . GERD (gastroesophageal reflux disease)   . Benign hypertensive heart disease without heart failure 04/28/2011  . Osteoarthritis 01/08/2011  . Hypothyroidism 02/07/2009  . Essential hypertension 02/07/2009  . ALLERGIC RHINITIS 02/07/2009  . Intrinsic asthma 02/07/2009    Current Outpatient Medications on File Prior to Visit  Medication Sig Dispense Refill  . albuterol (PROVENTIL HFA) 108 (90 BASE) MCG/ACT inhaler Inhale 1-2 puffs into the lungs every 6 (six) hours as needed for wheezing or shortness of breath. Must establish with NEW PCP for additional refills. 3 Inhaler 0  . atorvastatin (LIPITOR) 20 MG tablet TAKE 1 TABLET DAILY 90 tablet 1  . b complex vitamins tablet Take 1 tablet by mouth daily.      . cholecalciferol (VITAMIN D) 1000 UNITS tablet Take 1,000 Units by mouth daily.      Marland Kitchen etodolac (LODINE XL) 400 MG 24 hr tablet TAKE 1 TABLET DAILY 90 tablet 1  . famotidine (PEPCID) 20 MG tablet TAKE 1 TABLET DAILY 90 tablet 1  . hydrocortisone-pramoxine (ANALPRAM HC)  2.5-1 % rectal cream Place 1 application rectally 3 (three) times daily. 30 g 0  . losartan-hydrochlorothiazide (HYZAAR) 100-25 MG tablet TAKE 1 TABLET DAILY 90 tablet 1  . meclizine (ANTIVERT) 25 MG tablet Take 1 tablet (25 mg total) by mouth 3 (three) times daily as needed for dizziness. 30 tablet 5  . Multiple Vitamin (MULTIVITAMIN) tablet Take 1 tablet by mouth daily.    Marland Kitchen PROVENTIL HFA 108 (90 BASE) MCG/ACT inhaler INHALE 1-2 PUFFS INTO THE LUNGS EVERY 6 (SIX) HOURS AS NEEDED FOR WHEEZING. 6.7 Inhaler 3  . SYNTHROID 125 MCG tablet TAKE 1 TABLET DAILY 90 tablet 1  . UNABLE TO FIND at bedtime. Osteo-ease    . vitamin E 200 UNIT capsule Take 200 Units by mouth daily.       No current facility-administered medications on file prior to visit.     Past Medical History:  Diagnosis Date  . ALLERGIC RHINITIS   . Anemia   . Asthma   . Asthma   . Cataract   . GERD (gastroesophageal reflux disease)    with HH  . History of renal calculi   . Hypertension   . Hypothyroidism   . Osteoarthritis   . Parotid tumor    L side, s/p resection 1980 and 2007    Past Surgical History:  Procedure Laterality Date  . CARDIOVASCULAR STRESS TEST  07/12/2005   EF 85%  . CERVICAL DISCECTOMY  1993  .  [place]   (L) wrist x's 2  . KNEE ARTHROPLASTY  04/22/2006   DR. WQuillian Quince CAFFREY  . Left knee replacement  2005  . LUMBAR DISC SURGERY    . PAROTID GLAND TUMOR EXCISION  1980, 2007   Left  . right knee replacement  2008  . TONSILLECTOMY AND ADENOIDECTOMY    . TUBAL LIGATION      Social History   Socioeconomic History  . Marital status: Married    Spouse name: None  . Number of children: None  . Years of education: None  . Highest education level: None  Social Needs  . Financial resource strain: None  . Food insecurity - worry: None  . Food insecurity - inability: None  . Transportation needs - medical: None  . Transportation needs - non-medical: None    Occupational History  . None  Tobacco Use  . Smoking status: Former Smoker    Last attempt to quit: 09/01/1962    Years since quitting: 54.3  . Smokeless tobacco: Never Used  Substance and Sexual Activity  . Alcohol use: No    Alcohol/week: 0.0 oz  . Drug use: No  . Sexual activity: None  Other Topics Concern  . None  Social History Narrative   married, lives with spouse, retired Therapist, sports    Family History  Problem Relation Age of Onset  . Asthma Mother   . Cancer Mother   . Heart disease Mother   . Hypertension Father   . Heart disease Father   . Hyperlipidemia Father   . Stroke Father   . Mental illness Father   . Stroke Maternal Grandmother   . Heart disease Maternal Grandfather   . Hypertension Paternal Grandfather     Review of Systems  Constitutional: Negative for chills and fever.  HENT: Positive for congestion. Negative for ear pain, sinus pressure, sinus pain and sore throat (mild scratchy throat in the beginning).   Respiratory: Positive for cough (productive of thick, yellow sputum). Negative for shortness of breath and wheezing.   Gastrointestinal: Negative for abdominal pain, diarrhea and nausea.  Neurological: Positive for headaches (mild). Negative for dizziness and light-headedness.       Objective:   Vitals:   12/20/16 1053  BP: 124/76  Pulse: 75  Resp: 16  Temp: 98.9 F (37.2 C)  SpO2: 96%   Filed Weights   12/20/16 1053  Weight: 170 lb (77.1 kg)   Body mass index is 34.34 kg/m.  Wt Readings from Last 3 Encounters:  12/20/16 170 lb (77.1 kg)  09/29/16 174 lb (78.9 kg)  09/22/16 176 lb (79.8 kg)     Physical Exam GENERAL APPEARANCE: Appears stated age, well appearing, NAD EYES: conjunctiva clear, no icterus HEENT: bilateral tympanic membranes and ear canals normal, oropharynx with mild erythema, no thyromegaly, trachea midline, no cervical or supraclavicular lymphadenopathy LUNGS: Clear to auscultation without wheeze or crackles,  unlabored breathing, good air entry bilaterally HEART: Normal T7,D2 with 2/6 systolic murmurs EXTREMITIES: Without clubbing, cyanosis, or edema      Assessment & Plan:   See Problem List for Assessment and Plan of chronic medical problems.

## 2016-12-22 ENCOUNTER — Ambulatory Visit (INDEPENDENT_AMBULATORY_CARE_PROVIDER_SITE_OTHER): Payer: Medicare Other | Admitting: Podiatry

## 2016-12-22 ENCOUNTER — Encounter: Payer: Self-pay | Admitting: Podiatry

## 2016-12-22 DIAGNOSIS — B351 Tinea unguium: Secondary | ICD-10-CM | POA: Diagnosis not present

## 2016-12-22 DIAGNOSIS — M79606 Pain in leg, unspecified: Secondary | ICD-10-CM | POA: Diagnosis not present

## 2016-12-22 NOTE — Patient Instructions (Signed)
Seen for hypertrophic nails. All nails debrided. Return in 3 months or as needed.  

## 2016-12-22 NOTE — Progress Notes (Signed)
Subjective: 82 y.o. year old female patient presents complaining of painful nails. Patient requests toe nails trimmed.   Objective: Dermatologic: Thick yellow deformed nails x 10. Vascular: Dorsalis pedis arteries are palpable on right, not palpable on left. PT is palpable on both feet.  Orthopedic: Contracted lesser digits bilateral. Severe Hallux valgus with bunion deformity bilateral. Subluxed first MPJ bilateral. Neurologic: All epicritic and tactile sensations grossly intact.  Assessment: Dystrophic mycotic nails x 10.  Treatment: All mycotic nails, corns, calluses debrided.  Return in 3 months or as needed.

## 2017-01-31 DIAGNOSIS — H25813 Combined forms of age-related cataract, bilateral: Secondary | ICD-10-CM | POA: Diagnosis not present

## 2017-01-31 DIAGNOSIS — H40013 Open angle with borderline findings, low risk, bilateral: Secondary | ICD-10-CM | POA: Diagnosis not present

## 2017-01-31 DIAGNOSIS — D3131 Benign neoplasm of right choroid: Secondary | ICD-10-CM | POA: Diagnosis not present

## 2017-01-31 DIAGNOSIS — H524 Presbyopia: Secondary | ICD-10-CM | POA: Diagnosis not present

## 2017-03-24 ENCOUNTER — Ambulatory Visit: Payer: Medicare Other | Admitting: Podiatry

## 2017-03-30 ENCOUNTER — Encounter: Payer: Self-pay | Admitting: Podiatry

## 2017-03-30 ENCOUNTER — Ambulatory Visit (INDEPENDENT_AMBULATORY_CARE_PROVIDER_SITE_OTHER): Payer: Medicare Other | Admitting: Podiatry

## 2017-03-30 DIAGNOSIS — M79606 Pain in leg, unspecified: Secondary | ICD-10-CM

## 2017-03-30 DIAGNOSIS — B351 Tinea unguium: Secondary | ICD-10-CM | POA: Diagnosis not present

## 2017-03-30 NOTE — Progress Notes (Signed)
Subjective: 83 y.o. year old female patient presents complaining of painful nails. Patient requests toe nails trimmed.   Objective: Dermatologic: Thick yellow deformed nails x 10. Vascular: Pedal pulses are all palpable. Orthopedic: Severe Hallux valgus with bunion bilateral. Neurologic: All epicritic and tactile sensations grossly intact.  Assessment: Dystrophic mycotic nails x 10. Pain in foot.  Treatment: All mycotic nails debrided.  Return in 3 months or as needed.

## 2017-03-30 NOTE — Patient Instructions (Signed)
Seen for hypertrophic nails. All nails debrided. Return in 3 months or as needed.  

## 2017-04-01 ENCOUNTER — Ambulatory Visit: Payer: Medicare Other | Admitting: Internal Medicine

## 2017-04-04 ENCOUNTER — Other Ambulatory Visit: Payer: Self-pay | Admitting: Internal Medicine

## 2017-04-06 ENCOUNTER — Encounter: Payer: Self-pay | Admitting: Internal Medicine

## 2017-04-06 ENCOUNTER — Ambulatory Visit (INDEPENDENT_AMBULATORY_CARE_PROVIDER_SITE_OTHER): Payer: Medicare Other | Admitting: Internal Medicine

## 2017-04-06 VITALS — BP 128/66 | HR 70 | Temp 98.2°F | Resp 16 | Wt 175.0 lb

## 2017-04-06 DIAGNOSIS — M1990 Unspecified osteoarthritis, unspecified site: Secondary | ICD-10-CM

## 2017-04-06 DIAGNOSIS — E785 Hyperlipidemia, unspecified: Secondary | ICD-10-CM | POA: Diagnosis not present

## 2017-04-06 DIAGNOSIS — K219 Gastro-esophageal reflux disease without esophagitis: Secondary | ICD-10-CM

## 2017-04-06 DIAGNOSIS — E038 Other specified hypothyroidism: Secondary | ICD-10-CM

## 2017-04-06 DIAGNOSIS — I10 Essential (primary) hypertension: Secondary | ICD-10-CM

## 2017-04-06 DIAGNOSIS — J45909 Unspecified asthma, uncomplicated: Secondary | ICD-10-CM | POA: Diagnosis not present

## 2017-04-06 MED ORDER — PROVENTIL HFA 108 (90 BASE) MCG/ACT IN AERS: INHALATION_SPRAY | RESPIRATORY_TRACT | 7 refills | Status: DC

## 2017-04-06 NOTE — Assessment & Plan Note (Signed)
BP well controlled Current regimen effective and well tolerated Continue current medications at current doses  

## 2017-04-06 NOTE — Assessment & Plan Note (Signed)
GERD controlled Continue daily medication  

## 2017-04-06 NOTE — Progress Notes (Signed)
Subjective:    Patient ID: Anna Adams, female    DOB: [birth year], 82 y.o.   MRN: [id number]  HPI The patient is here for follow up.  Hypertension: She is taking her medication daily. She is compliant with a low sodium diet.  She denies chest pain, palpitations, edema, shortness of breath and regular headaches. She is exercising regularly-goes to the gym about once a week.  She does not monitor her blood pressure at home.    Hypothyroidism:  She is taking her medication daily.  She denies any recent changes in energy or weight that are unexplained.   Hyperlipidemia: She is taking her medication daily. She is compliant with a low fat/cholesterol diet. She is exercising minimally. She denies myalgias.   GERD:  She is taking her medication daily as prescribed.  She denies any GERD symptoms and feels her GERD is well controlled.   Arthritis:  She takes lodine daily.  This does help with her arthritis pain, but she has been on it for so long does not eliminate her pain.  She is trying to exercise-water exercises, which does help.  She has fallen twice since she was here.  One time she was transitioning from brick to concrete and one brick was up a little.  She had a hard fall.  She was not sore and had no bruising.  The other time she was going down brick steps and reached down to get the paper and just fell.  She was a little sore.     Medications and allergies reviewed with patient and updated if appropriate.  Patient Active Problem List   Diagnosis Date Noted  . Acute bronchitis 12/20/2016  . Squamous cell skin cancer 04/01/2016  . Hiatal hernia, large 09/22/2015  . Abscess, perianal 07/17/2015  . Pain in lower limb 03/04/2015  . Edema 11/26/2013  . Parotid tumor 03/23/2013  . Dyslipidemia   . Onychomycosis due to dermatophyte 05/05/2012  . Osteoporosis, post-menopausal   . GERD (gastroesophageal reflux disease)   . Benign hypertensive heart disease without heart failure  04/28/2011  . Osteoarthritis 01/08/2011  . Hypothyroidism 02/07/2009  . Essential hypertension 02/07/2009  . ALLERGIC RHINITIS 02/07/2009  . Intrinsic asthma 02/07/2009    Current Outpatient Medications on File Prior to Visit  Medication Sig Dispense Refill  . albuterol (PROVENTIL HFA) 108 (90 BASE) MCG/ACT inhaler Inhale 1-2 puffs into the lungs every 6 (six) hours as needed for wheezing or shortness of breath. Must establish with NEW PCP for additional refills. 3 Inhaler 0  . atorvastatin (LIPITOR) 20 MG tablet TAKE 1 TABLET DAILY 90 tablet 1  . b complex vitamins tablet Take 1 tablet by mouth daily.      . cefdinir (OMNICEF) 300 MG capsule Take 1 capsule (300 mg total) by mouth 2 (two) times daily. 20 capsule 0  . cholecalciferol (VITAMIN D) 1000 UNITS tablet Take 1,000 Units by mouth daily.      Marland Kitchen etodolac (LODINE XL) 400 MG 24 hr tablet TAKE 1 TABLET DAILY 90 tablet 1  . famotidine (PEPCID) 20 MG tablet TAKE 1 TABLET DAILY 90 tablet 1  . hydrocortisone-pramoxine (ANALPRAM HC) 2.5-1 % rectal cream Place 1 application rectally 3 (three) times daily. 30 g 0  . losartan-hydrochlorothiazide (HYZAAR) 100-25 MG tablet TAKE 1 TABLET DAILY 90 tablet 1  . meclizine (ANTIVERT) 25 MG tablet Take 1 tablet (25 mg total) by mouth 3 (three) times daily as needed for dizziness. 30 tablet 5  .  Multiple Vitamin (MULTIVITAMIN) tablet Take 1 tablet by mouth daily.    Marland Kitchen PROVENTIL HFA 108 (90 BASE) MCG/ACT inhaler INHALE 1-2 PUFFS INTO THE LUNGS EVERY 6 (SIX) HOURS AS NEEDED FOR WHEEZING. 6.7 Inhaler 3  . SYNTHROID 125 MCG tablet TAKE 1 TABLET DAILY 90 tablet 1  . UNABLE TO FIND at bedtime. Osteo-ease    . vitamin E 200 UNIT capsule Take 200 Units by mouth daily.       No current facility-administered medications on file prior to visit.     Past Medical History:  Diagnosis Date  . ALLERGIC RHINITIS   . Anemia   . Asthma   . Asthma   . Cataract   . GERD (gastroesophageal reflux disease)    with HH    . History of renal calculi   . Hypertension   . Hypothyroidism   . Osteoarthritis   . Parotid tumor    L side, s/p resection 1980 and 2007    Past Surgical History:  Procedure Laterality Date  . CARDIOVASCULAR STRESS TEST  07/12/2005   EF 85%  . CERVICAL DISCECTOMY  1993  . [place]   (L) wrist x's 2  . KNEE ARTHROPLASTY  04/22/2006   DR. WQuillian Quince [facility]RE[facility]  . Left knee replacement  2005  . LUMBAR DISC SURGERY    . PAROTID GLAND TUMOR EXCISION  1980, 2007   Left  . right knee replacement  2008  . TONSILLECTOMY AND ADENOIDECTOMY    . TUBAL LIGATION      Social History   Socioeconomic History  . Marital status: Married    Spouse name: Not on file  . Number of children: Not on file  . Years of education: Not on file  . Highest education level: Not on file  Social Needs  . Financial resource strain: Not on file  . Food insecurity - worry: Not on file  . Food insecurity - inability: Not on file  . Transportation needs - medical: Not on file  . Transportation needs - non-medical: Not on file  Occupational History  . Not on file  Tobacco Use  . Smoking status: Former Smoker    Last attempt to quit: 09/01/1962    Years since quitting: 54.6  . Smokeless tobacco: Never Used  Substance and Sexual Activity  . Alcohol use: No    Alcohol/week: 0.0 oz  . Drug use: No  . Sexual activity: Not on file  Other Topics Concern  . Not on file  Social History Narrative   married, lives with spouse, retired Therapist, sports    Family History  Problem Relation Age of Onset  . Asthma Mother   . Cancer Mother   . Heart disease Mother   . Hypertension Father   . Heart disease Father   . Hyperlipidemia Father   . Stroke Father   . Mental illness Father   . Stroke Maternal Grandmother   . Heart disease Maternal Grandfather   . Hypertension Paternal Grandfather     Review of Systems Constitutional: Negative for chills, fatigue and fever.  Respiratory: Positive for  shortness of breath (occ) and wheezing (rare). Negative for cough.   Cardiovascular: Positive for leg swelling (chronic, no change). Negative for chest pain and palpitations.  Endocrine: Positive for cold intolerance.  Neurological: Negative for light-headedness and headaches.       Objective:  There were no vitals filed for this visit. BP Readings from Last 3 Encounters:  12/20/16  124/76  09/29/16 100/64  09/22/16 126/74   Wt Readings from Last 3 Encounters:  12/20/16 170 lb (77.1 kg)  09/29/16 174 lb (78.9 kg)  09/22/16 176 lb (79.8 kg)   There is no height or weight on file to calculate BMI.   Physical Exam    Constitutional: Appears well-developed and well-nourished. No distress.  HENT:  Head: Normocephalic and atraumatic.  Neck: Neck supple. No tracheal deviation present. No thyromegaly present.  No cervical lymphadenopathy Cardiovascular: Normal rate, regular rhythm and normal heart sounds.   No murmur heard. No carotid bruit .  Wearing compression socks-edema evident but unable to quantify  Pulmonary/Chest: Effort normal and breath sounds normal. No respiratory distress. No has no wheezes. No rales.  Skin: Skin is warm and dry. Not diaphoretic.  Psychiatric: Normal mood and affect. Behavior is normal.      Assessment & Plan:    See Problem List for Assessment and Plan of chronic medical problems.

## 2017-04-06 NOTE — Assessment & Plan Note (Signed)
Cholesterol has been well controlled Continue current dose of statin

## 2017-04-06 NOTE — Assessment & Plan Note (Signed)
Last tsh in normal range Continue current dose of medication

## 2017-04-06 NOTE — Assessment & Plan Note (Addendum)
Has had increased SOB - inhaler helps Uses inhaler very rarely Does not take allergy medication Continue inhaler as needed

## 2017-04-06 NOTE — Assessment & Plan Note (Signed)
Pain fairly controlled with lodineContinue Will check CMP at next visit

## 2017-04-06 NOTE — Patient Instructions (Addendum)
No immunizations administered today.   Medications reviewed and updated.   No changes recommended at this time.  Your prescription(s) have been submitted to your pharmacy. Please take as directed and contact our office if you believe you are having problem(s) with the medication(s).    Please followup in 6 months

## 2017-04-12 ENCOUNTER — Other Ambulatory Visit: Payer: Self-pay | Admitting: Internal Medicine

## 2017-04-12 DIAGNOSIS — Z139 Encounter for screening, unspecified: Secondary | ICD-10-CM

## 2017-05-02 ENCOUNTER — Ambulatory Visit
Admission: RE | Admit: 2017-05-02 | Discharge: 2017-05-02 | Disposition: A | Discharge status: Home / Self Care | Payer: Medicare Other | Source: Ambulatory Visit | Attending: Internal Medicine | Admitting: Internal Medicine

## 2017-05-02 DIAGNOSIS — Z139 Encounter for screening, unspecified: Secondary | ICD-10-CM

## 2017-05-02 DIAGNOSIS — Z1231 Encounter for screening mammogram for malignant neoplasm of breast: Secondary | ICD-10-CM | POA: Diagnosis not present

## 2017-06-14 ENCOUNTER — Telehealth: Payer: Self-pay | Admitting: Emergency Medicine

## 2017-06-14 NOTE — Telephone Encounter (Signed)
Called patient to schedule AWV. Patient declined at this time. 

## 2017-06-21 DIAGNOSIS — N200 Calculus of kidney: Secondary | ICD-10-CM | POA: Diagnosis not present

## 2017-06-21 DIAGNOSIS — R1084 Generalized abdominal pain: Secondary | ICD-10-CM | POA: Diagnosis not present

## 2017-06-21 DIAGNOSIS — K5900 Constipation, unspecified: Secondary | ICD-10-CM | POA: Diagnosis not present

## 2017-06-30 ENCOUNTER — Encounter: Payer: Self-pay | Admitting: Internal Medicine

## 2017-07-06 ENCOUNTER — Ambulatory Visit (INDEPENDENT_AMBULATORY_CARE_PROVIDER_SITE_OTHER): Payer: Medicare Other | Admitting: Podiatry

## 2017-07-06 ENCOUNTER — Encounter: Payer: Self-pay | Admitting: Podiatry

## 2017-07-06 DIAGNOSIS — L03032 Cellulitis of left toe: Secondary | ICD-10-CM | POA: Diagnosis not present

## 2017-07-06 DIAGNOSIS — M79606 Pain in leg, unspecified: Secondary | ICD-10-CM | POA: Diagnosis not present

## 2017-07-06 DIAGNOSIS — B351 Tinea unguium: Secondary | ICD-10-CM | POA: Diagnosis not present

## 2017-07-06 NOTE — Patient Instructions (Signed)
Seen for hypertrophic nails. All nails debrided. Noted of draining ingrown nail left great toe lateral border. Soak daily till the area becomes dry. Return in 3 months or as needed.

## 2017-07-06 NOTE — Progress Notes (Signed)
Subjective: 83 y.o. year old female patient presents complaining of painful nails. Patient requests toe nails trimmed.   Objective: Dermatologic: Thick yellow deformed nails x 10. Draining ingrown nail left great toe lateral border with minimum erythema. Vascular: Pedal pulses are all palpable. Orthopedic: Severe Hallux valgus with bunion bilateral. Neurologic: All epicritic and tactile sensations grossly intact.  Assessment: Dystrophic mycotic nails x 10. Ingrown left lateral border with drainage.  Treatment: All mycotic nails debrided.  Left great toe cleansed with Iodine and compression bandage placed. Patient is to soak daily till the drainage stops. Return in 3 months or sooner if needed.

## 2017-10-06 ENCOUNTER — Other Ambulatory Visit: Payer: Self-pay

## 2017-10-06 ENCOUNTER — Ambulatory Visit: Payer: Medicare Other | Admitting: Podiatry

## 2017-10-06 MED ORDER — FAMOTIDINE 20 MG PO TABS: 20.0000 mg | ORAL_TABLET | Freq: Every day | ORAL | 1 refills | Status: DC

## 2017-10-07 ENCOUNTER — Other Ambulatory Visit: Payer: Self-pay

## 2017-10-07 ENCOUNTER — Other Ambulatory Visit: Payer: Self-pay | Admitting: Emergency Medicine

## 2017-10-07 ENCOUNTER — Other Ambulatory Visit: Payer: Self-pay | Admitting: Internal Medicine

## 2017-10-07 MED ORDER — ETODOLAC ER 400 MG PO TB24: 400.0000 mg | ORAL_TABLET | Freq: Every day | ORAL | 0 refills | Status: DC

## 2017-10-07 MED ORDER — ATORVASTATIN CALCIUM 20 MG PO TABS: 20.0000 mg | ORAL_TABLET | Freq: Every day | ORAL | 0 refills | Status: DC

## 2017-10-07 MED ORDER — LOSARTAN POTASSIUM-HCTZ 100-25 MG PO TABS: 1.0000 | ORAL_TABLET | Freq: Every day | ORAL | 1 refills | Status: DC

## 2017-10-07 MED ORDER — LEVOTHYROXINE SODIUM 125 MCG PO TABS: 125.0000 ug | ORAL_TABLET | Freq: Every day | ORAL | 0 refills | Status: DC

## 2017-10-11 ENCOUNTER — Ambulatory Visit: Payer: Medicare Other | Admitting: Podiatry

## 2017-10-11 DIAGNOSIS — D225 Melanocytic nevi of trunk: Secondary | ICD-10-CM | POA: Diagnosis not present

## 2017-10-11 DIAGNOSIS — R7303 Prediabetes: Secondary | ICD-10-CM | POA: Insufficient documentation

## 2017-10-11 DIAGNOSIS — D1801 Hemangioma of skin and subcutaneous tissue: Secondary | ICD-10-CM | POA: Diagnosis not present

## 2017-10-11 DIAGNOSIS — L821 Other seborrheic keratosis: Secondary | ICD-10-CM | POA: Diagnosis not present

## 2017-10-11 DIAGNOSIS — L570 Actinic keratosis: Secondary | ICD-10-CM | POA: Diagnosis not present

## 2017-10-11 DIAGNOSIS — Z85828 Personal history of other malignant neoplasm of skin: Secondary | ICD-10-CM | POA: Diagnosis not present

## 2017-10-11 NOTE — Patient Instructions (Addendum)
  Test(s) ordered today. Your results will be released to [facility] (or called to you) after review, usually within 72hours after test completion. If any changes need to be made, you will be notified at that same time.   Flu immunization administered today.    Medications reviewed and updated.  No changes recommended at this time.  Your prescription(s) have been submitted to your pharmacy. Please take as directed and contact our office if you believe you are having problem(s) with the medication(s).   Please followup in 6 months

## 2017-10-11 NOTE — Progress Notes (Signed)
Subjective:    Patient ID: Anna Adams, female    DOB: 07/15/1934, 83 y.o.   MRN: [id number]  HPI The patient is here for follow up.  Hypertension: She is taking her medication daily. She is compliant with a low sodium diet.  She denies chest pain, palpitations and regular headaches. She is not exercising regularly.  She does not monitor her blood pressure at home.    Hypothyroidism:  She is taking her medication daily.  She denies any recent changes in energy or weight that are unexplained.   Hyperglycemia:  She is compliant with a low sugar/carbohydrate diet.  She is exercising minimally.-Her husband has been experiencing medical problems and she has not been able to exercise regularly because she is helping him more.  Hypercalcemia: Over the year she has had some low-grade hypercalcemia.  She is not currently taking any calcium supplements.  She is unsure of the cause of her elevated calcium.  GERD:  She is taking her pepcid daily as prescribed.  She denies any GERD symptoms and feels her GERD is well controlled.   Hyperlipidemia: She is taking her medication daily. She is compliant with a low fat/cholesterol diet. She is not exercising regularly. She denies myalgias.   Osteoarthritis, falls: She is taking etodolac daily.  Her arthritis pain is fairly controlled.  In May she had LLQ pain up into her flank.  She was concerned it was a kidney stone.  She called the on call doctor and was advised to try tylenol, which helped a little.  She saw a urologist the next day.  She had no kidney stones.  She had a ct scan and was very constipated.  She was put on miralax.  She did have diverticulitis and was put on flagyl and cipro.     Medications and allergies reviewed with patient and updated if appropriate.  Patient Active Problem List   Diagnosis Date Noted  . Hyperglycemia 10/11/2017  . Squamous cell skin cancer 04/01/2016  . Hiatal hernia, large 09/22/2015  . Pain in lower limb  03/04/2015  . Edema 11/26/2013  . Parotid tumor 03/23/2013  . Dyslipidemia   . Onychomycosis due to dermatophyte 05/05/2012  . Osteoporosis, post-menopausal   . GERD (gastroesophageal reflux disease)   . Benign hypertensive heart disease without heart failure 04/28/2011  . Osteoarthritis 01/08/2011  . Hypothyroidism 02/07/2009  . Essential hypertension 02/07/2009  . ALLERGIC RHINITIS 02/07/2009  . Intrinsic asthma 02/07/2009    Current Outpatient Medications on File Prior to Visit  Medication Sig Dispense Refill  . atorvastatin (LIPITOR) 20 MG tablet Take 1 tablet (20 mg total) by mouth daily. 90 tablet 0  . b complex vitamins tablet Take 1 tablet by mouth daily.      . cholecalciferol (VITAMIN D) 1000 UNITS tablet Take 1,000 Units by mouth daily.      Marland Kitchen etodolac (LODINE XL) 400 MG 24 hr tablet Take 1 tablet (400 mg total) by mouth daily. 90 tablet 0  . famotidine (PEPCID) 20 MG tablet Take 1 tablet (20 mg total) by mouth daily. 90 tablet 1  . hydrocortisone-pramoxine (ANALPRAM HC) 2.5-1 % rectal cream Place 1 application rectally 3 (three) times daily. 30 g 0  . levothyroxine (SYNTHROID) 125 MCG tablet Take 1 tablet (125 mcg total) by mouth daily. 90 tablet 0  . losartan-hydrochlorothiazide (HYZAAR) 100-25 MG tablet Take 1 tablet by mouth daily. 90 tablet 1  . meclizine (ANTIVERT) 25 MG tablet Take 1 tablet (25 mg  total) by mouth 3 (three) times daily as needed for dizziness. 30 tablet 5  . Multiple Vitamin (MULTIVITAMIN) tablet Take 1 tablet by mouth daily.    Marland Kitchen PROVENTIL HFA 108 (90 Base) MCG/ACT inhaler INHALE 1-2 PUFFS INTO THE LUNGS EVERY 6 (SIX) HOURS AS NEEDED FOR WHEEZING. 6.7 Inhaler 7  . UNABLE TO FIND at bedtime. Osteo-ease    . vitamin E 200 UNIT capsule Take 200 Units by mouth daily.       No current facility-administered medications on file prior to visit.     Past Medical History:  Diagnosis Date  . ALLERGIC RHINITIS   . Anemia   . Asthma   . Asthma   .  Cataract   . GERD (gastroesophageal reflux disease)    with HH  . History of renal calculi   . Hypertension   . Hypothyroidism   . Osteoarthritis   . Parotid tumor    L side, s/p resection 1980 and 2007    Past Surgical History:  Procedure Laterality Date  . CARDIOVASCULAR STRESS TEST  07/12/2005   EF 85%  . CERVICAL DISCECTOMY  1993  . [place]   (L) wrist x's 2  . KNEE ARTHROPLASTY  04/22/2006   DR. WQuillian Quince CAFFREY  . Left knee replacement  2005  . LUMBAR DISC SURGERY    . PAROTID GLAND TUMOR EXCISION  1980, 2007   Left  . right knee replacement  2008  . TONSILLECTOMY AND ADENOIDECTOMY    . TUBAL LIGATION      Social History   Socioeconomic History  . Marital status: Married    Spouse name: Not on file  . Number of children: Not on file  . Years of education: Not on file  . Highest education level: Not on file  Occupational History  . Not on file  Social Needs  . Financial resource strain: Not on file  . Food insecurity:    Worry: Not on file    Inability: Not on file  . Transportation needs:    Medical: Not on file    Non-medical: Not on file  Tobacco Use  . Smoking status: Former Smoker    Last attempt to quit: 09/01/1962    Years since quitting: 55.1  . Smokeless tobacco: Never Used  Substance and Sexual Activity  . Alcohol use: No    Alcohol/week: 0.0 standard drinks  . Drug use: No  . Sexual activity: Not on file  Lifestyle  . Physical activity:    Days per week: Not on file    Minutes per session: Not on file  . Stress: Not on file  Relationships  . Social connections:    Talks on phone: Not on file    Gets together: Not on file    Attends religious service: Not on file    Active member of club or organization: Not on file    Attends meetings of clubs or organizations: Not on file    Relationship status: Not on file  Other Topics Concern  . Not on file  Social History Narrative   married, lives with spouse,  retired Therapist, sports    Family History  Problem Relation Age of Onset  . Asthma Mother   . Cancer Mother   . Heart disease Mother   . Hypertension Father   . Heart disease Father   . Hyperlipidemia Father   . Stroke Father   . Mental illness Father   .  Stroke Maternal Grandmother   . Heart disease Maternal Grandfather   . Hypertension Paternal Grandfather     Review of Systems  Constitutional: Negative for chills, fatigue and fever.  Respiratory: Positive for shortness of breath (with exertion - chronic). Negative for cough and wheezing.   Cardiovascular: Positive for leg swelling (chronic, no change). Negative for chest pain and palpitations.  Gastrointestinal:       Jerrye Bushy controlled  Neurological: Negative for light-headedness and headaches.       Objective:   Vitals:   10/12/17 1327  BP: 134/78  Pulse: 62  Resp: 16  Temp: 97.9 F (36.6 C)  SpO2: 95%   BP Readings from Last 3 Encounters:  10/12/17 134/78  04/06/17 128/66  12/20/16 124/76   Wt Readings from Last 3 Encounters:  10/12/17 169 lb (76.7 kg)  04/06/17 175 lb (79.4 kg)  12/20/16 170 lb (77.1 kg)   Body mass index is 34.13 kg/m.   Physical Exam    Constitutional: Appears well-developed and well-nourished. No distress.  HENT:  Head: Normocephalic and atraumatic.  Neck: Neck supple. No tracheal deviation present. No thyromegaly present.  No cervical lymphadenopathy Cardiovascular: Normal rate, regular rhythm and normal heart sounds.   2/6 systolic murmur heard. No carotid bruit .  Mild b/l LE edema Pulmonary/Chest: Effort normal and breath sounds normal. No respiratory distress. No has no wheezes. No rales.  Skin: Skin is warm and dry. Not diaphoretic.  Psychiatric: Normal mood and affect. Behavior is normal.      Assessment & Plan:    See Problem List for Assessment and Plan of chronic medical problems.

## 2017-10-11 NOTE — Assessment & Plan Note (Addendum)
Taking etodolac daily Overall arthritic pain fairly controlled Continue above Will restart regular exercise once able

## 2017-10-12 ENCOUNTER — Other Ambulatory Visit (INDEPENDENT_AMBULATORY_CARE_PROVIDER_SITE_OTHER): Payer: Medicare Other

## 2017-10-12 ENCOUNTER — Encounter: Payer: Self-pay | Admitting: Internal Medicine

## 2017-10-12 ENCOUNTER — Ambulatory Visit (INDEPENDENT_AMBULATORY_CARE_PROVIDER_SITE_OTHER): Payer: Medicare Other | Admitting: Internal Medicine

## 2017-10-12 VITALS — BP 134/78 | HR 62 | Temp 97.9°F | Resp 16 | Ht 59.0 in | Wt 169.0 lb

## 2017-10-12 DIAGNOSIS — E038 Other specified hypothyroidism: Secondary | ICD-10-CM

## 2017-10-12 DIAGNOSIS — E785 Hyperlipidemia, unspecified: Secondary | ICD-10-CM

## 2017-10-12 DIAGNOSIS — K219 Gastro-esophageal reflux disease without esophagitis: Secondary | ICD-10-CM

## 2017-10-12 DIAGNOSIS — I10 Essential (primary) hypertension: Secondary | ICD-10-CM | POA: Diagnosis not present

## 2017-10-12 DIAGNOSIS — K5792 Diverticulitis of intestine, part unspecified, without perforation or abscess without bleeding: Secondary | ICD-10-CM | POA: Insufficient documentation

## 2017-10-12 DIAGNOSIS — R739 Hyperglycemia, unspecified: Secondary | ICD-10-CM | POA: Diagnosis not present

## 2017-10-12 DIAGNOSIS — M1990 Unspecified osteoarthritis, unspecified site: Secondary | ICD-10-CM

## 2017-10-12 DIAGNOSIS — Z23 Encounter for immunization: Secondary | ICD-10-CM

## 2017-10-12 LAB — CBC WITH DIFFERENTIAL/PLATELET
BASOS ABS: 0.1 10*3/uL (ref 0.0–0.1)
Basophils Relative: 1.1 % (ref 0.0–3.0)
EOS PCT: 2 % (ref 0.0–5.0)
Eosinophils Absolute: 0.1 10*3/uL (ref 0.0–0.7)
HEMATOCRIT: 39.3 % (ref 36.0–46.0)
Hemoglobin: 13.3 g/dL (ref 12.0–15.0)
LYMPHS ABS: 1.4 10*3/uL (ref 0.7–4.0)
LYMPHS PCT: 21.1 % (ref 12.0–46.0)
MCHC: 33.7 g/dL (ref 30.0–36.0)
MCV: 86 fl (ref 78.0–100.0)
Monocytes Absolute: 0.5 10*3/uL (ref 0.1–1.0)
Monocytes Relative: 7.4 % (ref 3.0–12.0)
NEUTROS ABS: 4.4 10*3/uL (ref 1.4–7.7)
NEUTROS PCT: 68.4 % (ref 43.0–77.0)
PLATELETS: 218 10*3/uL (ref 150.0–400.0)
RBC: 4.57 Mil/uL (ref 3.87–5.11)
RDW: 13.4 % (ref 11.5–15.5)
WBC: 6.4 10*3/uL (ref 4.0–10.5)

## 2017-10-12 LAB — COMPREHENSIVE METABOLIC PANEL
ALK PHOS: 79 U/L (ref 39–117)
ALT: 14 U/L (ref 0–35)
AST: 21 U/L (ref 0–37)
Albumin: 4.3 g/dL (ref 3.5–5.2)
BILIRUBIN TOTAL: 0.6 mg/dL (ref 0.2–1.2)
BUN: 19 mg/dL (ref 6–23)
CO2: 35 mEq/L — ABNORMAL HIGH (ref 19–32)
Calcium: 10.8 mg/dL — ABNORMAL HIGH (ref 8.4–10.5)
Chloride: 97 mEq/L (ref 96–112)
Creatinine, Ser: 0.75 mg/dL (ref 0.40–1.20)
GFR: 78.7 mL/min (ref >60.00)
Glucose, Bld: 83 mg/dL (ref 70–99)
POTASSIUM: 4.3 meq/L (ref 3.5–5.1)
Sodium: 136 mEq/L (ref 135–145)
TOTAL PROTEIN: 6.9 g/dL (ref 6.0–8.3)

## 2017-10-12 LAB — LIPID PANEL
CHOLESTEROL: 158 mg/dL (ref 0–200)
HDL: 67.9 mg/dL (ref >39.00)
LDL Cholesterol: 75 mg/dL (ref 0–99)
NonHDL: 90.29
Total CHOL/HDL Ratio: 2
Triglycerides: 77 mg/dL (ref 0.0–149.0)
VLDL: 15.4 mg/dL (ref 0.0–40.0)

## 2017-10-12 LAB — HEMOGLOBIN A1C: HEMOGLOBIN A1C: 5.7 % (ref 4.6–6.5)

## 2017-10-12 LAB — TSH: TSH: 0.26 u[IU]/mL — ABNORMAL LOW (ref 0.35–4.50)

## 2017-10-12 NOTE — Assessment & Plan Note (Signed)
GERD controlled with daily Pepcid Continue daily medication

## 2017-10-12 NOTE — Assessment & Plan Note (Signed)
Check lipid panel, CMP Low-fat/cholesterol diet Encouraged regular exercise when able Continue statin

## 2017-10-12 NOTE — Assessment & Plan Note (Signed)
BP well controlled Current regimen effective and well tolerated Continue current medications at current doses cmp  

## 2017-10-12 NOTE — Assessment & Plan Note (Signed)
Somewhat chronic Not currently taking any calcium supplementation ? secondary to hydrochlorothiazide Check calcium, PTH

## 2017-10-12 NOTE — Assessment & Plan Note (Signed)
Check A1c. 

## 2017-10-12 NOTE — Assessment & Plan Note (Signed)
Clinically euthyroid Check tsh  Titrate med dose if needed  

## 2017-10-13 ENCOUNTER — Encounter: Payer: Self-pay | Admitting: Internal Medicine

## 2017-10-13 LAB — PTH, INTACT AND CALCIUM
Calcium: 10.9 mg/dL — ABNORMAL HIGH (ref 8.6–10.4)
PTH: 88 pg/mL — ABNORMAL HIGH (ref 14–64)

## 2017-10-14 MED ORDER — LEVOTHYROXINE SODIUM 112 MCG PO TABS: 112.0000 ug | ORAL_TABLET | Freq: Every day | ORAL | 0 refills | Status: DC

## 2017-10-15 ENCOUNTER — Other Ambulatory Visit: Payer: Self-pay | Admitting: Internal Medicine

## 2017-10-15 DIAGNOSIS — E039 Hypothyroidism, unspecified: Secondary | ICD-10-CM

## 2017-10-15 MED ORDER — LOSARTAN POTASSIUM 100 MG PO TABS: 100.0000 mg | ORAL_TABLET | Freq: Every day | ORAL | 3 refills | Status: DC

## 2017-10-15 MED ORDER — AMLODIPINE BESYLATE 5 MG PO TABS: 5.0000 mg | ORAL_TABLET | Freq: Every day | ORAL | 3 refills | Status: DC

## 2017-10-18 ENCOUNTER — Ambulatory Visit: Payer: Medicare Other | Admitting: Podiatry

## 2017-11-01 ENCOUNTER — Ambulatory Visit (INDEPENDENT_AMBULATORY_CARE_PROVIDER_SITE_OTHER): Payer: Medicare Other | Admitting: Podiatry

## 2017-11-01 ENCOUNTER — Encounter: Payer: Self-pay | Admitting: Podiatry

## 2017-11-01 DIAGNOSIS — B351 Tinea unguium: Secondary | ICD-10-CM | POA: Diagnosis not present

## 2017-11-01 DIAGNOSIS — M79671 Pain in right foot: Secondary | ICD-10-CM | POA: Diagnosis not present

## 2017-11-01 DIAGNOSIS — M79672 Pain in left foot: Secondary | ICD-10-CM | POA: Diagnosis not present

## 2017-11-01 NOTE — Progress Notes (Signed)
Subjective: 83 y.o. year old female patient presents complaining of painful nails. Patient requests toe nails, corns and calluses trimmed.   Objective: Dermatologic: Thick yellow deformed nails x 10. Vascular: Pedal pulses are all palpable. Orthopedic: Contracted lesser digits with HAV and bunion bilateral. Neurologic: All epicritic and tactile sensations grossly intact.  Assessment: Dystrophic mycotic nails x 10. Painful nails.  Treatment: All mycotic nails debrided.  Return in 3 months or as needed.

## 2017-11-01 NOTE — Patient Instructions (Signed)
Seen for hypertrophic nails. All nails debrided. Return as needed.  

## 2017-11-09 DIAGNOSIS — H04122 Dry eye syndrome of left lacrimal gland: Secondary | ICD-10-CM | POA: Diagnosis not present

## 2017-11-28 ENCOUNTER — Other Ambulatory Visit (INDEPENDENT_AMBULATORY_CARE_PROVIDER_SITE_OTHER): Payer: Medicare Other

## 2017-11-28 DIAGNOSIS — E039 Hypothyroidism, unspecified: Secondary | ICD-10-CM | POA: Diagnosis not present

## 2017-11-28 LAB — COMPREHENSIVE METABOLIC PANEL
ALT: 15 U/L (ref 0–35)
AST: 23 U/L (ref 0–37)
Albumin: 4.4 g/dL (ref 3.5–5.2)
Alkaline Phosphatase: 89 U/L (ref 39–117)
BILIRUBIN TOTAL: 0.5 mg/dL (ref 0.2–1.2)
BUN: 21 mg/dL (ref 6–23)
CHLORIDE: 100 meq/L (ref 96–112)
CO2: 31 meq/L (ref 19–32)
Calcium: 10.4 mg/dL (ref 8.4–10.5)
Creatinine, Ser: 0.75 mg/dL (ref 0.40–1.20)
GFR: 78.67 mL/min (ref >60.00)
GLUCOSE: 136 mg/dL — AB (ref 70–99)
Potassium: 4.3 mEq/L (ref 3.5–5.1)
SODIUM: 137 meq/L (ref 135–145)
Total Protein: 7 g/dL (ref 6.0–8.3)

## 2017-11-28 LAB — VITAMIN D 25 HYDROXY (VIT D DEFICIENCY, FRACTURES): VITD: 52.26 ng/mL (ref 30.00–100.00)

## 2017-11-28 LAB — TSH: TSH: 1.78 u[IU]/mL (ref 0.35–4.50)

## 2017-11-29 LAB — PTH, INTACT AND CALCIUM
CALCIUM: 10.4 mg/dL (ref 8.6–10.4)
PTH: 114 pg/mL — AB (ref 14–64)

## 2017-12-04 ENCOUNTER — Encounter: Payer: Self-pay | Admitting: Internal Medicine

## 2017-12-04 DIAGNOSIS — E213 Hyperparathyroidism, unspecified: Secondary | ICD-10-CM

## 2017-12-12 ENCOUNTER — Other Ambulatory Visit: Payer: Self-pay

## 2017-12-15 ENCOUNTER — Encounter: Payer: Self-pay | Admitting: Internal Medicine

## 2017-12-19 ENCOUNTER — Encounter: Payer: Self-pay | Admitting: Internal Medicine

## 2017-12-19 ENCOUNTER — Ambulatory Visit (INDEPENDENT_AMBULATORY_CARE_PROVIDER_SITE_OTHER): Payer: Medicare Other | Admitting: Internal Medicine

## 2017-12-19 VITALS — BP 126/72 | HR 65 | Ht 59.0 in | Wt 176.0 lb

## 2017-12-19 DIAGNOSIS — M810 Age-related osteoporosis without current pathological fracture: Secondary | ICD-10-CM

## 2017-12-19 DIAGNOSIS — E213 Hyperparathyroidism, unspecified: Secondary | ICD-10-CM

## 2017-12-19 LAB — BASIC METABOLIC PANEL
BUN: 19 mg/dL (ref 6–23)
CALCIUM: 10.9 mg/dL — AB (ref 8.4–10.5)
CO2: 33 mEq/L — ABNORMAL HIGH (ref 19–32)
Chloride: 97 mEq/L (ref 96–112)
Creatinine, Ser: 0.74 mg/dL (ref 0.40–1.20)
GFR: 79.89 mL/min (ref >60.00)
Glucose, Bld: 100 mg/dL — ABNORMAL HIGH (ref 70–99)
POTASSIUM: 4.4 meq/L (ref 3.5–5.1)
SODIUM: 135 meq/L (ref 135–145)

## 2017-12-19 LAB — ALBUMIN: Albumin: 4.4 g/dL (ref 3.5–5.2)

## 2017-12-19 NOTE — Progress Notes (Signed)
Name: Anna Adams  MRN/ DOB: [id number], 01-Jan-1935    Age/ Sex: 83 y.o., female    PCP: Binnie Rail, MD   Reason for Endocrinology Evaluation: Hypercalcemia     Date of Initial Endocrinology Evaluation: 12/19/2017     HPI: [name] is a 83 y.o. female with a past medical history of HTN, hypothyroidism, GERD and Hx of renal calculi. The patient presented for initial endocrinology clinic visit on 12/19/2017 for consultative assistance with her hypercalcemia.   Anna Adams indicates that she was first diagnosed with hypercalcemia many years ago. Since that time, she has not experienced symptoms of constipation, polyuria, polydipsia, generalized weakness, diffuse muscle pains, significant memory impairment. She denies use of over the counter calcium (including supplements, Tums, Rolaids, or other calcium containing antacids),or lithium.  She was on HCTZ, but this was stopped in September,2019 She is on 1000 units of Vitamin D     She has had history of kidney stones ~ 30 yrs ago , initially this was associated with calcium tablet  intake she stopped ever since , her last renal stone > 10 yrs ago, she denies  kidney disease, liver disease, granulomatous disease. She has osteoporosis but refuses treatment,  She had an ankle fracture years ago. Daily dietary calcium intake: 1 servings (milk). She denies family history of osteoporosis, parathyroid disease, thyroid disease.    HISTORY:  Past Medical History:  Past Medical History:  Diagnosis Date  . ALLERGIC RHINITIS   . Anemia   . Asthma   . Asthma   . Cataract   . GERD (gastroesophageal reflux disease)    with HH  . History of renal calculi   . Hypertension   . Hypothyroidism   . Osteoarthritis   . Parotid tumor    L side, s/p resection 1980 and 2007   Past Surgical History:  Past Surgical History:  Procedure Laterality Date  . CARDIOVASCULAR STRESS TEST  07/12/2005   EF 85%  . CERVICAL DISCECTOMY  1993  . [place]   (L) wrist x's 2  . KNEE ARTHROPLASTY  04/22/2006   DR. WQuillian Quince CAFFREY  . Left knee replacement  2005  . LUMBAR DISC SURGERY    . PAROTID GLAND TUMOR EXCISION  1980, 2007   Left  . right knee replacement  2008  . TONSILLECTOMY AND ADENOIDECTOMY    . TUBAL LIGATION        Social History:  reports that she quit smoking about 55 years ago. She has never used smokeless tobacco. She reports that she does not drink alcohol or use drugs.  Family History: family history includes Asthma in her mother; Cancer in her mother; Heart disease in her father, maternal grandfather, and mother; Hyperlipidemia in her father; Hypertension in her father and paternal grandfather; Mental illness in her father; Stroke in her father and maternal grandmother.   HOME MEDICATIONS: Current Outpatient Medications on File Prior to Visit  Medication Sig Dispense Refill  . amLODipine (NORVASC) 5 MG tablet Take 1 tablet (5 mg total) by mouth daily. 90 tablet 3  . atorvastatin (LIPITOR) 20 MG tablet Take 1 tablet (20 mg total) by mouth daily. 90 tablet 0  . b complex vitamins tablet Take 1 tablet by mouth daily.      . cholecalciferol (VITAMIN D) 1000 UNITS tablet Take 1,000 Units by mouth daily.      Marland Kitchen etodolac (LODINE XL) 400 MG 24 hr  tablet Take 1 tablet (400 mg total) by mouth daily. 90 tablet 0  . famotidine (PEPCID) 20 MG tablet Take 1 tablet (20 mg total) by mouth daily. 90 tablet 1  . hydrocortisone-pramoxine (ANALPRAM HC) 2.5-1 % rectal cream Place 1 application rectally 3 (three) times daily. 30 g 0  . levothyroxine (SYNTHROID, LEVOTHROID) 112 MCG tablet Take 1 tablet (112 mcg total) by mouth daily. 90 tablet 0  . losartan (COZAAR) 100 MG tablet Take 1 tablet (100 mg total) by mouth daily. 90 tablet 3  . meclizine (ANTIVERT) 25 MG tablet Take 1 tablet (25 mg total) by mouth 3 (three) times daily as needed for dizziness. 30 tablet 5  . Multiple Vitamin (MULTIVITAMIN) tablet Take 1  tablet by mouth daily.    Marland Kitchen PROVENTIL HFA 108 (90 Base) MCG/ACT inhaler INHALE 1-2 PUFFS INTO THE LUNGS EVERY 6 (SIX) HOURS AS NEEDED FOR WHEEZING. 6.7 Inhaler 7  . UNABLE TO FIND at bedtime. Osteo-ease    . vitamin E 200 UNIT capsule Take 200 Units by mouth daily.       No current facility-administered medications on file prior to visit.       REVIEW OF SYSTEMS: A comprehensive ROS was conducted with the patient and is negative except as per HPI and below:  Review of Systems  Constitutional: Negative for malaise/fatigue and weight loss.  HENT: Negative for congestion and sore throat.   Eyes: Negative for blurred vision and pain.  Respiratory: Negative for cough and shortness of breath.   Cardiovascular: Negative for chest pain and palpitations.  Gastrointestinal: Positive for constipation. Negative for nausea.  Genitourinary: Positive for frequency.  Skin: Negative for itching and rash.  Neurological: Negative for tingling and tremors.  Endo/Heme/Allergies: Negative for polydipsia.  Psychiatric/Behavioral: Negative for depression. The patient is not nervous/anxious.        OBJECTIVE:  VS: BP 126/72   Pulse 65   Ht _0  (1.499 m)   Wt 176 lb (79.8 kg)   SpO2 95%   BMI 35.55 kg/m    Wt Readings from Last 3 Encounters:  12/19/17 176 lb (79.8 kg)  10/12/17 169 lb (76.7 kg)  04/06/17 175 lb (79.4 kg)     EXAM: General: Pt appears well and is in NAD  Hydration: Well-hydrated with moist mucous membranes and good skin turgor  Eyes: External eye exam normal without stare, lid lag or exophthalmos.  EOM intact.  PERRL.  Ears, Nose, Throat: Hearing: Grossly intact bilaterally Dental: Good dentition  Throat: Clear without mass, erythema or exudate  Neck: General: Supple without adenopathy. Thyroid: Thyroid size normal.  No goiter or nodules appreciated. No thyroid bruit.  Lungs: Clear with good BS bilat with no rales, rhonchi, or wheezes  Heart: Auscultation: RRR.    Abdomen: Normoactive bowel sounds, soft, nontender, without masses or organomegaly palpable  Extremities: BL UE: Normal ROM and strength. BL LE: Trace pretibial edema normal ROM and strength.  Skin: Hair: Texture and amount normal with gender appropriate distribution Skin Inspection: No rashes. Skin Palpation: Skin temperature, texture, and thickness normal to palpation  Neuro: Cranial nerves: II - XII grossly intact  Motor: Normal strength throughout DTRs: 2+ and symmetric in UE without delay in relaxation phase  Mental Status: Judgment, insight: Intact Orientation: Oriented to time, place, and person Mood and affect: No depression, anxiety, or agitation     DATA REVIEWED: Results for Anna Adams, Anna Adams (MRN [id number]) as of 12/19/2017 16:27  Ref. Range 10/12/2017 14:39 11/28/2017 14:12  PTH, Intact Latest Ref Range: 14 - 64 pg/mL 88 (H) 114 (H)  Results for Anna Adams, Anna Adams (MRN [id number]) as of 12/19/2017 16:27  Ref. Range 11/28/2017 14:12  TSH Latest Ref Range: 0.35 - 4.50 uIU/mL 1.78   Results for Anna Adams, Anna Adams (MRN [id number]) as of 12/19/2017 16:27  Ref. Range 11/28/2017 14:12  Sodium Latest Ref Range: 135 - 145 mEq/L 137  Potassium Latest Ref Range: 3.5 - 5.1 mEq/L 4.3  Chloride Latest Ref Range: 96 - 112 mEq/L 100  CO2 Latest Ref Range: 19 - 32 mEq/L 31  Glucose Latest Ref Range: 70 - 99 mg/dL 136 (H)  BUN Latest Ref Range: 6 - 23 mg/dL 21  Creatinine Latest Ref Range: 0.40 - 1.20 mg/dL 0.75  Calcium Latest Ref Range: 8.4 - 10.5 mg/dL 10.4  Alkaline Phosphatase Latest Ref Range: 39 - 117 U/L 89  Albumin Latest Ref Range: 3.5 - 5.2 g/dL 4.4  AST Latest Ref Range: 0 - 37 U/L 23  ALT Latest Ref Range: 0 - 35 U/L 15  Total Protein Latest Ref Range: 6.0 - 8.3 g/dL 7.0  Total Bilirubin Latest Ref Range: 0.2 - 1.2 mg/dL 0.5  GFR Latest Ref Range: >60.00 mL/min 78.67  VITD Latest Ref Range: 30.00 - 100.00 ng/mL 52.26  Results for Anna Adams, Anna Adams (MRN [id number]) as of 12/20/2017 07:53   Ref. Range 12/19/2017 15:53  Sodium Latest Ref Range: 135 - 145 mEq/L 135  Potassium Latest Ref Range: 3.5 - 5.1 mEq/L 4.4  Chloride Latest Ref Range: 96 - 112 mEq/L 97  CO2 Latest Ref Range: 19 - 32 mEq/L 33 (H)  Glucose Latest Ref Range: 70 - 99 mg/dL 100 (H)  BUN Latest Ref Range: 6 - 23 mg/dL 19  Creatinine Latest Ref Range: 0.40 - 1.20 mg/dL 0.74  Calcium Latest Ref Range: 8.4 - 10.5 mg/dL 10.9 (H)  Albumin Latest Ref Range: 3.5 - 5.2 g/dL 4.4  GFR Latest Ref Range: >60.00 mL/min 79.89      DXA 10/29/2011: Osteoporosis    Old records , labs and images have been reviewed.        ASSESSMENT/PLAN/RECOMMENDATIONS:   1. PTH - Mediated Hyperparathyroidism:  - We discussed D/D of Primary hyperparathyroidism (pHPT) vs Familial Hypocalciuric hypercalcemia ([place]) - She does have [place], she does not need any further intervention but is she has pHPT , she will need  further evaluation to determine surgical candidacy  - Her serum clacium on 11/4 was 10.4 mg/dL and corrected (to albumin ) 10.08 mg/dL. Repeat calcium today is 11.9 mg/dL (corrected 10.56)  - Will obtain 24-hr urine collection for calcium/creatinine first.  - Criteria for surgical intervention (Serum calcium concentration of 1.0 mg/dL or more above the upper limit of normal,Estimated glomerular filtration rate (eGFR) <60 mL/min, evidence of Osteoporosis on bone density, Twenty-four-hour urinary calcium >300 mg/day ,Nephrolithiasis or nephrocalcinosis by radiograph, Age less than 50 years.) - Encouraged hydration  - AVOID CALCIUM SUPPLEMENTS, AVOID LOW CALCIUM DIET - Maintain normal dietary calcium intake (2-3 servings of dairy a day) - Continue Vitamin D 1000 units daily   2. Osteoporosis :  - Unclear if this is related to hyperparathyroidism or age related.  - Pt declined any treatment at this time.  - pt needs 2-3 servings of calcium a day and continue Vitamin D 1000 iu daily.    F/u in 3 months    Signed electronically  by: Mack Guise, MD  [facility]  [place]  [address], [address] [place], [place] [address] Phone: [phone] FAX: [phone]   CC: Binnie Rail, MD [place] [address] Phone: [phone] Fax: [phone]   Return to Endocrinology clinic as below: Future Appointments  Date Time Provider [place]  04/12/2018  1:30 PM Binnie Rail, MD [id number]

## 2017-12-19 NOTE — Patient Instructions (Signed)
-   Please make sure you stay hydrated - AVOID CALCIUM SUPPLEMENTS, AVOID LOW CALCIUM DIET - Maintain normal dietary calcium intake (2-3 servings of dairy a day)  - FOODS THAT CONTAIN CALCIUM  Calcium can be found in many foods, not only in dairy products.  Dairy Foods  Yogurt (1 cup) 350 mg  Milk (1 cup) 300 mg  Cheddar cheese (1 oz.) 204 mg  Ricotta cheese, part skim (1/4 cup) 169 mg  Cottage cheese (1 cup) 150 mg  Nondairy Foods  Whole Grain Total cereal (3/4 cup) 1000 mg  Pink salmon with bones, sardines (3 oz., cooked) 181 mg  Black beans (1 cup) 103 mg  Broccoli (1 cup, cooked) 150 mg  Almonds (1 tbsp.) 50 mg  Soy Products  Soy yogurt with calcium (3/4 cup) 300 mg  Soy milk enriched with calcium (1 cup) 300 mg  Tofu, firm or extra firm (1/4 cup) 250 mg  Soy nuts, roasted/salted (1/2 cup) 103 mg    24-Hour Urine Collection   You will be collecting your urine for a 24-hour period of time.  Your timer starts with your first urine of the morning (For example - If you first pee at [place], your timer will start at [place])  [place] away your first urine of the morning  Collect your urine every time you pee for the next 24 hours STOP your urine collection 24 hours after you started the collection (For example - You would stop at 9AM the day after you started)

## 2017-12-21 LAB — PTH, INTACT AND CALCIUM
CALCIUM: 10.9 mg/dL — AB (ref 8.6–10.4)
PTH: 68 pg/mL — ABNORMAL HIGH (ref 14–64)

## 2018-01-03 ENCOUNTER — Other Ambulatory Visit: Payer: Medicare Other

## 2018-01-03 ENCOUNTER — Other Ambulatory Visit: Payer: Self-pay | Admitting: Internal Medicine

## 2018-01-03 DIAGNOSIS — E213 Hyperparathyroidism, unspecified: Secondary | ICD-10-CM

## 2018-01-05 LAB — CALCIUM, URINE, 24 HOUR
Calcium, 24H Urine: 171.6 mg/24 hr (ref 100.0–300.0)
Calcium, Urine: 26.4 mg/dL

## 2018-01-05 LAB — CREATINE, 24-HOUR URINE
CREAT CONC: 0.2 mg/dL
CREATININE 24H UR: 1 mg/(24.h) (ref 0–80)

## 2018-01-08 ENCOUNTER — Other Ambulatory Visit: Payer: Self-pay | Admitting: Internal Medicine

## 2018-01-10 ENCOUNTER — Other Ambulatory Visit: Payer: Self-pay | Admitting: Internal Medicine

## 2018-01-30 ENCOUNTER — Other Ambulatory Visit: Payer: Medicare Other

## 2018-02-07 ENCOUNTER — Other Ambulatory Visit: Payer: Self-pay | Admitting: Endocrinology

## 2018-02-07 ENCOUNTER — Other Ambulatory Visit: Payer: Medicare Other

## 2018-02-08 DIAGNOSIS — H40053 Ocular hypertension, bilateral: Secondary | ICD-10-CM | POA: Diagnosis not present

## 2018-02-08 DIAGNOSIS — H5203 Hypermetropia, bilateral: Secondary | ICD-10-CM | POA: Diagnosis not present

## 2018-02-08 DIAGNOSIS — H25813 Combined forms of age-related cataract, bilateral: Secondary | ICD-10-CM | POA: Diagnosis not present

## 2018-02-08 DIAGNOSIS — H40033 Anatomical narrow angle, bilateral: Secondary | ICD-10-CM | POA: Diagnosis not present

## 2018-02-09 LAB — CREATININE, URINE, 24 HOUR
Creatinine, 24H Ur: 748 mg/24 hr — ABNORMAL LOW (ref 800–1800)
Creatinine, Urine: 34 mg/dL

## 2018-02-09 LAB — CALCIUM, URINE, 24 HOUR
Calcium, 24H Urine: 250.8 mg/24 hr (ref 100.0–300.0)
Calcium, Urine: 11.4 mg/dL

## 2018-02-20 ENCOUNTER — Encounter: Payer: Self-pay | Admitting: Podiatry

## 2018-02-20 ENCOUNTER — Ambulatory Visit (INDEPENDENT_AMBULATORY_CARE_PROVIDER_SITE_OTHER): Payer: Medicare Other | Admitting: Podiatry

## 2018-02-20 DIAGNOSIS — M79676 Pain in unspecified toe(s): Secondary | ICD-10-CM

## 2018-02-20 DIAGNOSIS — B351 Tinea unguium: Secondary | ICD-10-CM

## 2018-02-24 ENCOUNTER — Telehealth: Payer: Self-pay | Admitting: Internal Medicine

## 2018-02-24 NOTE — Telephone Encounter (Signed)
Calcium Creatinine ratio of 24-hr urine collection is 0.023 which is consistent with Primary Hyperparathyroidism.   Pt meets surgical criteria for parathyroidectomy based on osteoporosis diagnosis.   On her last visit she declined treatment for osteoporosis. Will discuss this further on her next visit on 03/21/18    Mack Guise, MD  [facility]  [facility] [address] [place], [place] [address] Phone: [phone] FAX: [phone]

## 2018-02-26 NOTE — Progress Notes (Signed)
   SUBJECTIVE Patient presents to office today complaining of elongated, thickened nails that cause pain while ambulating in shoes. She is unable to trim her own nails. Patient is here for further evaluation and treatment.  Past Medical History:  Diagnosis Date  . ALLERGIC RHINITIS   . Anemia   . Asthma   . Asthma   . Cataract   . GERD (gastroesophageal reflux disease)    with HH  . History of renal calculi   . Hypertension   . Hypothyroidism   . Osteoarthritis   . Parotid tumor    L side, s/p resection 1980 and 2007    OBJECTIVE General Patient is awake, alert, and oriented x 3 and in no acute distress. Derm Skin is dry and supple bilateral. Negative open lesions or macerations. Remaining integument unremarkable. Nails are tender, long, thickened and dystrophic with subungual debris, consistent with onychomycosis, 1-5 bilateral. No signs of infection noted. Vasc  DP and PT pedal pulses palpable bilaterally. Temperature gradient within normal limits.  Neuro Epicritic and protective threshold sensation grossly intact bilaterally.  Musculoskeletal Exam No symptomatic pedal deformities noted bilateral. Muscular strength within normal limits.  ASSESSMENT 1. Onychodystrophic nails 1-5 bilateral with hyperkeratosis of nails.  2. Onychomycosis of nail due to dermatophyte bilateral 3. Pain in foot bilateral  PLAN OF CARE 1. Patient evaluated today.  2. Instructed to maintain good pedal hygiene and foot care.  3. Mechanical debridement of nails 1-5 bilaterally performed using a nail nipper. Filed with dremel without incident.  4. Return to clinic in 3 mos.    Brent M. Evans, DPM [facility]  Dr. Brent M. Evans, DPM    [address]                                        [place], [place] [address]                Office [phone]  Fax [phone]     

## 2018-03-01 DIAGNOSIS — H40033 Anatomical narrow angle, bilateral: Secondary | ICD-10-CM | POA: Diagnosis not present

## 2018-03-01 DIAGNOSIS — H40053 Ocular hypertension, bilateral: Secondary | ICD-10-CM | POA: Diagnosis not present

## 2018-03-01 DIAGNOSIS — H25813 Combined forms of age-related cataract, bilateral: Secondary | ICD-10-CM | POA: Diagnosis not present

## 2018-03-09 DIAGNOSIS — H40001 Preglaucoma, unspecified, right eye: Secondary | ICD-10-CM | POA: Diagnosis not present

## 2018-03-09 DIAGNOSIS — H40031 Anatomical narrow angle, right eye: Secondary | ICD-10-CM | POA: Diagnosis not present

## 2018-03-16 DIAGNOSIS — H40002 Preglaucoma, unspecified, left eye: Secondary | ICD-10-CM | POA: Diagnosis not present

## 2018-03-16 DIAGNOSIS — H40032 Anatomical narrow angle, left eye: Secondary | ICD-10-CM | POA: Diagnosis not present

## 2018-03-21 ENCOUNTER — Ambulatory Visit (INDEPENDENT_AMBULATORY_CARE_PROVIDER_SITE_OTHER): Payer: Medicare Other | Admitting: Internal Medicine

## 2018-03-21 ENCOUNTER — Encounter: Payer: Self-pay | Admitting: Internal Medicine

## 2018-03-21 VITALS — BP 158/82 | HR 83 | Ht 59.0 in | Wt 173.4 lb

## 2018-03-21 DIAGNOSIS — E213 Hyperparathyroidism, unspecified: Secondary | ICD-10-CM

## 2018-03-21 DIAGNOSIS — D351 Benign neoplasm of parathyroid gland: Secondary | ICD-10-CM

## 2018-03-21 LAB — BASIC METABOLIC PANEL
BUN: 20 mg/dL (ref 6–23)
CO2: 35 mEq/L — ABNORMAL HIGH (ref 19–32)
Calcium: 10.9 mg/dL — ABNORMAL HIGH (ref 8.4–10.5)
Chloride: 98 mEq/L (ref 96–112)
Creatinine, Ser: 0.84 mg/dL (ref 0.40–1.20)
GFR: 64.9 mL/min (ref >60.00)
Glucose, Bld: 124 mg/dL — ABNORMAL HIGH (ref 70–99)
Potassium: 4.6 mEq/L (ref 3.5–5.1)
SODIUM: 137 meq/L (ref 135–145)

## 2018-03-21 LAB — ALBUMIN: Albumin: 4.4 g/dL (ref 3.5–5.2)

## 2018-03-21 NOTE — Patient Instructions (Signed)
-   Please make sure you stay hydrated - AVOID CALCIUM SUPPLEMENTS, AVOID LOW CALCIUM DIET - Maintain normal dietary calcium intake (2-3 servings of dairy a day)  - FOODS THAT CONTAIN CALCIUM  Calcium can be found in many foods, not only in dairy products.  Dairy Foods  Yogurt (1 cup) 350 mg  Milk (1 cup) 300 mg  Cheddar cheese (1 oz.) 204 mg  Ricotta cheese, part skim (1/4 cup) 169 mg  Cottage cheese (1 cup) 150 mg  Nondairy Foods  Whole Grain Total cereal (3/4 cup) 1000 mg  Pink salmon with bones, sardines (3 oz., cooked) 181 mg  Black beans (1 cup) 103 mg  Broccoli (1 cup, cooked) 150 mg  Almonds (1 tbsp.) 50 mg  Soy Products  Soy yogurt with calcium (3/4 cup) 300 mg  Soy milk enriched with calcium (1 cup) 300 mg  Tofu, firm or extra firm (1/4 cup) 250 mg  Soy nuts, roasted/salted (1/2 cup) 103 mg

## 2018-03-21 NOTE — Progress Notes (Signed)
Name: Anna Adams  MRN/ DOB: [id number], 10-29-1934    Age/ Sex: 84 y.o., female     PCP: Binnie Rail, MD   Reason for Endocrinology Evaluation:  Hypercalcemia     Initial Endocrinology Clinic Visit:  12/19/2017    PATIENT IDENTIFIER: Anna Adams is a 84 y.o., female with a past medical history of hypertension, hypothyroidism, GERD and history of renal calculi. She has followed with [facility] since 12/19/2017 for consultative assistance with management of her hypercalcemia.   HISTORICAL SUMMARY: The patient was first diagnosed with hypercalcemia more than 30 years ago.  Patient has been asymptomatic except for the renal stones over 30 years ago.  Her last kidney stone diagnosis was over 10 years ago.  She was diagnosed with osteoporosis in 2013.  Patient has declined treatment for osteoporosis and has declined parathyroidectomy.  24-hour urine collection on 02/07/2018 showed a 24-hour urine collection of 250.8 mg per 24 hours.  Her calcium creatinine ratio is 0.023 which is consistent with primary hyperparathyroidism.  HCTZ was stopped in September 2019  SUBJECTIVE:    Today (03/21/2018):  Anna Adams is here for for 51-monthfollow-up on her hyperparathyroidism.  Since her last visit she denies any polyuria, polydipsia, constipation or generalized weakness.  She denies any new bone fractures, and she denies any renal calculi since her last visit  She continues to be on vitamin D3 1000 units daily She has been avoiding calcium supplements, she has been trying to consume 2 servings of calcium daily.  ROS:  As per HPI.   HISTORY:  Past Medical History:  Past Medical History:  Diagnosis Date  . ALLERGIC RHINITIS   . Anemia   . Asthma   . Asthma   . Cataract   . GERD (gastroesophageal reflux disease)    with HH  . History of renal calculi   . Hypertension   . Hypothyroidism   . Osteoarthritis   . Parotid tumor    L side, s/p resection 1980 and 2007      Past Surgical History:  Past Surgical History:  Procedure Laterality Date  . CARDIOVASCULAR STRESS TEST  07/12/2005   EF 85%  . CERVICAL DISCECTOMY  1993  . [place]  (L) wrist x's 2  . KNEE ARTHROPLASTY  04/22/2006   DR. W.Quillian QuinceCAFFREY  . Left knee replacement  2005  . LUMBAR DISC SURGERY    . PAROTID GLAND TUMOR EXCISION  1980, 2007   Left  . right knee replacement  2008  . TONSILLECTOMY AND ADENOIDECTOMY    . TUBAL LIGATION       Social History:  reports that she quit smoking about 55 years ago. She has never used smokeless tobacco. She reports that she does not drink alcohol or use drugs. Family History:  Family History  Problem Relation Age of Onset  . Asthma Mother   . Cancer Mother   . Heart disease Mother   . Hypertension Father   . Heart disease Father   . Hyperlipidemia Father   . Stroke Father   . Mental illness Father   . Stroke Maternal Grandmother   . Heart disease Maternal Grandfather   . Hypertension Paternal Grandfather       HOME MEDICATIONS: Allergies as of 03/21/2018      Reactions   Dyphylline-guaifenesin    Erythromycin Nausea Only   Mevacor [lovastatin]    myalgias   Other  CT scan dye      Medication List       Accurate as of March 21, 2018  2:11 PM. Always use your most recent med list.        amLODipine 5 MG tablet Commonly known as:  NORVASC Take 1 tablet (5 mg total) by mouth daily.   atorvastatin 20 MG tablet Commonly known as:  LIPITOR TAKE 1 TABLET DAILY   b complex vitamins tablet Take 1 tablet by mouth daily.   cholecalciferol 1000 units tablet Commonly known as:  VITAMIN D Take 1,000 Units by mouth daily.   etodolac 400 MG 24 hr tablet Commonly known as:  LODINE XL TAKE 1 TABLET DAILY   famotidine 20 MG tablet Commonly known as:  PEPCID Take 1 tablet (20 mg total) by mouth daily.   hydrocortisone-pramoxine 2.5-1 % rectal cream Commonly known as:  ANALPRAM HC Place  1 application rectally 3 (three) times daily.   losartan 100 MG tablet Commonly known as:  COZAAR Take 1 tablet (100 mg total) by mouth daily.   meclizine 25 MG tablet Commonly known as:  ANTIVERT Take 1 tablet (25 mg total) by mouth 3 (three) times daily as needed for dizziness.   multivitamin tablet Take 1 tablet by mouth daily.   PROVENTIL HFA 108 (90 Base) MCG/ACT inhaler Generic drug:  albuterol INHALE 1-2 PUFFS INTO THE LUNGS EVERY 6 (SIX) HOURS AS NEEDED FOR WHEEZING.   SYNTHROID 112 MCG tablet Generic drug:  levothyroxine TAKE 1 TABLET BY MOUTH EVERY DAY   UNABLE TO FIND at bedtime. Osteo-ease   vitamin E 200 UNIT capsule Take 200 Units by mouth daily.         OBJECTIVE:   PHYSICAL EXAM: VS: BP (!) 158/82 (BP Location: Left Arm, Patient Position: Sitting, Cuff Size: Normal)   Pulse 83   Ht '4\' 11"'  (1.499 m)   Wt 173 lb 6.4 oz (78.7 kg)   SpO2 90%   BMI 35.02 kg/m    EXAM: General: Pt appears well and is in NAD  Neck: General: Supple without adenopathy. Thyroid: Thyroid size normal.  No goiter or nodules appreciated. No thyroid bruit.  Lungs: Clear with good BS bilat with no rales, rhonchi, or wheezes  Heart: Auscultation: RRR.  Abdomen: Normoactive bowel sounds, soft, nontender, without masses or organomegaly palpable  Extremities:  BL LE: No pretibial edema normal ROM and strength.  Skin: Hair: Texture and amount normal with gender appropriate distribution Skin Inspection: No rashes. Skin Palpation: Skin temperature, texture, and thickness normal to palpation  Neuro: Cranial nerves: II - XII grossly intact  Motor: Normal strength throughout  Mental Status: Judgment, insight: Intact Orientation: Oriented to time, place, and person Mood and affect: No depression, anxiety, or agitation     DATA REVIEWED:  Results for JRUE, YAMBAO (MRN [id number]) as of 03/22/2018 07:34  Ref. Range 03/21/2018 14:44  Sodium Latest Ref Range: 135 - 145 mEq/L 137    Potassium Latest Ref Range: 3.5 - 5.1 mEq/L 4.6  Chloride Latest Ref Range: 96 - 112 mEq/L 98  CO2 Latest Ref Range: 19 - 32 mEq/L 35 (H)  Glucose Latest Ref Range: 70 - 99 mg/dL 124 (H)  BUN Latest Ref Range: 6 - 23 mg/dL 20  Creatinine Latest Ref Range: 0.40 - 1.20 mg/dL 0.84  Calcium Latest Ref Range: 8.4 - 10.5 mg/dL 10.9 (H)  Albumin Latest Ref Range: 3.5 - 5.2 g/dL 4.4  GFR Latest Ref Range: >60.00 mL/min 64.90  ASSESSMENT / PLAN / RECOMMENDATIONS:   1. Primary Hyperparathyroidism:   -Calcium levels have been stable, corrected serum calcium on today's labs is 10.6 mg/dL -She continues to be asymptomatic. -I have explained to her that she meets criteria for parathyroidectomy given her history of osteoporosis, patient is reluctant at this time due to her history of difficult intubation during surgeries.  I have also explained to her that if she continues to decline surgery we may consider antiresorptive therapy for her osteoporosis. -We also discussed the hypercalcemia if he gets to severely high levels can cause delirium, constipation, polyuria, polydipsia.  We also discussed that with hypercalcemia she is at risk of nephrocalcinosis and renal stones which can deteriorate her renal function. -We discussed obtaining a KUB but the patient states that she did have a CT scan at Dr. Carlton Adam office ( urology) she did sign ROI to obtain those records.   -In the meantime we will proceed with scheduling her for another DEXA at the breast center, that is where she had it last time.  - Criteria for surgical intervention (Serum calcium concentration of 1.0 mg/dL or more above the upper limit of normal,Estimated glomerular filtration rate (eGFR) <60 mL/min, evidence of Osteoporosis on bone density, Twenty-four-hour urinary calcium >300 mg/day ,Nephrolithiasis or nephrocalcinosis by radiograph, Age less than 50 years.) - Encouraged hydration  - AVOID CALCIUM SUPPLEMENTS, AVOID LOW CALCIUM  DIET - Maintain normal dietary calcium intake (2-3 servings of dairy a day)   Addendum: Received CT scan from the urology office dated Jun 21, 2017, there is a tiny nonobstructing right renal calculus.  No evidence of ureteral calculus or hydronephrosis. I have spoken to the patient about her CT scan results on March 22, 2018 at 10:50 AM , we also discussed previous diagnosis of osteoporosis, I have discussed with her again that she does meet criteria for parathyroidectomy.  Patient agreed to the surgery would like to see Dr. Erik Obey as he had previously operated on her parathyroid tumor.  Parathyroid scan has been ordered.  F/u 6 months    Signed electronically by: Mack Guise, MD  [facility] End[facility]in[facility]  [facility] [address] [place], [place] [address] Phone: [phone] FAX: [phone]      CC: Binnie Rail, MD [place] [address] Phone: [phone]  Fax: [phone]   Return to Endocrinology clinic as below: Future Appointments  Date Time Provider [facility] [place]  03/21/2018  2:20 PM Jolin Benavides, Melanie Crazier, MD [facility] None  04/12/2018  1:30 PM Quay Burow, Claudina Lick, MD [facility]

## 2018-03-22 LAB — PTH, INTACT AND CALCIUM
Calcium: 10.8 mg/dL — ABNORMAL HIGH (ref 8.6–10.4)
PTH: 53 pg/mL (ref 14–64)

## 2018-03-27 ENCOUNTER — Telehealth: Payer: Self-pay | Admitting: Internal Medicine

## 2018-03-27 NOTE — Telephone Encounter (Signed)
Referral has been faxed to Dr. Erik Obey

## 2018-03-27 NOTE — Telephone Encounter (Signed)
I can see that you worked on this referral, pt stated that the wrong office called her,can you help out with this?

## 2018-03-27 NOTE — Telephone Encounter (Signed)
Patient is requesting a call back to discuss a referral that Dr [facility] sent out. She is stating that it was sent to the incorrect Doctor and she wants to know why   pleasea dvise

## 2018-04-07 ENCOUNTER — Other Ambulatory Visit: Payer: Self-pay | Admitting: Internal Medicine

## 2018-04-10 DIAGNOSIS — K219 Gastro-esophageal reflux disease without esophagitis: Secondary | ICD-10-CM | POA: Insufficient documentation

## 2018-04-10 DIAGNOSIS — E213 Hyperparathyroidism, unspecified: Secondary | ICD-10-CM | POA: Diagnosis not present

## 2018-04-10 DIAGNOSIS — E069 Thyroiditis, unspecified: Secondary | ICD-10-CM | POA: Diagnosis not present

## 2018-04-10 DIAGNOSIS — M810 Age-related osteoporosis without current pathological fracture: Secondary | ICD-10-CM | POA: Diagnosis not present

## 2018-04-10 DIAGNOSIS — Z87442 Personal history of urinary calculi: Secondary | ICD-10-CM | POA: Insufficient documentation

## 2018-04-11 ENCOUNTER — Other Ambulatory Visit: Payer: Self-pay | Admitting: Internal Medicine

## 2018-04-11 DIAGNOSIS — Z1231 Encounter for screening mammogram for malignant neoplasm of breast: Secondary | ICD-10-CM

## 2018-04-11 NOTE — Patient Instructions (Addendum)
  Tests ordered today. Your results will be released to MyChart (or called to you) after review, usually within 72hours after test completion. If any changes need to be made, you will be notified at that same time.  Medications reviewed and updated.  Changes include :   none      Please followup in 6 months   

## 2018-04-11 NOTE — Progress Notes (Signed)
Subjective:    Patient ID: Anna Adams, female    DOB: Jun 15, 1934, 84 y.o.   MRN: [id number]  HPI The patient is here for follow up.  She has glaucoma in both eyes and had laser treatment two months ago.   Her right arm and hand has been intermittently going to sleep for a couple of years.  It has gotten worse.  She is on the computer a lot.  She wears a brace at night and it helps.  The pain, tingling/numbness is from her elbow to her hand.  It is mostly the first three fingers.   Shoulder pain:  Her b/l upper back areas are painful.  She uses bengay helps a little.  She has seen ortho.  She was told it was her shoulders the left is worse.  At this point she does not want to do anything further with this.  Parathyroid surgery  - she will likely have surgery with Dr Constance Holster.    Hypertension: She is taking her medication daily. She is compliant with a low sodium diet.  She denies chest pain, palpitations, and regular headaches. She is not exercising regularly.      Hypothyroidism:  She is taking her medication daily.  She denies any recent changes in energy or weight that are unexplained.   Prediabetes:  She is compliant with a low sugar/carbohydrate diet.  She is not exercising regularly.  GERD:  She is taking her medication daily as prescribed.  She denies any GERD symptoms and feels her GERD is well controlled.   Hyperlipidemia: She is taking her medication daily. She is compliant with a low fat/cholesterol diet. She is not exercising regularly.  Osteoarthritis:  She is taking etodolac daily.  Her arthritis pain is fairly controlled.    Medications and allergies reviewed with patient and updated if appropriate.  Patient Active Problem List   Diagnosis Date Noted  . Diverticulitis 10/12/2017  . Hypercalcemia 10/12/2017  . Prediabetes 10/11/2017  . Squamous cell skin cancer 04/01/2016  . Hiatal hernia, large 09/22/2015  . Pain in lower limb 03/04/2015  . Edema 11/26/2013  .  Parotid tumor 03/23/2013  . Dyslipidemia   . Onychomycosis due to dermatophyte 05/05/2012  . Osteoporosis, post-menopausal   . GERD (gastroesophageal reflux disease)   . Benign hypertensive heart disease without heart failure 04/28/2011  . Osteoarthritis 01/08/2011  . Hypothyroidism 02/07/2009  . Essential hypertension 02/07/2009  . ALLERGIC RHINITIS 02/07/2009  . Intrinsic asthma 02/07/2009    Current Outpatient Medications on File Prior to Visit  Medication Sig Dispense Refill  . amLODipine (NORVASC) 5 MG tablet Take 1 tablet (5 mg total) by mouth daily. 90 tablet 3  . atorvastatin (LIPITOR) 20 MG tablet TAKE 1 TABLET DAILY 90 tablet 1  . b complex vitamins tablet Take 1 tablet by mouth daily.      . cholecalciferol (VITAMIN D) 1000 UNITS tablet Take 1,000 Units by mouth daily.      Marland Kitchen etodolac (LODINE XL) 400 MG 24 hr tablet TAKE 1 TABLET DAILY 90 tablet 1  . famotidine (PEPCID) 20 MG tablet TAKE 1 TABLET BY MOUTH EVERY DAY 90 tablet 1  . hydrocortisone-pramoxine (ANALPRAM HC) 2.5-1 % rectal cream Place 1 application rectally 3 (three) times daily. 30 g 0  . losartan (COZAAR) 100 MG tablet Take 1 tablet (100 mg total) by mouth daily. 90 tablet 3  . meclizine (ANTIVERT) 25 MG tablet Take 1 tablet (25 mg total) by mouth 3 (three)  times daily as needed for dizziness. 30 tablet 5  . PROVENTIL HFA 108 (90 Base) MCG/ACT inhaler INHALE 1-2 PUFFS INTO THE LUNGS EVERY 6 (SIX) HOURS AS NEEDED FOR WHEEZING. 6.7 Inhaler 7  . SYNTHROID 112 MCG tablet TAKE 1 TABLET BY MOUTH EVERY DAY 90 tablet 1  . UNABLE TO FIND at bedtime. Osteo-ease    . vitamin E 200 UNIT capsule Take 200 Units by mouth daily.       No current facility-administered medications on file prior to visit.     Past Medical History:  Diagnosis Date  . ALLERGIC RHINITIS   . Anemia   . Asthma   . Asthma   . Cataract   . GERD (gastroesophageal reflux disease)    with HH  . History of renal calculi   . Hypertension   .  Hypothyroidism   . Osteoarthritis   . Parotid tumor    L side, s/p resection 1980 and 2007    Past Surgical History:  Procedure Laterality Date  . CARDIOVASCULAR STRESS TEST  07/12/2005   EF 85%  . CERVICAL DISCECTOMY  1993  . [place]   (L) wrist x's 2  . KNEE ARTHROPLASTY  04/22/2006   DR. WQuillian Quince CAFFREY  . Left knee replacement  2005  . LUMBAR DISC SURGERY    . PAROTID GLAND TUMOR EXCISION  1980, 2007   Left  . right knee replacement  2008  . TONSILLECTOMY AND ADENOIDECTOMY    . TUBAL LIGATION      Social History   Socioeconomic History  . Marital status: Married    Spouse name: Not on file  . Number of children: Not on file  . Years of education: Not on file  . Highest education level: Not on file  Occupational History  . Not on file  Social Needs  . Financial resource strain: Not on file  . Food insecurity:    Worry: Not on file    Inability: Not on file  . Transportation needs:    Medical: Not on file    Non-medical: Not on file  Tobacco Use  . Smoking status: Former Smoker    Last attempt to quit: 09/01/1962    Years since quitting: 55.6  . Smokeless tobacco: Never Used  Substance and Sexual Activity  . Alcohol use: No    Alcohol/week: 0.0 standard drinks  . Drug use: No  . Sexual activity: Not on file  Lifestyle  . Physical activity:    Days per week: Not on file    Minutes per session: Not on file  . Stress: Not on file  Relationships  . Social connections:    Talks on phone: Not on file    Gets together: Not on file    Attends religious service: Not on file    Active member of club or organization: Not on file    Attends meetings of clubs or organizations: Not on file    Relationship status: Not on file  Other Topics Concern  . Not on file  Social History Narrative   married, lives with spouse, retired Therapist, sports    Family History  Problem Relation Age of Onset  . Asthma Mother   . Cancer Mother   . Heart disease  Mother   . Hypertension Father   . Heart disease Father   . Hyperlipidemia Father   . Stroke Father   . Mental illness Father   . Stroke Maternal Grandmother   .  Heart disease Maternal Grandfather   . Hypertension Paternal Grandfather     Review of Systems  Constitutional: Negative for chills and fever.  Respiratory: Positive for shortness of breath (with walking). Negative for cough and wheezing.   Cardiovascular: Positive for leg swelling (no change). Negative for chest pain and palpitations.  Musculoskeletal: Positive for arthralgias.  Neurological: Negative for light-headedness and headaches.       Objective:   Vitals:   04/12/18 1330  BP: 136/70  Pulse: 69  Resp: 16  Temp: 97.8 F (36.6 C)  SpO2: 95%   BP Readings from Last 3 Encounters:  04/12/18 136/70  03/21/18 (!) 158/82  12/19/17 126/72   Wt Readings from Last 3 Encounters:  04/12/18 173 lb 6.4 oz (78.7 kg)  03/21/18 173 lb 6.4 oz (78.7 kg)  12/19/17 176 lb (79.8 kg)   Body mass index is 35.02 kg/m.   Physical Exam    Constitutional: Appears well-developed and well-nourished. No distress.  HENT:  Head: Normocephalic and atraumatic.  Neck: Neck supple. No tracheal deviation present. No thyromegaly present.  No cervical lymphadenopathy Cardiovascular: Normal rate, regular rhythm and normal heart sounds.   1/6 systolic murmur heard. No carotid bruit .  No edema Pulmonary/Chest: Effort normal and breath sounds normal. No respiratory distress. No has no wheezes. No rales.  Skin: Skin is warm and dry. Not diaphoretic.  Psychiatric: Normal mood and affect. Behavior is normal.      Assessment & Plan:    See Problem List for Assessment and Plan of chronic medical problems.

## 2018-04-12 ENCOUNTER — Encounter: Payer: Self-pay | Admitting: Internal Medicine

## 2018-04-12 ENCOUNTER — Other Ambulatory Visit: Payer: Self-pay | Admitting: Otolaryngology

## 2018-04-12 ENCOUNTER — Other Ambulatory Visit: Payer: Self-pay

## 2018-04-12 ENCOUNTER — Ambulatory Visit (INDEPENDENT_AMBULATORY_CARE_PROVIDER_SITE_OTHER): Payer: Medicare Other | Admitting: Internal Medicine

## 2018-04-12 ENCOUNTER — Other Ambulatory Visit (INDEPENDENT_AMBULATORY_CARE_PROVIDER_SITE_OTHER): Payer: Medicare Other

## 2018-04-12 VITALS — BP 136/70 | HR 69 | Temp 97.8°F | Resp 16 | Ht 59.0 in | Wt 173.4 lb

## 2018-04-12 DIAGNOSIS — R7303 Prediabetes: Secondary | ICD-10-CM

## 2018-04-12 DIAGNOSIS — E785 Hyperlipidemia, unspecified: Secondary | ICD-10-CM

## 2018-04-12 DIAGNOSIS — I10 Essential (primary) hypertension: Secondary | ICD-10-CM

## 2018-04-12 DIAGNOSIS — E039 Hypothyroidism, unspecified: Secondary | ICD-10-CM | POA: Diagnosis not present

## 2018-04-12 DIAGNOSIS — E213 Hyperparathyroidism, unspecified: Secondary | ICD-10-CM

## 2018-04-12 DIAGNOSIS — M1990 Unspecified osteoarthritis, unspecified site: Secondary | ICD-10-CM

## 2018-04-12 DIAGNOSIS — K219 Gastro-esophageal reflux disease without esophagitis: Secondary | ICD-10-CM | POA: Diagnosis not present

## 2018-04-12 DIAGNOSIS — G5601 Carpal tunnel syndrome, right upper limb: Secondary | ICD-10-CM | POA: Diagnosis not present

## 2018-04-12 LAB — CBC WITH DIFFERENTIAL/PLATELET
Basophils Absolute: 0 10*3/uL (ref 0.0–0.1)
Basophils Relative: 0.6 % (ref 0.0–3.0)
Eosinophils Absolute: 0.1 10*3/uL (ref 0.0–0.7)
Eosinophils Relative: 2 % (ref 0.0–5.0)
HCT: 42.4 % (ref 36.0–46.0)
HEMOGLOBIN: 14 g/dL (ref 12.0–15.0)
Lymphocytes Relative: 20 % (ref 12.0–46.0)
Lymphs Abs: 1.4 10*3/uL (ref 0.7–4.0)
MCHC: 33 g/dL (ref 30.0–36.0)
MCV: 87.9 fl (ref 78.0–100.0)
Monocytes Absolute: 0.6 10*3/uL (ref 0.1–1.0)
Monocytes Relative: 7.9 % (ref 3.0–12.0)
Neutro Abs: 5 10*3/uL (ref 1.4–7.7)
Neutrophils Relative %: 69.5 % (ref 43.0–77.0)
Platelets: 226 10*3/uL (ref 150.0–400.0)
RBC: 4.82 Mil/uL (ref 3.87–5.11)
RDW: 14 % (ref 11.5–15.5)
WBC: 7.2 10*3/uL (ref 4.0–10.5)

## 2018-04-12 LAB — COMPREHENSIVE METABOLIC PANEL
ALT: 15 U/L (ref 0–35)
AST: 23 U/L (ref 0–37)
Albumin: 4.4 g/dL (ref 3.5–5.2)
Alkaline Phosphatase: 91 U/L (ref 39–117)
BUN: 26 mg/dL — ABNORMAL HIGH (ref 6–23)
CO2: 34 meq/L — AB (ref 19–32)
Calcium: 10.9 mg/dL — ABNORMAL HIGH (ref 8.4–10.5)
Chloride: 98 mEq/L (ref 96–112)
Creatinine, Ser: 0.76 mg/dL (ref 0.40–1.20)
GFR: 72.83 mL/min (ref >60.00)
GLUCOSE: 110 mg/dL — AB (ref 70–99)
Potassium: 4.5 mEq/L (ref 3.5–5.1)
Sodium: 137 mEq/L (ref 135–145)
Total Bilirubin: 0.6 mg/dL (ref 0.2–1.2)
Total Protein: 7.1 g/dL (ref 6.0–8.3)

## 2018-04-12 LAB — LIPID PANEL
Cholesterol: 172 mg/dL (ref 0–200)
HDL: 81.2 mg/dL (ref >39.00)
LDL Cholesterol: 78 mg/dL (ref 0–99)
NonHDL: 90.59
Total CHOL/HDL Ratio: 2
Triglycerides: 63 mg/dL (ref 0.0–149.0)
VLDL: 12.6 mg/dL (ref 0.0–40.0)

## 2018-04-12 LAB — HEMOGLOBIN A1C: Hgb A1c MFr Bld: 5.7 % (ref 4.6–6.5)

## 2018-04-12 LAB — TSH: TSH: 2.73 u[IU]/mL (ref 0.35–4.50)

## 2018-04-12 NOTE — Assessment & Plan Note (Signed)
Following with endocrine Will likely have surgery-Dr. Constance Holster

## 2018-04-12 NOTE — Assessment & Plan Note (Signed)
BP well controlled Current regimen effective and well tolerated Continue current medications at current doses CMP 

## 2018-04-12 NOTE — Assessment & Plan Note (Signed)
Check lipid panel  Continue daily statin Regular exercise and healthy diet encouraged  

## 2018-04-12 NOTE — Assessment & Plan Note (Signed)
GERD controlled Continue daily medication  

## 2018-04-12 NOTE — Assessment & Plan Note (Signed)
Taking etodolac daily Pain fairly controlled, but still has chronic pain Continue

## 2018-04-12 NOTE — Assessment & Plan Note (Signed)
Check a1c Low sugar / carb diet Stressed regular exercise   

## 2018-04-12 NOTE — Assessment & Plan Note (Signed)
Clinically euthyroid Check tsh  Titrate med dose if needed  

## 2018-04-12 NOTE — Assessment & Plan Note (Signed)
Right arm symptoms consistent with carpal tunnel syndrome She is wearing the brace and will try to wear it more during the day Advised ergonomics with using the computer Deferred referral at this time

## 2018-05-08 ENCOUNTER — Other Ambulatory Visit: Payer: Medicare Other

## 2018-05-22 ENCOUNTER — Other Ambulatory Visit: Payer: Medicare Other

## 2018-05-22 ENCOUNTER — Ambulatory Visit: Payer: Medicare Other

## 2018-06-14 ENCOUNTER — Other Ambulatory Visit: Payer: Medicare Other

## 2018-06-28 ENCOUNTER — Ambulatory Visit
Admission: RE | Admit: 2018-06-28 | Discharge: 2018-06-28 | Disposition: A | Discharge status: Home / Self Care | Payer: Medicare Other | Source: Ambulatory Visit | Attending: Otolaryngology | Admitting: Otolaryngology

## 2018-06-28 DIAGNOSIS — E213 Hyperparathyroidism, unspecified: Secondary | ICD-10-CM

## 2018-06-28 DIAGNOSIS — E0789 Other specified disorders of thyroid: Secondary | ICD-10-CM | POA: Diagnosis not present

## 2018-06-28 IMAGING — US US THYROID
1 series · 13 of 25 positions shown · non-contrast
Comparison: None.

CLINICAL DATA: Other.  Hyperparathyroidism.

EXAM:
THYROID ULTRASOUND
TECHNIQUE: Ultrasound examination of the thyroid gland and adjacent soft
tissues was performed.

[Series 1: us thyroid · 0.06mm/px · 13 of 38 slices shown]
[im 1/38]
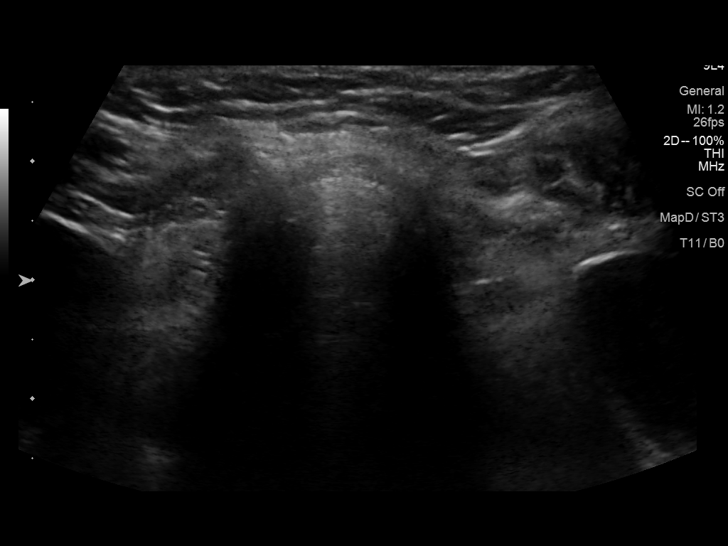
[im 4/38]
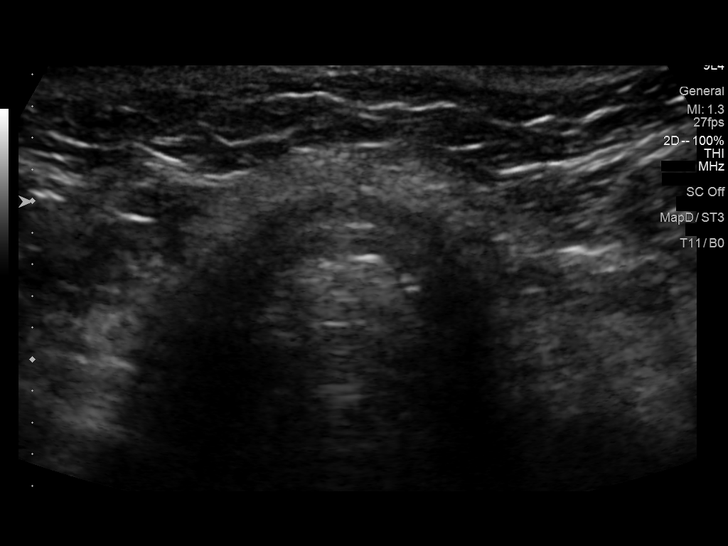
[im 7/38]
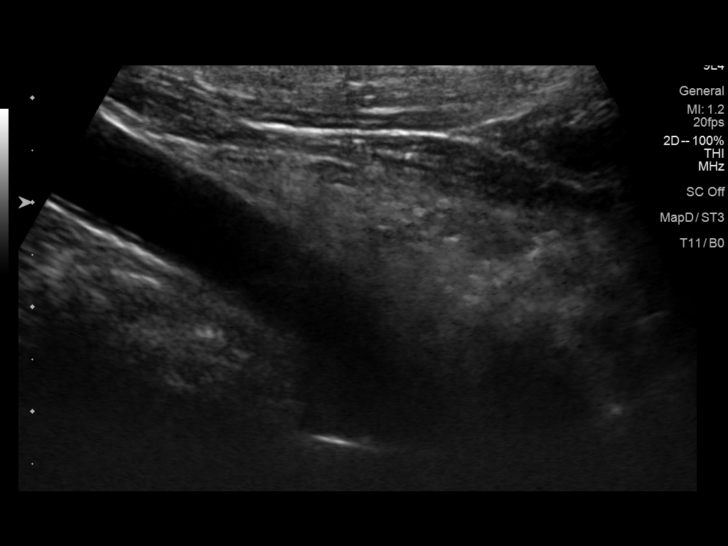
[im 10/38]
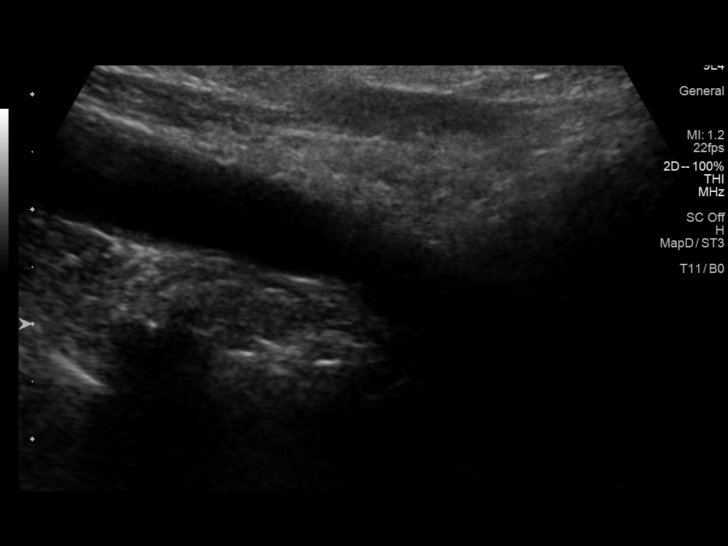
[im 13/38]
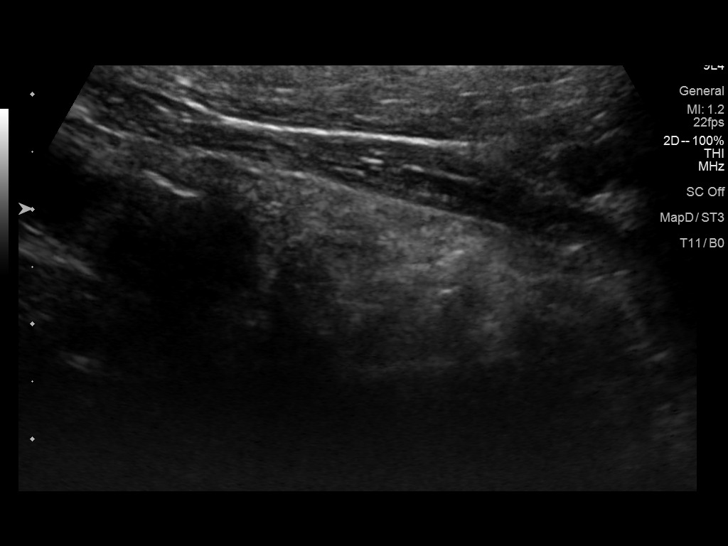
[im 16/38]
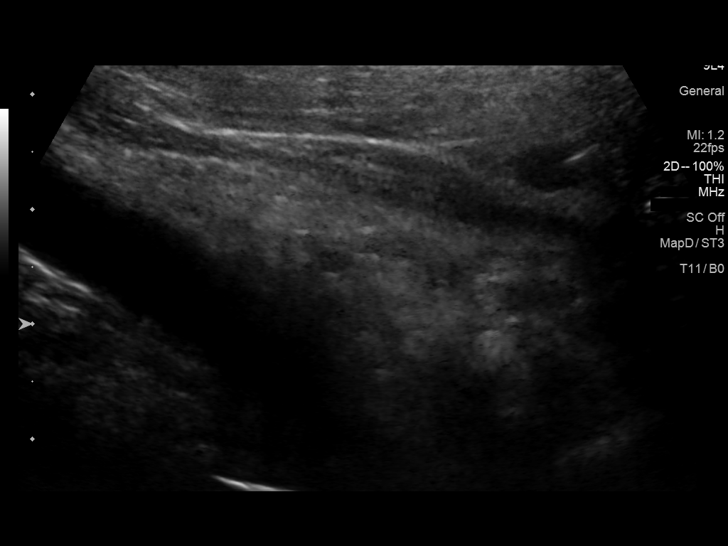
[im 19/38]
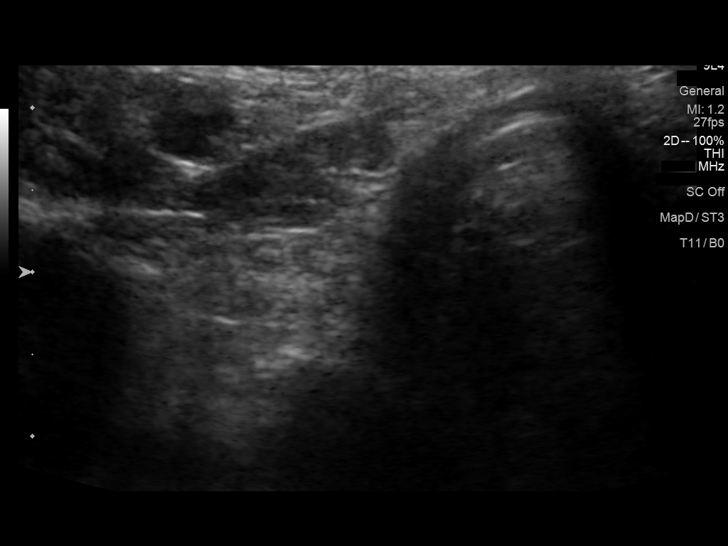
[im 22/38]
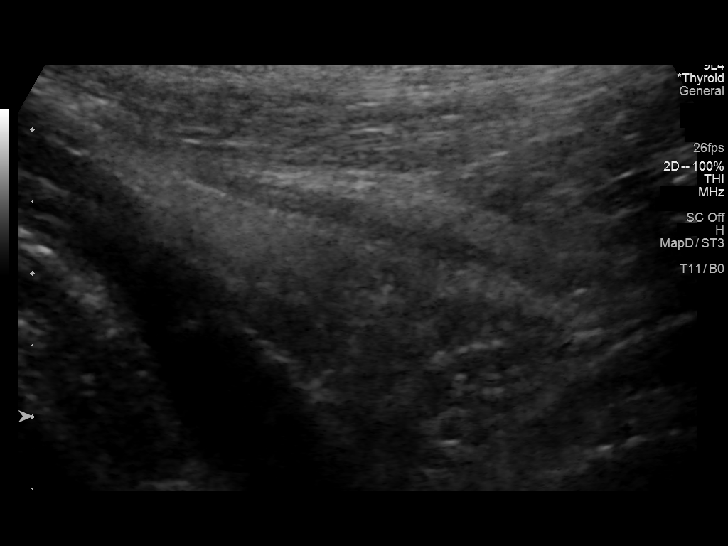
[im 25/38]
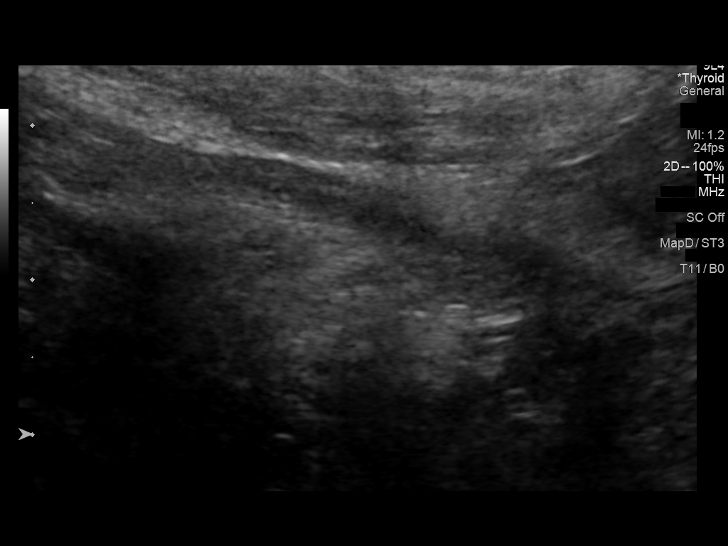
[im 28/38]
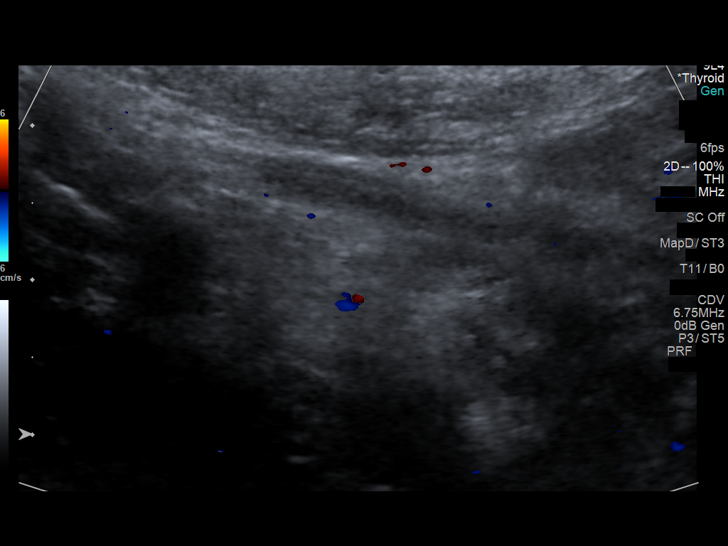
[im 31/38]
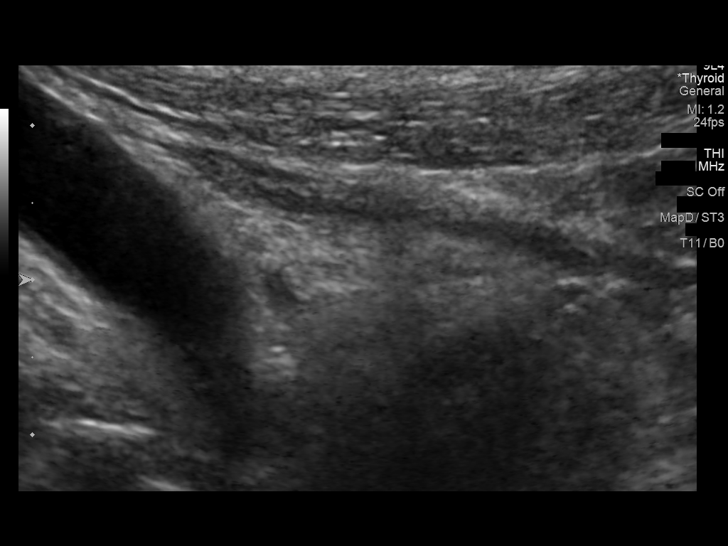
[im 34/38]
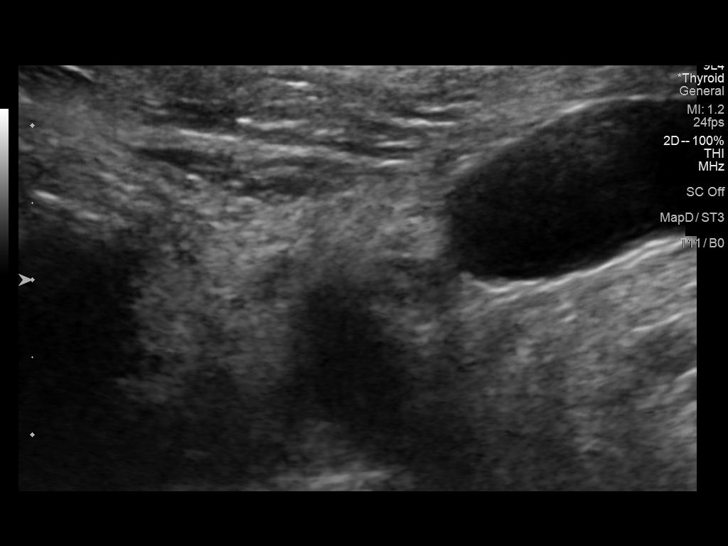
[im 38/38]
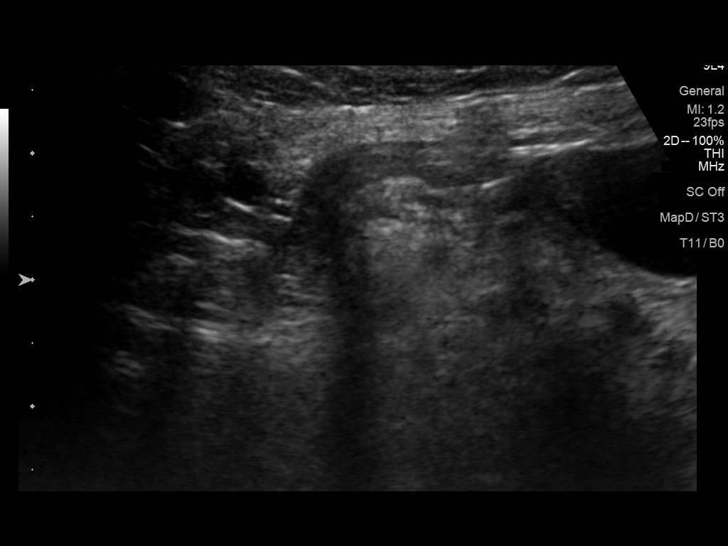

[13 of 25 positions shown; findings below may reference images not displayed]

FINDINGS: Examination is degraded due to patient body habitus and poor
sonographic window.

Parenchymal Echotexture: Moderately heterogenous

Isthmus: Normal in size measuring 0.3 cm in diameter

Right lobe: Slightly diminutive in size measuring 5.1 x 1.3 x 1.3 cm

Left lobe: There is slightly diminutive in size measuring 3.7 x
x 1.2 cm

_________________________________________________________

Estimated total number of nodules >/= 1 cm: 0

Number of spongiform nodules >/=  2 cm not described below (TR1): 0

Number of mixed cystic and solid nodules >/= 1.5 cm not described
below (TR2): 0

_________________________________________________________

No discrete nodules are seen within the thyroid gland.

No definitive nodules are identified adjacent to the thyroid gland
to suggest the presence of a parathyroid adenoma.
IMPRESSION: 1. Moderately heterogeneous and slightly diminutive thyroid gland
without discrete nodule or mass.
2. No definitive nodules are identified adjacent to the thyroid
gland to suggest the presence of a parathyroid adenoma, though
examination is degraded due to patient body habitus and poor
sonographic window. Further evaluation could be performed with
contrast-enhanced neck CT or nuclear medicine parathyroid
scintigraphy as indicated.

## 2018-07-05 ENCOUNTER — Other Ambulatory Visit: Payer: Self-pay | Admitting: Internal Medicine

## 2018-07-05 ENCOUNTER — Encounter: Payer: Self-pay | Admitting: Internal Medicine

## 2018-07-05 DIAGNOSIS — E213 Hyperparathyroidism, unspecified: Secondary | ICD-10-CM

## 2018-07-13 ENCOUNTER — Other Ambulatory Visit: Payer: Self-pay

## 2018-07-13 ENCOUNTER — Ambulatory Visit
Admission: RE | Admit: 2018-07-13 | Discharge: 2018-07-13 | Disposition: A | Discharge status: Home / Self Care | Payer: Medicare Other | Source: Ambulatory Visit | Attending: Internal Medicine | Admitting: Internal Medicine

## 2018-07-13 DIAGNOSIS — Z1231 Encounter for screening mammogram for malignant neoplasm of breast: Secondary | ICD-10-CM

## 2018-07-13 DIAGNOSIS — M810 Age-related osteoporosis without current pathological fracture: Secondary | ICD-10-CM | POA: Diagnosis not present

## 2018-07-13 DIAGNOSIS — Z780 Asymptomatic menopausal state: Secondary | ICD-10-CM | POA: Diagnosis not present

## 2018-07-13 DIAGNOSIS — E213 Hyperparathyroidism, unspecified: Secondary | ICD-10-CM

## 2018-07-13 IMAGING — MG DIGITAL SCREENING BILATERAL MAMMOGRAM WITH TOMO AND CAD
8 series · 8 of 24 positions shown · non-contrast
Comparison: Previous exam(s).

CLINICAL DATA: Screening.

EXAM:
DIGITAL SCREENING BILATERAL MAMMOGRAM WITH TOMO AND CAD

[L CC synth-2D]
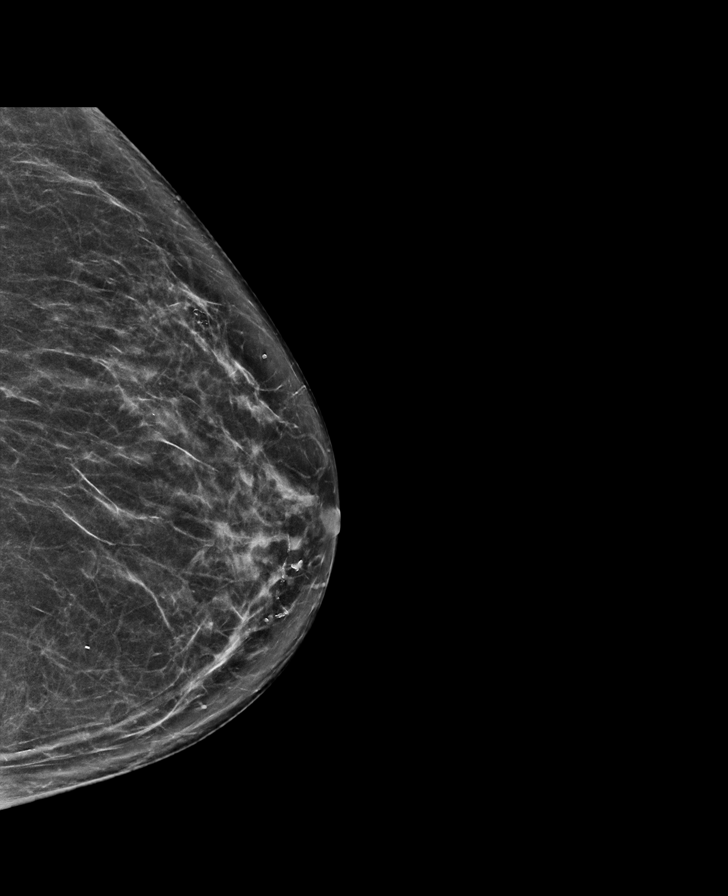

[L MLO synth-2D]
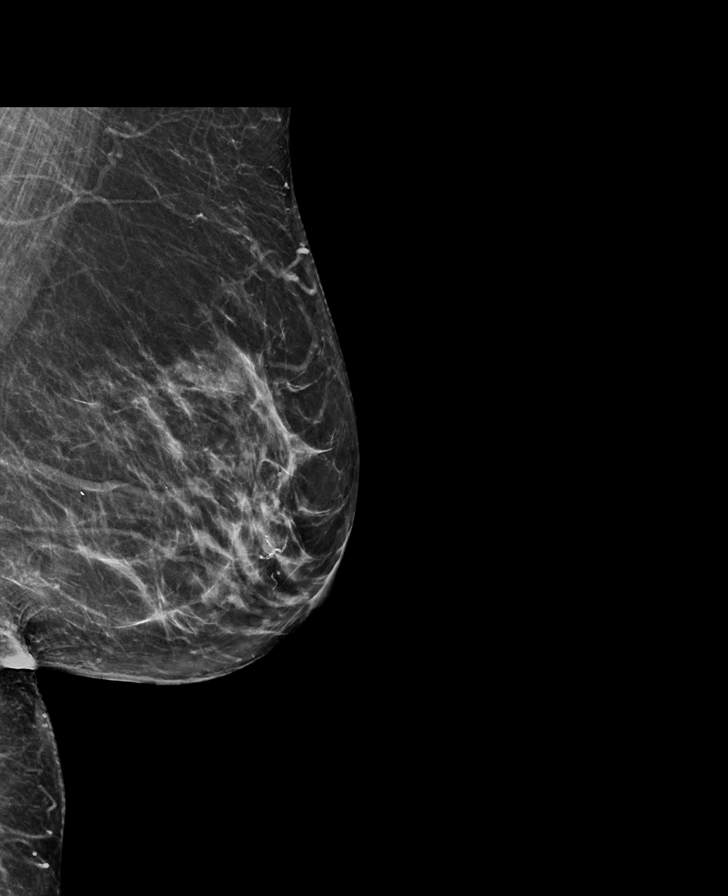

[R MLO synth-2D]
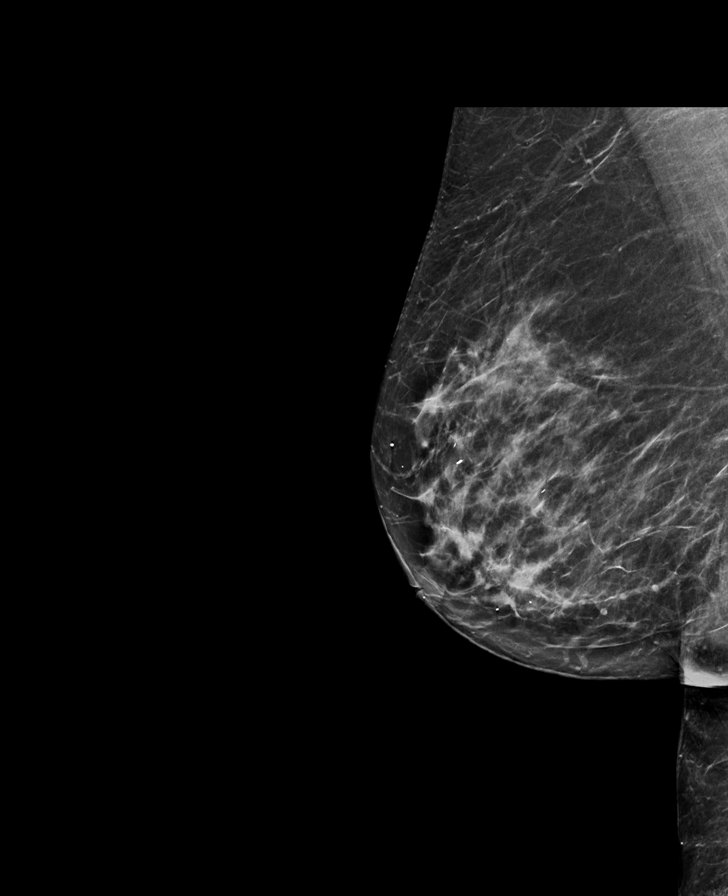

[R CC synth-2D]
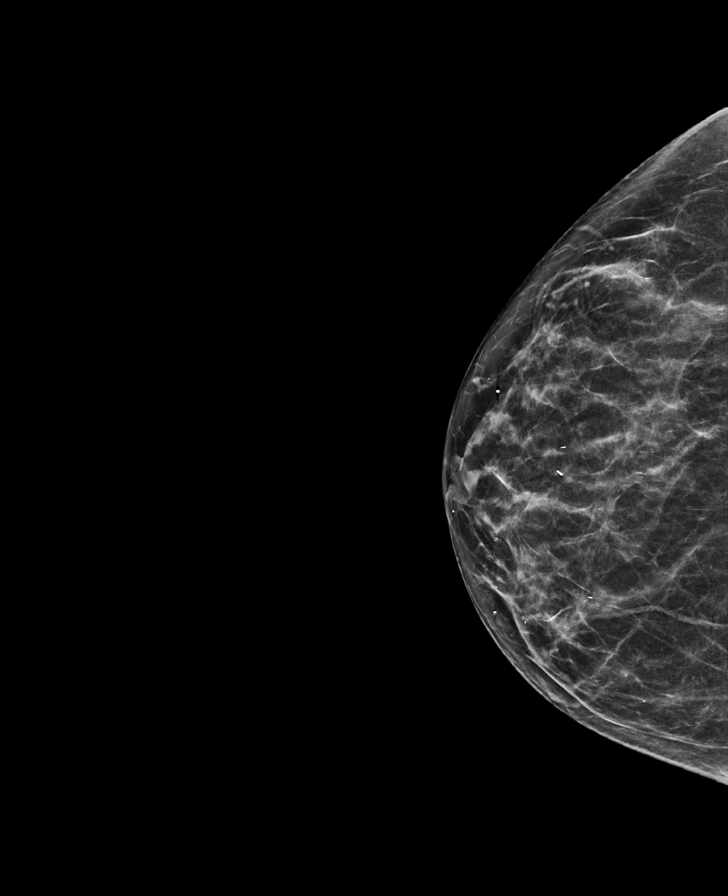

[L CC tomo · tomo slice 35/68.0]
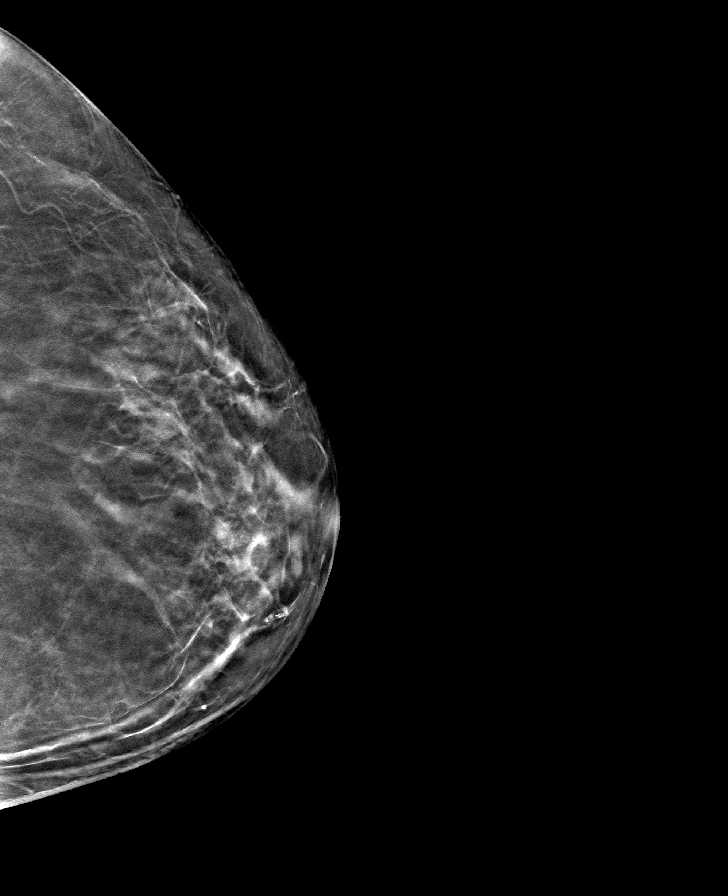

[R MLO tomo · tomo slice 39/76.0]
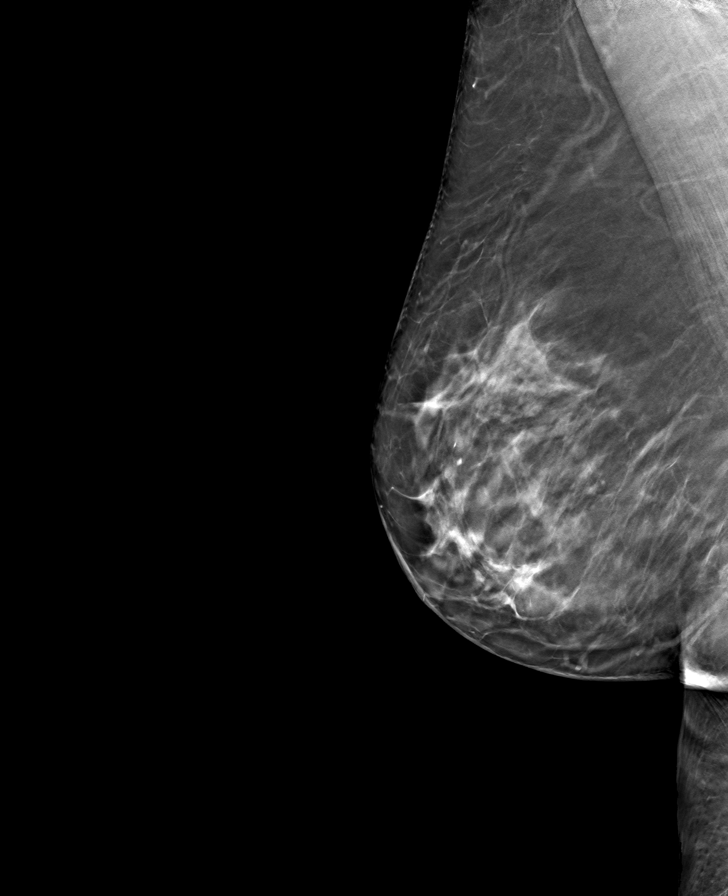

[R CC tomo · tomo slice 31/60.0]
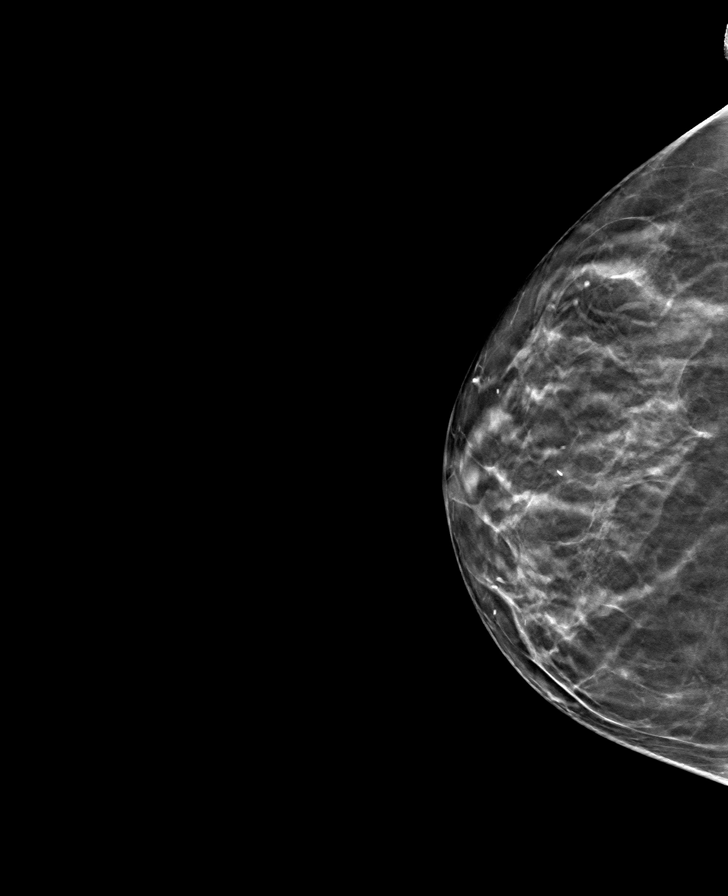

[L MLO tomo · tomo slice 38/75.0]
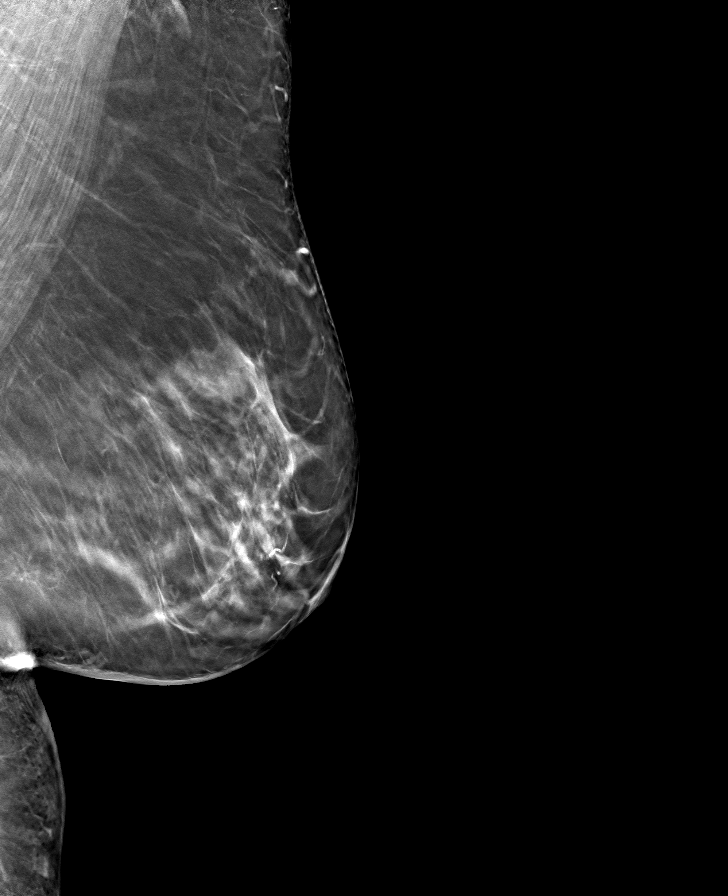

[8 of 24 positions shown; findings below may reference images not displayed]

ACR Breast Density Category c: The breast tissue is heterogeneously
dense, which may obscure small masses.
FINDINGS: There are no findings suspicious for malignancy. Images were
processed with CAD.
IMPRESSION: No mammographic evidence of malignancy. A result letter of this
screening mammogram will be mailed directly to the patient.

RECOMMENDATION:
Screening mammogram in one year. (Code:[5V])

BI-RADS CATEGORY  1: Negative.

## 2018-07-24 ENCOUNTER — Telehealth: Payer: Self-pay | Admitting: Internal Medicine

## 2018-07-24 NOTE — Telephone Encounter (Signed)
Pt aware of results of bone density

## 2018-07-24 NOTE — Telephone Encounter (Signed)
Anna Adams,    Please call the patient and let her know that her bone density continues to show osteoporosis. At this time the initial treatment is surgery of her parathyroid .    Please see if she has heard about the surgery referral I did on her to Dr. Erik Obey per her request.     Thanks    Abby Nena Jordan, MD  [facility] Endocrinology  [facility] [address] [place], [place] [address] Phone: [phone] FAX: [phone]

## 2018-07-28 ENCOUNTER — Other Ambulatory Visit: Payer: Self-pay | Admitting: Internal Medicine

## 2018-08-01 DIAGNOSIS — M810 Age-related osteoporosis without current pathological fracture: Secondary | ICD-10-CM | POA: Diagnosis not present

## 2018-08-01 DIAGNOSIS — E039 Hypothyroidism, unspecified: Secondary | ICD-10-CM | POA: Diagnosis not present

## 2018-08-01 DIAGNOSIS — R7301 Impaired fasting glucose: Secondary | ICD-10-CM | POA: Diagnosis not present

## 2018-08-01 DIAGNOSIS — E890 Postprocedural hypothyroidism: Secondary | ICD-10-CM | POA: Diagnosis not present

## 2018-08-01 DIAGNOSIS — E559 Vitamin D deficiency, unspecified: Secondary | ICD-10-CM | POA: Diagnosis not present

## 2018-08-01 DIAGNOSIS — E210 Primary hyperparathyroidism: Secondary | ICD-10-CM | POA: Diagnosis not present

## 2018-08-01 DIAGNOSIS — I10 Essential (primary) hypertension: Secondary | ICD-10-CM | POA: Diagnosis not present

## 2018-08-08 ENCOUNTER — Ambulatory Visit (INDEPENDENT_AMBULATORY_CARE_PROVIDER_SITE_OTHER): Payer: Medicare Other | Admitting: Podiatry

## 2018-08-08 ENCOUNTER — Other Ambulatory Visit: Payer: Self-pay

## 2018-08-08 ENCOUNTER — Encounter: Payer: Self-pay | Admitting: Podiatry

## 2018-08-08 VITALS — Temp 98.9°F

## 2018-08-08 DIAGNOSIS — B351 Tinea unguium: Secondary | ICD-10-CM | POA: Diagnosis not present

## 2018-08-08 DIAGNOSIS — M79676 Pain in unspecified toe(s): Secondary | ICD-10-CM | POA: Diagnosis not present

## 2018-08-08 NOTE — Progress Notes (Signed)
Complaint:  Visit Type: Patient returns to my office for continued preventative foot care services. Complaint: Patient states" my nails have grown long and thick and become painful to walk and wear shoes" . The patient presents for preventative foot care services. No changes to ROS  Podiatric Exam: Vascular: dorsalis pedis and posterior tibial pulses are palpable bilateral. Capillary return is immediate. Temperature gradient is WNL. Skin turgor WNL  Sensorium: Normal Semmes Weinstein monofilament test. Normal tactile sensation bilaterally. Nail Exam: Pt has thick disfigured discolored nails with subungual debris noted bilateral entire nail hallux through fifth toenails Ulcer Exam: There is no evidence of ulcer or pre-ulcerative changes or infection. Orthopedic Exam: Muscle tone and strength are WNL. No limitations in general ROM. No crepitus or effusions noted. Foot type and digits show no abnormalities. Bony prominences are unremarkable. Skin: No Porokeratosis. No infection or ulcers  Diagnosis:  Onychomycosis, , Pain in right toe, pain in left toes  Treatment & Plan Procedures and Treatment: Consent by patient was obtained for treatment procedures.   Debridement of mycotic and hypertrophic toenails, 1 through 5 bilateral and clearing of subungual debris. No ulceration, no infection noted.  Return Visit-Office Procedure: Patient instructed to return to the office for a follow up visit 3 months for continued evaluation and treatment.    Josselin Gaulin DPM 

## 2018-08-09 DIAGNOSIS — M810 Age-related osteoporosis without current pathological fracture: Secondary | ICD-10-CM | POA: Diagnosis not present

## 2018-08-30 DIAGNOSIS — H25813 Combined forms of age-related cataract, bilateral: Secondary | ICD-10-CM | POA: Diagnosis not present

## 2018-08-30 DIAGNOSIS — H04123 Dry eye syndrome of bilateral lacrimal glands: Secondary | ICD-10-CM | POA: Diagnosis not present

## 2018-08-30 DIAGNOSIS — H40033 Anatomical narrow angle, bilateral: Secondary | ICD-10-CM | POA: Diagnosis not present

## 2018-08-30 DIAGNOSIS — H40053 Ocular hypertension, bilateral: Secondary | ICD-10-CM | POA: Diagnosis not present

## 2018-09-19 ENCOUNTER — Ambulatory Visit: Payer: Medicare Other | Admitting: Internal Medicine

## 2018-09-28 ENCOUNTER — Other Ambulatory Visit: Payer: Self-pay | Admitting: Internal Medicine

## 2018-10-01 ENCOUNTER — Other Ambulatory Visit: Payer: Self-pay | Admitting: Internal Medicine

## 2018-10-15 NOTE — Patient Instructions (Addendum)
  Tests ordered today. Your results will be released to MyChart (or called to you) after review.  If any changes need to be made, you will be notified at that same time.  Flu immunization administered today.    Medications reviewed and updated.  Changes include :  none      Please followup in 6 months   

## 2018-10-15 NOTE — Progress Notes (Signed)
Subjective:    Patient ID: Anna Adams, female    DOB: [birth year], 84 y.o.   MRN: [id number]  HPI The patient is here for follow up.  She is not exercising regularly.    Hypertension: She is taking her medication daily. She is compliant with a low sodium diet.  She denies chest pain, palpitations,  shortness of breath and regular headaches. She does not monitor her blood pressure at home.    Hypothyroidism:  She is taking her medication daily.  She denies any recent changes in energy or weight that are unexplained.   Prediabetes:  She is compliant with a low sugar/carbohydrate diet.  She is not exercising regularly.  GERD:  She is taking her medication daily as prescribed.  She denies any GERD symptoms and feels her GERD is well controlled.   Hyperlipidemia: She is taking her medication daily. She is compliant with a low fat/cholesterol diet. She denies myalgias.   Osteoarthritis:  She takes etodolac daily.  Her pain is controlled.    Had dental pain  - saw dentist and he thought it was more likely trigeminal nerve pain.  It has gotten better and she does not want to do anything now for it.   CTS:  She wears the brace nightly.  She has occ nerve pain from her elbow to her wrist.  The CTS is better.    Her neck or shoulders ?  She has crucnching in her neck.     Medications and allergies reviewed with patient and updated if appropriate.  Patient Active Problem List   Diagnosis Date Noted  . Right carpal tunnel syndrome 04/12/2018  . Hyperparathyroidism ([place]) 04/10/2018  . Diverticulitis 10/12/2017  . Hypercalcemia 10/12/2017  . Prediabetes 10/11/2017  . Squamous cell skin cancer 04/01/2016  . Hiatal hernia, large 09/22/2015  . Pain in lower limb 03/04/2015  . Edema 11/26/2013  . Parotid tumor 03/23/2013  . Dyslipidemia   . Onychomycosis due to dermatophyte 05/05/2012  . Osteoporosis, post-menopausal   . GERD (gastroesophageal reflux disease)   . Benign hypertensive  heart disease without heart failure 04/28/2011  . Osteoarthritis 01/08/2011  . Hypothyroidism 02/07/2009  . Essential hypertension 02/07/2009  . ALLERGIC RHINITIS 02/07/2009  . Intrinsic asthma 02/07/2009    Current Outpatient Medications on File Prior to Visit  Medication Sig Dispense Refill  . amLODipine (NORVASC) 5 MG tablet TAKE 1 TABLET BY MOUTH EVERY DAY 90 tablet 3  . atorvastatin (LIPITOR) 20 MG tablet TAKE 1 TABLET DAILY 90 tablet 1  . b complex vitamins tablet Take 1 tablet by mouth daily.      . cholecalciferol (VITAMIN D) 1000 UNITS tablet Take 1,000 Units by mouth daily.      Marland Kitchen etodolac (LODINE XL) 400 MG 24 hr tablet TAKE 1 TABLET DAILY 90 tablet 1  . famotidine (PEPCID) 20 MG tablet TAKE 1 TABLET BY MOUTH EVERY DAY 90 tablet 1  . hydrocortisone-pramoxine (ANALPRAM HC) 2.5-1 % rectal cream Place 1 application rectally 3 (three) times daily. 30 g 0  . losartan (COZAAR) 100 MG tablet TAKE 1 TABLET BY MOUTH EVERY DAY 90 tablet 3  . meclizine (ANTIVERT) 25 MG tablet Take 1 tablet (25 mg total) by mouth 3 (three) times daily as needed for dizziness. 30 tablet 5  . PROVENTIL HFA 108 (90 Base) MCG/ACT inhaler INHALE 1-2 PUFFS INTO THE LUNGS EVERY 6 (SIX) HOURS AS NEEDED FOR WHEEZING. 6.7 Inhaler 7  . SYNTHROID 112 MCG tablet TAKE 1  TABLET BY MOUTH EVERY DAY 90 tablet 0  . UNABLE TO FIND at bedtime. Osteo-ease    . vitamin E 200 UNIT capsule Take 200 Units by mouth daily.       No current facility-administered medications on file prior to visit.     Past Medical History:  Diagnosis Date  . ALLERGIC RHINITIS   . Anemia   . Asthma   . Asthma   . Cataract   . GERD (gastroesophageal reflux disease)    with HH  . History of renal calculi   . Hypertension   . Hypothyroidism   . Osteoarthritis   . Parotid tumor    L side, s/p resection 1980 and 2007    Past Surgical History:  Procedure Laterality Date  . CARDIOVASCULAR STRESS TEST  07/12/2005   EF 85%  . CERVICAL  DISCECTOMY  1993  . [place]   (L) wrist x's 2  . KNEE ARTHROPLASTY  04/22/2006   DR. WQuillian Quince CAFFREY  . Left knee replacement  2005  . LUMBAR DISC SURGERY    . PAROTID GLAND TUMOR EXCISION  1980, 2007   Left  . right knee replacement  2008  . TONSILLECTOMY AND ADENOIDECTOMY    . TUBAL LIGATION      Social History   Socioeconomic History  . Marital status: Married    Spouse name: Not on file  . Number of children: Not on file  . Years of education: Not on file  . Highest education level: Not on file  Occupational History  . Not on file  Social Needs  . Financial resource strain: Not on file  . Food insecurity    Worry: Not on file    Inability: Not on file  . Transportation needs    Medical: Not on file    Non-medical: Not on file  Tobacco Use  . Smoking status: Former Smoker    Quit date: 09/01/1962    Years since quitting: 56.1  . Smokeless tobacco: Never Used  Substance and Sexual Activity  . Alcohol use: No    Alcohol/week: 0.0 standard drinks  . Drug use: No  . Sexual activity: Not on file  Lifestyle  . Physical activity    Days per week: Not on file    Minutes per session: Not on file  . Stress: Not on file  Relationships  . Social Herbalist on phone: Not on file    Gets together: Not on file    Attends religious service: Not on file    Active member of club or organization: Not on file    Attends meetings of clubs or organizations: Not on file    Relationship status: Not on file  Other Topics Concern  . Not on file  Social History Narrative   married, lives with spouse, retired Therapist, sports    Family History  Problem Relation Age of Onset  . Asthma Mother   . Cancer Mother   . Heart disease Mother   . Hypertension Father   . Heart disease Father   . Hyperlipidemia Father   . Stroke Father   . Mental illness Father   . Stroke Maternal Grandmother   . Heart disease Maternal Grandfather   . Hypertension Paternal  Grandfather     Review of Systems  Constitutional: Negative for chills and fever.  Respiratory: Negative for cough, shortness of breath and wheezing.   Cardiovascular: Positive for leg swelling. Negative  for chest pain and palpitations.  Gastrointestinal: Negative for abdominal pain.  Neurological: Negative for light-headedness and headaches.       Objective:   Vitals:   10/16/18 1356  BP: (!) 156/92  Pulse: 60  Resp: 16  Temp: 98 F (36.7 C)  SpO2: 95%   BP Readings from Last 3 Encounters:  10/16/18 (!) 156/92  04/12/18 136/70  03/21/18 (!) 158/82   Wt Readings from Last 3 Encounters:  10/16/18 171 lb 1.9 oz (77.6 kg)  04/12/18 173 lb 6.4 oz (78.7 kg)  03/21/18 173 lb 6.4 oz (78.7 kg)   Body mass index is 34.56 kg/m.   Physical Exam    Constitutional: Appears well-developed and well-nourished. No distress.  HENT:  Head: Normocephalic and atraumatic.  Neck: Neck supple. No tracheal deviation present. No thyromegaly present.  No cervical lymphadenopathy Cardiovascular: Normal rate, regular rhythm and normal heart sounds.   No murmur heard. No carotid bruit .  1 + b/l LE edema Pulmonary/Chest: Effort normal and breath sounds normal. No respiratory distress. No has no wheezes. No rales.  Skin: Skin is warm and dry. Not diaphoretic.  Psychiatric: Normal mood and affect. Behavior is normal.      Assessment & Plan:    See Problem List for Assessment and Plan of chronic medical problems.

## 2018-10-16 ENCOUNTER — Other Ambulatory Visit (INDEPENDENT_AMBULATORY_CARE_PROVIDER_SITE_OTHER): Payer: Medicare Other

## 2018-10-16 ENCOUNTER — Other Ambulatory Visit: Payer: Self-pay

## 2018-10-16 ENCOUNTER — Encounter: Payer: Self-pay | Admitting: Internal Medicine

## 2018-10-16 ENCOUNTER — Ambulatory Visit (INDEPENDENT_AMBULATORY_CARE_PROVIDER_SITE_OTHER): Payer: Medicare Other | Admitting: Internal Medicine

## 2018-10-16 VITALS — BP 156/92 | HR 60 | Temp 98.0°F | Resp 16 | Ht 59.0 in | Wt 171.1 lb

## 2018-10-16 DIAGNOSIS — I10 Essential (primary) hypertension: Secondary | ICD-10-CM

## 2018-10-16 DIAGNOSIS — R7303 Prediabetes: Secondary | ICD-10-CM

## 2018-10-16 DIAGNOSIS — E039 Hypothyroidism, unspecified: Secondary | ICD-10-CM

## 2018-10-16 DIAGNOSIS — Z23 Encounter for immunization: Secondary | ICD-10-CM | POA: Diagnosis not present

## 2018-10-16 DIAGNOSIS — E785 Hyperlipidemia, unspecified: Secondary | ICD-10-CM

## 2018-10-16 DIAGNOSIS — M1990 Unspecified osteoarthritis, unspecified site: Secondary | ICD-10-CM | POA: Diagnosis not present

## 2018-10-16 DIAGNOSIS — M810 Age-related osteoporosis without current pathological fracture: Secondary | ICD-10-CM | POA: Diagnosis not present

## 2018-10-16 DIAGNOSIS — K219 Gastro-esophageal reflux disease without esophagitis: Secondary | ICD-10-CM

## 2018-10-16 LAB — COMPREHENSIVE METABOLIC PANEL
ALT: 12 U/L (ref 0–35)
AST: 22 U/L (ref 0–37)
Albumin: 4.5 g/dL (ref 3.5–5.2)
Alkaline Phosphatase: 78 U/L (ref 39–117)
BUN: 15 mg/dL (ref 6–23)
CO2: 34 mEq/L — ABNORMAL HIGH (ref 19–32)
Calcium: 10.9 mg/dL — ABNORMAL HIGH (ref 8.4–10.5)
Chloride: 97 mEq/L (ref 96–112)
Creatinine, Ser: 0.63 mg/dL (ref 0.40–1.20)
GFR: 90.32 mL/min (ref >60.00)
Glucose, Bld: 86 mg/dL (ref 70–99)
Potassium: 4.8 mEq/L (ref 3.5–5.1)
Sodium: 137 mEq/L (ref 135–145)
Total Bilirubin: 0.7 mg/dL (ref 0.2–1.2)
Total Protein: 7.2 g/dL (ref 6.0–8.3)

## 2018-10-16 LAB — TSH: TSH: 2.04 u[IU]/mL (ref 0.35–4.50)

## 2018-10-16 LAB — HEMOGLOBIN A1C: Hgb A1c MFr Bld: 5.8 % (ref 4.6–6.5)

## 2018-10-16 LAB — LIPID PANEL
Cholesterol: 176 mg/dL (ref 0–200)
HDL: 81 mg/dL (ref >39.00)
LDL Cholesterol: 80 mg/dL (ref 0–99)
NonHDL: 95.2
Total CHOL/HDL Ratio: 2
Triglycerides: 74 mg/dL (ref 0.0–149.0)
VLDL: 14.8 mg/dL (ref 0.0–40.0)

## 2018-10-16 NOTE — Assessment & Plan Note (Signed)
Check lipid panel, CMP Continue atorvastatin Encourage more regular exercise

## 2018-10-16 NOTE — Assessment & Plan Note (Signed)
Blood pressure elevated here today, but typically better controlled Encouraged her to check at home Continue current medications CMP

## 2018-10-16 NOTE — Assessment & Plan Note (Signed)
GERD controlled Continue daily medication  

## 2018-10-16 NOTE — Assessment & Plan Note (Signed)
Check a1c Low sugar / carb diet Stressed regular exercise   

## 2018-10-16 NOTE — Assessment & Plan Note (Signed)
Taking etodolac daily, which is effective We will continue CMP

## 2018-10-16 NOTE — Assessment & Plan Note (Addendum)
Clinically euthyroid Check tsh  Titrate med dose if needed  

## 2018-10-16 NOTE — Assessment & Plan Note (Signed)
dexa up to date  reclast 07/2018 Managed by Dr Chalmers Cater Not exercising Taking vitamin d

## 2018-10-17 ENCOUNTER — Encounter: Payer: Self-pay | Admitting: Internal Medicine

## 2018-11-10 ENCOUNTER — Other Ambulatory Visit: Payer: Self-pay | Admitting: Internal Medicine

## 2018-11-14 ENCOUNTER — Ambulatory Visit (INDEPENDENT_AMBULATORY_CARE_PROVIDER_SITE_OTHER): Payer: Medicare Other | Admitting: Podiatry

## 2018-11-14 ENCOUNTER — Encounter: Payer: Self-pay | Admitting: Podiatry

## 2018-11-14 ENCOUNTER — Other Ambulatory Visit: Payer: Self-pay

## 2018-11-14 DIAGNOSIS — B351 Tinea unguium: Secondary | ICD-10-CM

## 2018-11-14 DIAGNOSIS — M79676 Pain in unspecified toe(s): Secondary | ICD-10-CM

## 2018-11-14 NOTE — Progress Notes (Signed)
Complaint:  Visit Type: Patient returns to my office for continued preventative foot care services. Complaint: Patient states" my nails have grown long and thick and become painful to walk and wear shoes" . The patient presents for preventative foot care services. No changes to ROS  Podiatric Exam: Vascular: dorsalis pedis  Are weakly  palpable bilateral. Posterior tibial pulses are absent due to swelling  B/L. Capillary return is immediate. Temperature gradient is WNL. Skin turgor WNL  Sensorium: Normal Semmes Weinstein monofilament test. Normal tactile sensation bilaterally. Nail Exam: Pt has thick disfigured discolored nails with subungual debris noted bilateral entire nail hallux through fifth toenails Ulcer Exam: There is no evidence of ulcer or pre-ulcerative changes or infection. Orthopedic Exam: Muscle tone and strength are WNL. No limitations in general ROM. No crepitus or effusions noted. Foot type and digits show no abnormalities. Severe HAV  B/L. Skin: No Porokeratosis. No infection or ulcers  Diagnosis:  Onychomycosis, , Pain in right toe, pain in left toes  Treatment & Plan Procedures and Treatment: Consent by patient was obtained for treatment procedures.   Debridement of mycotic and hypertrophic toenails, 1 through 5 bilateral and clearing of subungual debris. No ulceration, no infection noted.  Return Visit-Office Procedure: Patient instructed to return to the office for a follow up visit 3 months for continued evaluation and treatment.    Gardiner Barefoot DPM

## 2018-12-29 ENCOUNTER — Other Ambulatory Visit: Payer: Self-pay | Admitting: Internal Medicine

## 2019-01-18 ENCOUNTER — Other Ambulatory Visit: Payer: Self-pay | Admitting: Internal Medicine

## 2019-02-02 ENCOUNTER — Encounter: Payer: Self-pay | Admitting: Internal Medicine

## 2019-02-05 MED ORDER — GABAPENTIN 100 MG PO CAPS: 100.0000 mg | ORAL_CAPSULE | Freq: Two times a day (BID) | ORAL | 3 refills | Status: DC

## 2019-02-05 NOTE — Addendum Note (Signed)
Addended by: Binnie Rail on: 02/05/2019 07:33 AM   Modules accepted: Orders

## 2019-02-09 DIAGNOSIS — L905 Scar conditions and fibrosis of skin: Secondary | ICD-10-CM | POA: Diagnosis not present

## 2019-02-09 DIAGNOSIS — L821 Other seborrheic keratosis: Secondary | ICD-10-CM | POA: Diagnosis not present

## 2019-02-09 DIAGNOSIS — Z85828 Personal history of other malignant neoplasm of skin: Secondary | ICD-10-CM | POA: Diagnosis not present

## 2019-02-12 ENCOUNTER — Ambulatory Visit (INDEPENDENT_AMBULATORY_CARE_PROVIDER_SITE_OTHER): Payer: Medicare Other | Admitting: Podiatry

## 2019-02-12 ENCOUNTER — Other Ambulatory Visit: Payer: Self-pay

## 2019-02-12 DIAGNOSIS — B351 Tinea unguium: Secondary | ICD-10-CM | POA: Diagnosis not present

## 2019-02-12 DIAGNOSIS — M79676 Pain in unspecified toe(s): Secondary | ICD-10-CM

## 2019-02-13 ENCOUNTER — Ambulatory Visit: Payer: Medicare Other | Admitting: Podiatry

## 2019-02-14 ENCOUNTER — Ambulatory Visit: Payer: Medicare Other | Admitting: Podiatry

## 2019-02-14 DIAGNOSIS — H25813 Combined forms of age-related cataract, bilateral: Secondary | ICD-10-CM | POA: Diagnosis not present

## 2019-02-14 DIAGNOSIS — H5203 Hypermetropia, bilateral: Secondary | ICD-10-CM | POA: Diagnosis not present

## 2019-02-14 DIAGNOSIS — H524 Presbyopia: Secondary | ICD-10-CM | POA: Diagnosis not present

## 2019-02-14 DIAGNOSIS — H40033 Anatomical narrow angle, bilateral: Secondary | ICD-10-CM | POA: Diagnosis not present

## 2019-02-15 NOTE — Progress Notes (Signed)
   SUBJECTIVE Patient presents to office today complaining of elongated, thickened nails that cause pain while ambulating in shoes. She is unable to trim her own nails. Patient is here for further evaluation and treatment.  Past Medical History:  Diagnosis Date  . ALLERGIC RHINITIS   . Anemia   . Asthma   . Asthma   . Cataract   . GERD (gastroesophageal reflux disease)    with HH  . History of renal calculi   . Hypertension   . Hypothyroidism   . Osteoarthritis   . Parotid tumor    L side, s/p resection 1980 and 2007    OBJECTIVE General Patient is awake, alert, and oriented x 3 and in no acute distress. Derm Skin is dry and supple bilateral. Negative open lesions or macerations. Remaining integument unremarkable. Nails are tender, long, thickened and dystrophic with subungual debris, consistent with onychomycosis, 1-5 bilateral. No signs of infection noted. Vasc  DP and PT pedal pulses palpable bilaterally. Temperature gradient within normal limits.  Neuro Epicritic and protective threshold sensation grossly intact bilaterally.  Musculoskeletal Exam No symptomatic pedal deformities noted bilateral. Muscular strength within normal limits.  ASSESSMENT 1. Onychodystrophic nails 1-5 bilateral with hyperkeratosis of nails.  2. Onychomycosis of nail due to dermatophyte bilateral 3. Pain in foot bilateral  PLAN OF CARE 1. Patient evaluated today.  2. Instructed to maintain good pedal hygiene and foot care.  3. Mechanical debridement of nails 1-5 bilaterally performed using a nail nipper. Filed with dremel without incident.  4. Return to clinic in 3 mos.    Edrick Kins, DPM [facility]  Dr. Edrick Kins, [address]                                        [place], [place] [address]                Office [phone]  Fax [phone]

## 2019-03-07 DIAGNOSIS — H5712 Ocular pain, left eye: Secondary | ICD-10-CM | POA: Diagnosis not present

## 2019-03-07 DIAGNOSIS — H1032 Unspecified acute conjunctivitis, left eye: Secondary | ICD-10-CM | POA: Diagnosis not present

## 2019-03-22 ENCOUNTER — Ambulatory Visit: Payer: Medicare Other | Attending: Internal Medicine

## 2019-03-22 DIAGNOSIS — Z23 Encounter for immunization: Secondary | ICD-10-CM | POA: Insufficient documentation

## 2019-03-22 NOTE — Progress Notes (Signed)
   Covid-19 Vaccination Clinic  Name:  Anna Adams    MRN: [id number] DOB: 1934/12/13  03/22/2019  [name] was observed post Covid-19 immunization for 15 minutes without incidence. She was provided with Vaccine Information Sheet and instruction to access the V-Safe system.   [name] was instructed to call 911 with any severe reactions post vaccine: Marland Kitchen Difficulty breathing  . Swelling of your face and throat  . A fast heartbeat  . A bad rash all over your body  . Dizziness and weakness    Immunizations Administered    Name Date Dose VIS Date Route   Pfizer COVID-19 Vaccine 03/22/2019 12:32 PM 0.3 mL 01/05/2019 Intramuscular   Manufacturer: [place]   Lot: [id number]   [place]: [id number]

## 2019-03-28 ENCOUNTER — Telehealth: Payer: Self-pay | Admitting: Internal Medicine

## 2019-03-28 MED ORDER — SYNTHROID 112 MCG PO TABS: 112.0000 ug | ORAL_TABLET | Freq: Every day | ORAL | 0 refills | Status: DC

## 2019-03-28 NOTE — Telephone Encounter (Signed)
New message:   1.Medication Requested:SYNTHROID 112 MCG tablet  2. Pharmacy (Name, Street, City):[id number] - [place], [address]  3. On Med List: Yes  4. Last Visit with PCP: 10/16/18  5. Next visit date with PCP: 04/20/19   Agent: Please be advised that RX refills may take up to 3 business days. We ask that you follow-up with your pharmacy.

## 2019-03-28 NOTE — Telephone Encounter (Signed)
Rx sent 

## 2019-03-29 ENCOUNTER — Other Ambulatory Visit: Payer: Self-pay

## 2019-03-29 MED ORDER — FAMOTIDINE 20 MG PO TABS: 20.0000 mg | ORAL_TABLET | Freq: Every day | ORAL | 0 refills | Status: DC

## 2019-04-17 ENCOUNTER — Ambulatory Visit: Payer: Medicare Other | Attending: Internal Medicine

## 2019-04-17 ENCOUNTER — Ambulatory Visit: Payer: Medicare Other | Admitting: Internal Medicine

## 2019-04-17 DIAGNOSIS — Z23 Encounter for immunization: Secondary | ICD-10-CM

## 2019-04-19 NOTE — Patient Instructions (Addendum)
  Blood work was ordered.     Medications reviewed and updated.  Changes include :   None   Your prescription(s) have been submitted to your pharmacy. Please take as directed and contact our office if you believe you are having problem(s) with the medication(s).     Please followup in 6 months   

## 2019-04-19 NOTE — Progress Notes (Signed)
Subjective:    Patient ID: Anna Adams, female    DOB: 27-Feb-1934, 85 y.o.   MRN: [id number]  HPI The patient is here for follow up of their chronic medical problems, including hypertension, hypothyroidism, prediabetes, GERD, hyperlipidemia, OA.  She is taking all of her medications as prescribed.    She is exercising minimally.     Her arthritis is still bad.  She thinks the medication helps some.  She continues to have the trigeminal pain.  The pain is primarily on the right side of her face, but she thought she felt a little pain in the left side of her face.  She states this feels like a toothache.  It is intermittent.  She was taking the gabapentin, but she felt like it had affected her memory and she stopped it.  Her memory has improved.   Medications and allergies reviewed with patient and updated if appropriate.  Patient Active Problem List   Diagnosis Date Noted  . Right carpal tunnel syndrome 04/12/2018  . Hyperparathyroidism ([place]) 04/10/2018  . Diverticulitis 10/12/2017  . Prediabetes 10/11/2017  . Squamous cell skin cancer 04/01/2016  . Hiatal hernia, large 09/22/2015  . Pain in lower limb 03/04/2015  . Edema 11/26/2013  . Parotid tumor 03/23/2013  . Dyslipidemia   . Onychomycosis due to dermatophyte 05/05/2012  . Osteoporosis, post-menopausal   . GERD (gastroesophageal reflux disease)   . Benign hypertensive heart disease without heart failure 04/28/2011  . Osteoarthritis 01/08/2011  . Hypothyroidism 02/07/2009  . Essential hypertension 02/07/2009  . ALLERGIC RHINITIS 02/07/2009  . Intrinsic asthma 02/07/2009    Current Outpatient Medications on File Prior to Visit  Medication Sig Dispense Refill  . amLODipine (NORVASC) 5 MG tablet TAKE 1 TABLET BY MOUTH EVERY DAY 90 tablet 3  . atorvastatin (LIPITOR) 20 MG tablet TAKE 1 TABLET DAILY 90 tablet 1  . b complex vitamins tablet Take 1 tablet by mouth daily.      . cholecalciferol (VITAMIN D) 1000 UNITS  tablet Take 400 Units by mouth daily.     Marland Kitchen etodolac (LODINE XL) 400 MG 24 hr tablet TAKE 1 TABLET DAILY 90 tablet 1  . famotidine (PEPCID) 20 MG tablet Take 1 tablet (20 mg total) by mouth daily. Need office visit for more refills. 30 tablet 0  . hydrocortisone-pramoxine (ANALPRAM HC) 2.5-1 % rectal cream Place 1 application rectally 3 (three) times daily. 30 g 0  . latanoprost (XALATAN) 0.005 % ophthalmic solution     . losartan (COZAAR) 100 MG tablet TAKE 1 TABLET BY MOUTH EVERY DAY 90 tablet 3  . meclizine (ANTIVERT) 25 MG tablet Take 1 tablet (25 mg total) by mouth 3 (three) times daily as needed for dizziness. 30 tablet 5  . Pramoxine-HC (HYDROCORTISONE ACE-PRAMOXINE) 2.5-1 % CREA Place rectally.    Marland Kitchen PROVENTIL HFA 108 (90 Base) MCG/ACT inhaler INHALE 1-2 PUFFS INTO THE LUNGS EVERY 6 (SIX) HOURS AS NEEDED FOR WHEEZING. 6.7 Inhaler 7  . SYNTHROID 112 MCG tablet Take 1 tablet (112 mcg total) by mouth daily. Need follow up for more refills. 30 tablet 0  . UNABLE TO FIND at bedtime. Osteo-ease    . vitamin E 200 UNIT capsule Take 200 Units by mouth daily.       No current facility-administered medications on file prior to visit.    Past Medical History:  Diagnosis Date  . ALLERGIC RHINITIS   . Anemia   . Asthma   . Asthma   .  Cataract   . GERD (gastroesophageal reflux disease)    with HH  . History of renal calculi   . Hypertension   . Hypothyroidism   . Osteoarthritis   . Parotid tumor    L side, s/p resection 1980 and 2007    Past Surgical History:  Procedure Laterality Date  . CARDIOVASCULAR STRESS TEST  07/12/2005   EF 85%  . CERVICAL DISCECTOMY  1993  . [place]   (L) wrist x's 2  . KNEE ARTHROPLASTY  04/22/2006   DR. WQuillian Quince CAFFREY  . Left knee replacement  2005  . LUMBAR DISC SURGERY    . PAROTID GLAND TUMOR EXCISION  1980, 2007   Left  . right knee replacement  2008  . TONSILLECTOMY AND ADENOIDECTOMY    . TUBAL LIGATION       Social History   Socioeconomic History  . Marital status: Married    Spouse name: Not on file  . Number of children: Not on file  . Years of education: Not on file  . Highest education level: Not on file  Occupational History  . Not on file  Tobacco Use  . Smoking status: Former Smoker    Quit date: 09/01/1962    Years since quitting: 56.6  . Smokeless tobacco: Never Used  Substance and Sexual Activity  . Alcohol use: No    Alcohol/week: 0.0 standard drinks  . Drug use: No  . Sexual activity: Not on file  Other Topics Concern  . Not on file  Social History Narrative   married, lives with spouse, retired Therapist, sports   Social Determinants of Radio broadcast assistant Strain:   . Difficulty of Paying Living Expenses:   Food Insecurity:   . Worried About Charity fundraiser in the Last Year:   . Arboriculturist in the Last Year:   Transportation Needs:   . Film/video editor (Medical):   Marland Kitchen Lack of Transportation (Non-Medical):   Physical Activity:   . Days of Exercise per Week:   . Minutes of Exercise per Session:   Stress:   . Feeling of Stress :   Social Connections:   . Frequency of Communication with Friends and Family:   . Frequency of Social Gatherings with Friends and Family:   . Attends Religious Services:   . Active Member of Clubs or Organizations:   . Attends Archivist Meetings:   Marland Kitchen Marital Status:     Family History  Problem Relation Age of Onset  . Asthma Mother   . Cancer Mother   . Heart disease Mother   . Hypertension Father   . Heart disease Father   . Hyperlipidemia Father   . Stroke Father   . Mental illness Father   . Stroke Maternal Grandmother   . Heart disease Maternal Grandfather   . Hypertension Paternal Grandfather     Review of Systems  Constitutional: Negative for chills and fever.  Respiratory: Positive for shortness of breath and wheezing (occ). Negative for cough.   Cardiovascular: Positive for leg swelling (L >  R - chronic). Negative for chest pain and palpitations.  Neurological: Positive for headaches (trigeminal nerve pain). Negative for dizziness and light-headedness.       Objective:   Vitals:   04/20/19 1330  BP: 138/76  Pulse: 67  Resp: 16  Temp: 98.1 F (36.7 C)  SpO2: 95%   BP Readings from Last 3 Encounters:  04/20/19 138/76  10/16/18 (!) 156/92  04/12/18 136/70   Wt Readings from Last 3 Encounters:  04/20/19 170 lb (77.1 kg)  10/16/18 171 lb 1.9 oz (77.6 kg)  04/12/18 173 lb 6.4 oz (78.7 kg)   Body mass index is 34.34 kg/m.   Physical Exam    Constitutional: Appears well-developed and well-nourished. No distress.  HENT:  Head: Normocephalic and atraumatic.  Neck: Neck supple. No tracheal deviation present. No thyromegaly present.  No cervical lymphadenopathy.  No parotid swelling bilaterally. Cardiovascular: Normal rate, regular rhythm and normal heart sounds.  2/6 systolic murmur heard. No carotid bruit .  1 + b/l LE edema Pulmonary/Chest: Effort normal and breath sounds normal. No respiratory distress. No has no wheezes. No rales.  Skin: Skin is warm and dry. Not diaphoretic.  Psychiatric: Normal mood and affect. Behavior is normal.      Assessment & Plan:    See Problem List for Assessment and Plan of chronic medical problems.    This visit occurred during the SARS-CoV-2 public health emergency.  Safety protocols were in place, including screening questions prior to the visit, additional usage of staff PPE, and extensive cleaning of exam room while observing appropriate contact time as indicated for disinfecting solutions.

## 2019-04-20 ENCOUNTER — Ambulatory Visit (INDEPENDENT_AMBULATORY_CARE_PROVIDER_SITE_OTHER): Payer: Medicare Other | Admitting: Internal Medicine

## 2019-04-20 ENCOUNTER — Other Ambulatory Visit: Payer: Self-pay | Admitting: Internal Medicine

## 2019-04-20 ENCOUNTER — Encounter: Payer: Self-pay | Admitting: Internal Medicine

## 2019-04-20 ENCOUNTER — Other Ambulatory Visit: Payer: Self-pay

## 2019-04-20 VITALS — BP 138/76 | HR 67 | Temp 98.1°F | Resp 16 | Ht 59.0 in | Wt 170.0 lb

## 2019-04-20 DIAGNOSIS — E785 Hyperlipidemia, unspecified: Secondary | ICD-10-CM | POA: Diagnosis not present

## 2019-04-20 DIAGNOSIS — M810 Age-related osteoporosis without current pathological fracture: Secondary | ICD-10-CM | POA: Diagnosis not present

## 2019-04-20 DIAGNOSIS — R7303 Prediabetes: Secondary | ICD-10-CM | POA: Diagnosis not present

## 2019-04-20 DIAGNOSIS — I10 Essential (primary) hypertension: Secondary | ICD-10-CM

## 2019-04-20 DIAGNOSIS — M1990 Unspecified osteoarthritis, unspecified site: Secondary | ICD-10-CM | POA: Diagnosis not present

## 2019-04-20 DIAGNOSIS — G500 Trigeminal neuralgia: Secondary | ICD-10-CM | POA: Diagnosis not present

## 2019-04-20 DIAGNOSIS — E039 Hypothyroidism, unspecified: Secondary | ICD-10-CM

## 2019-04-20 DIAGNOSIS — K219 Gastro-esophageal reflux disease without esophagitis: Secondary | ICD-10-CM

## 2019-04-20 LAB — LIPID PANEL
Cholesterol: 186 mg/dL (ref 0–200)
HDL: 79.7 mg/dL (ref >39.00)
LDL Cholesterol: 91 mg/dL (ref 0–99)
NonHDL: 106.67
Total CHOL/HDL Ratio: 2
Triglycerides: 76 mg/dL (ref 0.0–149.0)
VLDL: 15.2 mg/dL (ref 0.0–40.0)

## 2019-04-20 LAB — HEMOGLOBIN A1C: Hgb A1c MFr Bld: 5.7 % (ref 4.6–6.5)

## 2019-04-20 LAB — CBC WITH DIFFERENTIAL/PLATELET
Basophils Absolute: 0 10*3/uL (ref 0.0–0.1)
Basophils Relative: 0.6 % (ref 0.0–3.0)
Eosinophils Absolute: 0.1 10*3/uL (ref 0.0–0.7)
Eosinophils Relative: 2.2 % (ref 0.0–5.0)
HCT: 42.6 % (ref 36.0–46.0)
Hemoglobin: 14.2 g/dL (ref 12.0–15.0)
Lymphocytes Relative: 19.3 % (ref 12.0–46.0)
Lymphs Abs: 1.2 10*3/uL (ref 0.7–4.0)
MCHC: 33.3 g/dL (ref 30.0–36.0)
MCV: 88.8 fl (ref 78.0–100.0)
Monocytes Absolute: 0.5 10*3/uL (ref 0.1–1.0)
Monocytes Relative: 8.5 % (ref 3.0–12.0)
Neutro Abs: 4.2 10*3/uL (ref 1.4–7.7)
Neutrophils Relative %: 69.4 % (ref 43.0–77.0)
Platelets: 189 10*3/uL (ref 150.0–400.0)
RBC: 4.79 Mil/uL (ref 3.87–5.11)
RDW: 13.6 % (ref 11.5–15.5)
WBC: 6 10*3/uL (ref 4.0–10.5)

## 2019-04-20 LAB — COMPREHENSIVE METABOLIC PANEL
ALT: 14 U/L (ref 0–35)
AST: 27 U/L (ref 0–37)
Albumin: 4.5 g/dL (ref 3.5–5.2)
Alkaline Phosphatase: 78 U/L (ref 39–117)
BUN: 14 mg/dL (ref 6–23)
CO2: 32 mEq/L (ref 19–32)
Calcium: 10.4 mg/dL (ref 8.4–10.5)
Chloride: 99 mEq/L (ref 96–112)
Creatinine, Ser: 0.65 mg/dL (ref 0.40–1.20)
GFR: 87.01 mL/min (ref >60.00)
Glucose, Bld: 92 mg/dL (ref 70–99)
Potassium: 4.5 mEq/L (ref 3.5–5.1)
Sodium: 135 mEq/L (ref 135–145)
Total Bilirubin: 0.6 mg/dL (ref 0.2–1.2)
Total Protein: 7.2 g/dL (ref 6.0–8.3)

## 2019-04-20 LAB — TSH: TSH: 2.33 u[IU]/mL (ref 0.35–4.50)

## 2019-04-20 MED ORDER — ETODOLAC ER 400 MG PO TB24: 400.0000 mg | ORAL_TABLET | Freq: Every day | ORAL | 1 refills | Status: DC

## 2019-04-20 MED ORDER — FAMOTIDINE 20 MG PO TABS: 20.0000 mg | ORAL_TABLET | Freq: Every day | ORAL | 1 refills | Status: DC

## 2019-04-20 MED ORDER — ATORVASTATIN CALCIUM 20 MG PO TABS: 20.0000 mg | ORAL_TABLET | Freq: Every day | ORAL | 1 refills | Status: DC

## 2019-04-20 MED ORDER — CARBAMAZEPINE ER 100 MG PO CP12: 100.0000 mg | ORAL_CAPSULE | Freq: Two times a day (BID) | ORAL | 3 refills | Status: DC

## 2019-04-20 NOTE — Assessment & Plan Note (Signed)
Chronic Check a1c Low sugar / carb diet Stressed regular exercise, weight loss

## 2019-04-20 NOTE — Assessment & Plan Note (Signed)
Chronic Check lipid panel  Continue daily statin Regular exercise and healthy diet encouraged  

## 2019-04-20 NOTE — Assessment & Plan Note (Addendum)
Chronic Taking etodolac daily Medication helps Continue  cmp

## 2019-04-20 NOTE — Assessment & Plan Note (Signed)
Chronic  Clinically euthyroid Check tsh  Titrate med dose if needed  

## 2019-04-20 NOTE — Assessment & Plan Note (Addendum)
Diagnosed by oral surgeon.  No evaluation to date Right side of jaw and left side of face in past few weeks-unsure if it is truly on the left side or not Feels like a toothache, intermittent Gabapentin - caused some memory issues but helped minimally-discontinued Deferred neuro eval deferred MRI Discussed the importance of evaluating this further to make sure there is not a concerning cause Trial of carbamazepine 100 mg BID - reviewed side effects  Advised if pain continues we need to evaluate for cause of pain and have her see neuro

## 2019-04-20 NOTE — Assessment & Plan Note (Signed)
Managed by Dr. Balan 

## 2019-04-20 NOTE — Assessment & Plan Note (Signed)
Chronic BP well controlled Current regimen effective and well tolerated Continue current medications at current doses cmp  

## 2019-04-20 NOTE — Assessment & Plan Note (Signed)
Chronic GERD controlled Continue daily medication  

## 2019-04-21 ENCOUNTER — Other Ambulatory Visit: Payer: Self-pay | Admitting: Internal Medicine

## 2019-04-21 ENCOUNTER — Encounter: Payer: Self-pay | Admitting: Internal Medicine

## 2019-04-24 ENCOUNTER — Other Ambulatory Visit: Payer: Self-pay | Admitting: Internal Medicine

## 2019-04-30 ENCOUNTER — Other Ambulatory Visit: Payer: Self-pay | Admitting: Internal Medicine

## 2019-05-01 ENCOUNTER — Other Ambulatory Visit: Payer: Self-pay | Admitting: Internal Medicine

## 2019-05-02 ENCOUNTER — Other Ambulatory Visit: Payer: Self-pay | Admitting: Internal Medicine

## 2019-05-02 ENCOUNTER — Telehealth: Payer: Self-pay | Admitting: Internal Medicine

## 2019-05-02 NOTE — Telephone Encounter (Signed)
Med list updated

## 2019-05-02 NOTE — Telephone Encounter (Signed)
LVM for pt to call back.

## 2019-05-02 NOTE — Telephone Encounter (Signed)
Please call her and see if she has any improvement in the pain with the carbamazepine?  This has the potential to lower the effective dose of her statin and we may need to adjust that .... we can do this but wanted to see if the carbamazepine was helping

## 2019-05-02 NOTE — Telephone Encounter (Signed)
Pt states that this medication did not help much and she was worried about it affecting her memory, so she stopped taking it.

## 2019-05-02 NOTE — Addendum Note (Signed)
Addended by: Binnie Rail on: 05/02/2019 05:47 PM   Modules accepted: Orders

## 2019-05-14 ENCOUNTER — Other Ambulatory Visit: Payer: Self-pay

## 2019-05-14 ENCOUNTER — Ambulatory Visit (INDEPENDENT_AMBULATORY_CARE_PROVIDER_SITE_OTHER): Payer: Medicare Other | Admitting: Podiatry

## 2019-05-14 ENCOUNTER — Encounter: Payer: Self-pay | Admitting: Podiatry

## 2019-05-14 DIAGNOSIS — M79676 Pain in unspecified toe(s): Secondary | ICD-10-CM

## 2019-05-14 DIAGNOSIS — B351 Tinea unguium: Secondary | ICD-10-CM

## 2019-05-16 NOTE — Progress Notes (Signed)
   SUBJECTIVE Patient presents to office today complaining of elongated, thickened nails that cause pain while ambulating in shoes. She is unable to trim her own nails. Patient is here for further evaluation and treatment.  Past Medical History:  Diagnosis Date  . ALLERGIC RHINITIS   . Anemia   . Asthma   . Asthma   . Cataract   . GERD (gastroesophageal reflux disease)    with HH  . History of renal calculi   . Hypertension   . Hypothyroidism   . Osteoarthritis   . Parotid tumor    L side, s/p resection 1980 and 2007    OBJECTIVE General Patient is awake, alert, and oriented x 3 and in no acute distress. Derm Skin is dry and supple bilateral. Negative open lesions or macerations. Remaining integument unremarkable. Nails are tender, long, thickened and dystrophic with subungual debris, consistent with onychomycosis, 1-5 bilateral. No signs of infection noted. Vasc  DP and PT pedal pulses palpable bilaterally. Temperature gradient within normal limits.  Neuro Epicritic and protective threshold sensation grossly intact bilaterally.  Musculoskeletal Exam No symptomatic pedal deformities noted bilateral. Muscular strength within normal limits.  ASSESSMENT 1. Onychodystrophic nails 1-5 bilateral with hyperkeratosis of nails.  2. Onychomycosis of nail due to dermatophyte bilateral 3. Pain in foot bilateral  PLAN OF CARE 1. Patient evaluated today.  2. Instructed to maintain good pedal hygiene and foot care.  3. Mechanical debridement of nails 1-5 bilaterally performed using a nail nipper. Filed with dremel without incident.  4. Return to clinic in 3 mos.    Edrick Kins, DPM [facility]  Dr. Edrick Kins, [place]                                        [place], [place] [address]                Office [phone]  Fax [phone]

## 2019-07-11 ENCOUNTER — Ambulatory Visit: Payer: Self-pay

## 2019-07-11 ENCOUNTER — Ambulatory Visit (INDEPENDENT_AMBULATORY_CARE_PROVIDER_SITE_OTHER): Payer: Medicare Other | Admitting: Orthopedic Surgery

## 2019-07-11 ENCOUNTER — Encounter: Payer: Self-pay | Admitting: Orthopedic Surgery

## 2019-07-11 DIAGNOSIS — M25511 Pain in right shoulder: Secondary | ICD-10-CM | POA: Diagnosis not present

## 2019-07-11 DIAGNOSIS — M19012 Primary osteoarthritis, left shoulder: Secondary | ICD-10-CM

## 2019-07-11 DIAGNOSIS — M19011 Primary osteoarthritis, right shoulder: Secondary | ICD-10-CM

## 2019-07-11 DIAGNOSIS — M25512 Pain in left shoulder: Secondary | ICD-10-CM

## 2019-07-11 DIAGNOSIS — G8929 Other chronic pain: Secondary | ICD-10-CM | POA: Diagnosis not present

## 2019-07-11 DIAGNOSIS — E210 Primary hyperparathyroidism: Secondary | ICD-10-CM | POA: Diagnosis not present

## 2019-07-11 MED ORDER — LIDOCAINE HCL 1 % IJ SOLN
5.0000 mL | INTRAMUSCULAR | Status: AC | PRN
  Administered 2019-07-11: 5 mL

## 2019-07-11 MED ORDER — BUPIVACAINE HCL 0.5 % IJ SOLN
9.0000 mL | INTRAMUSCULAR | Status: AC | PRN
  Administered 2019-07-11: 9 mL via INTRA_ARTICULAR

## 2019-07-11 MED ORDER — METHYLPREDNISOLONE ACETATE 40 MG/ML IJ SUSP
40.0000 mg | INTRAMUSCULAR | Status: AC | PRN
  Administered 2019-07-11: 40 mg via INTRA_ARTICULAR

## 2019-07-11 NOTE — Progress Notes (Signed)
Office Visit Note   Patient: Anna Adams           Date of Birth: Nov 27, 1934           MRN: [id number] Visit Date: 07/11/2019 Requested by: Binnie Rail, MD [place],  [place] [address] PCP: Binnie Rail, MD  Subjective: Chief Complaint  Patient presents with  . Right Shoulder - Pain    HPI: Anna Adams is a 85 year old patient with bilateral shoulder pain right worse than left.  Denies any history of injury.  She states that she has "had bad shoulders for years".'s been worse over the last 2 months.  She did have an acute episode of level 12 out of 10 pain in the right shoulder 2 weeks ago which is now improved.  No history of gout.  She does have a history of cervical spine fusion 1 level many years ago.  She is right-hand dominant.  The pain does not wake her from sleep.  Denies any radicular arm pain but has occasional numbness and tingling in her hands.  She has used Tylenol and heat with some relief.              ROS: All systems reviewed are negative as they relate to the chief complaint within the history of present illness.  Patient denies  fevers or chills.   Assessment & Plan: Visit Diagnoses:  1. Chronic pain of both shoulders     Plan: Impression is bilateral shoulder arthritis right worse than left.  This may be part of her pain generation.  Could also be coming from the neck but there is no real clear-cut radicular symptoms at this time.  Performed today divided intra-articular glenohumeral joint injection.  We will see how that does for her in terms of helping with the pain.  Follow-up when her symptoms recur.  We may need to do more of a neck work-up at that time but her range of motion currently and strength are good bilateral upper extremities.  Follow-Up Instructions: Return if symptoms worsen or fail to improve.   Orders:  Orders Placed This Encounter  Procedures  . XR Shoulder Right  . XR Shoulder Left   No orders of the defined types were  placed in this encounter.     Procedures: Large Joint Inj: R glenohumeral on 07/11/2019 9:15 PM Indications: diagnostic evaluation and pain Details: 18 G 1.5 in needle, posterior approach  Arthrogram: No  Medications: 9 mL bupivacaine 0.5 %; 40 mg methylPREDNISolone acetate 40 MG/ML; 5 mL lidocaine 1 % Outcome: tolerated well, no immediate complications Procedure, treatment alternatives, risks and benefits explained, specific risks discussed. Consent was given by the patient. Immediately prior to procedure a time out was called to verify the correct patient, procedure, equipment, support staff and site/side marked as required. Patient was prepped and draped in the usual sterile fashion.       Clinical Data: No additional findings.  Objective: Vital Signs: There were no vitals taken for this visit.  Physical Exam:   Constitutional: Patient appears well-developed HEENT:  Head: Normocephalic Eyes:EOM are normal Neck: Normal range of motion Cardiovascular: Normal rate Pulmonary/chest: Effort normal Neurologic: Patient is alert Skin: Skin is warm Psychiatric: Patient has normal mood and affect    Ortho Exam: Ortho exam demonstrates good cervical spine range of motion with flexion extension and rotation.  5 out of 5 grip EPL FPL interosseous wrist flexion extension bicep triceps and deltoid strength.  She  has palpable radial pulses bilaterally.  She has external rotation of 15 degrees of abduction to about 50 degrees bilaterally.  Pretty reasonable rotator cuff strength infraspinatus supraspinatus and subscap muscle testing.  No definite paresthesias C5-T1 bilaterally.  Not much in the [place] of coarse grinding or crepitus with active or passive range of motion of either shoulder.  She does have forward flexion abduction both above 90 degrees.  Specialty Comments:  No specialty comments available.  Imaging: XR Shoulder Left  Result Date: 07/11/2019 AP axillary lateral and outlet  radiographs left shoulder reviewed.  Moderate glenohumeral arthritis is present with maintenance of the acromiohumeral distance.  No fracture or dislocation is present.  Visualized lung fields clear.  XR Shoulder Right  Result Date: 07/11/2019 AP outlet axillary right shoulder reviewed.  Moderately severe glenohumeral osteoarthritis is present.  Visualized lung fields intact.  No acute fracture.  Acromiohumeral distance is maintained.    PMFS History: Patient Active Problem List   Diagnosis Date Noted  . Trigeminal neuralgia 04/20/2019  . Right carpal tunnel syndrome 04/12/2018  . Hyperparathyroidism ([place]) 04/10/2018  . Diverticulitis 10/12/2017  . Prediabetes 10/11/2017  . Squamous cell skin cancer 04/01/2016  . Hiatal hernia, large 09/22/2015  . Pain in lower limb 03/04/2015  . Edema 11/26/2013  . Parotid tumor 03/23/2013  . Dyslipidemia   . Onychomycosis due to dermatophyte 05/05/2012  . Osteoporosis, post-menopausal   . GERD (gastroesophageal reflux disease)   . Benign hypertensive heart disease without heart failure 04/28/2011  . Osteoarthritis 01/08/2011  . Hypothyroidism 02/07/2009  . Essential hypertension 02/07/2009  . ALLERGIC RHINITIS 02/07/2009  . Intrinsic asthma 02/07/2009   Past Medical History:  Diagnosis Date  . ALLERGIC RHINITIS   . Anemia   . Asthma   . Asthma   . Cataract   . GERD (gastroesophageal reflux disease)    with HH  . History of renal calculi   . Hypertension   . Hypothyroidism   . Osteoarthritis   . Parotid tumor    L side, s/p resection 1980 and 2007    Family History  Problem Relation Age of Onset  . Asthma Mother   . Cancer Mother   . Heart disease Mother   . Hypertension Father   . Heart disease Father   . Hyperlipidemia Father   . Stroke Father   . Mental illness Father   . Stroke Maternal Grandmother   . Heart disease Maternal Grandfather   . Hypertension Paternal Grandfather     Past Surgical History:  Procedure  Laterality Date  . CARDIOVASCULAR STRESS TEST  07/12/2005   EF 85%  . CERVICAL DISCECTOMY  1993  . [place]   (L) wrist x's 2  . KNEE ARTHROPLASTY  04/22/2006   DR. WQuillian Quince CAFFREY  . Left knee replacement  2005  . LUMBAR DISC SURGERY    . PAROTID GLAND TUMOR EXCISION  1980, 2007   Left  . right knee replacement  2008  . TONSILLECTOMY AND ADENOIDECTOMY    . TUBAL LIGATION     Social History   Occupational History  . Not on file  Tobacco Use  . Smoking status: Former Smoker    Quit date: 09/01/1962    Years since quitting: 56.8  . Smokeless tobacco: Never Used  Substance and Sexual Activity  . Alcohol use: No    Alcohol/week: 0.0 standard drinks  . Drug use: No  . Sexual activity: Not on file

## 2019-07-13 ENCOUNTER — Other Ambulatory Visit: Payer: Self-pay | Admitting: Endocrinology

## 2019-07-13 DIAGNOSIS — M810 Age-related osteoporosis without current pathological fracture: Secondary | ICD-10-CM

## 2019-07-17 ENCOUNTER — Other Ambulatory Visit: Payer: Self-pay | Admitting: Internal Medicine

## 2019-08-05 ENCOUNTER — Other Ambulatory Visit: Payer: Self-pay | Admitting: Internal Medicine

## 2019-08-13 ENCOUNTER — Ambulatory Visit: Payer: Medicare Other | Admitting: Podiatry

## 2019-08-14 DIAGNOSIS — M810 Age-related osteoporosis without current pathological fracture: Secondary | ICD-10-CM | POA: Diagnosis not present

## 2019-09-05 ENCOUNTER — Other Ambulatory Visit: Payer: Self-pay

## 2019-09-05 ENCOUNTER — Ambulatory Visit (INDEPENDENT_AMBULATORY_CARE_PROVIDER_SITE_OTHER): Payer: Medicare Other | Admitting: Podiatry

## 2019-09-05 DIAGNOSIS — B351 Tinea unguium: Secondary | ICD-10-CM | POA: Diagnosis not present

## 2019-09-05 DIAGNOSIS — L989 Disorder of the skin and subcutaneous tissue, unspecified: Secondary | ICD-10-CM

## 2019-09-05 DIAGNOSIS — M79676 Pain in unspecified toe(s): Secondary | ICD-10-CM

## 2019-09-05 DIAGNOSIS — M79672 Pain in left foot: Secondary | ICD-10-CM

## 2019-09-05 DIAGNOSIS — M79671 Pain in right foot: Secondary | ICD-10-CM

## 2019-09-05 NOTE — Progress Notes (Signed)
   SUBJECTIVE Patient presents to office today complaining of elongated, thickened nails that cause pain while ambulating in shoes. She is unable to trim her own nails. Patient is here for further evaluation and treatment.  Past Medical History:  Diagnosis Date  . ALLERGIC RHINITIS   . Anemia   . Asthma   . Asthma   . Cataract   . GERD (gastroesophageal reflux disease)    with HH  . History of renal calculi   . Hypertension   . Hypothyroidism   . Osteoarthritis   . Parotid tumor    L side, s/p resection 1980 and 2007    OBJECTIVE General Patient is awake, alert, and oriented x 3 and in no acute distress. Derm Skin is dry and supple bilateral. Negative open lesions or macerations. Remaining integument unremarkable. Nails are tender, long, thickened and dystrophic with subungual debris, consistent with onychomycosis, 1-5 bilateral. No signs of infection noted.  There is some diffuse hyperkeratotic skin tissue noted to the distal tips of the toes bilateral Vasc  DP and PT pedal pulses palpable bilaterally. Temperature gradient within normal limits.  Neuro Epicritic and protective threshold sensation grossly intact bilaterally.  Musculoskeletal Exam No symptomatic pedal deformities noted bilateral. Muscular strength within normal limits.  ASSESSMENT 1. Onychodystrophic nails 1-5 bilateral with hyperkeratosis of nails.  2. Onychomycosis of nail due to dermatophyte bilateral 3.  Preulcerative callus lesions bilateral feet  4.  Pain in foot bilateral  PLAN OF CARE 1. Patient evaluated today.  2. Instructed to maintain good pedal hygiene and foot care.  3. Mechanical debridement of nails 1-5 bilaterally performed using a nail nipper. Filed with dremel without incident.  4.  Excisional debridement of the hyperkeratotic preulcerative callus lesions was performed using a tissue nipper without incident or bleeding  5.  Return to clinic in 3 mos.    Terianna Peggs M. Teruko Joswick, DPM [facility]  Dr. Cherrish Vitali M. Tahjay Binion, DPM    [address]                                        [place], [place] [address]                Office [phone]  Fax [phone]     

## 2019-09-26 ENCOUNTER — Other Ambulatory Visit: Payer: Self-pay | Admitting: Internal Medicine

## 2019-10-13 ENCOUNTER — Other Ambulatory Visit: Payer: Self-pay | Admitting: Internal Medicine

## 2019-10-15 ENCOUNTER — Other Ambulatory Visit: Payer: Self-pay | Admitting: Internal Medicine

## 2019-10-22 NOTE — Patient Instructions (Addendum)
  Flu immunization administered today.    A prescription for a walker was given.   Medications reviewed and updated.  Changes include :   none   A referral was ordered for neurology.      Someone from their office will call you to schedule an appointment.    Please followup in 6 months    [place] [facility]  [place], [place]  [facility] [place], [place], [place], [address] [phone] Office [phone] [facility]            [place], [place]

## 2019-10-22 NOTE — Progress Notes (Signed)
Subjective:    Patient ID: Anna Adams, female    DOB: [birth year], 85 y.o.   MRN: [id number]  HPI The patient is here for follow up of their chronic medical problems, including htn, hypothyroidism, prediabetes, GERD, hyperlipidemia, OA, right sided trigeminal pain  She is not exercising regularly.    She still has arthritis pain and trigeminal pain on the right side of her face. The trigeminal did go away but has come back.   She is still grieving her husbands death - he died in 06/17/22. She would ideally like to talk to a therapist.   Medications and allergies reviewed with patient and updated if appropriate.  Patient Active Problem List   Diagnosis Date Noted  . Poor balance 10/23/2019  . Trigeminal neuralgia 04/20/2019  . Right carpal tunnel syndrome 04/12/2018  . Hyperparathyroidism ([place]) 04/10/2018  . Diverticulitis 10/12/2017  . Prediabetes 10/11/2017  . Squamous cell skin cancer 04/01/2016  . Hiatal hernia, large 09/22/2015  . Pain in lower limb 03/04/2015  . Edema 11/26/2013  . Parotid tumor 03/23/2013  . Dyslipidemia   . Onychomycosis due to dermatophyte 05/05/2012  . Osteoporosis, post-menopausal   . GERD (gastroesophageal reflux disease)   . Benign hypertensive heart disease without heart failure 04/28/2011  . Osteoarthritis 01/08/2011  . Hypothyroidism 02/07/2009  . Essential hypertension 02/07/2009  . ALLERGIC RHINITIS 02/07/2009  . Intrinsic asthma 02/07/2009    Current Outpatient Medications on File Prior to Visit  Medication Sig Dispense Refill  . amLODipine (NORVASC) 5 MG tablet Take 1 tablet (5 mg total) by mouth daily. Follow-up appt due in Sept must see provider for future refills 30 tablet 0  . atorvastatin (LIPITOR) 20 MG tablet TAKE 1 TABLET DAILY. 90 tablet 1  . b complex vitamins tablet Take 1 tablet by mouth daily.      . cholecalciferol (VITAMIN D) 1000 UNITS tablet Take 400 Units by mouth daily.     Marland Kitchen etodolac (LODINE XL) 400 MG 24 hr  tablet TAKE 1 TABLET DAILY 90 tablet 1  . famotidine (PEPCID) 20 MG tablet TAKE 1 TABLET BY MOUTH EVERY DAY 90 tablet 1  . hydrocortisone-pramoxine (ANALPRAM HC) 2.5-1 % rectal cream Place 1 application rectally 3 (three) times daily. 30 g 0  . latanoprost (XALATAN) 0.005 % ophthalmic solution     . losartan (COZAAR) 100 MG tablet TAKE 1 TABLET BY MOUTH EVERY DAY 90 tablet 3  . meclizine (ANTIVERT) 25 MG tablet Take 1 tablet (25 mg total) by mouth 3 (three) times daily as needed for dizziness. 30 tablet 5  . Pramoxine-HC (HYDROCORTISONE ACE-PRAMOXINE) 2.5-1 % CREA Place rectally.    Marland Kitchen PROVENTIL HFA 108 (90 Base) MCG/ACT inhaler INHALE 1-2 PUFFS INTO THE LUNGS EVERY 6 (SIX) HOURS AS NEEDED FOR WHEEZING. 6.7 Inhaler 7  . SYNTHROID 112 MCG tablet TAKE 1 TABLET BY MOUTH EVERY DAY 90 tablet 1  . UNABLE TO FIND at bedtime. Osteo-ease    . vitamin E 200 UNIT capsule Take 200 Units by mouth daily.       No current facility-administered medications on file prior to visit.    Past Medical History:  Diagnosis Date  . ALLERGIC RHINITIS   . Anemia   . Asthma   . Asthma   . Cataract   . GERD (gastroesophageal reflux disease)    with HH  . History of renal calculi   . Hypertension   . Hypothyroidism   . Osteoarthritis   . Parotid tumor  L side, s/p resection 1980 and 2007    Past Surgical History:  Procedure Laterality Date  . CARDIOVASCULAR STRESS TEST  07/12/2005   EF 85%  . CERVICAL DISCECTOMY  1993  . [place]   (L) wrist x's 2  . KNEE ARTHROPLASTY  04/22/2006   DR. WQuillian Quince CAFFREY  . Left knee replacement  2005  . LUMBAR DISC SURGERY    . PAROTID GLAND TUMOR EXCISION  1980, 2007   Left  . right knee replacement  2008  . TONSILLECTOMY AND ADENOIDECTOMY    . TUBAL LIGATION      Social History   Socioeconomic History  . Marital status: Married    Spouse name: Not on file  . Number of children: Not on file  . Years of education: Not on file  .  Highest education level: Not on file  Occupational History  . Not on file  Tobacco Use  . Smoking status: Former Smoker    Quit date: 09/01/1962    Years since quitting: 58.1  . Smokeless tobacco: Never Used  Substance and Sexual Activity  . Alcohol use: No    Alcohol/week: 0.0 standard drinks  . Drug use: No  . Sexual activity: Not on file  Other Topics Concern  . Not on file  Social History Narrative   married, lives with spouse, retired Therapist, sports   Social Determinants of Radio broadcast assistant Strain:   . Difficulty of Paying Living Expenses: Not on file  Food Insecurity:   . Worried About Charity fundraiser in the Last Year: Not on file  . Ran Out of Food in the Last Year: Not on file  Transportation Needs:   . Lack of Transportation (Medical): Not on file  . Lack of Transportation (Non-Medical): Not on file  Physical Activity:   . Days of Exercise per Week: Not on file  . Minutes of Exercise per Session: Not on file  Stress:   . Feeling of Stress : Not on file  Social Connections:   . Frequency of Communication with Friends and Family: Not on file  . Frequency of Social Gatherings with Friends and Family: Not on file  . Attends Religious Services: Not on file  . Active Member of Clubs or Organizations: Not on file  . Attends Archivist Meetings: Not on file  . Marital Status: Not on file    Family History  Problem Relation Age of Onset  . Asthma Mother   . Cancer Mother   . Heart disease Mother   . Hypertension Father   . Heart disease Father   . Hyperlipidemia Father   . Stroke Father   . Mental illness Father   . Stroke Maternal Grandmother   . Heart disease Maternal Grandfather   . Hypertension Paternal Grandfather     Review of Systems  Constitutional: Negative for chills and fever.  HENT: Positive for rhinorrhea.   Respiratory: Positive for shortness of breath (chronic - no change). Negative for cough and wheezing.   Cardiovascular:  Positive for leg swelling. Negative for chest pain and palpitations.  Neurological: Negative for light-headedness and headaches.       Objective:   Vitals:   10/23/19 1354  BP: 132/80  Pulse: 65  Temp: 98.6 F (37 C)  SpO2: 94%   BP Readings from Last 3 Encounters:  10/23/19 132/80  04/20/19 138/76  10/16/18 (!) 156/92   Wt Readings from Last 3  Encounters:  10/23/19 162 lb (73.5 kg)  04/20/19 170 lb (77.1 kg)  10/16/18 171 lb 1.9 oz (77.6 kg)   Body mass index is 32.72 kg/m.   Physical Exam    Constitutional: Appears well-developed and well-nourished. No distress.  HENT:  Head: Normocephalic and atraumatic.  Neck: Neck supple. No tracheal deviation present. No thyromegaly present.  No cervical lymphadenopathy Cardiovascular: Normal rate, regular rhythm and normal heart sounds.   No murmur heard. No carotid bruit .  No edema Pulmonary/Chest: Effort normal and breath sounds normal. No respiratory distress. No has no wheezes. No rales.  Skin: Skin is warm and dry. Not diaphoretic.  Psychiatric: Normal mood and affect. Behavior is normal.      Assessment & Plan:    See Problem List for Assessment and Plan of chronic medical problems.    This visit occurred during the SARS-CoV-2 public health emergency.  Safety protocols were in place, including screening questions prior to the visit, additional usage of staff PPE, and extensive cleaning of exam room while observing appropriate contact time as indicated for disinfecting solutions.

## 2019-10-23 ENCOUNTER — Ambulatory Visit (INDEPENDENT_AMBULATORY_CARE_PROVIDER_SITE_OTHER): Payer: Medicare Other | Admitting: Internal Medicine

## 2019-10-23 ENCOUNTER — Encounter: Payer: Self-pay | Admitting: Internal Medicine

## 2019-10-23 ENCOUNTER — Other Ambulatory Visit: Payer: Self-pay

## 2019-10-23 VITALS — BP 132/80 | HR 65 | Temp 98.6°F | Wt 162.0 lb

## 2019-10-23 DIAGNOSIS — G500 Trigeminal neuralgia: Secondary | ICD-10-CM | POA: Diagnosis not present

## 2019-10-23 DIAGNOSIS — R7303 Prediabetes: Secondary | ICD-10-CM

## 2019-10-23 DIAGNOSIS — Z23 Encounter for immunization: Secondary | ICD-10-CM | POA: Diagnosis not present

## 2019-10-23 DIAGNOSIS — E785 Hyperlipidemia, unspecified: Secondary | ICD-10-CM

## 2019-10-23 DIAGNOSIS — M1990 Unspecified osteoarthritis, unspecified site: Secondary | ICD-10-CM | POA: Diagnosis not present

## 2019-10-23 DIAGNOSIS — E039 Hypothyroidism, unspecified: Secondary | ICD-10-CM

## 2019-10-23 DIAGNOSIS — R2689 Other abnormalities of gait and mobility: Secondary | ICD-10-CM | POA: Diagnosis not present

## 2019-10-23 DIAGNOSIS — K219 Gastro-esophageal reflux disease without esophagitis: Secondary | ICD-10-CM

## 2019-10-23 DIAGNOSIS — I10 Essential (primary) hypertension: Secondary | ICD-10-CM | POA: Diagnosis not present

## 2019-10-23 NOTE — Assessment & Plan Note (Signed)
Chronic Check a1c Low sugar / carb diet Stressed regular exercise  

## 2019-10-23 NOTE — Assessment & Plan Note (Signed)
Chronic In multiple joints Taking etodolac 400 mg daily Continue

## 2019-10-23 NOTE — Assessment & Plan Note (Addendum)
Chronic-ongoing for months, but intermittent Initially diagnosed by oral surgeon Deferred MRI earlier this year Pain did go away, but has recurred Refer to neurology  Did try carbamazepine for very short time.,  But did not feel like it helped at a low dose and was concerned about it affecting her memory so she stopped it Also tried gabapentin at a low dose

## 2019-10-23 NOTE — Assessment & Plan Note (Signed)
Chronic Getting worse Would benefit from a rollator - rx given

## 2019-10-23 NOTE — Assessment & Plan Note (Signed)
Chronic  Clinically euthyroid Check tsh Continue Synthroid 112 mcg daily Titrate med dose if needed

## 2019-10-23 NOTE — Assessment & Plan Note (Signed)
Chronic Check lipid panel  Continue atorvastatin 20 mg  Regular exercise and healthy diet encouraged

## 2019-10-23 NOTE — Assessment & Plan Note (Signed)
Chronic BP well controlled Continue amlodipine 5 mg daily, losartan 100 mg daily  cmp  

## 2019-10-23 NOTE — Assessment & Plan Note (Signed)
Chronic GERD controlled Continue famotidine 20 mg daily 

## 2019-10-25 ENCOUNTER — Other Ambulatory Visit: Payer: Self-pay | Admitting: Internal Medicine

## 2019-11-19 DIAGNOSIS — Z23 Encounter for immunization: Secondary | ICD-10-CM | POA: Diagnosis not present

## 2019-12-24 DIAGNOSIS — H9113 Presbycusis, bilateral: Secondary | ICD-10-CM | POA: Diagnosis not present

## 2019-12-24 DIAGNOSIS — H903 Sensorineural hearing loss, bilateral: Secondary | ICD-10-CM | POA: Diagnosis not present

## 2019-12-31 ENCOUNTER — Other Ambulatory Visit: Payer: Self-pay

## 2019-12-31 ENCOUNTER — Encounter: Payer: Self-pay | Admitting: Neurology

## 2019-12-31 ENCOUNTER — Ambulatory Visit (INDEPENDENT_AMBULATORY_CARE_PROVIDER_SITE_OTHER): Payer: Medicare Other | Admitting: Neurology

## 2019-12-31 VITALS — BP 125/70 | HR 68 | Ht 59.0 in | Wt 162.0 lb

## 2019-12-31 DIAGNOSIS — G509 Disorder of trigeminal nerve, unspecified: Secondary | ICD-10-CM | POA: Diagnosis not present

## 2019-12-31 DIAGNOSIS — R519 Headache, unspecified: Secondary | ICD-10-CM

## 2019-12-31 NOTE — Progress Notes (Signed)
[id number]    Provider:  Dr Jaynee Eagles Requesting Provider: Binnie Rail, MD Primary Care Provider:  Binnie Rail, MD  CC:  Trigeminal neuralgia  HPI:  Anna Adams is a 85 y.o. female here as requested by Binnie Rail, MD for trigeminal neuralgia. PMHx hypothyroid, HTN, asthma, prediabetes, dyslipidemia, hyperparathyroidism, trigeminal neuralgia, parotid tumor. She has trigeminal pain right side of her face.  It started in March 2020. She is with her husband who provides information. Happened with a toothache. SHe went to the dentist who diagnosed her with TGN. Worsening since September. On the right. Feels like a toothache and becoming more general. More on the right lower side of the face. The last few weeks she feels it may radiate to the left but maybe not. Also feeling it rarely in the temple area, most V2/V3 right face. More of a dull ache. No taste problems or smell problems, no weakness of the face, no numbness or sensory changes but it is more a toothache. On and off all day long. Happening more frequently. No known triggers, not chewing or smiling or chewing or the wind blowing. At first it was "now and again". A time or two she felt biting on the right would trigger it or make it worse. No vision changes right eye. But worsening, becomin more frequent, more severe, it is uncomfortable. Not pulsating or pounding, no light or sound sensitivity, no rash or lesions, she has a remote history of smoking. No changes in taste or smell and mostly in the bottom of the jaw.   Reviewed notes, labs and imaging from outside physicians, which showed:  Ultrasound 03/28/2013: personally reviewed report CLINICAL DATA: Palpable small nodule in the left submandibular  region   EXAM:  ULTRASOUND OF HEAD/NECK SOFT TISSUES   TECHNIQUE:  Ultrasound examination of the head and neck soft tissues was  performed in the area of clinical concern.   COMPARISON: MR soft tissue neck of  06/26/2005   FINDINGS:  At the site in question, there is a small 3 x 2 x 3 mm hypoechoic  structure. This appears to demonstrate some through transmission and  probably represents a small cyst or possibly a tiny lymph node. No  blood flow is demonstrated within this hypoechoic structure.   IMPRESSION:  3 mm hypoechoic structure corresponds to the palpable abnormality  and may represent a small cyst.   TSH 2.33, cmp normal, hgba1c 5.7, cbc normal 04/20/2019  Review of Systems: Patient complains of symptoms per HPI as well as the following symptoms facial pain. Pertinent negatives and positives per HPI. All others negative.   Social History   Socioeconomic History  . Marital status: Widowed    Spouse name: Not on file  . Number of children: 2  . Years of education: Not on file  . Highest education level: Not on file  Occupational History  . Not on file  Tobacco Use  . Smoking status: Former Smoker    Quit date: 09/01/1962    Years since quitting: 57.3  . Smokeless tobacco: Never Used  Substance and Sexual Activity  . Alcohol use: No    Alcohol/week: 0.0 standard drinks  . Drug use: No  . Sexual activity: Not on file  Other Topics Concern  . Not on file  Social History Narrative   Recently widowed, 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, retired Therapist, sports   Right handed   Caffeine: up to 4-5 cups/day (coffee and tea)   Social  Determinants of Health   Financial Resource Strain:   . Difficulty of Paying Living Expenses: Not on file  Food Insecurity:   . Worried About Charity fundraiser in the Last Year: Not on file  . Ran Out of Food in the Last Year: Not on file  Transportation Needs:   . Lack of Transportation (Medical): Not on file  . Lack of Transportation (Non-Medical): Not on file  Physical Activity:   . Days of Exercise per Week: Not on file  . Minutes of Exercise per Session: Not on file  Stress:   . Feeling of Stress : Not on file  Social Connections:   . Frequency of  Communication with Friends and Family: Not on file  . Frequency of Social Gatherings with Friends and Family: Not on file  . Attends Religious Services: Not on file  . Active Member of Clubs or Organizations: Not on file  . Attends Archivist Meetings: Not on file  . Marital Status: Not on file  Intimate Partner Violence:   . Fear of Current or Ex-Partner: Not on file  . Emotionally Abused: Not on file  . Physically Abused: Not on file  . Sexually Abused: Not on file    Family History  Problem Relation Age of Onset  . Asthma Mother   . Cancer Mother   . Heart disease Mother   . Hypertension Father   . Heart disease Father   . Hyperlipidemia Father   . Stroke Father   . Mental illness Father   . Stroke Maternal Grandmother   . Heart disease Maternal Grandfather   . Hypertension Paternal Grandfather     Past Medical History:  Diagnosis Date  . ALLERGIC RHINITIS   . Anemia   . Asthma   . Asthma   . Cataract   . GERD (gastroesophageal reflux disease)    with HH  . History of renal calculi   . Hypertension   . Hypothyroidism   . Osteoarthritis   . Parotid tumor    L side, s/p resection 1980 and 2007    Patient Active Problem List   Diagnosis Date Noted  . Poor balance 10/23/2019  . Trigeminal neuralgia 04/20/2019  . Right carpal tunnel syndrome 04/12/2018  . Hyperparathyroidism ([place]) 04/10/2018  . Diverticulitis 10/12/2017  . Prediabetes 10/11/2017  . Squamous cell skin cancer 04/01/2016  . Hiatal hernia, large 09/22/2015  . Pain in lower limb 03/04/2015  . Edema 11/26/2013  . Parotid tumor 03/23/2013  . Dyslipidemia   . Onychomycosis due to dermatophyte 05/05/2012  . Osteoporosis, post-menopausal   . GERD (gastroesophageal reflux disease)   . Benign hypertensive heart disease without heart failure 04/28/2011  . Osteoarthritis 01/08/2011  . Hypothyroidism 02/07/2009  . Essential hypertension 02/07/2009  . ALLERGIC RHINITIS 02/07/2009  .  Intrinsic asthma 02/07/2009    Past Surgical History:  Procedure Laterality Date  . CARDIOVASCULAR STRESS TEST  07/12/2005   EF 85%  . CERVICAL DISCECTOMY  1993  . [place]   (L) wrist x's 2  . KNEE ARTHROPLASTY  04/22/2006   DR. WQuillian Quince CAFFREY  . Left knee replacement  2005  . LUMBAR DISC SURGERY    . PAROTID GLAND TUMOR EXCISION  1980, 2007   Left  . right knee replacement  2008  . TONSILLECTOMY AND ADENOIDECTOMY    . TUBAL LIGATION      Current Outpatient Medications  Medication Sig Dispense Refill  . amLODipine (  NORVASC) 5 MG tablet Take 1 tablet (5 mg total) by mouth daily. 90 tablet 3  . atorvastatin (LIPITOR) 20 MG tablet TAKE 1 TABLET DAILY. 90 tablet 1  . b complex vitamins tablet Take 1 tablet by mouth daily.      . cholecalciferol (VITAMIN D) 1000 UNITS tablet Take 400 Units by mouth daily.     Marland Kitchen etodolac (LODINE XL) 400 MG 24 hr tablet TAKE 1 TABLET DAILY 90 tablet 1  . famotidine (PEPCID) 20 MG tablet TAKE 1 TABLET BY MOUTH EVERY DAY 90 tablet 1  . hydrocortisone-pramoxine (ANALPRAM HC) 2.5-1 % rectal cream Place 1 application rectally 3 (three) times daily. 30 g 0  . latanoprost (XALATAN) 0.005 % ophthalmic solution     . losartan (COZAAR) 100 MG tablet TAKE 1 TABLET BY MOUTH EVERY DAY 90 tablet 3  . meclizine (ANTIVERT) 25 MG tablet Take 1 tablet (25 mg total) by mouth 3 (three) times daily as needed for dizziness. 30 tablet 5  . Pramoxine-HC (HYDROCORTISONE ACE-PRAMOXINE) 2.5-1 % CREA Place rectally.    Marland Kitchen PROVENTIL HFA 108 (90 Base) MCG/ACT inhaler INHALE 1-2 PUFFS INTO THE LUNGS EVERY 6 (SIX) HOURS AS NEEDED FOR WHEEZING. 6.7 Inhaler 7  . SYNTHROID 112 MCG tablet TAKE 1 TABLET BY MOUTH EVERY DAY 90 tablet 1  . UNABLE TO FIND at bedtime. Osteo-ease    . vitamin E 200 UNIT capsule Take 200 Units by mouth daily.       No current facility-administered medications for this visit.    Allergies as of 12/31/2019 - Review Complete  12/31/2019  Allergen Reaction Noted  . Dyphylline-guaifenesin  09/01/2010  . Erythromycin Nausea Only   . Gabapentin  04/20/2019  . Mevacor [lovastatin]  04/28/2011  . Other  01/08/2011    Vitals: BP 125/70 (BP Location: Right Arm, Patient Position: Sitting)   Pulse 68   Ht 4\' 11"  (1.499 m)   Wt 162 lb (73.5 kg)   BMI 32.72 kg/m  Last Weight:  Wt Readings from Last 1 Encounters:  12/31/19 162 lb (73.5 kg)   Last Height:   Ht Readings from Last 1 Encounters:  12/31/19 4\' 11"  (1.499 m)     Physical exam: Exam: Gen: NAD, conversant, well nourised, obese, elderly, pleasant, well groomed       CV: RRR, no MRG. No Carotid Bruits. No peripheral edema, warm, nontender Eyes: Conjunctivae clear without exudates or hemorrhage  Neuro: Detailed Neurologic Exam  Speech:    Speech is normal; fluent and spontaneous with normal comprehension.  Cognition:    The patient is oriented to person, place, and time;     recent and remote memory intact;     language fluent;     normal attention, concentration,     fund of knowledge Cranial Nerves:    The pupils are equal, round, and reactive to light.pupils too small to visualize fundi.  Visual fields are full to threat. Extraocular movements are intact. Trigeminal sensation is intact and the muscles of mastication are normal. The face is symmetric. The palate elevates in the midline. Hearing intact. Voice is normal. Shoulder shrug is normal. The tongue has normal motion without fasciculations.   Coordination:    No dysmetria or ataxia.   Gait:    Not shuffling or ataxic  Motor Observation:    No asymmetry, no atrophy, and no involuntary movements noted. Tone:    Normal muscle tone.    Posture:    Posture is normal. normal erect  Strength:    Strength is V/V in the upper and lower limbs.      Sensation: intact to LT     Reflex Exam:  DTR's:    Deep tendon reflexes in the upper and lower extremities are symmetrical  bilaterally.  Absent AJs, trace patellars, 1+ biceps.  Toes:    The toes are downgoing bilaterally.   Clonus:    Clonus is absent.    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 We will get an MRI brain w/wo contrast Trigeminal protocol. Discussed medication at this time she deferers. We discussed vascular compression and also other procedures like radiofrequency ablation.   Orders Placed This Encounter  Procedures  . MR BRAIN W WO CONTRAST  . Basic Metabolic Panel  . Sedimentation rate  . C-reactive protein     Cc: Binnie Rail, MD,  Binnie Rail, MD  Sarina Ill, MD  [facility] [address] [place], [place] [address]  Phone [phone] Fax [phone]

## 2019-12-31 NOTE — Patient Instructions (Signed)
MRI brain with trigeminal protocol      Trigeminal Neuralgia  Trigeminal neuralgia is a nerve disorder that causes severe pain on one side of the face. The pain may last from a few seconds to several minutes. The pain is usually only on one side of the face. Symptoms may occur for days, weeks, or months and then go away for months or years. The pain may return and be worse than before. What are the causes? This condition is caused by damage or pressure to a nerve in the head that is called the trigeminal nerve. An attack can be triggered by:  Talking.  Chewing.  Putting on makeup.  Washing your face.  Shaving your face.  Brushing your teeth.  Touching your face. What increases the risk? You are more likely to develop this condition if you:  Are 83 years of age or older.  Are female. What are the signs or symptoms? The main symptom of this condition is severe pain in the:  Jaw.  Lips.  Eyes.  Nose.  Scalp.  Forehead.  Face. The pain may be:  Intense.  Stabbing.  Electric.  Shock-like. How is this diagnosed? This condition is diagnosed with a physical exam. A CT scan or an MRI may be done to rule out other conditions that can cause facial pain. How is this treated? This condition may be treated with:  Avoiding the things that trigger your symptoms.  Taking prescription medicines (anticonvulsants).  Having surgery. This may be done in severe cases if other medical treatment does not provide relief.  Having procedures such as ablation, thermal, or radiation therapy. It may take up to one month for treatment to start relieving the pain. Follow these instructions at home: Managing pain  Learn as much as you can about how to manage your pain. Ask your health care provider if a pain specialist would be helpful.  Consider talking with a mental health care provider (psychologist) about how to cope with the pain.  Consider joining a pain support group.  General instructions  Take over-the-counter and prescription medicines only as told by your health care provider.  Avoid the things that trigger your symptoms. It may help to: ? Chew on the unaffected side of your mouth. ? Avoid touching your face. ? Avoid blasts of hot or cold air.  Follow your treatment plan as told by your health care provider. This may include: ? Cognitive or behavioral therapy. ? Gentle, regular exercise. ? Meditation or yoga. ? Aromatherapy.  Keep all follow-up visits as told by your health care provider. You may need to be monitored closely to make sure treatment is working well for you. Where to find more information  Facial Pain Association: fpa-support.org Contact a health care provider if:  Your medicine is not helping your symptoms.  You have side effects from the medicine used for treatment.  You develop new, unexplained symptoms, such as: ? Double vision. ? Facial weakness. ? Facial numbness. ? Changes in hearing or balance.  You feel depressed. Get help right away if:  Your pain is severe and is not getting better.  You develop suicidal thoughts. If you ever feel like you may hurt yourself or others, or have thoughts about taking your own life, get help right away. You can go to your nearest emergency department or call:  Your local emergency services (911 in the [place]  A suicide crisis helpline, such as the [place] at (347)535-1208. This is open  24 hours a day. Summary  Trigeminal neuralgia is a nerve disorder that causes severe pain on one side of the face. The pain may last from a few seconds to several minutes.  This condition is caused by damage or pressure to a nerve in the head that is called the trigeminal nerve.  Treatment may include avoiding the things that trigger your symptoms, taking medicines, or having surgery or procedures. It may take up to one month for treatment to start relieving the  pain.  Avoid the things that trigger your symptoms.  Keep all follow-up visits as told by your health care provider. You may need to be monitored closely to make sure treatment is working well for you. This information is not intended to replace advice given to you by your health care provider. Make sure you discuss any questions you have with your health care provider. Document Revised: 11/28/2017 Document Reviewed: 11/28/2017 Elsevier Patient Education  [place].

## 2020-01-01 ENCOUNTER — Telehealth: Payer: Self-pay | Admitting: Neurology

## 2020-01-01 LAB — BASIC METABOLIC PANEL
BUN/Creatinine Ratio: 23 (ref 12–28)
BUN: 17 mg/dL (ref 8–27)
CO2: 26 mmol/L (ref 20–29)
Calcium: 10.6 mg/dL — ABNORMAL HIGH (ref 8.7–10.3)
Chloride: 99 mmol/L (ref 96–106)
Creatinine, Ser: 0.73 mg/dL (ref 0.57–1.00)
GFR calc Af Amer: 88 mL/min/{1.73_m2} (ref >59)
GFR calc non Af Amer: 76 mL/min/{1.73_m2} (ref >59)
Glucose: 96 mg/dL (ref 65–99)
Potassium: 4.8 mmol/L (ref 3.5–5.2)
Sodium: 139 mmol/L (ref 134–144)

## 2020-01-01 LAB — C-REACTIVE PROTEIN: CRP: <1 mg/L (ref 0–10)

## 2020-01-01 LAB — SEDIMENTATION RATE: Sed Rate: 4 mm/hr (ref 0–40)

## 2020-01-01 NOTE — Telephone Encounter (Signed)
Medicare/Cigna order sent to [facility]. They will obtain the auth for Cigna and reach out to the patient to schedule.

## 2020-01-07 ENCOUNTER — Other Ambulatory Visit: Payer: Self-pay

## 2020-01-07 ENCOUNTER — Ambulatory Visit (INDEPENDENT_AMBULATORY_CARE_PROVIDER_SITE_OTHER): Payer: Medicare Other | Admitting: Podiatry

## 2020-01-07 DIAGNOSIS — L989 Disorder of the skin and subcutaneous tissue, unspecified: Secondary | ICD-10-CM

## 2020-01-07 DIAGNOSIS — M79676 Pain in unspecified toe(s): Secondary | ICD-10-CM

## 2020-01-07 DIAGNOSIS — B351 Tinea unguium: Secondary | ICD-10-CM | POA: Diagnosis not present

## 2020-01-12 ENCOUNTER — Ambulatory Visit
Admission: RE | Admit: 2020-01-12 | Discharge: 2020-01-12 | Disposition: A | Discharge status: Home / Self Care | Payer: Medicare Other | Source: Ambulatory Visit | Attending: Neurology | Admitting: Neurology

## 2020-01-12 DIAGNOSIS — I6782 Cerebral ischemia: Secondary | ICD-10-CM | POA: Diagnosis not present

## 2020-01-12 DIAGNOSIS — R200 Anesthesia of skin: Secondary | ICD-10-CM | POA: Diagnosis not present

## 2020-01-12 DIAGNOSIS — G509 Disorder of trigeminal nerve, unspecified: Secondary | ICD-10-CM

## 2020-01-12 DIAGNOSIS — M47812 Spondylosis without myelopathy or radiculopathy, cervical region: Secondary | ICD-10-CM | POA: Diagnosis not present

## 2020-01-12 DIAGNOSIS — R519 Headache, unspecified: Secondary | ICD-10-CM

## 2020-01-12 DIAGNOSIS — G319 Degenerative disease of nervous system, unspecified: Secondary | ICD-10-CM | POA: Diagnosis not present

## 2020-01-12 IMAGING — MR MR HEAD WO/W CM
5 of 8 series · 23 of 48 positions shown · IV contrast (15ml Multihance)
Comparison: None.

CLINICAL DATA: Headache. Cluster type. Trigeminal nerve palsy.
Facial numbness. Right-sided atypical trigeminal symptoms.

EXAM:
MRI HEAD WITHOUT AND WITH CONTRAST
TECHNIQUE: Multiplanar, multiecho pulse sequences of the brain and surrounding
structures were obtained without and with intravenous contrast.
CONTRAST:  15mL MULTIHANCE GADOBENATE DIMEGLUMINE 529 MG/ML IV SOLN

[Series 3: T1 · sagittal · 3.0mm · 0.35mm/px · 5 of 40 slices shown (1 of 3)]
[im 1/40]
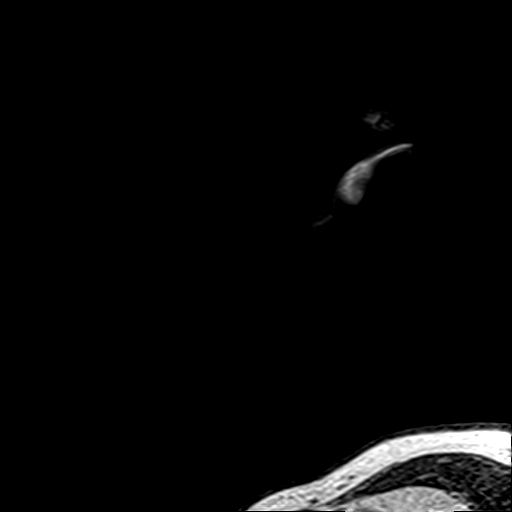
[im 10/40]
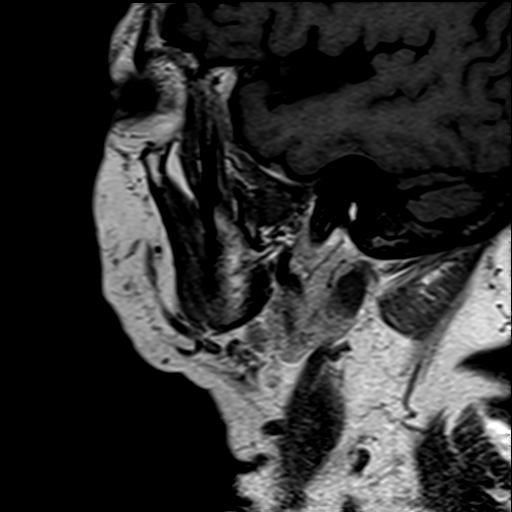
[im 20/40]
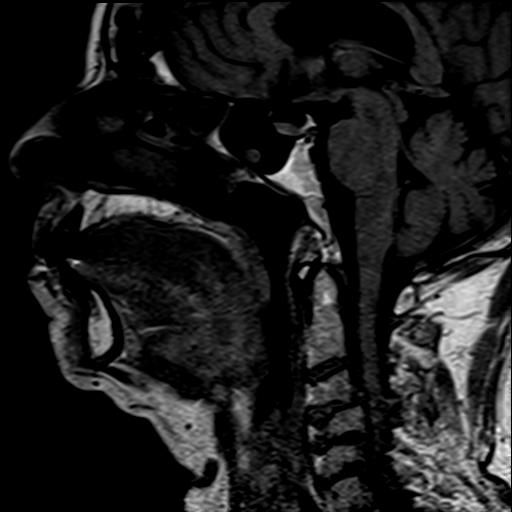
[im 30/40]
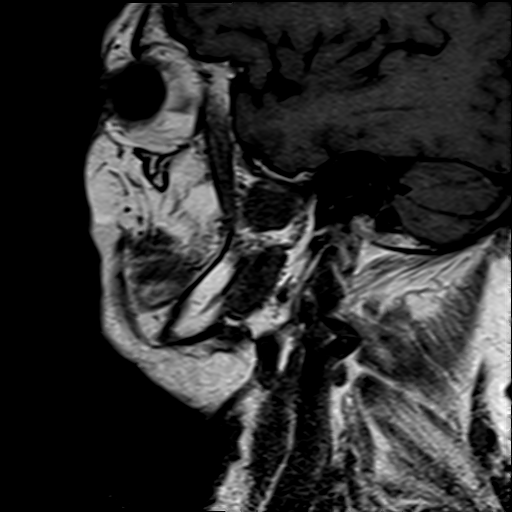
[im 40/40]
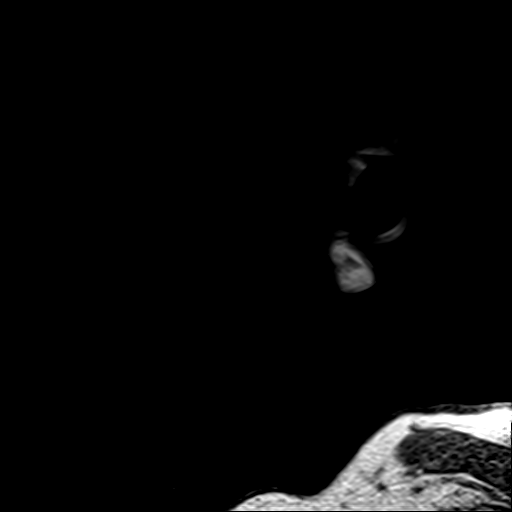

[Series 4: T2 · coronal · 3.0mm · 0.35mm/px · 5 of 42 slices shown]
[im 1/42]
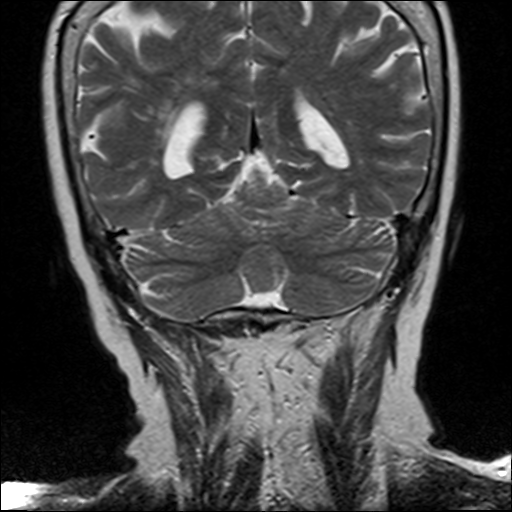
[im 11/42]
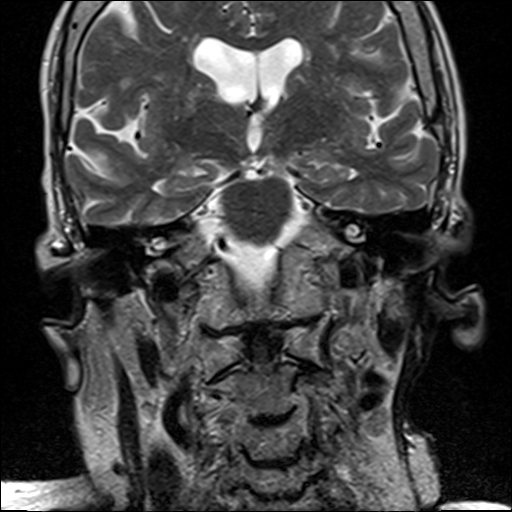
[im 21/42]
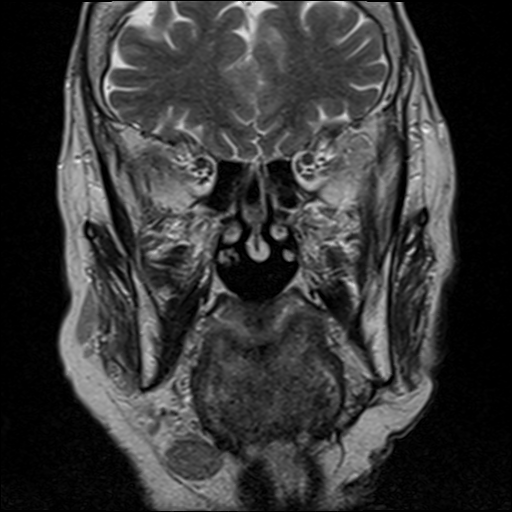
[im 31/42]
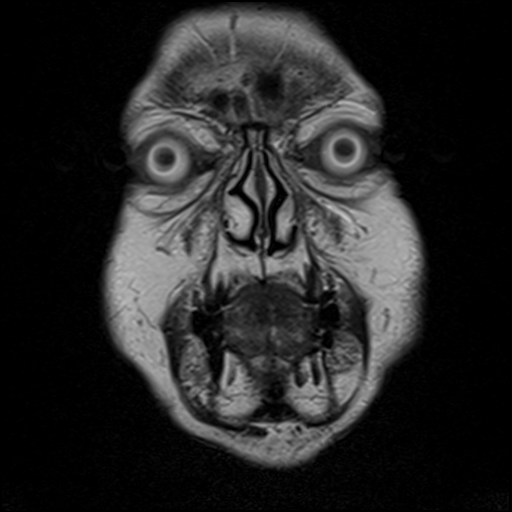
[im 42/42]
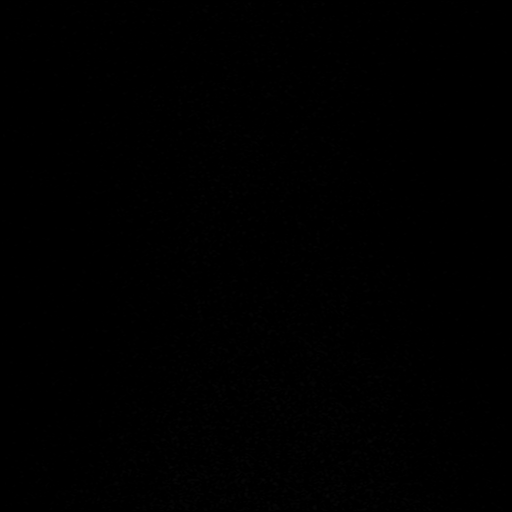

[Series 7: T1 · coronal · 3.0mm · 0.35mm/px · 5 of 48 slices shown (2 of 3)]
[im 1/48]
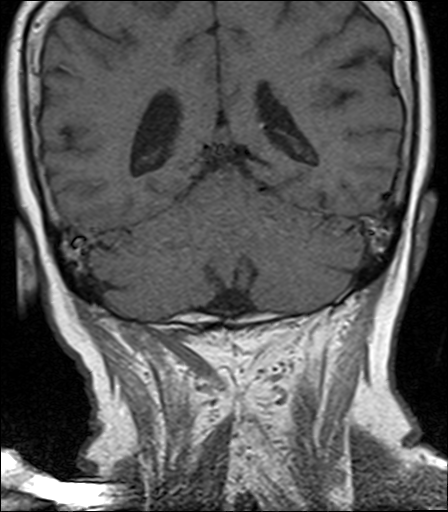
[im 12/48]
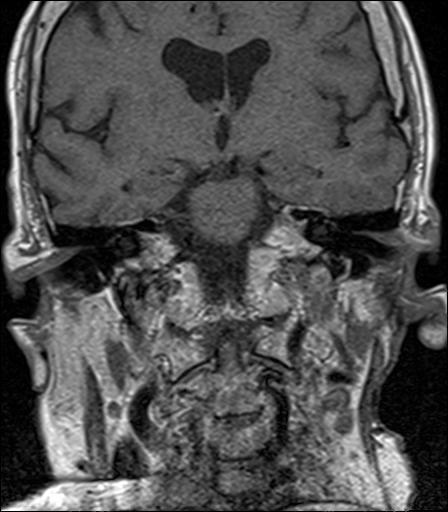
[im 24/48]
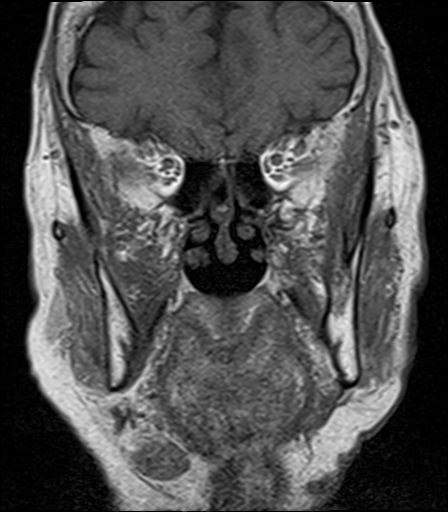
[im 36/48]
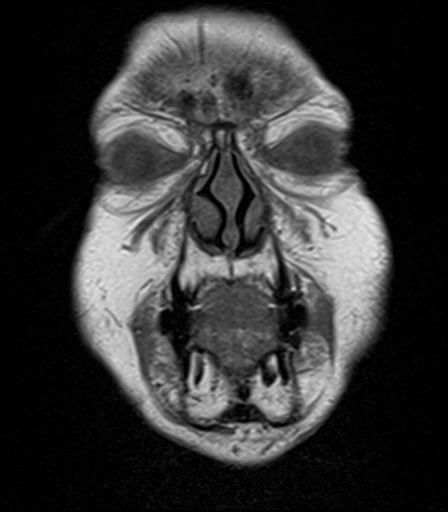
[im 48/48]
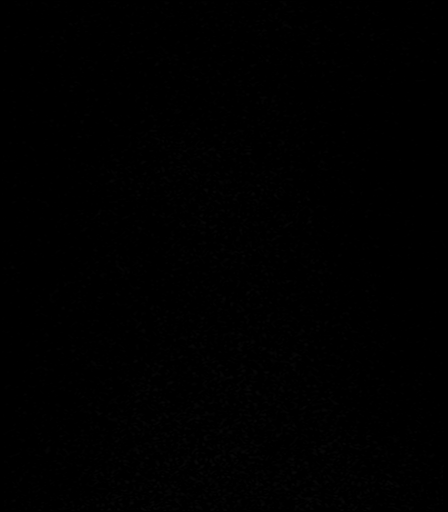

[Series 8: T1 · axial · 3.0mm · 0.50mm/px · z∈[-136,+20]mm · 5 of 48 slices shown (3 of 3)]
[im 1/48]
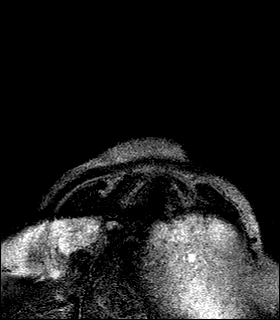
[im 12/48]
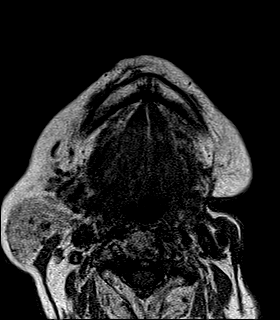
[im 24/48]
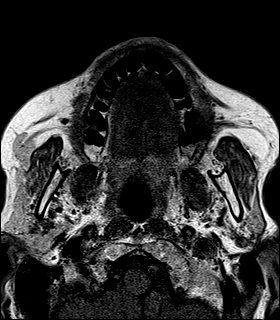
[im 36/48]
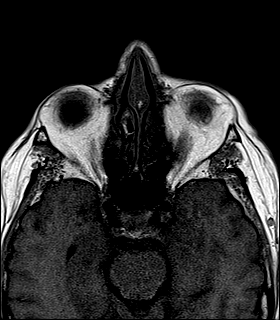
[im 48/48]
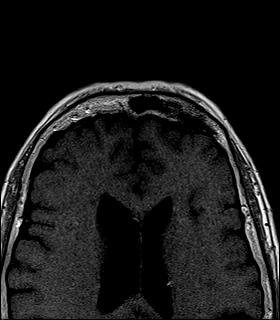

[Series 9: T1 fat-sat · coronal · 3.0mm · 0.35mm/px · 3 of 48 slices shown]
[im 1/48]
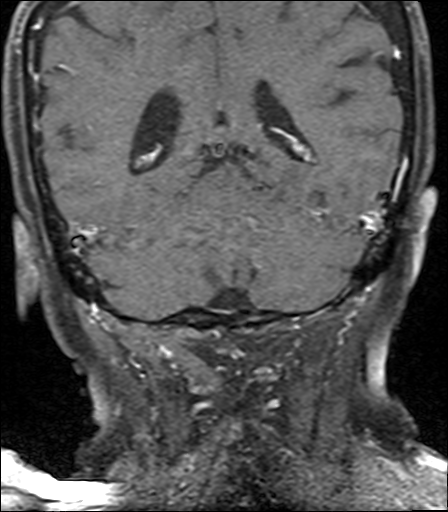
[im 12/48]
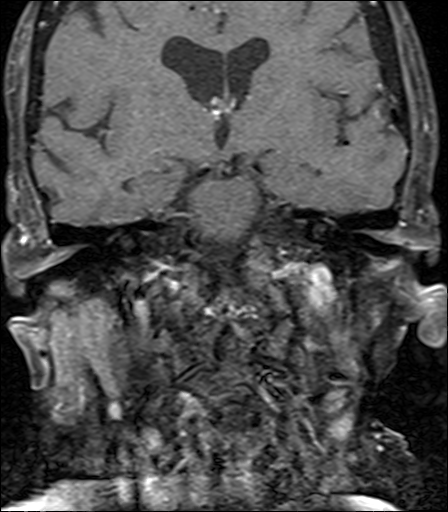
[im 24/48]
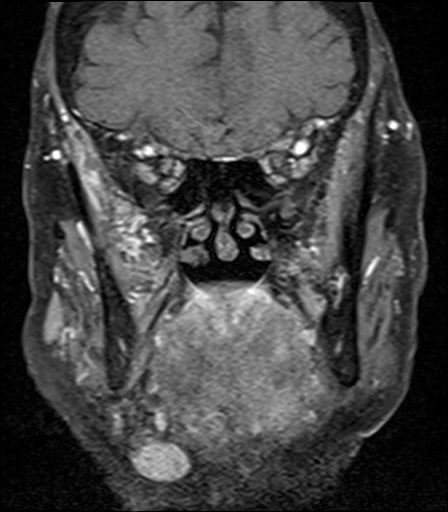

[23 of 48 positions shown; findings below may reference images not displayed]

FINDINGS: Brain: Brainstem appears normal. Visualized cerebellum is normal.
Visualized cerebral hemispheres show mild chronic small-vessel
ischemic changes of the hemispheric white matter. The entire brain
was not evaluated.

Fifth nerve root entry zones appear normal. There is a vessel
adjacent to the right trigeminal nerve 8 mm from the root entry zone
which looks more likely to be a vein rather than a cerebellar artery
branch. No abnormality seen affecting either Meckel's cave. No
infratemporal fossa lesion on either side.

Other regional cranial nerves also appear normal including normal CP
angle regions and seventh and eighth nerve complexes. No temporal
bone lesion seen. No skull base lesion. Degenerative cervical
spondylosis, including spinal stenosis at C3-4 and C4-5.

Incidental note is made of an absent left submandibular gland. The
left parotid gland is markedly atrophic or partially resected. No
other regional soft tissue lesion.
IMPRESSION: 1. No abnormality seen to explain trigeminal neuralgia on the right.
There is a vessel adjacent to the right trigeminal nerve 8 mm from
the root entry zone which looks more likely to be a vein rather than
a cerebellar artery branch.
2. No other regional cranial nerve pathology seen.
3. Mild chronic small-vessel ischemic changes of the cerebral
hemispheric white matter.
4. Degenerative cervical spondylosis with spinal stenosis at C3-4
and C4-5.

## 2020-01-12 MED ORDER — GADOBENATE DIMEGLUMINE 529 MG/ML IV SOLN
15.0000 mL | Freq: Once | INTRAVENOUS | Status: AC | PRN
  Administered 2020-01-12: 15 mL via INTRAVENOUS

## 2020-01-13 NOTE — Progress Notes (Signed)
   SUBJECTIVE Patient presents to office today complaining of elongated, thickened nails that cause pain while ambulating in shoes. She is unable to trim her own nails. Patient is here for further evaluation and treatment.  Past Medical History:  Diagnosis Date  . ALLERGIC RHINITIS   . Anemia   . Asthma   . Asthma   . Cataract   . GERD (gastroesophageal reflux disease)    with HH  . History of renal calculi   . Hypertension   . Hypothyroidism   . Osteoarthritis   . Parotid tumor    L side, s/p resection 1980 and 2007    OBJECTIVE General Patient is awake, alert, and oriented x 3 and in no acute distress. Derm Skin is dry and supple bilateral. Negative open lesions or macerations. Remaining integument unremarkable. Nails are tender, long, thickened and dystrophic with subungual debris, consistent with onychomycosis, 1-5 bilateral. No signs of infection noted.  There is some diffuse hyperkeratotic skin tissue noted to the distal tips of the toes bilateral Vasc  DP and PT pedal pulses palpable bilaterally. Temperature gradient within normal limits.  Neuro Epicritic and protective threshold sensation grossly intact bilaterally.  Musculoskeletal Exam No symptomatic pedal deformities noted bilateral. Muscular strength within normal limits.  ASSESSMENT 1. Onychodystrophic nails 1-5 bilateral with hyperkeratosis of nails.  2. Onychomycosis of nail due to dermatophyte bilateral 3.  Preulcerative callus lesions bilateral feet  4.  Pain in foot bilateral  PLAN OF CARE 1. Patient evaluated today.  2. Instructed to maintain good pedal hygiene and foot care.  3. Mechanical debridement of nails 1-5 bilaterally performed using a nail nipper. Filed with dremel without incident.  4.  Excisional debridement of the hyperkeratotic preulcerative callus lesions was performed using a tissue nipper without incident or bleeding  5.  Return to clinic in 3 mos.    Edrick Kins, DPM [facility]  Dr. Edrick Kins, [address]                                        [place], [place] [address]                Office [phone]  Fax [phone]

## 2020-01-15 ENCOUNTER — Other Ambulatory Visit: Payer: Self-pay | Admitting: Internal Medicine

## 2020-01-21 ENCOUNTER — Other Ambulatory Visit: Payer: Self-pay | Admitting: Neurology

## 2020-01-21 DIAGNOSIS — Z79899 Other long term (current) drug therapy: Secondary | ICD-10-CM

## 2020-01-21 DIAGNOSIS — G500 Trigeminal neuralgia: Secondary | ICD-10-CM

## 2020-01-21 MED ORDER — OXCARBAZEPINE 150 MG PO TABS: 150.0000 mg | ORAL_TABLET | Freq: Two times a day (BID) | ORAL | 6 refills | Status: DC

## 2020-02-22 DIAGNOSIS — H21542 Posterior synechiae (iris), left eye: Secondary | ICD-10-CM | POA: Diagnosis not present

## 2020-02-22 DIAGNOSIS — H40033 Anatomical narrow angle, bilateral: Secondary | ICD-10-CM | POA: Diagnosis not present

## 2020-02-22 DIAGNOSIS — H25813 Combined forms of age-related cataract, bilateral: Secondary | ICD-10-CM | POA: Diagnosis not present

## 2020-02-22 DIAGNOSIS — H5203 Hypermetropia, bilateral: Secondary | ICD-10-CM | POA: Diagnosis not present

## 2020-04-14 ENCOUNTER — Other Ambulatory Visit: Payer: Self-pay | Admitting: Internal Medicine

## 2020-04-21 ENCOUNTER — Other Ambulatory Visit: Payer: Self-pay | Admitting: Endocrinology

## 2020-04-21 DIAGNOSIS — M810 Age-related osteoporosis without current pathological fracture: Secondary | ICD-10-CM

## 2020-04-21 DIAGNOSIS — Z1231 Encounter for screening mammogram for malignant neoplasm of breast: Secondary | ICD-10-CM

## 2020-04-22 ENCOUNTER — Ambulatory Visit: Payer: Medicare Other | Admitting: Internal Medicine

## 2020-05-05 ENCOUNTER — Ambulatory Visit (INDEPENDENT_AMBULATORY_CARE_PROVIDER_SITE_OTHER): Payer: Medicare Other | Admitting: Orthopedic Surgery

## 2020-05-05 DIAGNOSIS — M19011 Primary osteoarthritis, right shoulder: Secondary | ICD-10-CM

## 2020-05-06 ENCOUNTER — Encounter: Payer: Self-pay | Admitting: Orthopedic Surgery

## 2020-05-06 DIAGNOSIS — M19012 Primary osteoarthritis, left shoulder: Secondary | ICD-10-CM | POA: Diagnosis not present

## 2020-05-06 NOTE — Progress Notes (Signed)
Office Visit Note   Patient: Anna Adams           Date of Birth: Jul 29, 1934           MRN: [id number] Visit Date: 05/05/2020 Requested by: Binnie Rail, MD [place],  [place] [id number] PCP: Binnie Rail, MD  Subjective: Chief Complaint  Patient presents with  . Left Shoulder - Pain    HPI: Anna Adams is a 86 y.o. female who presents to the office complaining of left shoulder pain.  Patient reports history of about 1 year of left shoulder pain.  She describes lateral/superior/posterior left shoulder pain.  Denies any radiation of pain or radicular pain down her arm.  Denies any numbness/tingling or neck discomfort/pain.  No weakness of the shoulder.  Pain does not wake her up at night and she denies any history of prior shoulder surgery or dislocation.  She does note this pain feels similar to the right shoulder pain she dealt with in 2021.  She had bilateral shoulder radiographs in June 2021 that revealed bilateral shoulder arthritis, worst in the right shoulder.  She had right shoulder glenohumeral injection at the time with excellent relief that lasts until this day.  She requests an injection in the left shoulder..                ROS: All systems reviewed are negative as they relate to the chief complaint within the history of present illness.  Patient denies fevers or chills.  Assessment & Plan: Visit Diagnoses:  1. Bilateral shoulder region arthritis     Plan: Patient is a 86 year old female who presents complaining of left shoulder pain.  She has history of left shoulder arthritis, diagnosed on radiographs from June 2021.  She had excellent relief of her right shoulder arthritic pain from glenohumeral injection in 2021 and requested left shoulder injection today.  Left shoulder glenohumeral injection was administered and patient tolerated the procedure well.  Plan for patient follow-up with the office as needed if her pain does not significantly improve or if  pain returns.  Patient agreed with plan.  Follow-Up Instructions: No follow-ups on file.   Orders:  No orders of the defined types were placed in this encounter.  No orders of the defined types were placed in this encounter.     Procedures: Large Joint Inj: L glenohumeral on 05/06/2020 9:10 PM Indications: diagnostic evaluation and pain Details: 18 G 1.5 in needle, posterior approach  Arthrogram: No  Medications: 9 mL bupivacaine 0.5 %; 40 mg methylPREDNISolone acetate 40 MG/ML; 5 mL lidocaine 1 % Outcome: tolerated well, no immediate complications Procedure, treatment alternatives, risks and benefits explained, specific risks discussed. Consent was given by the patient. Immediately prior to procedure a time out was called to verify the correct patient, procedure, equipment, support staff and site/side marked as required. Patient was prepped and draped in the usual sterile fashion.       Clinical Data: No additional findings.  Objective: Vital Signs: There were no vitals taken for this visit.  Physical Exam:  Constitutional: Patient appears well-developed HEENT:  Head: Normocephalic Eyes:EOM are normal Neck: Normal range of motion Cardiovascular: Normal rate Pulmonary/chest: Effort normal Neurologic: Patient is alert Skin: Skin is warm Psychiatric: Patient has normal mood and affect  Ortho Exam: Ortho exam demonstrates left shoulder with 40 degrees external rotation, 110 degrees abduction, 170 degrees forward flexion.  She has excellent supraspinatus, infraspinatus, subscapularis strength with no significant crepitus  noted with passive motion of the shoulder.  No tenderness throughout the axial cervical spine.  No tenderness over the [facility] joint or bicipital groove.  She does have pain with passive motion of the shoulder but negative drop arm test and negative Hornblower sign.  5/5 motor strength of bilateral grip strength, finger abduction, pronation/supination, bicep,  tricep, deltoid.  Specialty Comments:  No specialty comments available.  Imaging: No results found.   PMFS History: Patient Active Problem List   Diagnosis Date Noted  . Disorder of right trigeminal nerve 12/31/2019  . Poor balance 10/23/2019  . Trigeminal neuralgia 04/20/2019  . Right carpal tunnel syndrome 04/12/2018  . Hyperparathyroidism ([place]) 04/10/2018  . Diverticulitis 10/12/2017  . Prediabetes 10/11/2017  . Squamous cell skin cancer 04/01/2016  . Hiatal hernia, large 09/22/2015  . Pain in lower limb 03/04/2015  . Edema 11/26/2013  . Parotid tumor 03/23/2013  . Dyslipidemia   . Onychomycosis due to dermatophyte 05/05/2012  . Osteoporosis, post-menopausal   . GERD (gastroesophageal reflux disease)   . Benign hypertensive heart disease without heart failure 04/28/2011  . Osteoarthritis 01/08/2011  . Hypothyroidism 02/07/2009  . Essential hypertension 02/07/2009  . ALLERGIC RHINITIS 02/07/2009  . Intrinsic asthma 02/07/2009   Past Medical History:  Diagnosis Date  . ALLERGIC RHINITIS   . Anemia   . Asthma   . Asthma   . Cataract   . GERD (gastroesophageal reflux disease)    with HH  . History of renal calculi   . Hypertension   . Hypothyroidism   . Osteoarthritis   . Parotid tumor    L side, s/p resection 1980 and 2007    Family History  Problem Relation Age of Onset  . Asthma Mother   . Cancer Mother   . Heart disease Mother   . Hypertension Father   . Heart disease Father   . Hyperlipidemia Father   . Stroke Father   . Mental illness Father   . Stroke Maternal Grandmother   . Heart disease Maternal Grandfather   . Hypertension Paternal Grandfather     Past Surgical History:  Procedure Laterality Date  . CARDIOVASCULAR STRESS TEST  07/12/2005   EF 85%  . CERVICAL DISCECTOMY  1993  . [place]   (L) wrist x's 2  . KNEE ARTHROPLASTY  04/22/2006   DR. WQuillian Quince CAFFREY  . Left knee replacement  2005  . LUMBAR DISC  SURGERY    . PAROTID GLAND TUMOR EXCISION  1980, 2007   Left  . right knee replacement  2008  . TONSILLECTOMY AND ADENOIDECTOMY    . TUBAL LIGATION     Social History   Occupational History  . Not on file  Tobacco Use  . Smoking status: Former Smoker    Quit date: 09/01/1962    Years since quitting: 57.7  . Smokeless tobacco: Never Used  Substance and Sexual Activity  . Alcohol use: No    Alcohol/week: 0.0 standard drinks  . Drug use: No  . Sexual activity: Not on file

## 2020-05-07 ENCOUNTER — Ambulatory Visit (INDEPENDENT_AMBULATORY_CARE_PROVIDER_SITE_OTHER): Payer: Medicare Other | Admitting: Podiatry

## 2020-05-07 ENCOUNTER — Other Ambulatory Visit: Payer: Self-pay

## 2020-05-07 DIAGNOSIS — M79676 Pain in unspecified toe(s): Secondary | ICD-10-CM | POA: Diagnosis not present

## 2020-05-07 DIAGNOSIS — L989 Disorder of the skin and subcutaneous tissue, unspecified: Secondary | ICD-10-CM

## 2020-05-07 DIAGNOSIS — M2011 Hallux valgus (acquired), right foot: Secondary | ICD-10-CM | POA: Diagnosis not present

## 2020-05-07 DIAGNOSIS — M79671 Pain in right foot: Secondary | ICD-10-CM | POA: Diagnosis not present

## 2020-05-07 DIAGNOSIS — M2012 Hallux valgus (acquired), left foot: Secondary | ICD-10-CM | POA: Diagnosis not present

## 2020-05-07 DIAGNOSIS — B351 Tinea unguium: Secondary | ICD-10-CM | POA: Diagnosis not present

## 2020-05-07 DIAGNOSIS — M79672 Pain in left foot: Secondary | ICD-10-CM

## 2020-05-07 NOTE — Progress Notes (Signed)
   SUBJECTIVE Patient presents to office today complaining of elongated, thickened nails that cause pain while ambulating in shoes. She is unable to trim her own nails. Patient is here for further evaluation and treatment.  Past Medical History:  Diagnosis Date  . ALLERGIC RHINITIS   . Anemia   . Asthma   . Asthma   . Cataract   . GERD (gastroesophageal reflux disease)    with HH  . History of renal calculi   . Hypertension   . Hypothyroidism   . Osteoarthritis   . Parotid tumor    L side, s/p resection 1980 and 2007    OBJECTIVE General Patient is awake, alert, and oriented x 3 and in no acute distress. Derm Skin is dry and supple bilateral. Negative open lesions or macerations. Remaining integument unremarkable. Nails are tender, long, thickened and dystrophic with subungual debris, consistent with onychomycosis, 1-5 bilateral. No signs of infection noted.  There is some diffuse hyperkeratotic skin tissue noted to the distal tips of the toes bilateral Vasc  DP and PT pedal pulses palpable bilaterally. Temperature gradient within normal limits.  Neuro Epicritic and protective threshold sensation grossly intact bilaterally.  Musculoskeletal Exam No symptomatic pedal deformities noted bilateral. Muscular strength within normal limits.  Clinical evidence of a hallux valgus deformity bilateral with lateral deviation of the great toes  ASSESSMENT 1. Onychodystrophic nails 1-5 bilateral with hyperkeratosis of nails.  2. Onychomycosis of nail due to dermatophyte bilateral 3.  Preulcerative callus lesions bilateral feet  4.  Pain in foot bilateral 5.  Hallux valgus bilateral  PLAN OF CARE 1. Patient evaluated today.  2. Instructed to maintain good pedal hygiene and foot care.  3. Mechanical debridement of nails 1-5 bilaterally performed using a nail nipper. Filed with dremel without incident.  4.  Excisional debridement of the hyperkeratotic preulcerative callus lesions was performed  using a tissue nipper without incident or bleeding  5.  Today we did discuss the hallux valgus and bunion deformities to the bilateral feet.  Currently the patient wears wide fitting shoes that do not aggravate her bunions.  Continue wearing good supportive shoes that are wide fitting.  Recommend conservative treatment versus surgical. 6.  Return to clinic in 3 months   Edrick Kins, DPM [facility]  Dr. Edrick Kins, [address]                                        [place], [place] [address]                Office [phone]  Fax [phone]

## 2020-05-08 MED ORDER — BUPIVACAINE HCL 0.5 % IJ SOLN
9.0000 mL | INTRAMUSCULAR | Status: AC | PRN
  Administered 2020-05-06: 9 mL via INTRA_ARTICULAR

## 2020-05-08 MED ORDER — METHYLPREDNISOLONE ACETATE 40 MG/ML IJ SUSP
40.0000 mg | INTRAMUSCULAR | Status: AC | PRN
  Administered 2020-05-06: 40 mg via INTRA_ARTICULAR

## 2020-05-08 MED ORDER — LIDOCAINE HCL 1 % IJ SOLN
5.0000 mL | INTRAMUSCULAR | Status: AC | PRN
  Administered 2020-05-06: 5 mL

## 2020-05-19 ENCOUNTER — Other Ambulatory Visit: Payer: Self-pay

## 2020-05-19 NOTE — Patient Instructions (Addendum)
  Blood work was ordered.     Medications changes include :  none    A referral was ordered for home PT.       Someone from their office will call you to schedule an appointment.    Please followup in 6 months

## 2020-05-19 NOTE — Progress Notes (Signed)
Subjective:    Patient ID: Anna Adams, female    DOB: 1934/12/01, 86 y.o.   MRN: [id number]  HPI The patient is here for follow up of their chronic medical problems, including htn, hypothyroidism, prediabetes, GERD, hyperlipidemia, OA, R side trigeminal pain   Her husband died about one year ago.   She fell good Friday at night going to the bathroom. She tripped on a box that was on the floor. She has bruising in her b/l orbits and left 1-2 toes.  She was evaluated by EMS at home that night.  She is using a cane since then.  Her balance is poor.   Seeing Dr Chalmers Cater   Medications and allergies reviewed with patient and updated if appropriate.  Patient Active Problem List   Diagnosis Date Noted  . Disorder of right trigeminal nerve 12/31/2019  . Poor balance 10/23/2019  . Trigeminal neuralgia 04/20/2019  . Right carpal tunnel syndrome 04/12/2018  . Hyperparathyroidism ([place]) 04/10/2018  . Diverticulitis 10/12/2017  . Prediabetes 10/11/2017  . Squamous cell skin cancer 04/01/2016  . Hiatal hernia, large 09/22/2015  . Pain in lower limb 03/04/2015  . Edema 11/26/2013  . Parotid tumor 03/23/2013  . Dyslipidemia   . Onychomycosis due to dermatophyte 05/05/2012  . Osteoporosis, post-menopausal   . GERD (gastroesophageal reflux disease)   . Benign hypertensive heart disease without heart failure 04/28/2011  . Osteoarthritis 01/08/2011  . Hypothyroidism 02/07/2009  . Essential hypertension 02/07/2009  . ALLERGIC RHINITIS 02/07/2009  . Intrinsic asthma 02/07/2009    Current Outpatient Medications on File Prior to Visit  Medication Sig Dispense Refill  . amLODipine (NORVASC) 5 MG tablet Take 1 tablet (5 mg total) by mouth daily. 90 tablet 3  . atorvastatin (LIPITOR) 20 MG tablet TAKE 1 TABLET DAILY        (DISCONTINUED CARBAMAZEPINECAP 100MG  ER) 90 tablet 1  . b complex vitamins tablet Take 1 tablet by mouth daily.    . Biotin 10000 MCG TABS See admin instructions.    .  cholecalciferol (VITAMIN D) 1000 UNITS tablet Take 400 Units by mouth daily.    Marland Kitchen etodolac (LODINE XL) 400 MG 24 hr tablet TAKE 1 TABLET DAILY 90 tablet 1  . hydrocortisone-pramoxine (ANALPRAM HC) 2.5-1 % rectal cream Place 1 application rectally 3 (three) times daily. 30 g 0  . latanoprost (XALATAN) 0.005 % ophthalmic solution     . meclizine (ANTIVERT) 25 MG tablet Take 1 tablet (25 mg total) by mouth 3 (three) times daily as needed for dizziness. 30 tablet 5  . OXcarbazepine (TRILEPTAL) 150 MG tablet Take 1 tablet (150 mg total) by mouth 2 (two) times daily. 60 tablet 6  . Pramoxine-HC (HYDROCORTISONE ACE-PRAMOXINE) 2.5-1 % CREA Place rectally.    Marland Kitchen PROVENTIL HFA 108 (90 Base) MCG/ACT inhaler INHALE 1-2 PUFFS INTO THE LUNGS EVERY 6 (SIX) HOURS AS NEEDED FOR WHEEZING. 6.7 Inhaler 7  . SYNTHROID 112 MCG tablet TAKE 1 TABLET BY MOUTH EVERY DAY 90 tablet 1  . telmisartan (MICARDIS) 80 MG tablet Take 1 tablet (80 mg total) by mouth daily. 90 tablet 1  . UNABLE TO FIND at bedtime. Osteo-ease    . vitamin E 200 UNIT capsule Take 200 Units by mouth daily.    . zoledronic acid (RECLAST) 5 MG/100ML SOLN injection See admin instructions.     No current facility-administered medications on file prior to visit.    Past Medical History:  Diagnosis Date  . ALLERGIC RHINITIS   .  Anemia   . Asthma   . Asthma   . Cataract   . GERD (gastroesophageal reflux disease)    with HH  . History of renal calculi   . Hypertension   . Hypothyroidism   . Osteoarthritis   . Parotid tumor    L side, s/p resection 1980 and 2007    Past Surgical History:  Procedure Laterality Date  . CARDIOVASCULAR STRESS TEST  07/12/2005   EF 85%  . CERVICAL DISCECTOMY  1993  . [place]   (L) wrist x's 2  . KNEE ARTHROPLASTY  04/22/2006   DR. WQuillian Quince CAFFREY  . Left knee replacement  2005  . LUMBAR DISC SURGERY    . PAROTID GLAND TUMOR EXCISION  1980, 2007   Left  . right knee replacement   2008  . TONSILLECTOMY AND ADENOIDECTOMY    . TUBAL LIGATION      Social History   Socioeconomic History  . Marital status: Widowed    Spouse name: Not on file  . Number of children: 2  . Years of education: Not on file  . Highest education level: Not on file  Occupational History  . Not on file  Tobacco Use  . Smoking status: Former Smoker    Quit date: 09/01/1962    Years since quitting: 57.7  . Smokeless tobacco: Never Used  Substance and Sexual Activity  . Alcohol use: No    Alcohol/week: 0.0 standard drinks  . Drug use: No  . Sexual activity: Not on file  Other Topics Concern  . 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 Asthma Mother   . Cancer Mother   . Heart disease Mother   . Hypertension Father   . Heart disease Father   . Hyperlipidemia Father   . Stroke Father   . Mental illness Father   . Stroke Maternal Grandmother   . Heart disease Maternal Grandfather   . Hypertension Paternal Grandfather     Review of Systems  Constitutional: Negative for chills and fever.  Respiratory: Negative for cough, shortness of breath (chronic- no change) and wheezing.   Cardiovascular: Positive for leg swelling (chronic- no change). Negative for chest pain and palpitations.  Neurological: Positive for light-headedness (occ - when sitting down). Negative for headaches (head pain from trigeminal neuralgia).       Objective:   Vitals:   05/20/20 1300  BP: 126/72  Pulse: 83  Temp: 98 F (36.7 C)  SpO2: 95%   BP Readings from Last 3 Encounters:  05/20/20 126/72  12/31/19  125/70  10/23/19 132/80   Wt Readings from Last 3 Encounters:  05/20/20 163 lb 12.8 oz (74.3 kg)  12/31/19 162 lb (73.5 kg)  10/23/19 162 lb (73.5 kg)   Body mass index is 33.08 kg/m.   Physical Exam    Constitutional: Appears well-developed and well-nourished. No distress.  HENT:  Head: Normocephalic  Neck: Neck supple. No tracheal deviation present. No thyromegaly present.  No cervical lymphadenopathy Cardiovascular:  Normal rate, regular rhythm and normal heart sounds.   No murmur heard. No carotid bruit .  Mild B/L LE edema Pulmonary/Chest: Effort normal and breath sounds normal. No respiratory distress. No has no wheezes. No rales.  Skin: Skin is warm and dry. Not diaphoretic. Bruising Left orbit > right orbit and left first two toes which are non tender Psychiatric: Normal mood and affect. Behavior is normal.      Assessment & Plan:    See Problem List for Assessment and Plan of chronic medical problems.    This visit occurred during the SARS-CoV-2 public health emergency.  Safety protocols were in place, including screening questions prior to the visit, additional usage of staff PPE, and extensive cleaning of exam room while observing appropriate contact time as indicated for disinfecting solutions.

## 2020-05-20 ENCOUNTER — Encounter: Payer: Self-pay | Admitting: Internal Medicine

## 2020-05-20 ENCOUNTER — Other Ambulatory Visit: Payer: Self-pay

## 2020-05-20 ENCOUNTER — Ambulatory Visit (INDEPENDENT_AMBULATORY_CARE_PROVIDER_SITE_OTHER): Payer: Medicare Other | Admitting: Internal Medicine

## 2020-05-20 VITALS — BP 126/72 | HR 83 | Temp 98.0°F | Ht 59.0 in | Wt 163.8 lb

## 2020-05-20 DIAGNOSIS — I10 Essential (primary) hypertension: Secondary | ICD-10-CM | POA: Diagnosis not present

## 2020-05-20 DIAGNOSIS — M8949 Other hypertrophic osteoarthropathy, multiple sites: Secondary | ICD-10-CM | POA: Diagnosis not present

## 2020-05-20 DIAGNOSIS — M159 Polyosteoarthritis, unspecified: Secondary | ICD-10-CM

## 2020-05-20 DIAGNOSIS — R2689 Other abnormalities of gait and mobility: Secondary | ICD-10-CM | POA: Diagnosis not present

## 2020-05-20 DIAGNOSIS — E785 Hyperlipidemia, unspecified: Secondary | ICD-10-CM

## 2020-05-20 DIAGNOSIS — G500 Trigeminal neuralgia: Secondary | ICD-10-CM

## 2020-05-20 DIAGNOSIS — K219 Gastro-esophageal reflux disease without esophagitis: Secondary | ICD-10-CM

## 2020-05-20 DIAGNOSIS — R7303 Prediabetes: Secondary | ICD-10-CM

## 2020-05-20 DIAGNOSIS — E039 Hypothyroidism, unspecified: Secondary | ICD-10-CM | POA: Diagnosis not present

## 2020-05-20 DIAGNOSIS — W19XXXA Unspecified fall, initial encounter: Secondary | ICD-10-CM | POA: Insufficient documentation

## 2020-05-20 DIAGNOSIS — M810 Age-related osteoporosis without current pathological fracture: Secondary | ICD-10-CM | POA: Diagnosis not present

## 2020-05-20 LAB — CBC WITH DIFFERENTIAL/PLATELET
Basophils Absolute: 0 10*3/uL (ref 0.0–0.1)
Basophils Relative: 0.7 % (ref 0.0–3.0)
Eosinophils Absolute: 0.1 10*3/uL (ref 0.0–0.7)
Eosinophils Relative: 1.2 % (ref 0.0–5.0)
HCT: 40.7 % (ref 36.0–46.0)
Hemoglobin: 13.4 g/dL (ref 12.0–15.0)
Lymphocytes Relative: 16.7 % (ref 12.0–46.0)
Lymphs Abs: 1.1 10*3/uL (ref 0.7–4.0)
MCHC: 33 g/dL (ref 30.0–36.0)
MCV: 87.5 fl (ref 78.0–100.0)
Monocytes Absolute: 0.4 10*3/uL (ref 0.1–1.0)
Monocytes Relative: 5.5 % (ref 3.0–12.0)
Neutro Abs: 5 10*3/uL (ref 1.4–7.7)
Neutrophils Relative %: 75.9 % (ref 43.0–77.0)
Platelets: 232 10*3/uL (ref 150.0–400.0)
RBC: 4.65 Mil/uL (ref 3.87–5.11)
RDW: 13.9 % (ref 11.5–15.5)
WBC: 6.6 10*3/uL (ref 4.0–10.5)

## 2020-05-20 LAB — HEMOGLOBIN A1C: Hgb A1c MFr Bld: 5.8 % (ref 4.6–6.5)

## 2020-05-20 LAB — TSH: TSH: 3.26 u[IU]/mL (ref 0.35–4.50)

## 2020-05-20 LAB — COMPREHENSIVE METABOLIC PANEL
ALT: 11 U/L (ref 0–35)
AST: 21 U/L (ref 0–37)
Albumin: 4.2 g/dL (ref 3.5–5.2)
Alkaline Phosphatase: 72 U/L (ref 39–117)
BUN: 16 mg/dL (ref 6–23)
CO2: 31 mEq/L (ref 19–32)
Calcium: 10.8 mg/dL — ABNORMAL HIGH (ref 8.4–10.5)
Chloride: 97 mEq/L (ref 96–112)
Creatinine, Ser: 0.68 mg/dL (ref 0.40–1.20)
GFR: 80.07 mL/min (ref >60.00)
Glucose, Bld: 87 mg/dL (ref 70–99)
Potassium: 4.6 mEq/L (ref 3.5–5.1)
Sodium: 133 mEq/L — ABNORMAL LOW (ref 135–145)
Total Bilirubin: 0.6 mg/dL (ref 0.2–1.2)
Total Protein: 7.1 g/dL (ref 6.0–8.3)

## 2020-05-20 LAB — LIPID PANEL
Cholesterol: 212 mg/dL — ABNORMAL HIGH (ref 0–200)
HDL: 81.9 mg/dL (ref >39.00)
LDL Cholesterol: 114 mg/dL — ABNORMAL HIGH (ref 0–99)
NonHDL: 130.24
Total CHOL/HDL Ratio: 3
Triglycerides: 83 mg/dL (ref 0.0–149.0)
VLDL: 16.6 mg/dL (ref 0.0–40.0)

## 2020-05-20 LAB — VITAMIN D 25 HYDROXY (VIT D DEFICIENCY, FRACTURES): VITD: 40.8 ng/mL (ref 30.00–100.00)

## 2020-05-20 MED ORDER — FAMOTIDINE 20 MG PO TABS
20.0000 mg | ORAL_TABLET | Freq: Two times a day (BID) | ORAL | 1 refills | Status: DC
Start: 2020-05-20 — End: 2021-01-12

## 2020-05-20 NOTE — Assessment & Plan Note (Addendum)
Chronic Controlled Taking tylenol as needed Continue etodolac 400 mg daily

## 2020-05-20 NOTE — Assessment & Plan Note (Signed)
Acute  Fall at home - tripped over a box at night going to the bathroom Bruising b/l orbits, left toes -  improving - no pain or symptoms of a concussion at this time Ordered home PT Discussed fall prevention

## 2020-05-20 NOTE — Assessment & Plan Note (Signed)
Chronic Check lipid panel  Continue atorvastatin 20 mg daily Regular exercise and healthy diet encouraged  

## 2020-05-20 NOTE — Assessment & Plan Note (Signed)
Chronic Managed by endocrine 

## 2020-05-20 NOTE — Assessment & Plan Note (Signed)
Chronic Recent fall at home  Referred for home PT Discussed fall prevention

## 2020-05-20 NOTE — Assessment & Plan Note (Signed)
Chronic Managed by Dr Chalmers Cater

## 2020-05-20 NOTE — Assessment & Plan Note (Addendum)
Chronic BP well controlled Continue amlodipine 5 mg daily, telmisartan 80 mg daily cmp  

## 2020-05-20 NOTE — Assessment & Plan Note (Signed)
Chronic Has seen neurology Taking Trileptal 150 mg twice daily with some improvement CBC, CMP today

## 2020-05-20 NOTE — Assessment & Plan Note (Signed)
Chronic Check a1c Low sugar / carb diet Stressed regular exercise  

## 2020-05-20 NOTE — Assessment & Plan Note (Signed)
Chronic GERD controlled Continue famotidine 20 mg daily - twice daily depending on symptoms

## 2020-05-25 DIAGNOSIS — E785 Hyperlipidemia, unspecified: Secondary | ICD-10-CM | POA: Diagnosis not present

## 2020-05-25 DIAGNOSIS — M150 Primary generalized (osteo)arthritis: Secondary | ICD-10-CM | POA: Diagnosis not present

## 2020-05-25 DIAGNOSIS — Z96652 Presence of left artificial knee joint: Secondary | ICD-10-CM | POA: Diagnosis not present

## 2020-05-25 DIAGNOSIS — Z96651 Presence of right artificial knee joint: Secondary | ICD-10-CM | POA: Diagnosis not present

## 2020-05-25 DIAGNOSIS — D649 Anemia, unspecified: Secondary | ICD-10-CM | POA: Diagnosis not present

## 2020-05-25 DIAGNOSIS — G5600 Carpal tunnel syndrome, unspecified upper limb: Secondary | ICD-10-CM | POA: Diagnosis not present

## 2020-05-25 DIAGNOSIS — E039 Hypothyroidism, unspecified: Secondary | ICD-10-CM | POA: Diagnosis not present

## 2020-05-25 DIAGNOSIS — J45909 Unspecified asthma, uncomplicated: Secondary | ICD-10-CM | POA: Diagnosis not present

## 2020-05-25 DIAGNOSIS — Z85828 Personal history of other malignant neoplasm of skin: Secondary | ICD-10-CM | POA: Diagnosis not present

## 2020-05-25 DIAGNOSIS — D500 Iron deficiency anemia secondary to blood loss (chronic): Secondary | ICD-10-CM | POA: Diagnosis not present

## 2020-05-25 DIAGNOSIS — R7309 Other abnormal glucose: Secondary | ICD-10-CM | POA: Diagnosis not present

## 2020-05-25 DIAGNOSIS — H269 Unspecified cataract: Secondary | ICD-10-CM | POA: Diagnosis not present

## 2020-05-25 DIAGNOSIS — K219 Gastro-esophageal reflux disease without esophagitis: Secondary | ICD-10-CM | POA: Diagnosis not present

## 2020-05-25 DIAGNOSIS — I119 Hypertensive heart disease without heart failure: Secondary | ICD-10-CM | POA: Diagnosis not present

## 2020-05-25 DIAGNOSIS — Z87891 Personal history of nicotine dependence: Secondary | ICD-10-CM | POA: Diagnosis not present

## 2020-05-25 DIAGNOSIS — M810 Age-related osteoporosis without current pathological fracture: Secondary | ICD-10-CM | POA: Diagnosis not present

## 2020-05-25 DIAGNOSIS — K5792 Diverticulitis of intestine, part unspecified, without perforation or abscess without bleeding: Secondary | ICD-10-CM | POA: Diagnosis not present

## 2020-06-04 DIAGNOSIS — Z87891 Personal history of nicotine dependence: Secondary | ICD-10-CM

## 2020-06-04 DIAGNOSIS — G5600 Carpal tunnel syndrome, unspecified upper limb: Secondary | ICD-10-CM | POA: Diagnosis not present

## 2020-06-04 DIAGNOSIS — R7309 Other abnormal glucose: Secondary | ICD-10-CM | POA: Diagnosis not present

## 2020-06-04 DIAGNOSIS — Z96651 Presence of right artificial knee joint: Secondary | ICD-10-CM

## 2020-06-04 DIAGNOSIS — D500 Iron deficiency anemia secondary to blood loss (chronic): Secondary | ICD-10-CM | POA: Diagnosis not present

## 2020-06-04 DIAGNOSIS — Z85828 Personal history of other malignant neoplasm of skin: Secondary | ICD-10-CM | POA: Diagnosis not present

## 2020-06-04 DIAGNOSIS — J45909 Unspecified asthma, uncomplicated: Secondary | ICD-10-CM | POA: Diagnosis not present

## 2020-06-04 DIAGNOSIS — D649 Anemia, unspecified: Secondary | ICD-10-CM | POA: Diagnosis not present

## 2020-06-04 DIAGNOSIS — Z96652 Presence of left artificial knee joint: Secondary | ICD-10-CM

## 2020-06-04 DIAGNOSIS — E785 Hyperlipidemia, unspecified: Secondary | ICD-10-CM | POA: Diagnosis not present

## 2020-06-04 DIAGNOSIS — K5792 Diverticulitis of intestine, part unspecified, without perforation or abscess without bleeding: Secondary | ICD-10-CM | POA: Diagnosis not present

## 2020-06-04 DIAGNOSIS — I119 Hypertensive heart disease without heart failure: Secondary | ICD-10-CM | POA: Diagnosis not present

## 2020-06-04 DIAGNOSIS — K219 Gastro-esophageal reflux disease without esophagitis: Secondary | ICD-10-CM | POA: Diagnosis not present

## 2020-06-04 DIAGNOSIS — M810 Age-related osteoporosis without current pathological fracture: Secondary | ICD-10-CM

## 2020-06-04 DIAGNOSIS — H269 Unspecified cataract: Secondary | ICD-10-CM | POA: Diagnosis not present

## 2020-06-04 DIAGNOSIS — E039 Hypothyroidism, unspecified: Secondary | ICD-10-CM | POA: Diagnosis not present

## 2020-06-12 ENCOUNTER — Telehealth: Payer: Self-pay | Admitting: Internal Medicine

## 2020-06-12 NOTE — Progress Notes (Signed)
  Chronic Care Management   Outreach Note  06/12/2020 Name: Anna Adams MRN: [id number] DOB: 20-May-1934  Referred by: Binnie Rail, MD Reason for referral : No chief complaint on file.   An unsuccessful telephone outreach was attempted today. The patient was referred to the pharmacist for assistance with care management and care coordination.   Follow Up Plan:   [place]

## 2020-07-09 DIAGNOSIS — R7301 Impaired fasting glucose: Secondary | ICD-10-CM | POA: Diagnosis not present

## 2020-07-09 DIAGNOSIS — E210 Primary hyperparathyroidism: Secondary | ICD-10-CM | POA: Diagnosis not present

## 2020-07-09 DIAGNOSIS — M810 Age-related osteoporosis without current pathological fracture: Secondary | ICD-10-CM | POA: Diagnosis not present

## 2020-07-09 DIAGNOSIS — E890 Postprocedural hypothyroidism: Secondary | ICD-10-CM | POA: Diagnosis not present

## 2020-07-09 DIAGNOSIS — I10 Essential (primary) hypertension: Secondary | ICD-10-CM | POA: Diagnosis not present

## 2020-07-11 ENCOUNTER — Other Ambulatory Visit: Payer: Self-pay | Admitting: Neurology

## 2020-07-29 ENCOUNTER — Telehealth: Payer: Self-pay | Admitting: Internal Medicine

## 2020-07-29 NOTE — Telephone Encounter (Signed)
LVM for pt to rtn my call to schedule AWV with [facility]. Please schedule appt if pt calls the office.  

## 2020-08-02 ENCOUNTER — Other Ambulatory Visit: Payer: Self-pay | Admitting: Internal Medicine

## 2020-08-04 ENCOUNTER — Telehealth: Payer: Self-pay | Admitting: Internal Medicine

## 2020-08-04 NOTE — Chronic Care Management (AMB) (Signed)
  Chronic Care Management   Note  08/04/2020 Name: LOUCILE POSNER MRN: [id number] DOB: Jun 23, 1934  Sicily Zaragoza Fecher is a 86 y.o. year old female who is a primary care patient of Burns, Claudina Lick, MD. I reached out to [place] by phone today in response to a referral sent by Ms. Leonia Corona Chaplin's PCP, Binnie Rail, MD.   Ms. Nettleton was given information about Chronic Care Management services today including:  CCM service includes personalized support from designated clinical staff supervised by her physician, including individualized plan of care and coordination with other care providers 24/7 contact phone numbers for assistance for urgent and routine care needs. Service will only be billed when office clinical staff spend 20 minutes or more in a month to coordinate care. Only one practitioner may furnish and bill the service in a calendar month. The patient may stop CCM services at any time (effective at the end of the month) by phone call to the office staff.   Patient agreed to services and verbal consent obtained.   Follow up plan:   Lauretta Grill [facility] Scheduler

## 2020-08-06 ENCOUNTER — Telehealth: Payer: Self-pay | Admitting: Internal Medicine

## 2020-08-06 NOTE — Telephone Encounter (Signed)
   Patient returned called and can be reached at [phone]

## 2020-08-06 NOTE — Progress Notes (Signed)
error    This encounter was created in error - please disregard.

## 2020-08-13 DIAGNOSIS — M810 Age-related osteoporosis without current pathological fracture: Secondary | ICD-10-CM | POA: Diagnosis not present

## 2020-08-27 ENCOUNTER — Telehealth: Payer: Self-pay

## 2020-08-27 NOTE — Chronic Care Management (AMB) (Signed)
    Chronic Care Management Pharmacy Assistant   Name: Anna Adams  MRN: [id number] DOB: 1934-02-24  Anna Adams is an 86 y.o. year old female who presents for his initial CCM visit with the clinical pharmacist.  Recent office visits:  05/20/20-Stacy Lorretta Harp, MD (PCP) General follow up. Labs ordered. Referral was ordered for home PT. Follow up in 6 months.  Recent consult visits:  05/07/20-Brent Carver Fila, DPM (Podiatry) Seen for nail problem. Follow up in 3 months. 05/05/20-Gregory Alphonzo Severance, MD (Orthopedics) Seen for left shoulder pain. Joint injection 9 mL bupivacaine 0.5 %; 40 mg methylPREDNISolone acetate 40 MG/ML; 5 mL lidocaine 1 % completed in the office.  Hospital visits:  None in previous 6 months  Medications: Outpatient Encounter Medications as of 08/27/2020  Medication Sig   amLODipine (NORVASC) 5 MG tablet Take 1 tablet (5 mg total) by mouth daily.   atorvastatin (LIPITOR) 20 MG tablet TAKE 1 TABLET DAILY        (DISCONTINUED CARBAMAZEPINECAP '100MG'$  ER)   b complex vitamins tablet Take 1 tablet by mouth daily.   Biotin 10000 MCG TABS See admin instructions.   cholecalciferol (VITAMIN D) 1000 UNITS tablet Take 400 Units by mouth daily.   etodolac (LODINE XL) 400 MG 24 hr tablet TAKE 1 TABLET DAILY   famotidine (PEPCID) 20 MG tablet Take 1 tablet (20 mg total) by mouth 2 (two) times daily.   hydrocortisone-pramoxine (ANALPRAM HC) 2.5-1 % rectal cream Place 1 application rectally 3 (three) times daily.   latanoprost (XALATAN) 0.005 % ophthalmic solution    meclizine (ANTIVERT) 25 MG tablet Take 1 tablet (25 mg total) by mouth 3 (three) times daily as needed for dizziness.   OXcarbazepine (TRILEPTAL) 150 MG tablet TAKE 1 TABLET BY MOUTH TWICE A DAY   Pramoxine-HC (HYDROCORTISONE ACE-PRAMOXINE) 2.5-1 % CREA Place rectally.   PROVENTIL HFA 108 (90 Base) MCG/ACT inhaler INHALE 1-2 PUFFS INTO THE LUNGS EVERY 6 (SIX) HOURS AS NEEDED FOR WHEEZING.   SYNTHROID 112 MCG tablet TAKE  1 TABLET BY MOUTH EVERY DAY   telmisartan (MICARDIS) 80 MG tablet Take 1 tablet (80 mg total) by mouth daily.   UNABLE TO FIND at bedtime. Osteo-ease   vitamin E 200 UNIT capsule Take 200 Units by mouth daily.   zoledronic acid (RECLAST) 5 MG/100ML SOLN injection See admin instructions.   No facility-administered encounter medications on file as of 08/27/2020.    AmLODipine (NORVASC) 5 MG tablet Last filled:08/12/20 90 DS Atorvastatin (LIPITOR) 20 MG tablet Last filled:04/14/20 90 DS B complex vitamins Tablet Last filled:None noted Biotin 10000 MCG TABS Last filled:None noted Cholecalciferol (VITAMIN D) 1000 UNITS tablet Last filled:None noted Etodolac (LODINE XL) 400 MG 24 hr tablet Last filled:08/04/20 90 DS Famotidine (PEPCID) 20 MG tablet Last filled:07/27/20 90 DS Hydrocortisone-pramoxine (ANALPRAM HC) 2.5-1 % rectal cream Last filled:None noted Latanoprost (XALATAN) 0.005 % ophthalmic solution Last filled:05/13/20 90 DS Meclizine (ANTIVERT) 25 MG tablet Last filled:None noted OXcarbazepine (TRILEPTAL) 150 MG tablet Last filled:08/03/20 90 DS Pramoxine-HC (HYDROCORTISONE ACE-PRAMOXINE) 2.5-1 % CREA Last filled:None noted PROVENTIL HFA 108 (90 Base) MCG/ACT inhaler Last filled:None noted SYNTHROID 112 MCG tablet Last filled:None noted Telmisartan (MICARDIS) 80 MG tablet Last filled:04/14/20 90 DS vitamin E 200 UNIT capsule Last filled:None noted Zoledronic acid (RECLAST) 5 MG/100ML SOLN injection Last filled:None noted   Star Rating Drugs: Telmisartan (MICARDIS) 80 MG tablet Last filled:04/14/20 90 DS Atorvastatin (LIPITOR) 20 MG tablet Last filled:04/14/20 90 DS  Anna Adams, [place]

## 2020-09-10 ENCOUNTER — Ambulatory Visit: Payer: Medicare Other | Admitting: Podiatry

## 2020-09-12 ENCOUNTER — Other Ambulatory Visit: Payer: Self-pay

## 2020-09-12 ENCOUNTER — Telehealth: Payer: Medicare Other

## 2020-09-12 NOTE — Progress Notes (Deleted)
Chronic Care Management Pharmacy Note  09/12/2020 Name:  Anna Adams MRN:  [id number] DOB:  February 04, 1934  Summary: ***  Recommendations/Changes made from today's visit: ***  Plan: ***  Subjective: Anna Adams is an 86 y.o. year old female who is a primary patient of Burns, Claudina Lick, MD.  The CCM team was consulted for assistance with disease management and care coordination needs.    Engaged with patient by telephone for initial visit in response to provider referral for pharmacy case management and/or care coordination services.   Consent to Services:  The patient was given the following information about Chronic Care Management services today, agreed to services, and gave verbal consent: 1. CCM service includes personalized support from designated clinical staff supervised by the primary care provider, including individualized plan of care and coordination with other care providers 2. 24/7 contact phone numbers for assistance for urgent and routine care needs. 3. Service will only be billed when office clinical staff spend 20 minutes or more in a month to coordinate care. 4. Only one practitioner may furnish and bill the service in a calendar month. 5.The patient may stop CCM services at any time (effective at the end of the month) by phone call to the office staff. 6. The patient will be responsible for cost sharing (co-pay) of up to 20% of the service fee (after annual deductible is met). Patient agreed to services and consent obtained.  Patient Care Team: Binnie Rail, MD as PCP - General (Internal Medicine) Darlin Coco, MD (Cardiology) Shon Hough, MD (Ophthalmology) Camelia Phenes, DPM (Inactive) (Podiatry) Earlie Server, MD (Orthopedic Surgery) Clance, Armando Reichert, MD (Pulmonary Disease) Laurence Spates, MD (Inactive) (Gastroenterology) Jessy Oto, MD as Consulting Physician (Orthopedic Surgery) Delice Bison Darnelle Maffucci, [facility] as Pharmacist (Pharmacist)  Recent office  visits:  05/20/20-Stacy Lorretta Harp, MD (PCP) General follow up. Labs ordered. Referral was ordered for home PT. Follow up in 6 months.   Recent consult visits:  05/07/20-Brent Carver Fila, DPM (Podiatry) Seen for nail problem. Follow up in 3 months. 05/05/20-Gregory Alphonzo Severance, MD (Orthopedics) Seen for left shoulder pain. Joint injection 9 mL bupivacaine 0.5 %; 40 mg methylPREDNISolone acetate 40 MG/ML; 5 mL lidocaine 1 % completed in the office.   Hospital visits:  None in previous 6 months  Objective:  Lab Results  Component Value Date   CREATININE 0.68 05/20/2020   BUN 16 05/20/2020   GFR 80.07 05/20/2020   GFRNONAA 76 12/31/2019   GFRAA 88 12/31/2019   NA 133 (L) 05/20/2020   K 4.6 05/20/2020   CALCIUM 10.8 (H) 05/20/2020   CO2 31 05/20/2020   GLUCOSE 87 05/20/2020    Lab Results  Component Value Date/Time   HGBA1C 5.8 05/20/2020 01:39 PM   HGBA1C 5.7 04/20/2019 02:54 PM   GFR 80.07 05/20/2020 01:39 PM   GFR 87.01 04/20/2019 02:54 PM    Last diabetic Eye exam:  Lab Results  Component Value Date/Time   HMDIABEYEEXA [facility] 04/26/2011 12:00 AM    Last diabetic Foot exam:  Lab Results  Component Value Date/Time   HMDIABFOOTEX [facility] 07/26/2011 12:00 AM     Lab Results  Component Value Date   CHOL 212 (H) 05/20/2020   HDL 81.90 05/20/2020   LDLCALC 114 (H) 05/20/2020   TRIG 83.0 05/20/2020   CHOLHDL 3 05/20/2020    Hepatic Function Latest Ref Rng & Units 05/20/2020 04/20/2019 10/16/2018  Total Protein 6.0 - 8.3 g/dL 7.1 7.2 7.2  Albumin 3.5 - 5.2 g/dL 4.2 4.5 4.5  AST 0 - 37 U/L '21 27 22  ' ALT 0 - 35 U/L '11 14 12  ' Alk Phosphatase 39 - 117 U/L 72 78 78  Total Bilirubin 0.2 - 1.2 mg/dL 0.6 0.6 0.7  Bilirubin, Direct 0.0 - 0.3 mg/dL - - -    Lab Results  Component Value Date/Time   TSH 3.26 05/20/2020 01:39 PM   TSH 2.33 04/20/2019 02:54 PM   FREET4 1.35 02/07/2013 09:35 AM   FREET4 1.36 01/07/2012 11:26 AM    CBC Latest Ref Rng  & Units 05/20/2020 04/20/2019 04/12/2018  WBC 4.0 - 10.5 K/uL 6.6 6.0 7.2  Hemoglobin 12.0 - 15.0 g/dL 13.4 14.2 14.0  Hematocrit 36.0 - 46.0 % 40.7 42.6 42.4  Platelets 150.0 - 400.0 K/uL 232.0 189.0 226.0    Lab Results  Component Value Date/Time   VD25OH 40.80 05/20/2020 01:39 PM   VD25OH 52.26 11/28/2017 02:12 PM    Clinical ASCVD: {YES/NO:21197} The ASCVD Risk score ([place]., et al., 2013) failed to calculate for the following reasons:   The 2013 ASCVD risk score is only valid for ages 18 to 84    Depression screen PHQ 2/9 10/16/2018 09/22/2016 09/22/2015  Decreased Interest 0 0 0  Down, Depressed, Hopeless 0 0 0  PHQ - 2 Score 0 0 0  Altered sleeping - 0 -  Tired, decreased energy - 0 -  Change in appetite - 0 -  Feeling bad or failure about yourself  - 0 -  Trouble concentrating - 0 -  Moving slowly or fidgety/restless - 0 -  Suicidal thoughts - 0 -  PHQ-9 Score - 0 -  Difficult doing work/chores - Not difficult at all -     ***Other: (CHADS2VASc if Afib, MMRC or CAT for COPD, ACT, DEXA)  Social History   Tobacco Use  Smoking Status Former   Types: Cigarettes   Quit date: 09/01/1962   Years since quitting: 58.0  Smokeless Tobacco Never   BP Readings from Last 3 Encounters:  05/20/20 126/72  12/31/19 125/70  10/23/19 132/80   Pulse Readings from Last 3 Encounters:  05/20/20 83  12/31/19 68  10/23/19 65   Wt Readings from Last 3 Encounters:  05/20/20 163 lb 12.8 oz (74.3 kg)  12/31/19 162 lb (73.5 kg)  10/23/19 162 lb (73.5 kg)   BMI Readings from Last 3 Encounters:  05/20/20 33.08 kg/m  12/31/19 32.72 kg/m  10/23/19 32.72 kg/m    Assessment/Interventions: Review of patient past medical history, allergies, medications, health status, including review of consultants reports, laboratory and other test data, was performed as part of comprehensive evaluation and provision of chronic care management services.   SDOH:  (Social Determinants of Health)  assessments and interventions performed: {yes/no:20286}  SDOH Screenings   Alcohol Screen: Not on file  Depression (PHQ2-9): Not on file  Financial Resource Strain: Not on file  Food Insecurity: Not on file  Housing: Not on file  Physical Activity: Not on file  Social Connections: Not on file  Stress: Not on file  Tobacco Use: Medium Risk   Smoking Tobacco Use: Former   Smokeless Tobacco Use: Never  Transportation Needs: Not on file    [place]  Allergies  Allergen Reactions   Dyphylline-Guaifenesin    Erythromycin Nausea Only   Gabapentin     Affected memory   Mevacor [Lovastatin]     myalgias   Other     CT  scan dye    Medications Reviewed Today     Reviewed by Binnie Rail, MD (Physician) on 05/20/20 at 1312  Med List Status: <None>   Medication Order Taking? Sig Documenting Provider Last Dose Status Informant  amLODipine (NORVASC) 5 MG tablet [id number] Yes Take 1 tablet (5 mg total) by mouth daily. Binnie Rail, MD Taking Active   atorvastatin (LIPITOR) 20 MG tablet [id number] Yes TAKE 1 TABLET DAILY        (DISCONTINUED CARBAMAZEPINECAP 100MG ER) Binnie Rail, MD Taking Active   b complex vitamins tablet [id number] Yes Take 1 tablet by mouth daily. [provider] Taking Active   Biotin 10000 MCG TABS [id number] Yes See admin instructions. [provider] Taking Active   cholecalciferol (VITAMIN D) 1000 UNITS tablet [id number] Yes Take 400 Units by mouth daily. [provider] Taking Active   etodolac (LODINE XL) 400 MG 24 hr tablet [id number] Yes TAKE 1 TABLET DAILY Burns, Claudina Lick, MD Taking Active   famotidine (PEPCID) 20 MG tablet [id number] Yes TAKE 1 TABLET BY MOUTH EVERY DAY Burns, Claudina Lick, MD Taking Active   hydrocortisone-pramoxine [facility]) 2.5-1 % rectal cream [id number] Yes Place 1 application rectally 3 (three) times daily. Janne Napoleon, NP Taking Active   latanoprost (XALATAN) 0.005 % ophthalmic solution [id number] Yes   [provider] Taking Active   meclizine (ANTIVERT) 25 MG tablet [id number] Yes Take 1 tablet (25 mg total) by mouth 3 (three) times daily as needed for dizziness. Binnie Rail, MD Taking Active   OXcarbazepine (TRILEPTAL) 150 MG tablet [id number] Yes Take 1 tablet (150 mg total) by mouth 2 (two) times daily. Melvenia Beam, MD Taking Active   Pramoxine-HC (HYDROCORTISONE ACE-PRAMOXINE) 2.5-1 % CREA [id number] Yes Place rectally. [provider] Taking Active   PROVENTIL HFA 108 [phone] Base) MCG/ACT inhaler [id number] Yes INHALE 1-2 PUFFS INTO THE LUNGS EVERY 6 (SIX) HOURS AS NEEDED FOR WHEEZING. Binnie Rail, MD Taking Active   SYNTHROID 112 MCG tablet [id number] Yes TAKE 1 TABLET BY MOUTH EVERY DAY Burns, Claudina Lick, MD Taking Active   telmisartan (MICARDIS) 80 MG tablet [id number] Yes Take 1 tablet (80 mg total) by mouth daily. Binnie Rail, MD Taking Active   UNABLE TO FIND [id number] Yes at bedtime. Osteo-ease [provider] Taking Active Self  vitamin E 200 UNIT capsule [id number] Yes Take 200 Units by mouth daily. [provider] Taking Active   zoledronic acid (RECLAST) 5 MG/100ML SOLN injection [id number] Yes See admin instructions. [provider] Taking Active             Patient Active Problem List   Diagnosis Date Noted   Fall 05/20/2020   Disorder of right trigeminal nerve 12/31/2019   Poor balance 10/23/2019   Trigeminal neuralgia 04/20/2019   Right carpal tunnel syndrome 04/12/2018   Hyperparathyroidism ([place]) 04/10/2018   Diverticulitis 10/12/2017   Prediabetes 10/11/2017   Squamous cell skin cancer 04/01/2016   Hiatal hernia, large 09/22/2015   Pain in lower limb 03/04/2015   Edema 11/26/2013   Parotid tumor 03/23/2013   Dyslipidemia    Onychomycosis due to dermatophyte 05/05/2012   Osteoporosis, post-menopausal    GERD (gastroesophageal reflux disease)    Benign hypertensive heart disease without heart failure 04/28/2011    Osteoarthritis 01/08/2011   Hypothyroidism 02/07/2009   Essential hypertension 02/07/2009   ALLERGIC RHINITIS 02/07/2009   Intrinsic asthma 02/07/2009    Immunization History  Administered Date(s) Administered   [employer]  Quad(high Dose 65+) 10/16/2018, 10/23/2019   Influenza Whole 11/08/2013   Influenza, High Dose Seasonal PF 10/12/2017   Influenza,inj,Quad PF,6+ Mos 10/25/2012   Influenza-Unspecified 10/26/2011, 11/04/2014, 11/04/2015, 10/25/2016   PFIZER Comirnaty(Gray Top)Covid-19 Tri-Sucrose Vaccine 06/03/2020   PFIZER(Purple Top)SARS-COV-2 Vaccination 03/22/2019, 04/17/2019   Pneumococcal Conjugate-13 11/26/2014   Pneumococcal Polysaccharide-23 01/25/2005, 08/01/2018   Td 01/25/2006   Tdap 09/29/2016   Zoster, Live 01/25/2009    Conditions to be addressed/monitored:  {USCCMDZASSESSMENTOPTIONS:23563}  There are no care plans that you recently modified to display for this patient.     Medication Assistance: {MEDASSISTANCEINFO:25044}  Compliance/Adherence/Medication fill history: Care Gaps: ***  Patient's preferred pharmacy is:  [id number] [place] [place] [address]Phone: [phone]: [phone] [facility] [place]: [phone]: [phone]  Uses pill box? {Yes or If no, why not?:20788} Pt endorses ***% compliance  Care Plan and Follow Up Patient Decision:  {FOLLOWUP:24991}  Plan: {CM FOLLOW UP PBWLS:93734} ***  Current Barriers:  {pharmacybarriers:24917}  Pharmacist Clinical Goal(s):  Patient will {PHARMACYGOALCHOICES:24921} through collaboration with PharmD and provider.   Interventions: 1:1 collaboration with BBinnie Rail MD regarding development and update of comprehensive plan of care as evidenced by provider attestation and  co-signature Inter-disciplinary care team collaboration (see longitudinal plan of care) Comprehensive medication review performed; medication list updated in electronic medical record  Hypertension (BP goal <140/90) -{US controlled/uncontrolled:25276} -Current treatment: Telmisartan 838m- 1 tablet daily  Amlodipine 22m19m 1 tablet daily  -Medications previously tried: losartan, hctz  -Current home readings: *** -Current dietary habits: *** -Current exercise habits: *** -{ACTIONS;DENIES/REPORTS:21021675::"Denies"} hypotensive/hypertensive symptoms -Educated on {CCM BP Counseling:25124} -Counseled to monitor BP at home ***, document, and provide log at future appointments -{CCMPHARMDINTERVENTION:25122}  Hyperlipidemia: (LDL goal < 100) -{US controlled/uncontrolled:25276} Lab Results  Component Value Date   LDLCALC 114 (H) 05/20/2020  -Current treatment: Atorvastatin 54m47m1 tablet daily  -Medications previously tried: n/a  -Current dietary patterns: *** -Current exercise habits: *** -Educated on {CCM HLD Counseling:25126} -{CCMPHARMDINTERVENTION:25122}  Osteoporosis / Osteopenia (Goal ***) -{US controlled/uncontrolled:25276} -Last DEXA Scan: 07/13/2018   T-Score femoral neck: -1.8  T-Score left forearm radius: -3.1  10-year probability of major osteoporotic fracture: ***  10-year probability of hip fracture: *** -Patient {is;is not an osteoporosis candidate:23886} -Current treatment  Zoledronic acid (Reclast) 22mg/28mmL -  Vitamin D 1000 units - 1 tablet daily  -Medications previously tried: ***  -{Osteoporosis Counseling:23892} -{CCMPHARMDINTERVENTION:25122}  GERD (Goal: Acid control/ prevention of reflux) -{US controlled/uncontrolled:25276} -Current treatment  Famotidine 54mg 31mtablet twice daily  -Medications previously tried: n/a  -{CCMPHARMDINTERVENTION:25122}  Trigeminal Neuralgia (Goal: Pain Control) -{US controlled/uncontrolled:25276} -Current treatment   Oxcarbazepine 150mg -2mablet twcie daily  -Medications previously tried: gabapentin, carbamazepine   -{CCMPHARMDINTERVENTION:25122}  Hypothyroidism (Goal: Maintenance of euthyroid levels) -{US controlled/uncontrolled:25276} Lab Results  Component Value Date   TSH 3.26 05/20/2020  -Current treatment  Synthroid 112mcg -62mablet daily  -Medications previously tried: levothyroxine -{CCMPHARMDINTERVENTION:25122}  Glaucoma (Goal: Maintenance of appropriate occular pressure) -{US controlled/uncontrolled:25276} -Current treatment  Latanoprost 0.005% - 1 drop to each eye nightly  -Medications previously tried: n/a  -{CCMPHARMDINTERVENTION:25122}  Asthma(Goal: control symptoms and prevent exacerbations) -{US controlled/uncontrolled:25276} -Current treatment  Albuterol 108mcg/ac31m2 puffs every 6 hours as needed  -Medications previously tried: advair, albuterol nebulizer solution   -Exacerbations requiring  treatment in last 6 months: *** -Patient {Actions; denies-reports:120008} consistent use of maintenance inhaler -Frequency of rescue inhaler use: *** -Counseled on {CCMINHALERCOUNSELING:25121} -{CCMPHARMDINTERVENTION:25122}  Osteoathritis (Goal: Pain control) -{US controlled/uncontrolled:25276} -Current treatment  Etodolac 424m - 1 tablet daily  -Medications previously tried: n/a  -{CCMPHARMDINTERVENTION:25122}   Health Maintenance -Vaccine gaps: *** -Current therapy:  Biotin 100083m - 1 tablet daily  Vitamin D complex - 1 tablet daily  Vitamin E 200 units - 1 capsule daily  -Educated on {ccm supplement counseling:25128} -{CCM Patient satisfied:25129} -{CCMPHARMDINTERVENTION:25122}  Patient Goals/Self-Care Activities Patient will:  - {pharmacypatientgoals:24919}  Follow Up Plan: {CM FOLLOW UP PLXKGY:18563}Current Chart Prep = 20 minutes

## 2020-09-17 ENCOUNTER — Ambulatory Visit (INDEPENDENT_AMBULATORY_CARE_PROVIDER_SITE_OTHER): Payer: Medicare Other | Admitting: Podiatry

## 2020-09-17 ENCOUNTER — Other Ambulatory Visit: Payer: Self-pay

## 2020-09-17 DIAGNOSIS — L989 Disorder of the skin and subcutaneous tissue, unspecified: Secondary | ICD-10-CM

## 2020-09-17 DIAGNOSIS — M79676 Pain in unspecified toe(s): Secondary | ICD-10-CM | POA: Diagnosis not present

## 2020-09-17 DIAGNOSIS — B351 Tinea unguium: Secondary | ICD-10-CM

## 2020-09-17 NOTE — Progress Notes (Signed)
   SUBJECTIVE Patient presents to office today complaining of elongated, thickened nails that cause pain while ambulating in shoes. She is unable to trim her own nails. Patient is here for further evaluation and treatment.  Past Medical History:  Diagnosis Date   ALLERGIC RHINITIS    Anemia    Asthma    Asthma    Cataract    GERD (gastroesophageal reflux disease)    with HH   History of renal calculi    Hypertension    Hypothyroidism    Osteoarthritis    Parotid tumor    L side, s/p resection 1980 and 2007    OBJECTIVE General Patient is awake, alert, and oriented x 3 and in no acute distress. Derm Skin is dry and supple bilateral. Negative open lesions or macerations. Remaining integument unremarkable. Nails are tender, long, thickened and dystrophic with subungual debris, consistent with onychomycosis, 1-5 bilateral. No signs of infection noted.  There is some diffuse hyperkeratotic skin tissue noted to the distal tips of the toes bilateral Vasc  DP and PT pedal pulses palpable bilaterally. Temperature gradient within normal limits.  Neuro Epicritic and protective threshold sensation grossly intact bilaterally.  Musculoskeletal Exam No symptomatic pedal deformities noted bilateral. Muscular strength within normal limits.  Clinical evidence of a hallux valgus deformity bilateral with lateral deviation of the great toes  ASSESSMENT 1. Onychodystrophic nails 1-5 bilateral with hyperkeratosis of nails.  2. Onychomycosis of nail due to dermatophyte bilateral 3.  Preulcerative callus lesions bilateral feet  4.  Pain in foot bilateral 5.  Hallux valgus bilateral  PLAN OF CARE 1. Patient evaluated today.  2. Instructed to maintain good pedal hygiene and foot care.  3. Mechanical debridement of nails 1-5 bilaterally performed using a nail nipper. Filed with dremel without incident.  4.  Excisional debridement of the hyperkeratotic preulcerative callus lesions was performed using a  tissue nipper without incident or bleeding  5.  Today we did discuss the hallux valgus and bunion deformities to the bilateral feet.  Currently the patient wears wide fitting shoes that do not aggravate her bunions.  Continue wearing good supportive shoes that are wide fitting.  Recommend conservative treatment versus surgical. 6.  Return to clinic in 3 months   Edrick Kins, DPM [facility]  Dr. Edrick Kins, [place]                                        [place], [place] [address]                Office [phone]  Fax [phone]

## 2020-09-25 ENCOUNTER — Telehealth: Payer: Self-pay | Admitting: Internal Medicine

## 2020-09-25 DIAGNOSIS — H40053 Ocular hypertension, bilateral: Secondary | ICD-10-CM | POA: Diagnosis not present

## 2020-09-25 DIAGNOSIS — H35372 Puckering of macula, left eye: Secondary | ICD-10-CM | POA: Diagnosis not present

## 2020-09-25 DIAGNOSIS — H40013 Open angle with borderline findings, low risk, bilateral: Secondary | ICD-10-CM | POA: Diagnosis not present

## 2020-09-25 DIAGNOSIS — H2513 Age-related nuclear cataract, bilateral: Secondary | ICD-10-CM | POA: Diagnosis not present

## 2020-09-26 MED ORDER — SYNTHROID 112 MCG PO TABS: 112.0000 ug | ORAL_TABLET | Freq: Every day | ORAL | 1 refills | Status: DC

## 2020-09-26 NOTE — Telephone Encounter (Signed)
Reviewed chart pt is up-to-date sent refills to [facility]  

## 2020-10-01 ENCOUNTER — Ambulatory Visit
Admission: RE | Admit: 2020-10-01 | Discharge: 2020-10-01 | Disposition: A | Discharge status: Home / Self Care | Payer: Medicare Other | Source: Ambulatory Visit | Attending: Endocrinology | Admitting: Endocrinology

## 2020-10-01 ENCOUNTER — Other Ambulatory Visit: Payer: Self-pay

## 2020-10-01 DIAGNOSIS — Z1231 Encounter for screening mammogram for malignant neoplasm of breast: Secondary | ICD-10-CM

## 2020-10-01 DIAGNOSIS — Z780 Asymptomatic menopausal state: Secondary | ICD-10-CM | POA: Diagnosis not present

## 2020-10-01 DIAGNOSIS — M810 Age-related osteoporosis without current pathological fracture: Secondary | ICD-10-CM

## 2020-10-21 ENCOUNTER — Other Ambulatory Visit: Payer: Self-pay | Admitting: Internal Medicine

## 2020-10-27 DIAGNOSIS — Z23 Encounter for immunization: Secondary | ICD-10-CM | POA: Diagnosis not present

## 2020-11-03 ENCOUNTER — Other Ambulatory Visit: Payer: Self-pay | Admitting: Neurology

## 2020-11-06 ENCOUNTER — Telehealth: Payer: Self-pay | Admitting: Internal Medicine

## 2020-11-06 NOTE — Telephone Encounter (Signed)
Left message for patient to call me back at [phone] to schedule Medicare Annual Wellness Visit   Last AWV  09/22/16  Please schedule at anytime with LB Bailey if patient calls the office back.    40 Minutes appointment   Any questions, please call me at [phone]

## 2020-11-17 NOTE — Patient Instructions (Addendum)
    Blood work was ordered.     Medications changes include :  none   Your prescription(s) have been submitted to your pharmacy. Please take as directed and contact our office if you believe you are having problem(s) with the medication(s).   Please followup in 6 months  

## 2020-11-17 NOTE — Progress Notes (Signed)
Subjective:    Patient ID: Anna Adams, female    DOB: [birth year], 85 y.o.   MRN: [id number]  This visit occurred during the SARS-CoV-2 public health emergency.  Safety protocols were in place, including screening questions prior to the visit, additional usage of staff PPE, and extensive cleaning of exam room while observing appropriate contact time as indicated for disinfecting solutions.     HPI The patient is here for follow up of their chronic medical problems, including htn, hypothyroid, hld, prediabetes, gerd, OA, R sided trigeminal pain  Her left shoulder pain is terrible - she has an appt this Friday for an injection, which she has had in the past.    She has not scheduled any grief sessions.    Medications and allergies reviewed with patient and updated if appropriate.  Patient Active Problem List   Diagnosis Date Noted   Fall 05/20/2020   Disorder of right trigeminal nerve 12/31/2019   Poor balance 10/23/2019   Trigeminal neuralgia 04/20/2019   Right carpal tunnel syndrome 04/12/2018   Hyperparathyroidism ([place]) - Dr Chalmers Cater 04/10/2018   Diverticulitis 10/12/2017   Prediabetes 10/11/2017   Squamous cell skin cancer 04/01/2016   Hiatal hernia, large 09/22/2015   Pain in lower limb 03/04/2015   Edema 11/26/2013   Parotid tumor 03/23/2013   Dyslipidemia    Onychomycosis due to dermatophyte 05/05/2012   Osteoporosis, post-menopausal - Dr Chalmers Cater    GERD (gastroesophageal reflux disease)    Benign hypertensive heart disease without heart failure 04/28/2011   Osteoarthritis 01/08/2011   Hypothyroidism - Dr Chalmers Cater 02/07/2009   Essential hypertension 02/07/2009   ALLERGIC RHINITIS 02/07/2009   Intrinsic asthma 02/07/2009    Current Outpatient Medications on File Prior to Visit  Medication Sig Dispense Refill   amLODipine (NORVASC) 5 MG tablet TAKE 1 TABLET BY MOUTH EVERY DAY 90 tablet 3   atorvastatin (LIPITOR) 20 MG tablet TAKE 1 TABLET DAILY        (DISCONTINUED  CARBAMAZEPINECAP 100MG  ER) 90 tablet 1   b complex vitamins tablet Take 1 tablet by mouth daily.     Biotin 10000 MCG TABS See admin instructions.     cholecalciferol (VITAMIN D) 1000 UNITS tablet Take 400 Units by mouth daily.     etodolac (LODINE XL) 400 MG 24 hr tablet TAKE 1 TABLET DAILY 90 tablet 1   famotidine (PEPCID) 20 MG tablet Take 1 tablet (20 mg total) by mouth 2 (two) times daily. 180 tablet 1   hydrocortisone-pramoxine (ANALPRAM HC) 2.5-1 % rectal cream Place 1 application rectally 3 (three) times daily. 30 g 0   latanoprost (XALATAN) 0.005 % ophthalmic solution      meclizine (ANTIVERT) 25 MG tablet Take 1 tablet (25 mg total) by mouth 3 (three) times daily as needed for dizziness. 30 tablet 5   OXcarbazepine (TRILEPTAL) 150 MG tablet TAKE 1 TABLET BY MOUTH TWICE A DAY 180 tablet 0   Pramoxine-HC (HYDROCORTISONE ACE-PRAMOXINE) 2.5-1 % CREA Place rectally.     PROVENTIL HFA 108 (90 Base) MCG/ACT inhaler INHALE 1-2 PUFFS INTO THE LUNGS EVERY 6 (SIX) HOURS AS NEEDED FOR WHEEZING. 6.7 Inhaler 7   SYNTHROID 112 MCG tablet Take 1 tablet (112 mcg total) by mouth daily. 90 tablet 1   telmisartan (MICARDIS) 80 MG tablet Take 1 tablet (80 mg total) by mouth daily. 90 tablet 1   UNABLE TO FIND at bedtime. Osteo-ease     vitamin E 200 UNIT capsule Take 200 Units by  mouth daily.     zoledronic acid (RECLAST) 5 MG/100ML SOLN injection See admin instructions.     No current facility-administered medications on file prior to visit.    Past Medical History:  Diagnosis Date   ALLERGIC RHINITIS    Anemia    Asthma    Asthma    Cataract    GERD (gastroesophageal reflux disease)    with HH   History of renal calculi    Hypertension    Hypothyroidism    Osteoarthritis    Parotid tumor    L side, s/p resection 1980 and 2007    Past Surgical History:  Procedure Laterality Date   CARDIOVASCULAR STRESS TEST  07/12/2005   EF 85%   CERVICAL DISCECTOMY  [place]   (L) wrist x's 2   KNEE ARTHROPLASTY  04/22/2006   DR. Camelia Phenes [facility]   Left knee replacement  2005   LUMBAR [place] SURGERY     PAROTID GLAND TUMOR EXCISION  1980, 2007   Left   right knee replacement  2008   TONSILLECTOMY AND ADENOIDECTOMY     TUBAL LIGATION      Social History   Socioeconomic History   Marital status: Widowed    Spouse name: Not on file   Number of children: 2   Years of education: Not on file   Highest education level: Not on file  Occupational History   Not on file  Tobacco Use   Smoking status: Former    Types: Cigarettes    Quit date: 09/01/1962    Years since quitting: 58.2   Smokeless tobacco: Never  Substance and Sexual Activity   Alcohol use: No    Alcohol/week: 0.0 standard drinks   Drug use: No   Sexual activity: Not on file  Other Topics Concern   Not on file  Social History Narrative   Recently widowed, 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, retired Therapist, sports   Right handed   Caffeine: up to 4-5 cups/day (coffee and tea)   Social Determinants of Radio broadcast assistant Strain: Not on file  Food Insecurity: Not on file  Transportation Needs: Not on file  Physical Activity: Not on file  Stress: Not on file  Social Connections: Not on file    Family History  Problem Relation Age of Onset   Asthma Mother    Cancer Mother    Heart disease Mother    Hypertension Father    Heart disease Father    Hyperlipidemia Father    Stroke Father    Mental illness Father    Stroke Maternal Grandmother    Heart disease Maternal Grandfather    Hypertension Paternal Grandfather     Review of Systems  Constitutional:  Negative for fever.  Respiratory:  Negative for cough, shortness of breath and wheezing (rare).   Cardiovascular:  Positive for leg swelling. Negative for chest pain and palpitations.  Gastrointestinal:  Positive for nausea (rare). Negative for abdominal pain.  Neurological:  Negative for dizziness, light-headedness and headaches.        Objective:   Vitals:   11/18/20 1307  BP: 126/80  Pulse: 71  Temp: 98.2 F (36.8 C)  SpO2: 95%   BP Readings from Last 3 Encounters:  11/18/20 126/80  05/20/20 126/72  12/31/19 125/70   Wt Readings from Last 3 Encounters:  11/18/20 159 lb (72.1 kg)  05/20/20 163 lb 12.8 oz (74.3 kg)  12/31/19 162  lb (73.5 kg)   Body mass index is 32.11 kg/m.   Physical Exam    Constitutional: Appears well-developed and well-nourished. No distress.  HENT:  Head: Normocephalic and atraumatic.  Neck: Neck supple. No tracheal deviation present. No thyromegaly present.  No cervical lymphadenopathy Cardiovascular: Normal rate, regular rhythm and normal heart sounds.   No murmur heard. No carotid bruit .  No edema Pulmonary/Chest: Effort normal and breath sounds normal. No respiratory distress. No has no wheezes. No rales.  Skin: Skin is warm and dry. Not diaphoretic.  Psychiatric: Normal mood and affect. Behavior is normal.      Assessment & Plan:    See Problem List for Assessment and Plan of chronic medical problems.

## 2020-11-18 ENCOUNTER — Other Ambulatory Visit: Payer: Self-pay

## 2020-11-18 ENCOUNTER — Ambulatory Visit (INDEPENDENT_AMBULATORY_CARE_PROVIDER_SITE_OTHER): Payer: Medicare Other | Admitting: Internal Medicine

## 2020-11-18 ENCOUNTER — Encounter: Payer: Self-pay | Admitting: Internal Medicine

## 2020-11-18 VITALS — BP 126/80 | HR 71 | Temp 98.2°F | Ht 59.0 in | Wt 159.0 lb

## 2020-11-18 DIAGNOSIS — R7303 Prediabetes: Secondary | ICD-10-CM

## 2020-11-18 DIAGNOSIS — K219 Gastro-esophageal reflux disease without esophagitis: Secondary | ICD-10-CM

## 2020-11-18 DIAGNOSIS — E039 Hypothyroidism, unspecified: Secondary | ICD-10-CM

## 2020-11-18 DIAGNOSIS — G500 Trigeminal neuralgia: Secondary | ICD-10-CM

## 2020-11-18 DIAGNOSIS — M159 Polyosteoarthritis, unspecified: Secondary | ICD-10-CM

## 2020-11-18 DIAGNOSIS — E785 Hyperlipidemia, unspecified: Secondary | ICD-10-CM

## 2020-11-18 DIAGNOSIS — I10 Essential (primary) hypertension: Secondary | ICD-10-CM | POA: Diagnosis not present

## 2020-11-18 LAB — LIPID PANEL
Cholesterol: 197 mg/dL (ref 0–200)
HDL: 94.8 mg/dL (ref >39.00)
LDL Cholesterol: 88 mg/dL (ref 0–99)
NonHDL: 101.79
Total CHOL/HDL Ratio: 2
Triglycerides: 70 mg/dL (ref 0.0–149.0)
VLDL: 14 mg/dL (ref 0.0–40.0)

## 2020-11-18 LAB — COMPREHENSIVE METABOLIC PANEL
ALT: 11 U/L (ref 0–35)
AST: 25 U/L (ref 0–37)
Albumin: 4.7 g/dL (ref 3.5–5.2)
Alkaline Phosphatase: 65 U/L (ref 39–117)
BUN: 15 mg/dL (ref 6–23)
CO2: 31 mEq/L (ref 19–32)
Calcium: 10.9 mg/dL — ABNORMAL HIGH (ref 8.4–10.5)
Chloride: 97 mEq/L (ref 96–112)
Creatinine, Ser: 0.81 mg/dL (ref 0.40–1.20)
GFR: 66.5 mL/min (ref >60.00)
Glucose, Bld: 77 mg/dL (ref 70–99)
Potassium: 4.3 mEq/L (ref 3.5–5.1)
Sodium: 136 mEq/L (ref 135–145)
Total Bilirubin: 0.9 mg/dL (ref 0.2–1.2)
Total Protein: 7.2 g/dL (ref 6.0–8.3)

## 2020-11-18 LAB — HEMOGLOBIN A1C: Hgb A1c MFr Bld: 5.6 % (ref 4.6–6.5)

## 2020-11-18 MED ORDER — ATORVASTATIN CALCIUM 20 MG PO TABS: 20.0000 mg | ORAL_TABLET | Freq: Every day | ORAL | 1 refills | Status: DC

## 2020-11-18 NOTE — Assessment & Plan Note (Signed)
Chronic GERD controlled Continue famotidine 20 mg bid  

## 2020-11-18 NOTE — Assessment & Plan Note (Signed)
Chronic Management per Dr Chalmers Cater

## 2020-11-18 NOTE — Addendum Note (Signed)
Addended by: Boris Lown B on: 11/18/2020 01:53 PM   Modules accepted: Orders

## 2020-11-18 NOTE — Assessment & Plan Note (Signed)
Chronic Taking trileptal 150 mg twice daily Management per Jaynee Eagles

## 2020-11-18 NOTE — Assessment & Plan Note (Signed)
Chronic Check a1c Low sugar / carb diet Stressed regular exercise  

## 2020-11-18 NOTE — Assessment & Plan Note (Addendum)
Chronic Continue etodolac 400 mg daily 

## 2020-11-18 NOTE — Assessment & Plan Note (Signed)
Chronic BP well controlled Continue amlodipine 5 mg daily, telmisartan 80 mg daily cmp  

## 2020-11-18 NOTE — Assessment & Plan Note (Signed)
Chronic Check lipid panel  Continue atorvastatin 20 mg daily Regular exercise and healthy diet encouraged  

## 2020-11-21 ENCOUNTER — Other Ambulatory Visit: Payer: Self-pay

## 2020-11-21 ENCOUNTER — Ambulatory Visit (INDEPENDENT_AMBULATORY_CARE_PROVIDER_SITE_OTHER): Payer: Medicare Other | Admitting: Surgical

## 2020-11-21 ENCOUNTER — Other Ambulatory Visit: Payer: Self-pay | Admitting: Neurology

## 2020-11-21 ENCOUNTER — Encounter: Payer: Self-pay | Admitting: Surgical

## 2020-11-21 DIAGNOSIS — M19012 Primary osteoarthritis, left shoulder: Secondary | ICD-10-CM

## 2020-11-21 DIAGNOSIS — M19011 Primary osteoarthritis, right shoulder: Secondary | ICD-10-CM | POA: Diagnosis not present

## 2020-11-21 DIAGNOSIS — M25512 Pain in left shoulder: Secondary | ICD-10-CM

## 2020-11-21 DIAGNOSIS — M25511 Pain in right shoulder: Secondary | ICD-10-CM | POA: Diagnosis not present

## 2020-11-23 MED ORDER — LIDOCAINE HCL 1 % IJ SOLN
5.0000 mL | INTRAMUSCULAR | Status: AC | PRN
  Administered 2020-11-21: 5 mL

## 2020-11-23 MED ORDER — METHYLPREDNISOLONE ACETATE 40 MG/ML IJ SUSP
40.0000 mg | INTRAMUSCULAR | Status: AC | PRN
  Administered 2020-11-21: 40 mg via INTRA_ARTICULAR

## 2020-11-23 MED ORDER — BUPIVACAINE HCL 0.25 % IJ SOLN
9.0000 mL | INTRAMUSCULAR | Status: AC | PRN
  Administered 2020-11-21: 16:00:00 9 mL via INTRA_ARTICULAR

## 2020-11-23 MED ORDER — BUPIVACAINE HCL 0.25 % IJ SOLN
9.0000 mL | INTRAMUSCULAR | Status: AC | PRN
Start: 2020-11-21 — End: 2020-11-21
  Administered 2020-11-21: 16:00:00 9 mL via INTRA_ARTICULAR

## 2020-11-23 NOTE — Progress Notes (Signed)
Office Visit Note   Patient: Anna Adams           Date of Birth: [birth year]           MRN: [id number] Visit Date: 11/21/2020 Requested by: Binnie Rail, MD [place],  [place] [address] PCP: Binnie Rail, MD  Subjective: Chief Complaint  Patient presents with   Right Shoulder - Follow-up   Left Shoulder - Follow-up    HPI: Anna Adams is a 86 y.o. female who presents to the office complaining of bilateral shoulder pain.  Patient has history of bilateral shoulder osteoarthritis with last left shoulder injection on 05/05/2020 and last right shoulder injection in 2021.  She states that the left shoulder in particular is getting worse and worse.  She had her last injection gave relief for about 4 to 5 months.  She is ambulating with a cane.  Using over-the-counter Tylenol for pain control.  She has good range of motion and good function of each shoulder but the main complaint is pain.  She wakes with pain most nights.  No difficulty lifting her arms up.  Denies any radicular pain or significant neck pain.  No numbness or tingling..                ROS: All systems reviewed are negative as they relate to the chief complaint within the history of present illness.  Patient denies fevers or chills.  Assessment & Plan: Visit Diagnoses:  1. Bilateral shoulder region arthritis     Plan: Patient is a 86 year old female who presents complaining of bilateral shoulder pain.  She has history of bilateral shoulder osteoarthritis which has been successfully improved for several months at a time with previous injections.  She would like to try having both shoulders injected today.  She does not have any history of diabetes.  She tolerated both injections well initially but after getting up and walking out to the clinic waiting room, she noted a little bit of dizziness.  She sat down and felt better.  Denied any chest pain or shortness of breath or any palpitations throughout this episode.   She did not have any other complaints aside from the little bit of dizziness.  Vital signs were checked which showed systolic blood pressure of around 140, O2 sat of 97%, heart rate of 56.  After 10 to 15 minutes of sitting in the waiting room, she was able to walk around the waiting room without any dizziness or any symptoms.  She was able to walk out of the clinic without any difficulty.  Plan to follow-up as needed and will plan to stick with just 1 cortisone injection at a time in the future.  Follow-Up Instructions: No follow-ups on file.   Orders:  No orders of the defined types were placed in this encounter.  No orders of the defined types were placed in this encounter.     Procedures: Large Joint Inj: bilateral glenohumeral on 11/21/2020 3:39 PM Indications: diagnostic evaluation and pain Details: 18 G 1.5 in needle, posterior approach  Arthrogram: No  Medications (Right): 5 mL lidocaine 1 %; 9 mL bupivacaine 0.25 %; 40 mg methylPREDNISolone acetate 40 MG/ML Medications (Left): 5 mL lidocaine 1 %; 9 mL bupivacaine 0.25 %; 40 mg methylPREDNISolone acetate 40 MG/ML Outcome: tolerated well, no immediate complications Procedure, treatment alternatives, risks and benefits explained, specific risks discussed. Consent was given by the patient. Immediately prior to procedure a time out was  called to verify the correct patient, procedure, equipment, support staff and site/side marked as required. Patient was prepped and draped in the usual sterile fashion.      Clinical Data: No additional findings.  Objective: Vital Signs: There were no vitals taken for this visit.  Physical Exam:  Constitutional: Patient appears well-developed HEENT:  Head: Normocephalic Eyes:EOM are normal Neck: Normal range of motion Cardiovascular: Normal rate Pulmonary/chest: Effort normal Neurologic: Patient is alert Skin: Skin is warm Psychiatric: Patient has normal mood and affect  Ortho Exam:  Ortho exam demonstrates right shoulder with 60 degrees external rotation, 90 degrees abduction, 180 degrees forward flexion.  This compared with the left shoulder with 50 degrees X rotation, 90 degrees abduction, 170 degrees forward flexion.  Excellent rotator cuff strength of the left shoulder rated 5/5 of supra, infra, subscap.  5/5 motor strength of right-sided supraspinatus and subscapularis with 4/5 motor strength of external rotation.  No tenderness over the [facility] joint bilaterally.  Moderate tenderness of the bicipital groove bilaterally.  Active range of motion equivalent to passive range of motion for both shoulders.  5/5 motor strength of bilateral grip strength, finger abduction, pronation/supination, bicep, tricep, deltoid.  No tenderness throughout the axial cervical spine.  Negative Lhermitte sign.  Negative Spurling sign.  Negative Hornblower sign bilaterally.  Negative external rotation lag sign bilaterally.  Specialty Comments:  No specialty comments available.  Imaging: No results found.   PMFS History: Patient Active Problem List   Diagnosis Date Noted   Fall 05/20/2020   Disorder of right trigeminal nerve 12/31/2019   Poor balance 10/23/2019   Trigeminal neuralgia 04/20/2019   Right carpal tunnel syndrome 04/12/2018   Hyperparathyroidism ([facility]) - Dr Chalmers Cater 04/10/2018   Diverticulitis 10/12/2017   Prediabetes 10/11/2017   Squamous cell skin cancer 04/01/2016   Hiatal hernia, large 09/22/2015   Pain in lower limb 03/04/2015   Edema 11/26/2013   Parotid tumor 03/23/2013   Dyslipidemia    Onychomycosis due to dermatophyte 05/05/2012   Osteoporosis, post-menopausal - Dr Chalmers Cater    GERD (gastroesophageal reflux disease)    Benign hypertensive heart disease without heart failure 04/28/2011   Osteoarthritis 01/08/2011   Hypothyroidism - Dr Chalmers Cater 02/07/2009   Essential hypertension 02/07/2009   ALLERGIC RHINITIS 02/07/2009   Intrinsic asthma 02/07/2009   Past Medical History:   Diagnosis Date   ALLERGIC RHINITIS    Anemia    Asthma    Asthma    Cataract    GERD (gastroesophageal reflux disease)    with HH   History of renal calculi    Hypertension    Hypothyroidism    Osteoarthritis    Parotid tumor    L side, s/p resection 1980 and 2007    Family History  Problem Relation Age of Onset   Asthma Mother    Cancer Mother    Heart disease Mother    Hypertension Father    Heart disease Father    Hyperlipidemia Father    Stroke Father    Mental illness Father    Stroke Maternal Grandmother    Heart disease Maternal Grandfather    Hypertension Paternal Grandfather     Past Surgical History:  Procedure Laterality Date   CARDIOVASCULAR STRESS TEST  07/12/2005   EF 85%   CERVICAL DISCECTOMY  [place]   (L) wrist x's 2   KNEE ARTHROPLASTY  04/22/2006   DR. Camelia Phenes [facility]   Left knee replacement  2005   [place]     PAROTID GLAND TUMOR EXCISION  1980, 2007   Left   right knee replacement  2008   TONSILLECTOMY AND ADENOIDECTOMY     TUBAL LIGATION     Social History   Occupational History   Not on file  Tobacco Use   Smoking status: Former    Types: Cigarettes    Quit date: 09/01/1962    Years since quitting: 58.2   Smokeless tobacco: Never  Substance and Sexual Activity   Alcohol use: No    Alcohol/week: 0.0 standard drinks   Drug use: No   Sexual activity: Not on file

## 2020-11-24 NOTE — Telephone Encounter (Signed)
1.Medication Requested:OXcarbazepine (TRILEPTAL) 150 MG tablet  2. Pharmacy (Name, Street, City):[id number] - [place], [address]  Phone:  [phone] Fax:  [phone]   3. On Med List: yes  4. Last Visit with PCP: 10.25.22  5. Next visit date with PCP: 04.25.23   Agent: Please be advised that RX refills may take up to 3 business days. We ask that you follow-up with your pharmacy.

## 2020-12-09 ENCOUNTER — Other Ambulatory Visit: Payer: Self-pay | Admitting: Internal Medicine

## 2020-12-22 ENCOUNTER — Other Ambulatory Visit: Payer: Self-pay

## 2020-12-22 ENCOUNTER — Ambulatory Visit: Payer: Medicare Other | Admitting: Podiatry

## 2020-12-22 ENCOUNTER — Ambulatory Visit (INDEPENDENT_AMBULATORY_CARE_PROVIDER_SITE_OTHER): Payer: Medicare Other | Admitting: Podiatry

## 2020-12-22 DIAGNOSIS — M79676 Pain in unspecified toe(s): Secondary | ICD-10-CM | POA: Diagnosis not present

## 2020-12-22 DIAGNOSIS — B351 Tinea unguium: Secondary | ICD-10-CM | POA: Diagnosis not present

## 2020-12-22 DIAGNOSIS — L989 Disorder of the skin and subcutaneous tissue, unspecified: Secondary | ICD-10-CM

## 2020-12-29 NOTE — Progress Notes (Signed)
   SUBJECTIVE Patient presents to office today complaining of elongated, thickened nails that cause pain while ambulating in shoes. She is unable to trim her own nails. Patient is here for further evaluation and treatment.  Past Medical History:  Diagnosis Date   ALLERGIC RHINITIS    Anemia    Asthma    Asthma    Cataract    GERD (gastroesophageal reflux disease)    with HH   History of renal calculi    Hypertension    Hypothyroidism    Osteoarthritis    Parotid tumor    L side, s/p resection 1980 and 2007    OBJECTIVE General Patient is awake, alert, and oriented x 3 and in no acute distress. Derm Skin is dry and supple bilateral. Negative open lesions or macerations. Remaining integument unremarkable. Nails are tender, long, thickened and dystrophic with subungual debris, consistent with onychomycosis, 1-5 bilateral. No signs of infection noted.  There is some diffuse hyperkeratotic skin tissue noted to the distal tips of the toes bilateral Vasc  DP and PT pedal pulses palpable bilaterally. Temperature gradient within normal limits.  Neuro Epicritic and protective threshold sensation grossly intact bilaterally.  Musculoskeletal Exam No symptomatic pedal deformities noted bilateral. Muscular strength within normal limits.  Clinical evidence of a hallux valgus deformity bilateral with lateral deviation of the great toes  ASSESSMENT 1. Onychodystrophic nails 1-5 bilateral with hyperkeratosis of nails.  2. Onychomycosis of nail due to dermatophyte bilateral 3.  Preulcerative callus lesions bilateral feet  4.  Pain in foot bilateral 5.  Hallux valgus bilateral  PLAN OF CARE 1. Patient evaluated today.  2. Instructed to maintain good pedal hygiene and foot care.  3. Mechanical debridement of nails 1-5 bilaterally performed using a nail nipper. Filed with dremel without incident.  4.  Excisional debridement of the hyperkeratotic preulcerative callus lesions was performed using a  tissue nipper without incident or bleeding  5.  Today we did discuss the hallux valgus and bunion deformities to the bilateral feet.  Currently the patient wears wide fitting shoes that do not aggravate her bunions.  Continue wearing good supportive shoes that are wide fitting.  Recommend conservative treatment versus surgical. 6.  Return to clinic in 3 months   Edrick Kins, DPM [facility]  Dr. Edrick Kins, DPM    [address], [place] [address]                Office [phone]  Fax [phone]

## 2020-12-30 ENCOUNTER — Telehealth: Payer: Self-pay | Admitting: Internal Medicine

## 2020-12-30 NOTE — Telephone Encounter (Signed)
Patient states she has had back pain x2w  Patient states she has taken Tylenol with little relief  Patient declined an ov and is requesting a call back to discuss other alternative otc medications

## 2020-12-31 NOTE — Telephone Encounter (Signed)
Spoke with patient today and recommendations provided.

## 2020-12-31 NOTE — Telephone Encounter (Signed)
She is already on an anti-inflammatory so can not take advil/aleve.   She can use topical medication, heat, ice.

## 2021-01-03 ENCOUNTER — Ambulatory Visit
Admission: EM | Admit: 2021-01-03 | Discharge: 2021-01-03 | Disposition: A | Discharge status: Home / Self Care | Payer: Medicare Other | Attending: Physician Assistant | Admitting: Physician Assistant

## 2021-01-03 ENCOUNTER — Other Ambulatory Visit: Payer: Self-pay

## 2021-01-03 ENCOUNTER — Encounter: Payer: Self-pay | Admitting: Emergency Medicine

## 2021-01-03 DIAGNOSIS — M545 Low back pain, unspecified: Secondary | ICD-10-CM | POA: Diagnosis not present

## 2021-01-03 DIAGNOSIS — N3000 Acute cystitis without hematuria: Secondary | ICD-10-CM | POA: Diagnosis not present

## 2021-01-03 LAB — POCT URINALYSIS DIP (MANUAL ENTRY)
Blood, UA: NEGATIVE
Glucose, UA: NEGATIVE mg/dL
Ketones, POC UA: NEGATIVE mg/dL
Nitrite, UA: NEGATIVE
Spec Grav, UA: 1.02 (ref 1.010–1.025)
Urobilinogen, UA: 1 E.U./dL
pH, UA: 7.5 (ref 5.0–8.0)

## 2021-01-03 MED ORDER — PROMETHAZINE HCL 25 MG PO TABS: 25.0000 mg | ORAL_TABLET | Freq: Four times a day (QID) | ORAL | 0 refills | Status: DC | PRN

## 2021-01-03 MED ORDER — NITROFURANTOIN MONOHYD MACRO 100 MG PO CAPS: 100.0000 mg | ORAL_CAPSULE | Freq: Two times a day (BID) | ORAL | 0 refills | Status: DC

## 2021-01-03 NOTE — ED Triage Notes (Signed)
Patient c/o low back pain x 2 weeks, no apparent injury.  Patient denies any urinary sx's as well.  Patient has been taken Lodine for pain.

## 2021-01-03 NOTE — ED Provider Notes (Signed)
[facility]    CSN: [id number] Arrival date & time: 01/03/21  0944      History   Chief Complaint Chief Complaint  Patient presents with   Back Pain    HPI Anna Adams is a 86 y.o. female.   Patient here today for evaluation of low back pain this been ongoing for the last 2 weeks.  She states that her pain is in the middle of her back.  She denies any falls or recent injuries.  Has any urinary symptoms.  She is taking Lodine without resolution of symptoms.  The history is provided by the patient and a relative (son).  Back Pain Associated symptoms: no dysuria and no fever    Past Medical History:  Diagnosis Date   ALLERGIC RHINITIS    Anemia    Asthma    Asthma    Cataract    GERD (gastroesophageal reflux disease)    with HH   History of renal calculi    Hypertension    Hypothyroidism    Osteoarthritis    Parotid tumor    L side, s/p resection 1980 and 2007    Patient Active Problem List   Diagnosis Date Noted   Fall 05/20/2020   Disorder of right trigeminal nerve 12/31/2019   Poor balance 10/23/2019   Trigeminal neuralgia 04/20/2019   Right carpal tunnel syndrome 04/12/2018   Hyperparathyroidism ([place]) - Dr Chalmers Cater 04/10/2018   Diverticulitis 10/12/2017   Prediabetes 10/11/2017   Squamous cell skin cancer 04/01/2016   Hiatal hernia, large 09/22/2015   Pain in lower limb 03/04/2015   Edema 11/26/2013   Parotid tumor 03/23/2013   Dyslipidemia    Onychomycosis due to dermatophyte 05/05/2012   Osteoporosis, post-menopausal - Dr Chalmers Cater    GERD (gastroesophageal reflux disease)    Benign hypertensive heart disease without heart failure 04/28/2011   Osteoarthritis 01/08/2011   Hypothyroidism - Dr Chalmers Cater 02/07/2009   Essential hypertension 02/07/2009   ALLERGIC RHINITIS 02/07/2009   Intrinsic asthma 02/07/2009    Past Surgical History:  Procedure Laterality Date   CARDIOVASCULAR STRESS TEST  07/12/2005   EF 85%   CERVICAL DISCECTOMY  [place]   (L) wrist x's 2   KNEE ARTHROPLASTY  04/22/2006   DR. Camelia Phenes CAFFREY   Left knee replacement  2005   LUMBAR [place] SURGERY     PAROTID GLAND TUMOR EXCISION  1980, 2007   Left   right knee replacement  2008   TONSILLECTOMY AND ADENOIDECTOMY     TUBAL LIGATION      OB History   No obstetric history on file.      Home Medications    Prior to Admission medications   Medication Sig Start Date End Date Taking? Authorizing Provider  amLODipine (NORVASC) 5 MG tablet TAKE 1 TABLET BY MOUTH EVERY DAY 10/21/20  Yes Burns, Claudina Lick, MD  atorvastatin (LIPITOR) 20 MG tablet Take 1 tablet (20 mg total) by mouth daily. 11/18/20  Yes Burns, Claudina Lick, MD  b complex vitamins tablet Take 1 tablet by mouth daily.   Yes [provider]  Biotin 10000 MCG TABS See admin instructions.   Yes [provider]  cholecalciferol (VITAMIN D) 1000 UNITS tablet Take 400 Units by mouth daily.   Yes [provider]  etodolac (LODINE XL) 400 MG 24 hr tablet TAKE 1 TABLET DAILY 12/09/20  Yes Burns, Claudina Lick, MD  famotidine (PEPCID) 20 MG  tablet Take 1 tablet (20 mg total) by mouth 2 (two) times daily. 05/20/20  Yes Burns, Claudina Lick, MD  hydrocortisone-pramoxine (ANALPRAM HC) 2.5-1 % rectal cream Place 1 application rectally 3 (three) times daily. 09/11/16  Yes Mabe, Shanon Brow, NP  latanoprost (XALATAN) 0.005 % ophthalmic solution  04/15/19  Yes [provider]  meclizine (ANTIVERT) 25 MG tablet Take 1 tablet (25 mg total) by mouth 3 (three) times daily as needed for dizziness. 09/29/16  Yes Burns, Claudina Lick, MD  nitrofurantoin, macrocrystal-monohydrate, (MACROBID) 100 MG capsule Take 1 capsule (100 mg total) by mouth 2 (two) times daily. 01/03/21  Yes Francene Finders, PA-C  OXcarbazepine (TRILEPTAL) 150 MG tablet TAKE 1 TABLET BY MOUTH TWICE A DAY 11/24/20  Yes Melvenia Beam, MD  Pramoxine-HC (HYDROCORTISONE ACE-PRAMOXINE) 2.5-1 % CREA Place rectally. 09/11/16   Yes [provider]  promethazine (PHENERGAN) 25 MG tablet Take 1 tablet (25 mg total) by mouth every 6 (six) hours as needed for nausea or vomiting. 01/03/21  Yes Francene Finders, PA-C  PROVENTIL HFA 108 (90 Base) MCG/ACT inhaler INHALE 1-2 PUFFS INTO THE LUNGS EVERY 6 (SIX) HOURS AS NEEDED FOR WHEEZING. 04/06/17  Yes Burns, Claudina Lick, MD  SYNTHROID 112 MCG tablet Take 1 tablet (112 mcg total) by mouth daily. 09/26/20  Yes Burns, Claudina Lick, MD  telmisartan (MICARDIS) 80 MG tablet Take 1 tablet (80 mg total) by mouth daily. 04/14/20  Yes Burns, Claudina Lick, MD  UNABLE TO FIND at bedtime. Osteo-ease   Yes [provider]  vitamin E 200 UNIT capsule Take 200 Units by mouth daily.   Yes [provider]  zoledronic acid (RECLAST) 5 MG/100ML SOLN injection See admin instructions. 08/01/18  Yes [provider]    Family History Family History  Problem Relation Age of Onset   Asthma Mother    Cancer Mother    Heart disease Mother    Hypertension Father    Heart disease Father    Hyperlipidemia Father    Stroke Father    Mental illness Father    Stroke Maternal Grandmother    Heart disease Maternal Grandfather    Hypertension Paternal Grandfather     Social History Social History   Tobacco Use   Smoking status: Former    Types: Cigarettes    Quit date: 09/01/1962    Years since quitting: 58.3   Smokeless tobacco: Never  Substance Use Topics   Alcohol use: No    Alcohol/week: 0.0 standard drinks   Drug use: No     Allergies   Contrast media [iodinated diagnostic agents], Dyphylline-guaifenesin, Erythromycin, Gabapentin, Mevacor [lovastatin], and Other   Review of Systems Review of Systems  Constitutional:  Negative for chills and fever.  Eyes:  Negative for discharge and redness.  Respiratory:  Negative for shortness of breath.   Gastrointestinal:  Negative for nausea and vomiting.  Genitourinary:  Negative for dysuria.  Musculoskeletal:  Positive for  back pain.    Physical Exam Triage Vital Signs ED Triage Vitals  Enc Vitals Group     BP 01/03/21 1045 (!) 179/95     Pulse Rate 01/03/21 1045 72     Resp --      Temp 01/03/21 1045 97.9 F (36.6 C)     Temp Source 01/03/21 1045 Oral     SpO2 01/03/21 1045 95 %     Weight 01/03/21 1046 167 lb (75.8 kg)     Height 01/03/21 1046 4\' 11"  (1.499 m)  Head Circumference --      Peak Flow --      Pain Score 01/03/21 1046 10     Pain Loc --      Pain Edu? --      Excl. in [place]? --    No data found.  Updated Vital Signs BP (!) 179/95 (BP Location: Left Arm)   Pulse 72   Temp 97.9 F (36.6 C) (Oral)   Ht 4\' 11"  (1.499 m)   Wt 167 lb (75.8 kg)   SpO2 95%   BMI 33.73 kg/m      Physical Exam Vitals and nursing note reviewed.  Constitutional:      General: She is not in acute distress.    Appearance: Normal appearance. She is not ill-appearing.  HENT:     Head: Normocephalic and atraumatic.  Eyes:     Conjunctiva/sclera: Conjunctivae normal.  Cardiovascular:     Rate and Rhythm: Normal rate and regular rhythm.     Heart sounds: Normal heart sounds. No murmur heard. Pulmonary:     Effort: Pulmonary effort is normal. No respiratory distress.     Breath sounds: Normal breath sounds. No wheezing, rhonchi or rales.  Musculoskeletal:     Comments: No TTP to low back diffusely  Neurological:     Mental Status: She is alert.     UC Treatments / Results  Labs (all labs ordered are listed, but only abnormal results are displayed) Labs Reviewed  POCT URINALYSIS DIP (MANUAL ENTRY) - Abnormal; Notable for the following components:      Result Value   Clarity, UA hazy (*)    Bilirubin, UA large (*)    Protein Ur, POC trace (*)    Leukocytes, UA Small (1+) (*)    All other components within normal limits    EKG   Radiology No results found.  Procedures Procedures (including critical care time)  Medications Ordered in UC Medications - No data to display  Initial  Impression / Assessment and Plan / UC Course  I have reviewed the triage vital signs and the nursing notes.  Pertinent labs & imaging results that were available during my care of the patient were reviewed by me and considered in my medical decision making (see chart for details).    UA positive for leukocyte E[place].  Will treat to cover UTI.  Insufficient sample to send for culture.  Patient requests Phenergan as she typically will have some nausea with antibiotic therapy.  Recommend follow-up if symptoms worsen or with any further concerns.  Final Clinical Impressions(s) / UC Diagnoses   Final diagnoses:  Acute midline low back pain without sciatica  Acute cystitis without hematuria   Discharge Instructions   None    ED Prescriptions     Medication Sig Dispense Auth. Provider   nitrofurantoin, macrocrystal-monohydrate, (MACROBID) 100 MG capsule Take 1 capsule (100 mg total) by mouth 2 (two) times daily. 10 capsule Francene Finders, PA-C   promethazine (PHENERGAN) 25 MG tablet Take 1 tablet (25 mg total) by mouth every 6 (six) hours as needed for nausea or vomiting. 30 tablet Francene Finders, PA-C      PDMP not reviewed this encounter.   Francene Finders, PA-C 01/03/21 1205

## 2021-01-06 ENCOUNTER — Telehealth (HOSPITAL_COMMUNITY): Payer: Self-pay | Admitting: Emergency Medicine

## 2021-01-06 MED ORDER — CEFDINIR 300 MG PO CAPS: 300.0000 mg | ORAL_CAPSULE | Freq: Two times a day (BID) | ORAL | 0 refills | Status: AC

## 2021-01-06 NOTE — Telephone Encounter (Signed)
Omnicef, per Dr. Lanny Cramp, as patient failed to resolve symptoms with previous treatment

## 2021-01-10 ENCOUNTER — Other Ambulatory Visit: Payer: Self-pay | Admitting: Internal Medicine

## 2021-01-30 ENCOUNTER — Other Ambulatory Visit: Payer: Self-pay

## 2021-01-30 ENCOUNTER — Ambulatory Visit (INDEPENDENT_AMBULATORY_CARE_PROVIDER_SITE_OTHER): Payer: Medicare Other | Admitting: Surgical

## 2021-01-30 ENCOUNTER — Ambulatory Visit (INDEPENDENT_AMBULATORY_CARE_PROVIDER_SITE_OTHER): Payer: Medicare Other

## 2021-01-30 ENCOUNTER — Encounter: Payer: Self-pay | Admitting: Orthopedic Surgery

## 2021-01-30 DIAGNOSIS — M545 Low back pain, unspecified: Secondary | ICD-10-CM

## 2021-01-30 MED ORDER — PREDNISONE 5 MG (21) PO TBPK: ORAL_TABLET | ORAL | 0 refills | Status: DC

## 2021-01-30 NOTE — Progress Notes (Signed)
Office Visit Note   Patient: Anna Adams           Date of Birth: 15-Jan-1935           MRN: [id number] Visit Date: 01/30/2021 Requested by: Binnie Rail, MD [place],  [place] [address] PCP: Binnie Rail, MD  Subjective: Chief Complaint  Patient presents with   Lower Back - Pain    HPI: Anna Adams is a 87 y.o. female who presents to the office complaining of low back pain.  Patient states that her back pain began on 12/07/2020 without injury.  Is been ongoing since then.  No history of recent injury.  This pain started out of nowhere.  She rates her pain 11/10 on the pain scale.  Most of her pain is in the midline low back with no radicular pain.  Denies any numbness or tingling or weakness in her legs subjectively.  Denies any incontinence or saddle anesthesia.  No history of prior back pain aside from neck pain that she had surgery for about 30 years ago.  She has had no lumbar spine surgery.  She has had treatment for UTI recently around Christmas time requiring 2 rounds of antibiotics and she does have a little bit of continued urinary frequency but no dysuria.  Denies any nausea/vomiting/low appetite/fevers, chills, night sweats, flank pain.  She has no history of ESI's in the past.  She has been trying to take Tylenol and use heat without relief.  Pain is waking her up at night..                ROS: All systems reviewed are negative as they relate to the chief complaint within the history of present illness.  Patient denies fevers or chills.  Assessment & Plan: Visit Diagnoses:  1. Low back pain, unspecified back pain laterality, unspecified chronicity, unspecified whether sciatica present     Plan: Patient is a 87 year old female who presents for further evaluation of low back pain.  She is never really had low back pain before but this began about 2 months ago without injury.  She has severe pain that has been life altering and is not associated with any  radicular pain.  On exam she has severe tenderness over the axial lumbar spine as well as a positive straight leg raise on the left side.  No sign of cauda equina syndrome.  Radiographs of the lumbar spine taken today demonstrate grade 2 spondylolisthesis at L4-L5 with severe loss of disc space at this level and erosion of the inferior aspect of the L4 vertebra.  There is also severe degenerative changes of the L5-S1 disc space without spondylolisthesis.  Plan for further evaluation of her spondylolisthesis and potential nerve compression/spinal stenosis with lumbar spine MRI.  Prescribe Medrol Dosepak for symptomatic relief in the meantime.  Plan to have her follow-up after the MRI and we can try ESI's following reviewing the MRI.  Patient and family agreed with plan.  Patient also had bilateral shoulder injections at last appointment and her shoulders are doing very well at this point.  Follow-Up Instructions: No follow-ups on file.   Orders:  Orders Placed This Encounter  Procedures   XR Lumbar Spine 2-3 Views   MR Lumbar Spine w/o contrast   Meds ordered this encounter  Medications   predniSONE (STERAPRED UNI-PAK 21 TAB) 5 MG (21) TBPK tablet    Sig: Take dosepak as directed    Dispense:  21 tablet    Refill:  0      Procedures: No procedures performed   Clinical Data: No additional findings.  Objective: Vital Signs: There were no vitals taken for this visit.  Physical Exam:  Constitutional: Patient appears well-developed HEENT:  Head: Normocephalic Eyes:EOM are normal Neck: Normal range of motion Cardiovascular: Normal rate Pulmonary/chest: Effort normal Neurologic: Patient is alert Skin: Skin is warm Psychiatric: Patient has normal mood and affect  Ortho Exam: Ortho exam demonstrates low back pain with severe tenderness over the axial lumbar spine.  No flank pain with palpation over the kidneys.  Positive straight leg raise on left.  Negative on right.  5/5 motor  strength of bilateral hip flexion, quadricep, hamstring, dorsiflexion, plantarflexion.  2+ reflexes of patellar tendon bilaterally.  Specialty Comments:  No specialty comments available.  Imaging: No results found.   PMFS History: Patient Active Problem List   Diagnosis Date Noted   Fall 05/20/2020   Disorder of right trigeminal nerve 12/31/2019   Poor balance 10/23/2019   Trigeminal neuralgia 04/20/2019   Right carpal tunnel syndrome 04/12/2018   Hyperparathyroidism ([facility]) - Dr Chalmers Cater 04/10/2018   Diverticulitis 10/12/2017   Prediabetes 10/11/2017   Squamous cell skin cancer 04/01/2016   Hiatal hernia, large 09/22/2015   Pain in lower limb 03/04/2015   Edema 11/26/2013   Parotid tumor 03/23/2013   Dyslipidemia    Onychomycosis due to dermatophyte 05/05/2012   Osteoporosis, post-menopausal - Dr Chalmers Cater    GERD (gastroesophageal reflux disease)    Benign hypertensive heart disease without heart failure 04/28/2011   Osteoarthritis 01/08/2011   Hypothyroidism - Dr Chalmers Cater 02/07/2009   Essential hypertension 02/07/2009   ALLERGIC RHINITIS 02/07/2009   Intrinsic asthma 02/07/2009   Past Medical History:  Diagnosis Date   ALLERGIC RHINITIS    Anemia    Asthma    Asthma    Cataract    GERD (gastroesophageal reflux disease)    with HH   History of renal calculi    Hypertension    Hypothyroidism    Osteoarthritis    Parotid tumor    L side, s/p resection 1980 and 2007    Family History  Problem Relation Age of Onset   Asthma Mother    Cancer Mother    Heart disease Mother    Hypertension Father    Heart disease Father    Hyperlipidemia Father    Stroke Father    Mental illness Father    Stroke Maternal Grandmother    Heart disease Maternal Grandfather    Hypertension Paternal Grandfather     Past Surgical History:  Procedure Laterality Date   CARDIOVASCULAR STRESS TEST  07/12/2005   EF 85%   CERVICAL DISCECTOMY  [place]   (L)  wrist x's 2   KNEE ARTHROPLASTY  04/22/2006   DR. Camelia Phenes [facility]FFRE[facility]   Left knee replacement  2005   LUMBAR [place] SURGERY     PAROTID GLAND TUMOR EXCISION  1980, 2007   Left   right knee replacement  2008   TONSILLECTOMY AND ADENOIDECTOMY     TUBAL LIGATION     Social History   Occupational History   Not on file  Tobacco Use   Smoking status: Former    Types: Cigarettes    Quit date: 09/01/1962    Years since quitting: 58.4   Smokeless tobacco: Never  Substance and Sexual Activity   Alcohol use: No  Alcohol/week: 0.0 standard drinks   Drug use: No   Sexual activity: Not on file

## 2021-02-08 ENCOUNTER — Ambulatory Visit
Admission: RE | Admit: 2021-02-08 | Discharge: 2021-02-08 | Disposition: A | Discharge status: Home / Self Care | Payer: Medicare Other | Source: Ambulatory Visit | Attending: Orthopedic Surgery | Admitting: Orthopedic Surgery

## 2021-02-08 ENCOUNTER — Other Ambulatory Visit: Payer: Self-pay

## 2021-02-08 DIAGNOSIS — M48061 Spinal stenosis, lumbar region without neurogenic claudication: Secondary | ICD-10-CM | POA: Diagnosis not present

## 2021-02-08 DIAGNOSIS — M545 Low back pain, unspecified: Secondary | ICD-10-CM

## 2021-02-08 DIAGNOSIS — M4316 Spondylolisthesis, lumbar region: Secondary | ICD-10-CM | POA: Diagnosis not present

## 2021-02-08 IMAGING — MR MR LUMBAR SPINE W/O CM
4 of 5 series · 27 of 48 positions shown · non-contrast
Comparison: None.

CLINICAL DATA: Severe back pain and spondylolisthesis. Low back
pain for 2 months

EXAM:
MRI LUMBAR SPINE WITHOUT CONTRAST
TECHNIQUE: Multiplanar, multisequence MR imaging of the lumbar spine was
performed. No intravenous contrast was administered.

[Series 3: T2 · sagittal · 4.0mm · 1.09mm/px · 6 of 15 slices shown (1 of 2)]
[im 1/15]
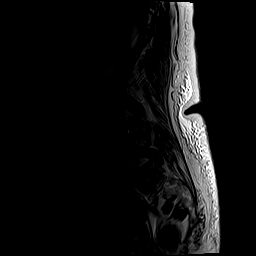
[im 3/15]
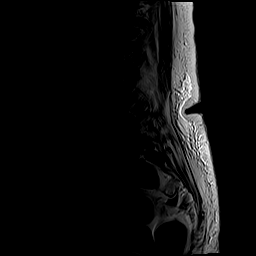
[im 6/15]
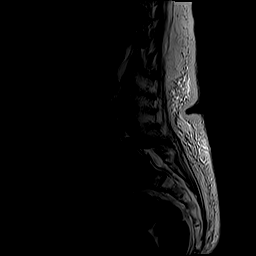
[im 9/15]
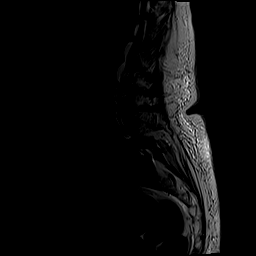
[im 12/15]
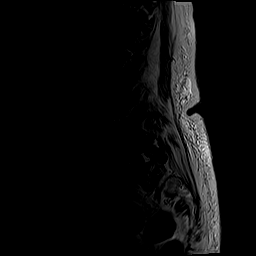
[im 15/15]
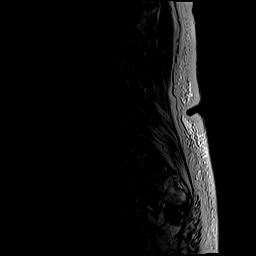

[Series 5: T1 · sagittal · 4.0mm · 1.09mm/px · 6 of 15 slices shown (1 of 2)]
[im 1/15]
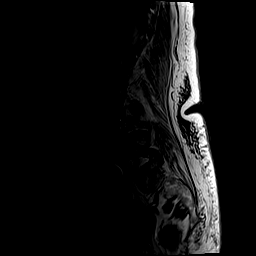
[im 3/15]
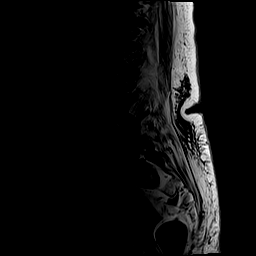
[im 6/15]
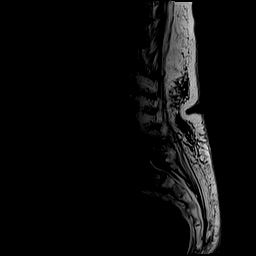
[im 9/15]
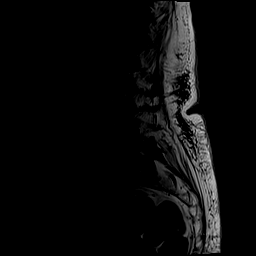
[im 12/15]
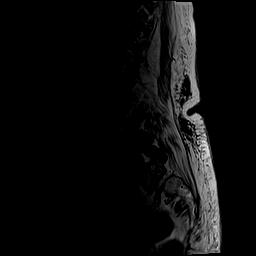
[im 15/15]
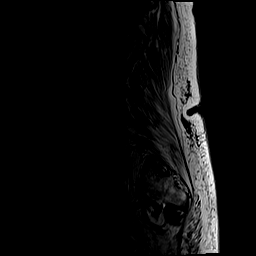

[Series 6: T2 · axial · 4.0mm · 0.39mm/px · z∈[-83,+117]mm · 9 of 36 slices shown (2 of 2)]
[im 1/36]
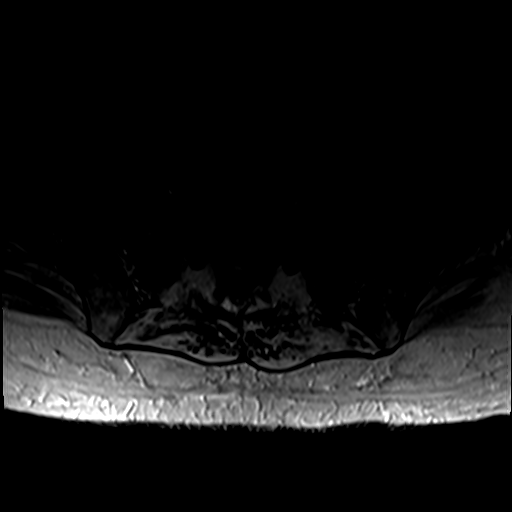
[im 6/36]
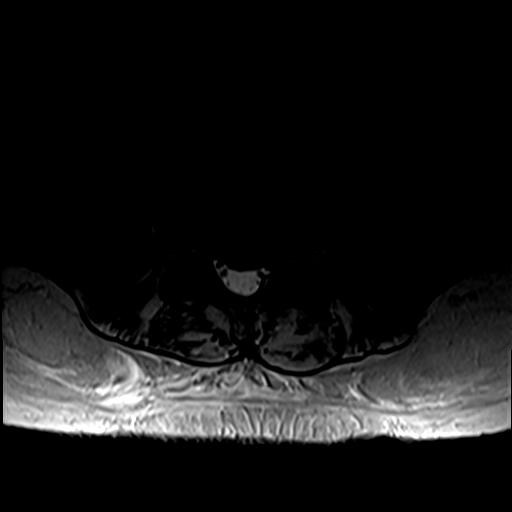
[im 11/36]
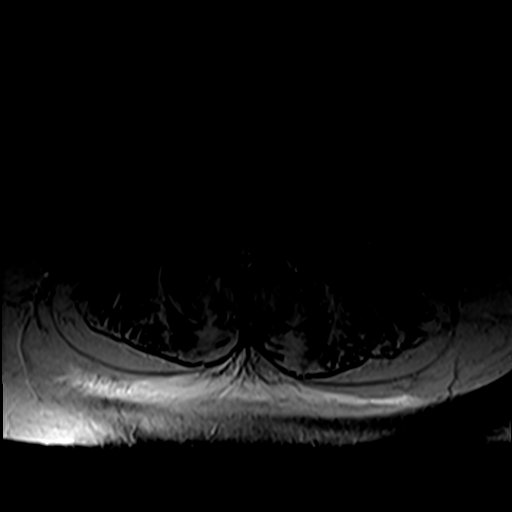
[im 16/36]
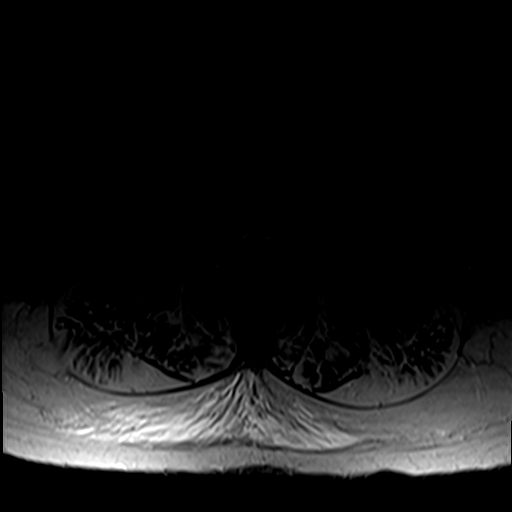
[im 18/36]
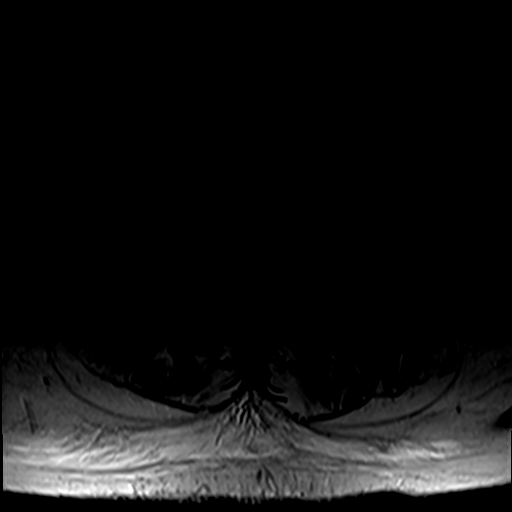
[im 21/36]
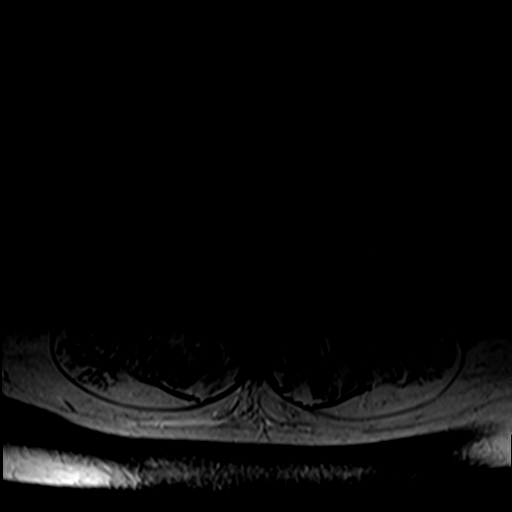
[im 26/36]
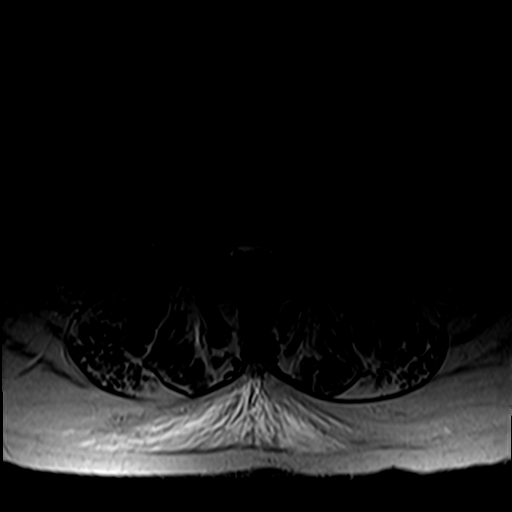
[im 31/36]
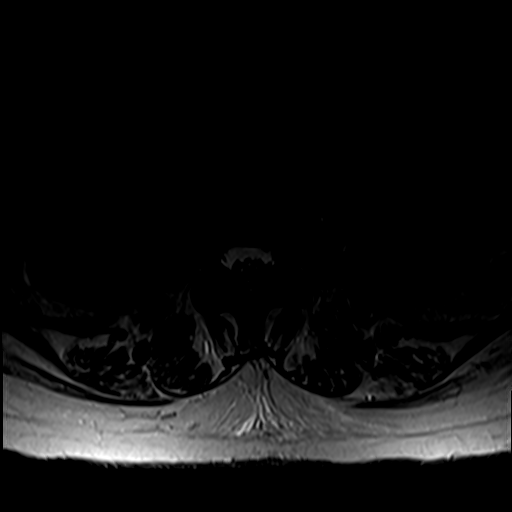
[im 36/36]
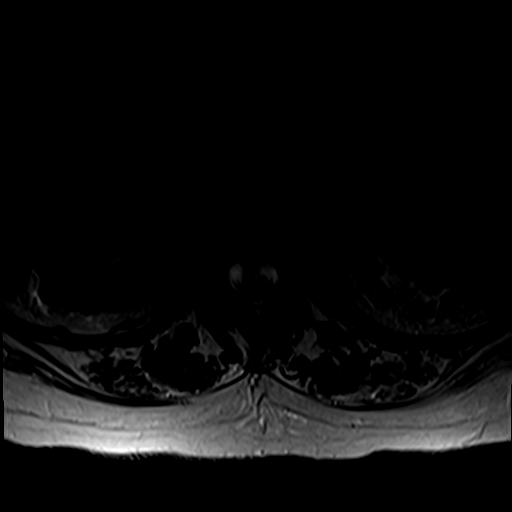

[Series 7: T1 · axial · 4.0mm · 0.39mm/px · z∈[-83,+93]mm · 6 of 36 slices shown (2 of 2)]
[im 1/36]
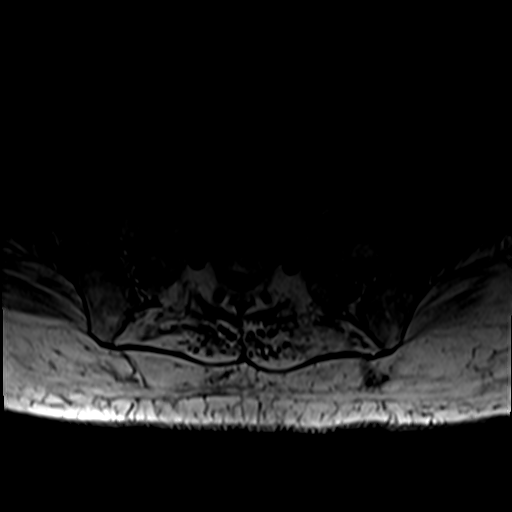
[im 6/36]
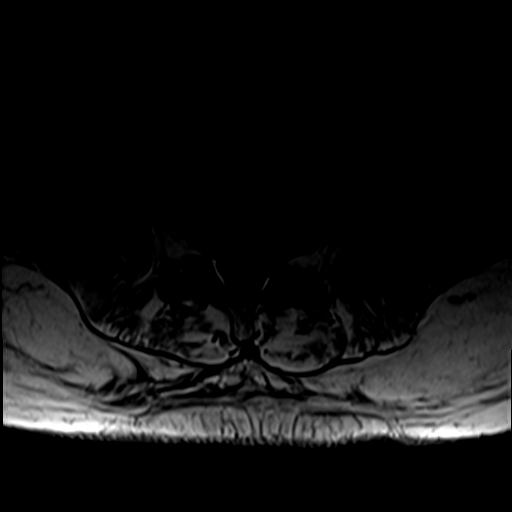
[im 11/36]
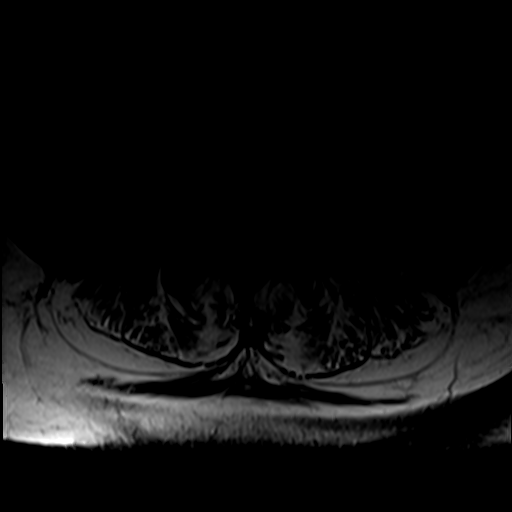
[im 16/36]
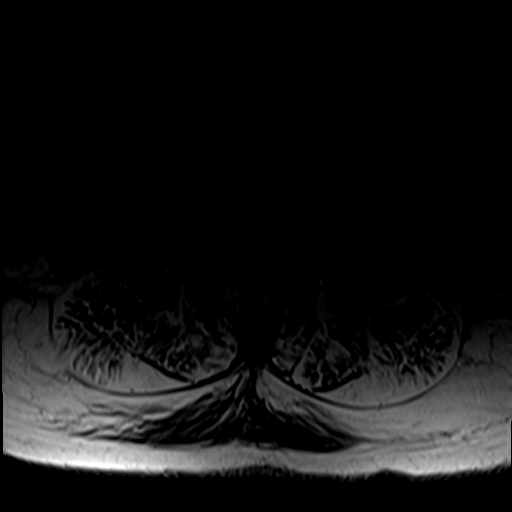
[im 18/36]
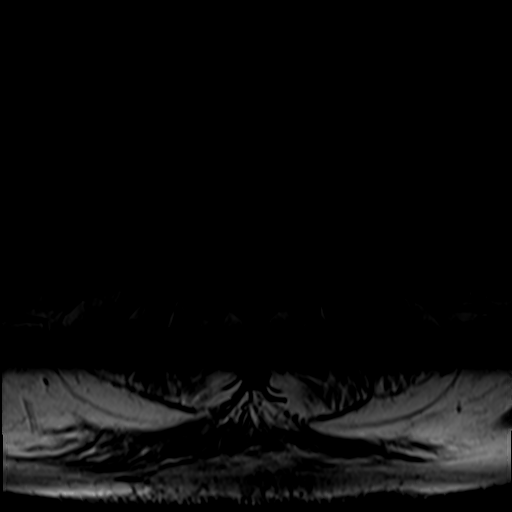
[im 31/36]
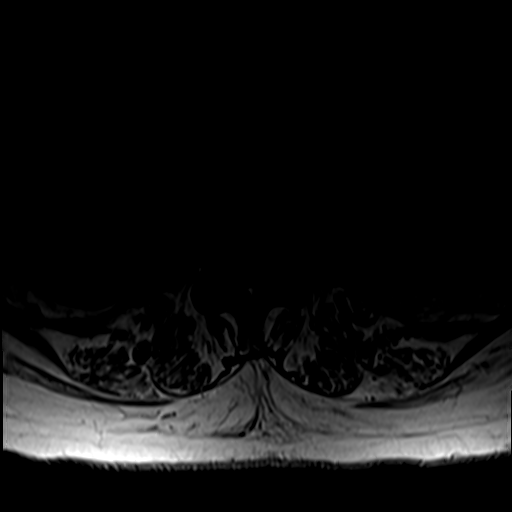

[27 of 48 positions shown; findings below may reference images not displayed]

FINDINGS: Segmentation:  5 lumbar type vertebrae

Alignment:  Grade [DATE] anterolisthesis at L4-5.

Vertebrae:  No fracture, evidence of discitis, or bone lesion.

Conus medullaris and cauda equina: Conus extends to the L1 level.
Conus and cauda equina appear normal.

Paraspinal and other soft tissues: Diffuse fatty atrophy of
intrinsic back muscles. Cholelithiasis and renal cysts. Colonic
diverticulosis.

Disc levels:

T10-11 and T11-12: Disc narrowing and bulging with endplate and
facet spurring. Bilateral foraminal impingement. Bulging disc
contacts the ventral cord

T12- L1: Disc bulging and facet spurring. Small left paracentral
protrusion.

L1-L2: Disc narrowing and bulging with degenerative facet spurring.
Right foraminal impingement and mild spinal stenosis

L2-L3: Disc narrowing and bulging with endplate degeneration. Facet
spurring and ligamentum flavum thickening. Advanced spinal stenosis.
Moderate bilateral foraminal narrowing.

L3-L4: Disc narrowing and bulging with degenerative facet spurring.
Moderate right foraminal narrowing. Mild spinal stenosis with
asymmetric right subarticular recess crowding

L4-L5: Severe facet osteoarthritis with spurring and
anterolisthesis. The disc is narrowed with endplate degeneration and
circumferential bulging. Central protrusion. Advanced spinal
stenosis. Moderate bilateral foraminal impingement

L5-S1:Disc narrowing and endplate degeneration with bulging and
central protrusion. Mild facet spurring.
IMPRESSION: 1. Generalized lumbar spine degeneration with L4-5 anterolisthesis.
2. Compressive spinal stenosis at L2-3 and L4-5.
3. Multilevel foraminal impingement as described above.
4. Cholelithiasis and other incidental findings noted above.

## 2021-02-11 ENCOUNTER — Other Ambulatory Visit: Payer: Self-pay

## 2021-02-11 ENCOUNTER — Ambulatory Visit (INDEPENDENT_AMBULATORY_CARE_PROVIDER_SITE_OTHER): Payer: Medicare Other | Admitting: Orthopedic Surgery

## 2021-02-11 DIAGNOSIS — M545 Low back pain, unspecified: Secondary | ICD-10-CM | POA: Diagnosis not present

## 2021-02-15 ENCOUNTER — Encounter: Payer: Self-pay | Admitting: Orthopedic Surgery

## 2021-02-15 NOTE — Progress Notes (Signed)
Office Visit Note   Patient: Anna Adams           Date of Birth: March 22, 1934           MRN: [id number] Visit Date: 02/11/2021 Requested by: Binnie Rail, MD [place],  [place] [address] PCP: Binnie Rail, MD  Subjective: Chief Complaint  Patient presents with   Other     Scan review    HPI: Lloyd is an 87 year old patient who lives alone who has low back and leg pain.  Since she was last seen she has had an MRI scan.  That scan shows compressive spinal stenosis at L2-3 and L4-5.  Scan is reviewed with the patient and her son.  This is fairly significant.  She is not taking any blood thinners.  She is relatively opposed to surgery but is having increasing diminishment of walking endurance and worsening pain.              ROS: All systems reviewed are negative as they relate to the chief complaint within the history of present illness.  Patient denies  fevers or chills.   Assessment & Plan: Visit Diagnoses:  1. Low back pain, unspecified back pain laterality, unspecified chronicity, unspecified whether sciatica present     Plan: Impression is low back pain with stenosis at 2 levels.  Although she is 75 her stenosis is significant.  I think she needs surgical evaluation.  Also she is requesting epidural steroid injection which may or may not help based on the degree of stenosis.  She will follow-up with [place] as needed.  See Dr. Ernestina Patches as his schedule allows.  Follow-Up Instructions: No follow-ups on file.   Orders:  Orders Placed This Encounter  Procedures   Ambulatory referral to Physical Medicine Rehab   Ambulatory referral to Neurosurgery   No orders of the defined types were placed in this encounter.     Procedures: No procedures performed   Clinical Data: No additional findings.  Objective: Vital Signs: There were no vitals taken for this visit.  Physical Exam:   Constitutional: Patient appears well-developed HEENT:  Head:  Normocephalic Eyes:EOM are normal Neck: Normal range of motion Cardiovascular: Normal rate Pulmonary/chest: Effort normal Neurologic: Patient is alert Skin: Skin is warm Psychiatric: Patient has normal mood and affect   Ortho Exam: Ortho exam demonstrates good ankle dorsiflexion plantarflexion quad hamstring strength 5+ out of 5.  Pedal pulses palpable.  No groin pain with internal ex rotation of the leg.  Hip flexion strength bilaterally is about 5- out of 5.  No saddle paresthesias.  Specialty Comments:  No specialty comments available.  Imaging: No results found.   PMFS History: Patient Active Problem List   Diagnosis Date Noted   Fall 05/20/2020   Disorder of right trigeminal nerve 12/31/2019   Poor balance 10/23/2019   Trigeminal neuralgia 04/20/2019   Right carpal tunnel syndrome 04/12/2018   Hyperparathyroidism ([facility]) - Dr Chalmers Cater 04/10/2018   Diverticulitis 10/12/2017   Prediabetes 10/11/2017   Squamous cell skin cancer 04/01/2016   Hiatal hernia, large 09/22/2015   Pain in lower limb 03/04/2015   Edema 11/26/2013   Parotid tumor 03/23/2013   Dyslipidemia    Onychomycosis due to dermatophyte 05/05/2012   Osteoporosis, post-menopausal - Dr Chalmers Cater    GERD (gastroesophageal reflux disease)    Benign hypertensive heart disease without heart failure 04/28/2011   Osteoarthritis 01/08/2011   Hypothyroidism - Dr Chalmers Cater 02/07/2009   Essential hypertension 02/07/2009  ALLERGIC RHINITIS 02/07/2009   Intrinsic asthma 02/07/2009   Past Medical History:  Diagnosis Date   ALLERGIC RHINITIS    Anemia    Asthma    Asthma    Cataract    GERD (gastroesophageal reflux disease)    with HH   History of renal calculi    Hypertension    Hypothyroidism    Osteoarthritis    Parotid tumor    L side, s/p resection 1980 and 2007    Family History  Problem Relation Age of Onset   Asthma Mother    Cancer Mother    Heart disease Mother    Hypertension Father    Heart disease  Father    Hyperlipidemia Father    Stroke Father    Mental illness Father    Stroke Maternal Grandmother    Heart disease Maternal Grandfather    Hypertension Paternal Grandfather     Past Surgical History:  Procedure Laterality Date   CARDIOVASCULAR STRESS TEST  07/12/2005   EF 85%   CERVICAL DISCECTOMY  [place]   (L) wrist x's 2   KNEE ARTHROPLASTY  04/22/2006   DR. Camelia Phenes CAFFREY   Left knee replacement  2005   LUMBAR [place] SURGERY     PAROTID GLAND TUMOR EXCISION  1980, 2007   Left   right knee replacement  2008   TONSILLECTOMY AND ADENOIDECTOMY     TUBAL LIGATION     Social History   Occupational History   Not on file  Tobacco Use   Smoking status: Former    Types: Cigarettes    Quit date: 09/01/1962    Years since quitting: 58.4   Smokeless tobacco: Never  Substance and Sexual Activity   Alcohol use: No    Alcohol/week: 0.0 standard drinks   Drug use: No   Sexual activity: Not on file

## 2021-02-20 ENCOUNTER — Other Ambulatory Visit: Payer: Self-pay | Admitting: Neurology

## 2021-02-25 ENCOUNTER — Telehealth: Payer: Self-pay | Admitting: Internal Medicine

## 2021-02-25 NOTE — Telephone Encounter (Signed)
Sent in today already

## 2021-02-25 NOTE — Telephone Encounter (Signed)
Spoke with pt. She states she had asked Dr Quay Burow (primary care) to start refilling the Oxcarbazepine as she is no longer being followed by Dr Jaynee Eagles. I told the pt I would send the request over to Dr Quay Burow and she was very Patent attorney.

## 2021-02-25 NOTE — Telephone Encounter (Signed)
1.Medication Requested: OXcarbazepine (TRILEPTAL) 150 MG tablet  2. Pharmacy (Name, Street, [place]): [id number] - [place], [place].  Phone:  [phone] Fax:  [phone]   3. On Med List: yes  4. Last Visit with PCP: 10.25.22  5. Next visit date with PCP: 04.25.23   Agent: Please be advised that RX refills may take up to 3 business days. We ask that you follow-up with your pharmacy.

## 2021-03-02 ENCOUNTER — Ambulatory Visit (INDEPENDENT_AMBULATORY_CARE_PROVIDER_SITE_OTHER): Payer: Medicare Other | Admitting: Physical Medicine and Rehabilitation

## 2021-03-02 ENCOUNTER — Encounter: Payer: Self-pay | Admitting: Physical Medicine and Rehabilitation

## 2021-03-02 ENCOUNTER — Ambulatory Visit: Payer: Self-pay

## 2021-03-02 ENCOUNTER — Other Ambulatory Visit: Payer: Self-pay

## 2021-03-02 VITALS — BP 148/79 | HR 86

## 2021-03-02 DIAGNOSIS — M47816 Spondylosis without myelopathy or radiculopathy, lumbar region: Secondary | ICD-10-CM | POA: Diagnosis not present

## 2021-03-02 MED ORDER — METHYLPREDNISOLONE ACETATE 80 MG/ML IJ SUSP
80.0000 mg | Freq: Once | INTRAMUSCULAR | Status: AC
  Administered 2021-03-02: 80 mg

## 2021-03-02 NOTE — Patient Instructions (Signed)

## 2021-03-02 NOTE — Progress Notes (Signed)
Pt state lower back pain. Pt state any movement makes the pain worse. Pt state she uses heat and pain meds to help ease her pain.  Numeric Pain Rating Scale and Functional Assessment Average Pain 8   In the last MONTH (on 0-10 scale) has pain interfered with the following?  1. General activity like being  able to carry out your everyday physical activities such as walking, climbing stairs, carrying groceries, or moving a chair?  Rating(10)   +Driver, -BT, -Dye Allergies. Dr. Ernestina Patches she actually does have a contrast allergy listed in the chart.

## 2021-03-03 NOTE — Procedures (Signed)
Lumbar Facet Joint Intra-Articular Injection(s) with Fluoroscopic Guidance  Patient: Anna Adams      Date of Birth: 02/29/1934 MRN: [id number] PCP: Binnie Rail, MD      Visit Date: 03/02/2021   Universal Protocol:    Date/Time: 03/02/2021  Consent Given By: the patient  Position: PRONE   Additional Comments: Vital signs were monitored before and after the procedure. Patient was prepped and draped in the usual sterile fashion. The correct patient, procedure, and site was verified.   Injection Procedure Details:  Procedure Site One Meds Administered:  Meds ordered this encounter  Medications   methylPREDNISolone acetate (DEPO-MEDROL) injection 80 mg     Laterality: Bilateral  Location/Site:  L4-L5  Needle size: 22 guage  Needle type: Spinal  Needle Placement: Articular  Findings:  -Comments: Excellent flow of contrast producing a partial arthrogram.  Procedure Details: The fluoroscope beam is vertically oriented in AP, and the inferior recess is visualized beneath the lower pole of the inferior apophyseal process, which represents the target point for needle insertion. When direct visualization is difficult the target point is located at the medial projection of the vertebral pedicle. The region overlying each aforementioned target is locally anesthetized with a 1 to 2 ml. volume of 1% Lidocaine without Epinephrine.   The spinal needle was inserted into each of the above mentioned facet joints using biplanar fluoroscopic guidance. A 0.25 to 0.5 ml. volume of Isovue-250 was injected and a partial facet joint arthrogram was obtained. A single spot film was obtained of the resulting arthrogram.    One to 1.25 ml of the steroid/anesthetic solution was then injected into each of the facet joints noted above.   Additional Comments:  The patient tolerated the procedure well Dressing: 2 x 2 sterile gauze and Band-Aid    Post-procedure details: Patient was observed  during the procedure. Post-procedure instructions were reviewed.  Patient left the clinic in stable condition.

## 2021-03-03 NOTE — Progress Notes (Signed)
Anna Adams - 87 y.o. female MRN [id number]  Date of birth: 1934/10/18  Office Visit Note: Visit Date: 03/02/2021 PCP: Binnie Rail, MD Referred by: Binnie Rail, MD  Subjective: Chief Complaint  Patient presents with   Lower Back - Pain   HPI:  Anna Adams is a 87 y.o. female who comes in today at the request of Dr. Jean Rosenthal for planned Bilateral  L4-5 Lumbar facet/medial branch block with fluoroscopic guidance.  The patient has failed conservative care including home exercise, medications, time and activity modification.  This injection will be diagnostic and hopefully therapeutic.  Please see requesting physician notes for further details and justification.  Exam has shown concordant pain with facet joint loading.  Her case is complicated by history of contrast allergy which was a significant issue during an IV dye.  We will use a small amount in the facet joints.  Depending on relief may consider epidural injection.  We will have her follow-up with [place] in a couple weeks.  ROS Otherwise per HPI.  Assessment & Plan: Visit Diagnoses:    ICD-10-CM   1. Spondylosis without myelopathy or radiculopathy, lumbar region  M47.816 XR C-ARM NO REPORT    Facet Injection    methylPREDNISolone acetate (DEPO-MEDROL) injection 80 mg      Plan: No additional findings.   Meds & Orders:  Meds ordered this encounter  Medications   methylPREDNISolone acetate (DEPO-MEDROL) injection 80 mg    Orders Placed This Encounter  Procedures   Facet Injection   XR C-ARM NO REPORT    Follow-up: Return in about 2 weeks (around 03/16/2021) for Consider epidural injection for stenosis.   Procedures: No procedures performed  Lumbar Facet Joint Intra-Articular Injection(s) with Fluoroscopic Guidance  Patient: Anna Adams      Date of Birth: 12-12-1934 MRN: [id number] PCP: Binnie Rail, MD      Visit Date: 03/02/2021   Universal Protocol:    Date/Time: 03/02/2021  Consent Given By:  the patient  Position: PRONE   Additional Comments: Vital signs were monitored before and after the procedure. Patient was prepped and draped in the usual sterile fashion. The correct patient, procedure, and site was verified.   Injection Procedure Details:  Procedure Site One Meds Administered:  Meds ordered this encounter  Medications   methylPREDNISolone acetate (DEPO-MEDROL) injection 80 mg     Laterality: Bilateral  Location/Site:  L4-L5  Needle size: 22 guage  Needle type: Spinal  Needle Placement: Articular  Findings:  -Comments: Excellent flow of contrast producing a partial arthrogram.  Procedure Details: The fluoroscope beam is vertically oriented in AP, and the inferior recess is visualized beneath the lower pole of the inferior apophyseal process, which represents the target point for needle insertion. When direct visualization is difficult the target point is located at the medial projection of the vertebral pedicle. The region overlying each aforementioned target is locally anesthetized with a 1 to 2 ml. volume of 1% Lidocaine without Epinephrine.   The spinal needle was inserted into each of the above mentioned facet joints using biplanar fluoroscopic guidance. A 0.25 to 0.5 ml. volume of Isovue-250 was injected and a partial facet joint arthrogram was obtained. A single spot film was obtained of the resulting arthrogram.    One to 1.25 ml of the steroid/anesthetic solution was then injected into each of the facet joints noted above.   Additional Comments:  The patient tolerated the procedure well Dressing: 2 x 2 sterile  gauze and Band-Aid    Post-procedure details: Patient was observed during the procedure. Post-procedure instructions were reviewed.  Patient left the clinic in stable condition.     Clinical History: MRI LUMBAR SPINE WITHOUT CONTRAST   TECHNIQUE: Multiplanar, multisequence MR imaging of the lumbar spine was performed. No  intravenous contrast was administered.   COMPARISON:  None.   FINDINGS: Segmentation:  5 lumbar type vertebrae   Alignment:  Grade 1/2 anterolisthesis at L4-5.   Vertebrae:  No fracture, evidence of discitis, or bone lesion.   Conus medullaris and cauda equina: Conus extends to the L1 level. Conus and cauda equina appear normal.   Paraspinal and other soft tissues: Diffuse fatty atrophy of intrinsic back muscles. Cholelithiasis and renal cysts. Colonic diverticulosis.   Disc levels:   T10-11 and T11-12: Disc narrowing and bulging with endplate and facet spurring. Bilateral foraminal impingement. Bulging disc contacts the ventral cord   T12- L1: Disc bulging and facet spurring. Small left paracentral protrusion.   L1-L2: Disc narrowing and bulging with degenerative facet spurring. Right foraminal impingement and mild spinal stenosis   L2-L3: Disc narrowing and bulging with endplate degeneration. Facet spurring and ligamentum flavum thickening. Advanced spinal stenosis. Moderate bilateral foraminal narrowing.   L3-L4: Disc narrowing and bulging with degenerative facet spurring. Moderate right foraminal narrowing. Mild spinal stenosis with asymmetric right subarticular recess crowding   L4-L5: Severe facet osteoarthritis with spurring and anterolisthesis. The disc is narrowed with endplate degeneration and circumferential bulging. Central protrusion. Advanced spinal stenosis. Moderate bilateral foraminal impingement   L5-S1:Disc narrowing and endplate degeneration with bulging and central protrusion. Mild facet spurring.   IMPRESSION: 1. Generalized lumbar spine degeneration with L4-5 anterolisthesis. 2. Compressive spinal stenosis at L2-3 and L4-5. 3. Multilevel foraminal impingement as described above. 4. Cholelithiasis and other incidental findings noted above.     Electronically Signed   By: Jorje Guild M.D.   On: 02/09/2021 10:55     Objective:  VS:   HT:     WT:    BMI:      BP:(!) 148/79   HR:86bpm   TEMP: ( )   RESP:  Physical Exam Vitals and nursing note reviewed.  Constitutional:      General: She is not in acute distress.    Appearance: Normal appearance. She is not ill-appearing.  HENT:     Head: Normocephalic and atraumatic.     Right Ear: External ear normal.     Left Ear: External ear normal.  Eyes:     Extraocular Movements: Extraocular movements intact.  Cardiovascular:     Rate and Rhythm: Normal rate.     Pulses: Normal pulses.  Pulmonary:     Effort: Pulmonary effort is normal. No respiratory distress.  Abdominal:     General: There is no distension.     Palpations: Abdomen is soft.  Musculoskeletal:        General: Tenderness present.     Cervical back: Neck supple.     Right lower leg: No edema.     Left lower leg: No edema.     Comments: Patient has good distal strength with no pain over the greater trochanters.  No clonus or focal weakness. Patient somewhat slow to rise from a seated position to full extension.  There is concordant low back pain with facet loading and lumbar spine extension rotation.  There are no definitive trigger points but the patient is somewhat tender across the lower back and PSIS.  There is  no pain with hip rotation.   Skin:    Findings: No erythema, lesion or rash.  Neurological:     General: No focal deficit present.     Mental Status: She is alert and oriented to person, place, and time.     Sensory: No sensory deficit.     Motor: No weakness or abnormal muscle tone.     Coordination: Coordination normal.  Psychiatric:        Mood and Affect: Mood normal.        Behavior: Behavior normal.     Imaging: XR C-ARM NO REPORT  Result Date: 03/02/2021 Please see Notes tab for imaging impression.

## 2021-03-04 DIAGNOSIS — M47816 Spondylosis without myelopathy or radiculopathy, lumbar region: Secondary | ICD-10-CM | POA: Diagnosis not present

## 2021-03-04 DIAGNOSIS — M4316 Spondylolisthesis, lumbar region: Secondary | ICD-10-CM | POA: Diagnosis not present

## 2021-03-06 ENCOUNTER — Telehealth: Payer: Self-pay | Admitting: Internal Medicine

## 2021-03-06 NOTE — Telephone Encounter (Signed)
Left message for patient to call back to schedule Medicare Annual Wellness Visit   Last AWV  09/22/16  Please schedule at anytime with [facility] if patient calls the office back.    40 Minutes appointment   Any questions, please call me at [phone]

## 2021-03-16 ENCOUNTER — Ambulatory Visit (INDEPENDENT_AMBULATORY_CARE_PROVIDER_SITE_OTHER): Payer: Medicare Other

## 2021-03-16 DIAGNOSIS — Z0000 Encounter for general adult medical examination without abnormal findings: Secondary | ICD-10-CM

## 2021-03-16 NOTE — Progress Notes (Signed)
I connected with Anna Adams today by telephone and verified that I am speaking with the correct person using two identifiers. Location patient: home Location provider: work Persons participating in the virtual visit: patient, provider.   I discussed the limitations, risks, security and privacy concerns of performing an evaluation and management service by telephone and the availability of in person appointments. I also discussed with the patient that there may be a patient responsible charge related to this service. The patient expressed understanding and verbally consented to this telephonic visit.    Interactive audio and video telecommunications were attempted between this provider and patient, however failed, due to patient having technical difficulties OR patient did not have access to video capability.  We continued and completed visit with audio only.  Some vital signs may be absent or patient reported.   Time Spent with patient on telephone encounter: 40 minutes  Subjective:   Anna Adams is a 87 y.o. female who presents for Medicare Annual (Subsequent) preventive examination.  Review of Systems     Cardiac Risk Factors include: advanced age (>63men, >77 women);dyslipidemia     Objective:    There were no vitals filed for this visit. There is no height or weight on file to calculate BMI.  Advanced Directives 03/16/2021 09/22/2016 07/17/2015  Does Patient Have a Medical Advance Directive? Yes No No  Type of Advance Directive Living will;Healthcare Power of Attorney - -  Does patient want to make changes to medical advance directive? No - Patient declined - -  Copy of [place] in Chart? No - copy requested - -  Would patient like information on creating a medical advance directive? - Yes (ED - Information included in AVS) No - patient declined information    Current Medications (verified) Outpatient Encounter Medications as of 03/16/2021  Medication Sig    amLODipine (NORVASC) 5 MG tablet TAKE 1 TABLET BY MOUTH EVERY DAY   atorvastatin (LIPITOR) 20 MG tablet Take 1 tablet (20 mg total) by mouth daily.   b complex vitamins tablet Take 1 tablet by mouth daily.   Biotin 10000 MCG TABS See admin instructions.   cholecalciferol (VITAMIN D) 1000 UNITS tablet Take 400 Units by mouth daily.   etodolac (LODINE XL) 400 MG 24 hr tablet TAKE 1 TABLET DAILY   famotidine (PEPCID) 20 MG tablet TAKE 1 TABLET BY MOUTH EVERY DAY   hydrocortisone-pramoxine (ANALPRAM HC) 2.5-1 % rectal cream Place 1 application rectally 3 (three) times daily.   latanoprost (XALATAN) 0.005 % ophthalmic solution    meclizine (ANTIVERT) 25 MG tablet Take 1 tablet (25 mg total) by mouth 3 (three) times daily as needed for dizziness.   nitrofurantoin, macrocrystal-monohydrate, (MACROBID) 100 MG capsule Take 1 capsule (100 mg total) by mouth 2 (two) times daily.   OXcarbazepine (TRILEPTAL) 150 MG tablet TAKE 1 TABLET BY MOUTH TWICE A DAY   Pramoxine-HC (HYDROCORTISONE ACE-PRAMOXINE) 2.5-1 % CREA Place rectally.   predniSONE (STERAPRED UNI-PAK 21 TAB) 5 MG (21) TBPK tablet Take dosepak as directed   promethazine (PHENERGAN) 25 MG tablet Take 1 tablet (25 mg total) by mouth every 6 (six) hours as needed for nausea or vomiting.   PROVENTIL HFA 108 (90 Base) MCG/ACT inhaler INHALE 1-2 PUFFS INTO THE LUNGS EVERY 6 (SIX) HOURS AS NEEDED FOR WHEEZING.   SYNTHROID 112 MCG tablet Take 1 tablet (112 mcg total) by mouth daily.   telmisartan (MICARDIS) 80 MG tablet TAKE 1 TABLET BY MOUTH EVERY DAY  UNABLE TO FIND at bedtime. Osteo-ease   vitamin E 200 UNIT capsule Take 200 Units by mouth daily.   zoledronic acid (RECLAST) 5 MG/100ML SOLN injection See admin instructions.   No facility-administered encounter medications on file as of 03/16/2021.    Allergies (verified) Contrast media [iodinated contrast media], Dyphylline-guaifenesin, Erythromycin, Gabapentin, Mevacor [lovastatin], and Other    History: Past Medical History:  Diagnosis Date   ALLERGIC RHINITIS    Anemia    Asthma    Asthma    Cataract    GERD (gastroesophageal reflux disease)    with HH   History of renal calculi    Hypertension    Hypothyroidism    Osteoarthritis    Parotid tumor    L side, s/p resection 1980 and 2007   Past Surgical History:  Procedure Laterality Date   CARDIOVASCULAR STRESS TEST  07/12/2005   EF 85%   CERVICAL DISCECTOMY  [place]   (L) wrist x's 2   KNEE ARTHROPLASTY  04/22/2006   DR. Camelia Phenes CAFFREY   Left knee replacement  2005   LUMBAR [place] SURGERY     PAROTID GLAND TUMOR EXCISION  1980, 2007   Left   right knee replacement  2008   TONSILLECTOMY AND ADENOIDECTOMY     TUBAL LIGATION     Family History  Problem Relation Age of Onset   Asthma Mother    Cancer Mother    Heart disease Mother    Hypertension Father    Heart disease Father    Hyperlipidemia Father    Stroke Father    Mental illness Father    Stroke Maternal Grandmother    Heart disease Maternal Grandfather    Hypertension Paternal Grandfather    Social History   Socioeconomic History   Marital status: Widowed    Spouse name: Not on file   Number of children: 2   Years of education: Not on file   Highest education level: Not on file  Occupational History   Not on file  Tobacco Use   Smoking status: Former    Types: Cigarettes    Quit date: 09/01/1962    Years since quitting: 58.5   Smokeless tobacco: Never  Substance and Sexual Activity   Alcohol use: No    Alcohol/week: 0.0 standard drinks   Drug use: No   Sexual activity: Not on file  Other Topics Concern   Not on file  Social History Narrative   Recently widowed, 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, retired Therapist, sports   Right handed   Caffeine: up to 4-5 cups/day (coffee and tea)   Social Determinants of Radio broadcast assistant Strain: Low Risk    Difficulty of Paying Living Expenses: Not hard at all  Food  Insecurity: No Food Insecurity   Worried About Charity fundraiser in the Last Year: Never true   Arboriculturist in the Last Year: Never true  Transportation Needs: No Transportation Needs   Lack of Transportation (Medical): No   Lack of Transportation (Non-Medical): No  Physical Activity: Sufficiently Active   Days of Exercise per Week: 5 days   Minutes of Exercise per Session: 30 min  Stress: No Stress Concern Present   Feeling of Stress : Not at all  Social Connections: Moderately Integrated   Frequency of Communication with Friends and Family: More than three times a week   Frequency of Social Gatherings with Friends and Family:  More than three times a week   Attends Religious Services: More than 4 times per year   Active Member of Clubs or Organizations: Yes   Attends Archivist Meetings: More than 4 times per year   Marital Status: Widowed    Tobacco Counseling Counseling given: Not Answered   Clinical Intake:  Pre-visit preparation completed: Yes  Pain : No/denies pain     Nutritional Risks: None Diabetes: No  How often do you need to have someone help you when you read instructions, pamphlets, or other written materials from your doctor or pharmacy?: 1 - Never 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 o  Interpreter Needed?: No  Information entered by :: Lisette Abu, LPN   Activities of Daily Living In your present state of health, do you have any difficulty performing the following activities: 03/16/2021 05/20/2020  Hearing? Y N  Vision? N N  Difficulty concentrating or making decisions? N N  Walking or climbing stairs? N N  Dressing or bathing? N N  Doing errands, shopping? N N  Preparing Food and eating ? N -  Using the Toilet? N -  In the past six months, have you accidently leaked urine? N -  Do you have problems with loss of bowel control? N -  Managing your Medications? N -   Managing your Finances? N -  Housekeeping or managing your Housekeeping? N -  Some recent data might be hidden    Patient Care Team: Binnie Rail, MD as PCP - General (Internal Medicine) Darlin Coco, MD (Cardiology) Shon Hough, MD (Ophthalmology) Camelia Phenes, DPM (Inactive) (Podiatry) Earlie Server, MD (Orthopedic Surgery) Clance, Armando Reichert, MD (Pulmonary Disease) Laurence Spates, MD (Inactive) (Gastroenterology) Jessy Oto, MD as Consulting Physician (Orthopedic Surgery) Szabat, Darnelle Maffucci, [facility] as Pharmacist (Pharmacist) Luberta Mutter, MD as Consulting Physician (Ophthalmology)  Indicate any recent Medical Services you may have received from other than [facility] providers in the past year (date may be approximate).     Assessment:   This is a routine wellness examination for [place].  Hearing/Vision screen Hearing Screening - Comments:: Patient has hearing difficulty and wears hearing aids. Vision Screening - Comments:: Patient wears corrective glasses/contacts.  Eye exam done annually by: Luberta Mutter, MD.  Dietary issues and exercise activities discussed: Current Exercise Habits: Home exercise routine, Type of exercise: walking, Time (Minutes): 30, Frequency (Times/Week): 5, Weekly Exercise (Minutes/Week): 150, Intensity: Mild   Goals Addressed             This Visit's Progress    Patient Stated       Continue to stay independent, physically and socially active.      Depression Screen PHQ 2/9 Scores 03/16/2021 10/16/2018 09/22/2016 09/22/2015 05/02/2015 04/29/2015 09/17/2014  PHQ - 2 Score 0 0 0 0 0 0 0  PHQ- 9 Score - - 0 - - - -    Fall Risk Fall Risk  03/16/2021 05/20/2020 04/20/2019 12/12/2017 09/22/2016  Falls in the past year? 1 1 1  0 No  Comment - - - Emmi Telephone Survey: data to providers prior to load -  Number falls in past yr: 0 0 1 - -  Injury with Fall? 0 1 0 - -  Risk for fall due to : Impaired balance/gait No Fall Risks - - Impaired  mobility  Follow up Falls evaluation completed Falls evaluation completed - - -    FALL RISK PREVENTION  PERTAINING TO THE HOME:  Any stairs in or around the home?  no If so, are there any without handrails? No  Home free of loose throw rugs in walkways, pet beds, electrical cords, etc? Yes  Adequate lighting in your home to reduce risk of falls? Yes   ASSISTIVE DEVICES UTILIZED TO PREVENT FALLS:  Life alert? Yes  Use of a cane, walker or w/c? Yes  Grab bars in the bathroom? Yes  Shower chair or bench in shower? Yes  Elevated toilet seat or a handicapped toilet? Yes   TIMED UP AND GO:  Was the test performed? No .  Length of time to ambulate 10 feet: n/a sec.   Gait slow and steady with assistive device  Cognitive Function: Normal cognitive status assessed by direct observation by this Nurse Health Advisor. No abnormalities found.   MMSE - Mini Mental State Exam 09/22/2016  Orientation to time 5  Orientation to Place 5  Registration 3  Attention/ Calculation 4  Recall 3  Language- name 2 objects 2  Language- repeat 1  Language- follow 3 step command 3  Language- read & follow direction 1  Write a sentence 1  Copy design 1  Total score 29        Immunizations Immunization History  Administered Date(s) Administered   Fluad Quad(high Dose 65+) 10/16/2018, 10/23/2019   Hepatitis B, adult 08/01/2018   Influenza Whole 11/08/2013   Influenza, High Dose Seasonal PF 10/12/2017, 10/27/2020   Influenza,inj,Quad PF,6+ Mos 10/25/2012   Influenza-Unspecified 10/26/2011, 11/04/2014, 11/04/2015, 10/25/2016   PFIZER Comirnaty(Gray Top)Covid-19 Tri-Sucrose Vaccine 06/03/2020   PFIZER(Purple Top)SARS-COV-2 Vaccination 03/22/2019, 04/17/2019   Pneumococcal Conjugate-13 11/26/2014   Pneumococcal Polysaccharide-23 01/25/2005, 08/01/2018   Td 01/25/2006   Tdap 09/29/2016   Zoster, Live 01/25/2009    TDAP status: Up to date  Flu Vaccine status: Up to date  Pneumococcal  vaccine status: Up to date  Covid-19 vaccine status: Completed vaccines  Qualifies for Shingles Vaccine? Yes   Zostavax completed Yes   Shingrix Completed?: No.    Education has been provided regarding the importance of this vaccine. Patient has been advised to call insurance company to determine out of pocket expense if they have not yet received this vaccine. Advised may also receive vaccine at local pharmacy or Health Dept. Verbalized acceptance and understanding.  Screening Tests Health Maintenance  Topic Date Due   Zoster Vaccines- Shingrix (1 of 2) Never done   COVID-19 Vaccine (4 - Booster for Pfizer series) 07/29/2020   DEXA SCAN  10/02/2022   TETANUS/TDAP  09/30/2026   Pneumonia Vaccine 4+ Years old  Completed   INFLUENZA VACCINE  Completed   HPV VACCINES  Aged Out    Health Maintenance  Health Maintenance Due  Topic Date Due   Zoster Vaccines- Shingrix (1 of 2) Never done   COVID-19 Vaccine (4 - Booster for Pfizer series) 07/29/2020    Colorectal cancer screening: No longer required.   Mammogram status: Completed 07/13/2018. Repeat every year  Bone Density status: Completed 10/01/2020. Results reflect: Bone density results: OSTEOPOROSIS. Repeat every 2 years.  Lung Cancer Screening: (Low Dose CT Chest recommended if Age 14-80 years, 30 pack-year currently smoking OR have quit w/in 15years.) does not qualify.   Lung Cancer Screening Referral: no  Additional Screening:  Hepatitis C Screening: does not qualify; Completed no  Vision Screening: Recommended annual ophthalmology exams for early detection of glaucoma and other disorders of the eye. Is the patient up to date with their  annual eye exam?  Yes  Who is the provider or what is the name of the office in which the patient attends annual eye exams? Luberta Mutter, MD. If pt is not established with a provider, would they like to be referred to a provider to establish care? No .   Dental Screening: Recommended  annual dental exams for proper oral hygiene  Community Resource Referral / Chronic Care Management: CRR required this visit?  No   CCM required this visit?  No      Plan:     I have personally reviewed and noted the following in the patients chart:   Medical and social history Use of alcohol, tobacco or illicit drugs  Current medications and supplements including opioid prescriptions.  Functional ability and status Nutritional status Physical activity Advanced directives List of other physicians Hospitalizations, surgeries, and ER visits in previous 12 months Vitals Screenings to include cognitive, depression, and falls Referrals and appointments  In addition, I have reviewed and discussed with patient certain preventive protocols, quality metrics, and best practice recommendations. A written personalized care plan for preventive services as well as general preventive health recommendations were provided to patient.     Sheral Flow, LPN   1/61/0960   Nurse Notes:  Patient is cogitatively intact. There were no vitals filed for this visit. There is no height or weight on file to calculate BMI.

## 2021-03-16 NOTE — Patient Instructions (Signed)
Anna Adams , Thank you for taking time to come for your Medicare Wellness Visit. I appreciate your ongoing commitment to your health goals. Please review the following plan we discussed and let me know if I can assist you in the future.   Screening recommendations/referrals: Colonoscopy: Not a candidate for screening due to age 87: Last done 07/13/2018 Bone Density: last done 10/01/2020; due every 2 years Recommended yearly ophthalmology/optometry visit for glaucoma screening and checkup Recommended yearly dental visit for hygiene and checkup  Vaccinations: Influenza vaccine: 10/27/2020 Pneumococcal vaccine: 11/26/2014, 08/01/2018 Tdap vaccine: 09/29/2016; due every 10 years Shingles vaccine: never done; prescription sent to [facility].   Covid-19: 03/22/2019, 04/17/2019, 06/03/2020  Advanced directives: Please bring a copy of your health care power of attorney and living will to the office at your convenience.  Conditions/risks identified: Yes; my goal is to continue to be independent, physically and socially active.  Next appointment: 03/17/2022 at 2:20 pm phone visit with Health Coach.   Preventive Care 55 Years and Older, Female Preventive care refers to lifestyle choices and visits with your health care provider that can promote health and wellness. What does preventive care include? A yearly physical exam. This is also called an annual well check. Dental exams once or twice a year. Routine eye exams. Ask your health care provider how often you should have your eyes checked. Personal lifestyle choices, including: Daily care of your teeth and gums. Regular physical activity. Eating a healthy diet. Avoiding tobacco and drug use. Limiting alcohol use. Practicing safe sex. Taking low-dose aspirin every day. Taking vitamin and mineral supplements as recommended by your health care provider. What happens during an annual well check? The services and screenings done by your health  care provider during your annual well check will depend on your age, overall health, lifestyle risk factors, and family history of disease. Counseling  Your health care provider may ask you questions about your: Alcohol use. Tobacco use. Drug use. Emotional well-being. Home and relationship well-being. Sexual activity. Eating habits. History of falls. Memory and ability to understand (cognition). Work and work Statistician. Reproductive health. Screening  You may have the following tests or measurements: Height, weight, and BMI. Blood pressure. Lipid and cholesterol levels. These may be checked every 5 years, or more frequently if you are over 81 years old. Skin check. Lung cancer screening. You may have this screening every year starting at age 64 if you have a 30-pack-year history of smoking and currently smoke or have quit within the past 15 years. Fecal occult blood test (FOBT) of the stool. You may have this test every year starting at age 36. Flexible sigmoidoscopy or colonoscopy. You may have a sigmoidoscopy every 5 years or a colonoscopy every 10 years starting at age 60. Hepatitis C blood test. Hepatitis B blood test. Sexually transmitted disease (STD) testing. Diabetes screening. This is done by checking your blood sugar (glucose) after you have not eaten for a while (fasting). You may have this done every 1-3 years. Bone density scan. This is done to screen for osteoporosis. You may have this done starting at age 17. Mammogram. This may be done every 1-2 years. Talk to your health care provider about how often you should have regular mammograms. Talk with your health care provider about your test results, treatment options, and if necessary, the need for more tests. Vaccines  Your health care provider may recommend certain vaccines, such as: Influenza vaccine. This is recommended every year. Tetanus, diphtheria,  and acellular pertussis (Tdap, Td) vaccine. You may need a Td  booster every 10 years. Zoster vaccine. You may need this after age 28. Pneumococcal 13-valent conjugate (PCV13) vaccine. One dose is recommended after age 25. Pneumococcal polysaccharide (PPSV23) vaccine. One dose is recommended after age 51. Talk to your health care provider about which screenings and vaccines you need and how often you need them. This information is not intended to replace advice given to you by your health care provider. Make sure you discuss any questions you have with your health care provider. Document Released: 02/07/2015 Document Revised: 10/01/2015 Document Reviewed: 11/12/2014 Elsevier Interactive Patient Education  2017 [place] Prevention in the Home Falls can cause injuries. They can happen to people of all ages. There are many things you can do to make your home safe and to help prevent falls. What can I do on the outside of my home? Regularly fix the edges of walkways and driveways and fix any cracks. Remove anything that might make you trip as you walk through a door, such as a raised step or threshold. Trim any bushes or trees on the path to your home. Use bright outdoor lighting. Clear any walking paths of anything that might make someone trip, such as rocks or tools. Regularly check to see if handrails are loose or broken. Make sure that both sides of any steps have handrails. Any raised decks and porches should have guardrails on the edges. Have any leaves, snow, or ice cleared regularly. Use sand or salt on walking paths during winter. Clean up any spills in your garage right away. This includes oil or grease spills. What can I do in the bathroom? Use night lights. Install grab bars by the toilet and in the tub and shower. Do not use towel bars as grab bars. Use non-skid mats or decals in the tub or shower. If you need to sit down in the shower, use a plastic, non-slip stool. Keep the floor dry. Clean up any water that spills on the floor  as soon as it happens. Remove soap buildup in the tub or shower regularly. Attach bath mats securely with double-sided non-slip rug tape. Do not have throw rugs and other things on the floor that can make you trip. What can I do in the bedroom? Use night lights. Make sure that you have a light by your bed that is easy to reach. Do not use any sheets or blankets that are too big for your bed. They should not hang down onto the floor. Have a firm chair that has side arms. You can use this for support while you get dressed. Do not have throw rugs and other things on the floor that can make you trip. What can I do in the kitchen? Clean up any spills right away. Avoid walking on wet floors. Keep items that you use a lot in easy-to-reach places. If you need to reach something above you, use a strong step stool that has a grab bar. Keep electrical cords out of the [employer]. Do not use floor polish or wax that makes floors slippery. If you must use wax, use non-skid floor wax. Do not have throw rugs and other things on the floor that can make you trip. What can I do with my stairs? Do not leave any items on the stairs. Make sure that there are handrails on both sides of the stairs and use them. Fix handrails that are broken or loose. Make sure  that handrails are as long as the stairways. Check any carpeting to make sure that it is firmly attached to the stairs. Fix any carpet that is loose or worn. Avoid having throw rugs at the top or bottom of the stairs. If you do have throw rugs, attach them to the floor with carpet tape. Make sure that you have a light switch at the top of the stairs and the bottom of the stairs. If you do not have them, ask someone to add them for you. What else can I do to help prevent falls? Wear shoes that: Do not have high heels. Have rubber bottoms. Are comfortable and fit you well. Are closed at the toe. Do not wear sandals. If you use a stepladder: Make sure that it is  fully opened. Do not climb a closed stepladder. Make sure that both sides of the stepladder are locked into place. Ask someone to hold it for you, if possible. Clearly mark and make sure that you can see: Any grab bars or handrails. First and last steps. Where the edge of each step is. Use tools that help you move around (mobility aids) if they are needed. These include: Canes. Walkers. Scooters. Crutches. Turn on the lights when you go into a dark area. Replace any light bulbs as soon as they burn out. Set up your furniture so you have a clear path. Avoid moving your furniture around. If any of your floors are uneven, fix them. If there are any pets around you, be aware of where they are. Review your medicines with your doctor. Some medicines can make you feel dizzy. This can increase your chance of falling. Ask your doctor what other things that you can do to help prevent falls. This information is not intended to replace advice given to you by your health care provider. Make sure you discuss any questions you have with your health care provider. Document Released: 11/07/2008 Document Revised: 06/19/2015 Document Reviewed: 02/15/2014 Elsevier Interactive Patient Education  2017 [employer].

## 2021-03-20 ENCOUNTER — Other Ambulatory Visit: Payer: Self-pay | Admitting: Internal Medicine

## 2021-03-24 ENCOUNTER — Encounter: Payer: Self-pay | Admitting: Physical Medicine and Rehabilitation

## 2021-03-24 ENCOUNTER — Ambulatory Visit (INDEPENDENT_AMBULATORY_CARE_PROVIDER_SITE_OTHER): Payer: Medicare Other | Admitting: Physical Medicine and Rehabilitation

## 2021-03-24 ENCOUNTER — Other Ambulatory Visit: Payer: Self-pay

## 2021-03-24 VITALS — BP 156/92 | HR 87

## 2021-03-24 DIAGNOSIS — M47816 Spondylosis without myelopathy or radiculopathy, lumbar region: Secondary | ICD-10-CM | POA: Diagnosis not present

## 2021-03-24 DIAGNOSIS — M545 Low back pain, unspecified: Secondary | ICD-10-CM

## 2021-03-24 DIAGNOSIS — G8929 Other chronic pain: Secondary | ICD-10-CM

## 2021-03-24 DIAGNOSIS — M48061 Spinal stenosis, lumbar region without neurogenic claudication: Secondary | ICD-10-CM | POA: Diagnosis not present

## 2021-03-24 NOTE — Progress Notes (Signed)
Pt state lower back pain.Pt has hx of inj on 03/02/21 pt state it helped. Pt state she takes over the counter pain meds to help ease her pain.  Numeric Pain Rating Scale and Functional Assessment Average Pain 8 Pain Right Now 3 My pain is intermittent and dull Pain is worse with: walking, standing, and some activites Pain improves with: medication and injections   In the last MONTH (on 0-10 scale) has pain interfered with the following?  1. General activity like being  able to carry out your everyday physical activities such as walking, climbing stairs, carrying groceries, or moving a chair?  Rating(6)  2. Relation with others like being able to carry out your usual social activities and roles such as  activities at home, at work and in your community. Rating(7)  3. Enjoyment of life such that you have  been bothered by emotional problems such as feeling anxious, depressed or irritable?  Rating(8)

## 2021-03-24 NOTE — Progress Notes (Signed)
Anna Adams - 87 y.o. female MRN [id number]  Date of birth: 1934/06/28  Office Visit Note: Visit Date: 03/24/2021 PCP: Binnie Rail, MD Referred by: Binnie Rail, MD  Subjective: Chief Complaint  Patient presents with   Lower Back - Pain   HPI: Anna Adams is a 87 y.o. female who comes in today for evaluation of chronic lower back pain. Patients son in law accompanied her during our visit today. Patient had bilateral L4-L5 intra-articular facet joint injections performed by Dr. Magnus Sinning on 03/02/2021 and reports significant and sustained relief of pain. Patient states bilateral lower back pain is minimal and is exacerbated by movement and activity, describes as soreness sensation, currently rates as 2 out of 10. Patient states she continues with home exercise program and use of medications as needed to help alleviate pain. Patient does not endorse pain to legs at this time. Patients recent lumbar MRI exhibits multi-level facet arthrosis, most severe at L4-L5 and advanced spinal canal stenosis at L2-L3 and L4-L5. Patient recently had consult with Dr. Consuella Lose at [facility] whom does not feel patient is a surgical candidate at this time. Patient states she is now able to function better and is completing daily tasks without difficulty. Patient feels her pain is now manageable at home. Patient is currently using cane to assist with ambulation and prevent falls. Patient denies focal weakness, numbness and tingling. Patient denies recent trauma or falls.   Review of Systems  Musculoskeletal:  Positive for back pain.  Neurological:  Negative for tingling, sensory change, focal weakness and weakness.  All other systems reviewed and are negative. Otherwise per HPI.  Assessment & Plan: Visit Diagnoses:    ICD-10-CM   1. Spondylosis without myelopathy or radiculopathy, lumbar region  M47.816     2. Chronic bilateral low back pain without sciatica  M54.50     G89.29     3. Facet arthropathy, lumbar  M47.816     4. Spinal stenosis of lumbar region without neurogenic claudication  M48.061        Plan: Findings:  Chronic bilateral axial back pain. No radicular symptoms noted. Significant and sustained relief of pain with recent bilateral L4-L5 intra-articular facet joint injections. Patients clinical presentation and exam do remain consistent with facet mediated pain. Patient continues conservative therapies such as home exercise regimen and use of medications to manage pain. I did briefly speak with patient about radiofrequency ablation to treat axial back pain. I explained to her that this procedure could provide her with longer sustained pain relief. We will revisit radiofrequency ablation conversation in the future. We feel the next step is to monitor patient, she is instructed to let us know if her pain worsens or changes in nature. We would consider performing epidural steroid injection if she exhibits radicular symptoms as she does have severe spinal canal stenosis at L2-L3 and L4-L5. Patient encouraged to remain active and to continue home exercise regimen as tolerated. Patient instructed to continue using cane to assist with ambulation and prevent falls. No reg flag symptoms noted upon exam today.    Meds & Orders: No orders of the defined types were placed in this encounter.  No orders of the defined types were placed in this encounter.   Follow-up: Return if symptoms worsen or fail to improve.   Procedures: No procedures performed      Clinical History: MRI LUMBAR SPINE WITHOUT CONTRAST   TECHNIQUE: Multiplanar, multisequence MR imaging of  the lumbar spine was performed. No intravenous contrast was administered.   COMPARISON:  None.   FINDINGS: Segmentation:  5 lumbar type vertebrae   Alignment:  Grade 1/2 anterolisthesis at L4-5.   Vertebrae:  No fracture, evidence of discitis, or bone lesion.   Conus medullaris and cauda  equina: Conus extends to the L1 level. Conus and cauda equina appear normal.   Paraspinal and other soft tissues: Diffuse fatty atrophy of intrinsic back muscles. Cholelithiasis and renal cysts. Colonic diverticulosis.   Disc levels:   T10-11 and T11-12: Disc narrowing and bulging with endplate and facet spurring. Bilateral foraminal impingement. Bulging disc contacts the ventral cord   T12- L1: Disc bulging and facet spurring. Small left paracentral protrusion.   L1-L2: Disc narrowing and bulging with degenerative facet spurring. Right foraminal impingement and mild spinal stenosis   L2-L3: Disc narrowing and bulging with endplate degeneration. Facet spurring and ligamentum flavum thickening. Advanced spinal stenosis. Moderate bilateral foraminal narrowing.   L3-L4: Disc narrowing and bulging with degenerative facet spurring. Moderate right foraminal narrowing. Mild spinal stenosis with asymmetric right subarticular recess crowding   L4-L5: Severe facet osteoarthritis with spurring and anterolisthesis. The disc is narrowed with endplate degeneration and circumferential bulging. Central protrusion. Advanced spinal stenosis. Moderate bilateral foraminal impingement   L5-S1:Disc narrowing and endplate degeneration with bulging and central protrusion. Mild facet spurring.   IMPRESSION: 1. Generalized lumbar spine degeneration with L4-5 anterolisthesis. 2. Compressive spinal stenosis at L2-3 and L4-5. 3. Multilevel foraminal impingement as described above. 4. Cholelithiasis and other incidental findings noted above.     Electronically Signed   By: Jorje Guild M.D.   On: 02/09/2021 10:55   She reports that she quit smoking about 58 years ago. She has never used smokeless tobacco.  Recent Labs    05/20/20 1339 11/18/20 1353  HGBA1C 5.8 5.6    Objective:  VS:  HT:     WT:    BMI:      BP: (!) 156/92   HR:87bpm   TEMP: ( )   RESP:  Physical Exam Vitals and nursing  note reviewed.  HENT:     Head: Normocephalic and atraumatic.     Right Ear: External ear normal.     Left Ear: External ear normal.     Nose: Nose normal.     Mouth/Throat:     Mouth: Mucous membranes are moist.  Eyes:     Extraocular Movements: Extraocular movements intact.  Cardiovascular:     Rate and Rhythm: Normal rate.     Pulses: Normal pulses.  Pulmonary:     Effort: Pulmonary effort is normal.  Abdominal:     General: Abdomen is flat. There is no distension.  Musculoskeletal:        General: Tenderness present.     Cervical back: Normal range of motion.     Right lower leg: Edema present.     Left lower leg: Edema present.     Comments: Pt is slow to rise from seated position to standing. Concordant low back pain with facet loading, lumbar spine extension and rotation. Strong distal strength without clonus, no pain upon palpation of greater trochanters. Sensation intact bilaterally. Ambulates with cane, gait unsteady.    Skin:    General: Skin is warm and dry.     Capillary Refill: Capillary refill takes less than 2 seconds.  Neurological:     Mental Status: She is alert and oriented to person, place, and time.  Gait: Gait abnormal.  Psychiatric:        Mood and Affect: Mood normal.        Behavior: Behavior normal.    Ortho Exam  Imaging: No results found.  Past Medical/Family/Surgical/Social History: Medications & Allergies reviewed per EMR, new medications updated. Patient Active Problem List   Diagnosis Date Noted   Fall 05/20/2020   Disorder of right trigeminal nerve 12/31/2019   Poor balance 10/23/2019   Trigeminal neuralgia 04/20/2019   Right carpal tunnel syndrome 04/12/2018   Hyperparathyroidism ([place]) - Dr Chalmers Cater 04/10/2018   Diverticulitis 10/12/2017   Prediabetes 10/11/2017   Squamous cell skin cancer 04/01/2016   Hiatal hernia, large 09/22/2015   Pain in lower limb 03/04/2015   Edema 11/26/2013   Parotid tumor 03/23/2013   Dyslipidemia     Onychomycosis due to dermatophyte 05/05/2012   Osteoporosis, post-menopausal - Dr Chalmers Cater    GERD (gastroesophageal reflux disease)    Benign hypertensive heart disease without heart failure 04/28/2011   Osteoarthritis 01/08/2011   Hypothyroidism - Dr Chalmers Cater 02/07/2009   Essential hypertension 02/07/2009   ALLERGIC RHINITIS 02/07/2009   Intrinsic asthma 02/07/2009   Past Medical History:  Diagnosis Date   ALLERGIC RHINITIS    Anemia    Asthma    Asthma    Cataract    GERD (gastroesophageal reflux disease)    with HH   History of renal calculi    Hypertension    Hypothyroidism    Osteoarthritis    Parotid tumor    L side, s/p resection 1980 and 2007   Family History  Problem Relation Age of Onset   Asthma Mother    Cancer Mother    Heart disease Mother    Hypertension Father    Heart disease Father    Hyperlipidemia Father    Stroke Father    Mental illness Father    Stroke Maternal Grandmother    Heart disease Maternal Grandfather    Hypertension Paternal Grandfather    Past Surgical History:  Procedure Laterality Date   CARDIOVASCULAR STRESS TEST  07/12/2005   EF 85%   CERVICAL DISCECTOMY  [place]   (L) wrist x's 2   KNEE ARTHROPLASTY  04/22/2006   DR. Camelia Phenes CAFFREY   Left knee replacement  2005   LUMBAR [place] SUR[facility]ERY     PAROTID GLAND TUMOR EXCISION  1980, 2007   Left   right knee replacement  2008   TONSILLECTOMY AND ADENOIDECTOMY     TUBAL LIGATION     Social History   Occupational History   Not on file  Tobacco Use   Smoking status: Former    Types: Cigarettes    Quit date: 09/01/1962    Years since quitting: 58.6   Smokeless tobacco: Never  Substance and Sexual Activity   Alcohol use: No    Alcohol/week: 0.0 standard drinks   Drug use: No   Sexual activity: Not on file

## 2021-03-25 ENCOUNTER — Ambulatory Visit (INDEPENDENT_AMBULATORY_CARE_PROVIDER_SITE_OTHER): Payer: Medicare Other | Admitting: Podiatry

## 2021-03-25 DIAGNOSIS — B351 Tinea unguium: Secondary | ICD-10-CM | POA: Diagnosis not present

## 2021-03-25 DIAGNOSIS — M2012 Hallux valgus (acquired), left foot: Secondary | ICD-10-CM

## 2021-03-25 DIAGNOSIS — M79676 Pain in unspecified toe(s): Secondary | ICD-10-CM

## 2021-03-25 DIAGNOSIS — L989 Disorder of the skin and subcutaneous tissue, unspecified: Secondary | ICD-10-CM

## 2021-03-25 DIAGNOSIS — M2011 Hallux valgus (acquired), right foot: Secondary | ICD-10-CM

## 2021-03-25 NOTE — Progress Notes (Signed)
? ?  SUBJECTIVE ?Patient presents to office today complaining of elongated, thickened nails that cause pain while ambulating in shoes. She is unable to trim her own nails. Patient is here for further evaluation and treatment. ? ?Past Medical History:  ?Diagnosis Date  ? ALLERGIC RHINITIS   ? Anemia   ? Asthma   ? Asthma   ? Cataract   ? GERD (gastroesophageal reflux disease)   ? with HH  ? History of renal calculi   ? Hypertension   ? Hypothyroidism   ? Osteoarthritis   ? Parotid tumor   ? L side, s/p resection 1980 and 2007  ? ? ?OBJECTIVE ?General Patient is awake, alert, and oriented x 3 and in no acute distress. ?Derm Skin is dry and supple bilateral. Negative open lesions or macerations. Remaining integument unremarkable. Nails are tender, long, thickened and dystrophic with subungual debris, consistent with onychomycosis, 1-5 bilateral. No signs of infection noted.  There is some diffuse hyperkeratotic skin tissue noted to the distal tips of the toes bilateral ?Vasc  DP and PT pedal pulses palpable bilaterally. Temperature gradient within normal limits.  ?Neuro Epicritic and protective threshold sensation grossly intact bilaterally.  ?Musculoskeletal Exam No symptomatic pedal deformities noted bilateral. Muscular strength within normal limits.  Clinical evidence of a hallux valgus deformity bilateral with lateral deviation of the great toes ? ?ASSESSMENT ?1. Onychodystrophic nails 1-5 bilateral with hyperkeratosis of nails.  ?2. Onychomycosis of nail due to dermatophyte bilateral ?3.  Preulcerative callus lesions bilateral feet  ?4.  Pain in foot bilateral ?5.  Hallux valgus bilateral ? ?PLAN OF CARE ?1. Patient evaluated today.  ?2. Instructed to maintain good pedal hygiene and foot care.  ?3. Mechanical debridement of nails 1-5 bilaterally performed using a nail nipper. Filed with dremel without incident.  ?4.  Excisional debridement of the hyperkeratotic preulcerative callus lesions was performed using a  tissue nipper without incident or bleeding  ?5.  Continue conservative treatment and management of the the hallux valgus and bunion deformities to the bilateral feet.  Currently the patient wears wide fitting shoes that do not aggravate her bunions.  Continue wearing good supportive shoes that are wide fitting.  Recommend conservative treatment versus surgical. ?6.  Return to clinic in 3 months ? ? ?Edrick Kins, DPM ?[place] ? ?Dr. Edrick Kins, DPM  ?  ?[address]                                     ?[place], [place] [address]                ?Office [phone]  ?Fax [phone] ? ? ? ? ?

## 2021-03-25 NOTE — Progress Notes (Signed)
°  I participated today with the patient's evaluation and treatment plan along with Barnet Pall, FNP

## 2021-04-21 ENCOUNTER — Other Ambulatory Visit: Payer: Self-pay | Admitting: Internal Medicine

## 2021-05-11 ENCOUNTER — Telehealth: Payer: Self-pay | Admitting: Orthopedic Surgery

## 2021-05-11 ENCOUNTER — Other Ambulatory Visit: Payer: Self-pay | Admitting: Orthopedic Surgery

## 2021-05-11 NOTE — Telephone Encounter (Signed)
Patient wants new prescription for prednisone dose pack for pain in spine ?

## 2021-05-11 NOTE — Telephone Encounter (Signed)
Okay for steroid Dosepak.  She also needs to see Dr. Ernestina Patches for [facility].  Would also recommend seeing Dr. Lorin Mercy or Dr. Richmond Campbell for surgical evaluation since she is requiring continued medical management of pretty severe stenosis

## 2021-05-12 ENCOUNTER — Telehealth: Payer: Self-pay | Admitting: Surgical

## 2021-05-12 ENCOUNTER — Telehealth: Payer: Self-pay | Admitting: Internal Medicine

## 2021-05-12 NOTE — Telephone Encounter (Signed)
Pt requesting provider to prescribe predniSONE (STERAPRED UNI-PAK 21 TAB) 5 MG (21) TBPK tablet for spine pain ? ?Advised pt provider is not prescribing provider, inquired if pt has call [facility] for refill ? ?Pt states they can not get her in, informed pt per note on her chart she has requested a refill and Dr. Marlou Sa at [facility] has o'kd it ? ?Pt still requested that I still send the request to provider ? ?Please advise ?

## 2021-05-12 NOTE — Telephone Encounter (Signed)
Patient called needing a medrol dose pack sent to [facility] pharmacy[facility] [address] The number to contact patient is [phone] ?

## 2021-05-12 NOTE — Telephone Encounter (Signed)
This is being addressed in a different encounter.

## 2021-05-12 NOTE — Telephone Encounter (Signed)
Spoke with patient today. ? ?She has been informed that she will need to get meds from Ortho. ? ?Patient voiced understanding. ?

## 2021-05-12 NOTE — Telephone Encounter (Signed)
Please advise if prednisone medication is needing to be refilled.  ?

## 2021-05-13 ENCOUNTER — Telehealth: Payer: Self-pay | Admitting: Surgical

## 2021-05-13 MED ORDER — PREDNISONE 5 MG (21) PO TBPK: ORAL_TABLET | ORAL | 0 refills | Status: DC

## 2021-05-13 NOTE — Telephone Encounter (Signed)
Pt called requesting a call from [facility]. Pt states she need her refill of prednisone. Please send to pharmacy. Pt phone number is [phone]. ?

## 2021-05-13 NOTE — Telephone Encounter (Signed)
Sounds like this was addressed in another message.  Thank you.

## 2021-05-13 NOTE — Telephone Encounter (Signed)
Thanks for sending into pharmacy.  I am glad she has had a surgical evaluation

## 2021-05-13 NOTE — Telephone Encounter (Signed)
See below --pt continues to call --but no one has seen this is the box? ?

## 2021-05-13 NOTE — Telephone Encounter (Signed)
I think it was called in already thanks

## 2021-05-13 NOTE — Telephone Encounter (Signed)
Medication sent to pharmacy. IC s/w patient and she stated she had already seen someone for eval of potential surgical intervention but she cannot recall who. She also stated she is not interested in having another ESI at this time since it has not been that long since she had one.  ?

## 2021-05-18 ENCOUNTER — Encounter: Payer: Self-pay | Admitting: Internal Medicine

## 2021-05-18 NOTE — Patient Instructions (Addendum)
? ? ? ?  Blood work was ordered.   ? ? ?Medications changes include :   tramadol 50 mg every 8 hours as needed for pain ? ? ?Your prescription(s) have been sent to your pharmacy.  ? ? ?An Echocardiogram was ordered - someone will call you to schedule that.  ? ? ?Return in about 6 months (around 11/18/2021) for follow up. ? ?

## 2021-05-18 NOTE — Progress Notes (Signed)
? ? ? ? ?Subjective:  ? ? Patient ID: Anna Adams, female    DOB: February 23, 1934, 87 y.o.   MRN: [id number] ? ?This visit occurred during the SARS-CoV-2 public health emergency.  Safety protocols were in place, including screening questions prior to the visit, additional usage of staff PPE, and extensive cleaning of exam room while observing appropriate contact time as indicated for disinfecting solutions.   ? ? ?HPI ?Anna Adams is here for follow up of her chronic medical problems, including htn, hypothyroid, hld, prediabetes, gerd, OA, R sided trigeminal pain.  She is here with her son-in-law ? ?Spinal stenosis has gotten worse.  Steroid did not help.  Pain is significant - wondered about trying tramadol.   ? ?No falls since here last. ? ? ?Has lost weight - eating normally.  Did not realize she lost weight.  ? ? ?Medications and allergies reviewed with patient and updated if appropriate. ? ?Current Outpatient Medications on File Prior to Visit  ?Medication Sig Dispense Refill  ? amLODipine (NORVASC) 5 MG tablet TAKE 1 TABLET BY MOUTH EVERY DAY 90 tablet 3  ? atorvastatin (LIPITOR) 20 MG tablet TAKE 1 TABLET BY MOUTH EVERY DAY 90 tablet 1  ? b complex vitamins tablet Take 1 tablet by mouth daily.    ? Biotin 10000 MCG TABS See admin instructions.    ? cholecalciferol (VITAMIN D) 1000 UNITS tablet Take 400 Units by mouth daily.    ? etodolac (LODINE XL) 400 MG 24 hr tablet TAKE 1 TABLET DAILY 90 tablet 1  ? famotidine (PEPCID) 20 MG tablet TAKE 1 TABLET BY MOUTH EVERY DAY 90 tablet 1  ? hydrocortisone-pramoxine (ANALPRAM HC) 2.5-1 % rectal cream Place 1 application rectally 3 (three) times daily. 30 g 0  ? latanoprost (XALATAN) 0.005 % ophthalmic solution     ? meclizine (ANTIVERT) 25 MG tablet Take 1 tablet (25 mg total) by mouth 3 (three) times daily as needed for dizziness. 30 tablet 5  ? OXcarbazepine (TRILEPTAL) 150 MG tablet TAKE 1 TABLET BY MOUTH TWICE A DAY 180 tablet 0  ? Pramoxine-HC (HYDROCORTISONE  ACE-PRAMOXINE) 2.5-1 % CREA Place rectally.    ? promethazine (PHENERGAN) 25 MG tablet Take 1 tablet (25 mg total) by mouth every 6 (six) hours as needed for nausea or vomiting. 30 tablet 0  ? PROVENTIL HFA 108 (90 Base) MCG/ACT inhaler INHALE 1-2 PUFFS INTO THE LUNGS EVERY 6 (SIX) HOURS AS NEEDED FOR WHEEZING. 6.7 Inhaler 7  ? SYNTHROID 112 MCG tablet TAKE 1 TABLET BY MOUTH EVERY DAY 90 tablet 1  ? telmisartan (MICARDIS) 80 MG tablet TAKE 1 TABLET BY MOUTH EVERY DAY 90 tablet 1  ? UNABLE TO FIND at bedtime. Osteo-ease    ? vitamin E 200 UNIT capsule Take 200 Units by mouth daily.    ? zoledronic acid (RECLAST) 5 MG/100ML SOLN injection See admin instructions.    ? ?No current facility-administered medications on file prior to visit.  ? ? ? ?Review of Systems  ?Constitutional:  Negative for appetite change, chills and fever.  ?Respiratory:  Positive for shortness of breath (chronic). Negative for cough and wheezing.   ?Cardiovascular:  Positive for leg swelling. Negative for chest pain and palpitations.  ?Musculoskeletal:  Positive for arthralgias and back pain.  ?Neurological:  Negative for light-headedness and headaches.  ?Psychiatric/Behavioral:  Negative for sleep disturbance.   ? ?   ?Objective:  ? ?Vitals:  ? 05/19/21 1302  ?BP: 136/80  ?Pulse: 73  ?Temp: 98 ?F (  36.7 ?C)  ?SpO2: 95%  ? ?BP Readings from Last 3 Encounters:  ?05/19/21 136/80  ?03/24/21 (!) 156/92  ?03/02/21 (!) 148/79  ? ?Wt Readings from Last 3 Encounters:  ?05/19/21 144 lb (65.3 kg)  ?01/03/21 167 lb (75.8 kg)  ?11/18/20 159 lb (72.1 kg)  ? ?Body mass index is 29.08 kg/m?. ? ?  ?Physical Exam ?Constitutional:   ?   General: She is not in acute distress. ?   Appearance: Normal appearance.  ?HENT:  ?   Head: Normocephalic and atraumatic.  ?Eyes:  ?   Conjunctiva/sclera: Conjunctivae normal.  ?Cardiovascular:  ?   Rate and Rhythm: Normal rate and regular rhythm.  ?   Heart sounds: Murmur (2/6 sys) heard.  ?Pulmonary:  ?   Effort: Pulmonary effort  is normal. No respiratory distress.  ?   Breath sounds: Normal breath sounds. No wheezing.  ?Musculoskeletal:  ?   Cervical back: Neck supple.  ?   Right lower leg: Edema (mild, non pitting) present.  ?   Left lower leg: Edema (mild, non pitting) present.  ?Lymphadenopathy:  ?   Cervical: No cervical adenopathy.  ?Skin: ?   General: Skin is warm and dry.  ?   Findings: No rash.  ?Neurological:  ?   Mental Status: She is alert. Mental status is at baseline.  ?Psychiatric:     ?   Mood and Affect: Mood normal.     ?   Behavior: Behavior normal.  ? ?   ? ?Lab Results  ?Component Value Date  ? WBC 6.6 05/20/2020  ? HGB 13.4 05/20/2020  ? HCT 40.7 05/20/2020  ? PLT 232.0 05/20/2020  ? GLUCOSE 77 11/18/2020  ? CHOL 197 11/18/2020  ? TRIG 70.0 11/18/2020  ? HDL 94.80 11/18/2020  ? [place] 88 11/18/2020  ? ALT 11 11/18/2020  ? AST 25 11/18/2020  ? NA 136 11/18/2020  ? K 4.3 11/18/2020  ? CL 97 11/18/2020  ? CREATININE 0.81 11/18/2020  ? BUN 15 11/18/2020  ? CO2 31 11/18/2020  ? TSH 3.26 05/20/2020  ? HGBA1C 5.6 11/18/2020  ? ? ? ?Assessment & Plan:  ? ? ?See Problem List for Assessment and Plan of chronic medical problems.  ? ? ?

## 2021-05-19 ENCOUNTER — Ambulatory Visit (INDEPENDENT_AMBULATORY_CARE_PROVIDER_SITE_OTHER): Payer: Medicare Other | Admitting: Internal Medicine

## 2021-05-19 VITALS — BP 136/80 | HR 73 | Temp 98.0°F | Ht 59.0 in | Wt 144.0 lb

## 2021-05-19 DIAGNOSIS — M48061 Spinal stenosis, lumbar region without neurogenic claudication: Secondary | ICD-10-CM | POA: Diagnosis not present

## 2021-05-19 DIAGNOSIS — R011 Cardiac murmur, unspecified: Secondary | ICD-10-CM | POA: Diagnosis not present

## 2021-05-19 DIAGNOSIS — R7303 Prediabetes: Secondary | ICD-10-CM | POA: Diagnosis not present

## 2021-05-19 DIAGNOSIS — E785 Hyperlipidemia, unspecified: Secondary | ICD-10-CM

## 2021-05-19 DIAGNOSIS — K219 Gastro-esophageal reflux disease without esophagitis: Secondary | ICD-10-CM | POA: Diagnosis not present

## 2021-05-19 DIAGNOSIS — I10 Essential (primary) hypertension: Secondary | ICD-10-CM

## 2021-05-19 DIAGNOSIS — E213 Hyperparathyroidism, unspecified: Secondary | ICD-10-CM | POA: Diagnosis not present

## 2021-05-19 DIAGNOSIS — E039 Hypothyroidism, unspecified: Secondary | ICD-10-CM | POA: Diagnosis not present

## 2021-05-19 DIAGNOSIS — G500 Trigeminal neuralgia: Secondary | ICD-10-CM | POA: Diagnosis not present

## 2021-05-19 DIAGNOSIS — M159 Polyosteoarthritis, unspecified: Secondary | ICD-10-CM

## 2021-05-19 LAB — COMPREHENSIVE METABOLIC PANEL
ALT: 11 U/L (ref 0–35)
AST: 22 U/L (ref 0–37)
Albumin: 4.6 g/dL (ref 3.5–5.2)
Alkaline Phosphatase: 60 U/L (ref 39–117)
BUN: 28 mg/dL — ABNORMAL HIGH (ref 6–23)
CO2: 29 mEq/L (ref 19–32)
Calcium: 11.3 mg/dL — ABNORMAL HIGH (ref 8.4–10.5)
Chloride: 95 mEq/L — ABNORMAL LOW (ref 96–112)
Creatinine, Ser: 0.91 mg/dL (ref 0.40–1.20)
GFR: 57.63 mL/min — ABNORMAL LOW (ref >60.00)
Glucose, Bld: 112 mg/dL — ABNORMAL HIGH (ref 70–99)
Potassium: 4.1 mEq/L (ref 3.5–5.1)
Sodium: 133 mEq/L — ABNORMAL LOW (ref 135–145)
Total Bilirubin: 0.7 mg/dL (ref 0.2–1.2)
Total Protein: 7.2 g/dL (ref 6.0–8.3)

## 2021-05-19 LAB — CBC WITH DIFFERENTIAL/PLATELET
Basophils Absolute: 0 10*3/uL (ref 0.0–0.1)
Basophils Relative: 0.5 % (ref 0.0–3.0)
Eosinophils Absolute: 0.1 10*3/uL (ref 0.0–0.7)
Eosinophils Relative: 0.9 % (ref 0.0–5.0)
HCT: 40.8 % (ref 36.0–46.0)
Hemoglobin: 13.4 g/dL (ref 12.0–15.0)
Lymphocytes Relative: 20.3 % (ref 12.0–46.0)
Lymphs Abs: 1.5 10*3/uL (ref 0.7–4.0)
MCHC: 32.8 g/dL (ref 30.0–36.0)
MCV: 90.4 fl (ref 78.0–100.0)
Monocytes Absolute: 0.6 10*3/uL (ref 0.1–1.0)
Monocytes Relative: 8.2 % (ref 3.0–12.0)
Neutro Abs: 5.3 10*3/uL (ref 1.4–7.7)
Neutrophils Relative %: 70.1 % (ref 43.0–77.0)
Platelets: 261 10*3/uL (ref 150.0–400.0)
RBC: 4.51 Mil/uL (ref 3.87–5.11)
RDW: 13.5 % (ref 11.5–15.5)
WBC: 7.5 10*3/uL (ref 4.0–10.5)

## 2021-05-19 LAB — LIPID PANEL
Cholesterol: 214 mg/dL — ABNORMAL HIGH (ref 0–200)
HDL: 103.3 mg/dL (ref >39.00)
LDL Cholesterol: 96 mg/dL (ref 0–99)
NonHDL: 110.38
Total CHOL/HDL Ratio: 2
Triglycerides: 71 mg/dL (ref 0.0–149.0)
VLDL: 14.2 mg/dL (ref 0.0–40.0)

## 2021-05-19 LAB — HEMOGLOBIN A1C: Hgb A1c MFr Bld: 5.8 % (ref 4.6–6.5)

## 2021-05-19 MED ORDER — TRAMADOL HCL 50 MG PO TABS
50.0000 mg | ORAL_TABLET | Freq: Three times a day (TID) | ORAL | 0 refills | Status: AC | PRN
Start: 2021-05-19 — End: 2021-05-24

## 2021-05-19 NOTE — Assessment & Plan Note (Signed)
Chronic Check a1c Low sugar / carb diet Stressed regular exercise  

## 2021-05-19 NOTE — Assessment & Plan Note (Signed)
Chronic GERD controlled Continue famotidine 20 mg daily 

## 2021-05-19 NOTE — Assessment & Plan Note (Signed)
Chronic ?Following with Dr. Jaynee Eagles ?On Trileptal 150 mg twice daily ?

## 2021-05-19 NOTE — Assessment & Plan Note (Signed)
Chronic ?Monitored by Dr. Chalmers Cater ?

## 2021-05-19 NOTE — Assessment & Plan Note (Signed)
Chronic ?Management per Dr. Chalmers Cater ?

## 2021-05-19 NOTE — Assessment & Plan Note (Signed)
Chronic ?Blood pressure well controlled ?CMP ?Continue amlodipine 5 mg daily, telmisartan 80 mg daily ?

## 2021-05-19 NOTE — Assessment & Plan Note (Signed)
Chronic °Regular exercise and healthy diet encouraged °Check lipid panel  °Continue atorvastatin 20 mg daily °

## 2021-05-19 NOTE — Assessment & Plan Note (Addendum)
Chronic ?Not a surgical candidate ?Pain is severe and limiting her ?She wonders about tramadol - which I think is reasonable to try ?Discussed possible side effects ?[place] controlled substance database checked.  ?Trial of tramadol 50 mg Q 8 hr prn x 5 days - will refill if helps and tolerated well ?Start bowel regimen ? ?

## 2021-05-19 NOTE — Assessment & Plan Note (Signed)
Chronic Continue etodolac 400 mg daily 

## 2021-05-27 ENCOUNTER — Telehealth: Payer: Self-pay | Admitting: Internal Medicine

## 2021-05-27 NOTE — Telephone Encounter (Signed)
Mailed out today

## 2021-05-27 NOTE — Telephone Encounter (Signed)
Pt requesting a hard copy of 05-19-2021 lab results mailed to home address ? ?

## 2021-06-01 ENCOUNTER — Ambulatory Visit (HOSPITAL_COMMUNITY): Payer: Medicare Other | Attending: Cardiology

## 2021-06-01 DIAGNOSIS — R011 Cardiac murmur, unspecified: Secondary | ICD-10-CM | POA: Diagnosis not present

## 2021-06-01 LAB — ECHOCARDIOGRAM COMPLETE
Area-P 1/2: 2.94 cm2
P 1/2 time: 720 msec
S' Lateral: 1.9 cm

## 2021-06-11 ENCOUNTER — Telehealth: Payer: Self-pay | Admitting: Internal Medicine

## 2021-06-11 NOTE — Telephone Encounter (Signed)
Patient [facility] [address] Patient states that she spoke with Dr. Quay Burow about this last month - it is for back pain. Patient was offered an appointment and she declined.

## 2021-06-12 MED ORDER — TRAMADOL HCL 50 MG PO TABS
50.0000 mg | ORAL_TABLET | Freq: Three times a day (TID) | ORAL | 0 refills | Status: DC | PRN
Start: 2021-06-12 — End: 2021-09-14

## 2021-06-12 NOTE — Telephone Encounter (Signed)
Prescription sent

## 2021-06-13 ENCOUNTER — Other Ambulatory Visit: Payer: Self-pay | Admitting: Internal Medicine

## 2021-06-18 ENCOUNTER — Telehealth: Payer: Self-pay | Admitting: Internal Medicine

## 2021-06-18 NOTE — Telephone Encounter (Signed)
Anna Adams (Key: [id number])  Your information has been submitted to [place]. To check for an updated outcome later, reopen this PA request from your dashboard.  If [employer] has not responded to your request within 24 hours, contact [place] at [phone]. If you think there may be a problem with your PA request, use our live chat feature at the bottom right.  Patient didn't pick up script sent in last Friday. Called pharmacy to make sure it had been received. It required a prior authorization which was submitted today.  Left message for patient today regarding this.

## 2021-06-18 NOTE — Telephone Encounter (Signed)
1.Medication Requested: traMADol (ULTRAM) 50 MG tablet 2. Pharmacy (Name, Street, [place]): [id number] [place] [address] Phone:  [phone] Fax:  [phone]    3. On Med List: yes  4. Last Visit with PCP:  5. Next visit date with PCP:   Agent: Please be advised that RX refills may take up to 3 business days. We ask that you follow-up with your pharmacy.

## 2021-06-18 NOTE — Telephone Encounter (Signed)
Message left for patient to return call to clinic.

## 2021-06-18 NOTE — Telephone Encounter (Signed)
I sent a refill in on 5/19 for 90 pills or a month worth - how many is she taking in one day?

## 2021-06-23 NOTE — Telephone Encounter (Signed)
Message left for patient that medication has been approved.  She should contact pharmacy for pick up.

## 2021-06-23 NOTE — Telephone Encounter (Signed)
Outcome Approvedon May 25 Your PA request has been approved. Additional information will be provided in the approval communication. (Message 1145) Drug traMADol HCl '50MG'$  tablets Form [employer] Electronic PA Form (2017 NCPDP) Original Claim Info 75 MAX 7 DS PER90 DAYS THEN PA. PA REQ [id number] REQUIRES PRIOR AUTHORIZATION

## 2021-06-29 ENCOUNTER — Ambulatory Visit: Payer: Medicare Other | Admitting: Podiatry

## 2021-07-02 DIAGNOSIS — I10 Essential (primary) hypertension: Secondary | ICD-10-CM | POA: Diagnosis not present

## 2021-07-02 DIAGNOSIS — M47817 Spondylosis without myelopathy or radiculopathy, lumbosacral region: Secondary | ICD-10-CM | POA: Diagnosis not present

## 2021-07-02 DIAGNOSIS — E039 Hypothyroidism, unspecified: Secondary | ICD-10-CM | POA: Diagnosis not present

## 2021-07-02 DIAGNOSIS — M5137 Other intervertebral disc degeneration, lumbosacral region: Secondary | ICD-10-CM | POA: Diagnosis not present

## 2021-07-02 DIAGNOSIS — M4807 Spinal stenosis, lumbosacral region: Secondary | ICD-10-CM | POA: Diagnosis not present

## 2021-07-02 DIAGNOSIS — M4317 Spondylolisthesis, lumbosacral region: Secondary | ICD-10-CM | POA: Diagnosis not present

## 2021-07-02 DIAGNOSIS — M810 Age-related osteoporosis without current pathological fracture: Secondary | ICD-10-CM | POA: Diagnosis not present

## 2021-07-02 DIAGNOSIS — J45909 Unspecified asthma, uncomplicated: Secondary | ICD-10-CM | POA: Diagnosis not present

## 2021-07-02 DIAGNOSIS — Z556 Problems related to health literacy: Secondary | ICD-10-CM | POA: Diagnosis not present

## 2021-07-06 DIAGNOSIS — M5137 Other intervertebral disc degeneration, lumbosacral region: Secondary | ICD-10-CM | POA: Diagnosis not present

## 2021-07-06 DIAGNOSIS — M47817 Spondylosis without myelopathy or radiculopathy, lumbosacral region: Secondary | ICD-10-CM | POA: Diagnosis not present

## 2021-07-06 DIAGNOSIS — M4317 Spondylolisthesis, lumbosacral region: Secondary | ICD-10-CM | POA: Diagnosis not present

## 2021-07-06 DIAGNOSIS — I10 Essential (primary) hypertension: Secondary | ICD-10-CM | POA: Diagnosis not present

## 2021-07-06 DIAGNOSIS — M4807 Spinal stenosis, lumbosacral region: Secondary | ICD-10-CM | POA: Diagnosis not present

## 2021-07-06 DIAGNOSIS — J45909 Unspecified asthma, uncomplicated: Secondary | ICD-10-CM | POA: Diagnosis not present

## 2021-07-08 DIAGNOSIS — M47817 Spondylosis without myelopathy or radiculopathy, lumbosacral region: Secondary | ICD-10-CM | POA: Diagnosis not present

## 2021-07-08 DIAGNOSIS — M4807 Spinal stenosis, lumbosacral region: Secondary | ICD-10-CM | POA: Diagnosis not present

## 2021-07-08 DIAGNOSIS — M5137 Other intervertebral disc degeneration, lumbosacral region: Secondary | ICD-10-CM | POA: Diagnosis not present

## 2021-07-08 DIAGNOSIS — I10 Essential (primary) hypertension: Secondary | ICD-10-CM | POA: Diagnosis not present

## 2021-07-08 DIAGNOSIS — J45909 Unspecified asthma, uncomplicated: Secondary | ICD-10-CM | POA: Diagnosis not present

## 2021-07-08 DIAGNOSIS — M4317 Spondylolisthesis, lumbosacral region: Secondary | ICD-10-CM | POA: Diagnosis not present

## 2021-07-09 DIAGNOSIS — R7301 Impaired fasting glucose: Secondary | ICD-10-CM | POA: Diagnosis not present

## 2021-07-09 DIAGNOSIS — E210 Primary hyperparathyroidism: Secondary | ICD-10-CM | POA: Diagnosis not present

## 2021-07-09 DIAGNOSIS — E890 Postprocedural hypothyroidism: Secondary | ICD-10-CM | POA: Diagnosis not present

## 2021-07-09 DIAGNOSIS — M810 Age-related osteoporosis without current pathological fracture: Secondary | ICD-10-CM | POA: Diagnosis not present

## 2021-07-09 DIAGNOSIS — I10 Essential (primary) hypertension: Secondary | ICD-10-CM | POA: Diagnosis not present

## 2021-07-10 DIAGNOSIS — I10 Essential (primary) hypertension: Secondary | ICD-10-CM | POA: Diagnosis not present

## 2021-07-10 DIAGNOSIS — M5137 Other intervertebral disc degeneration, lumbosacral region: Secondary | ICD-10-CM | POA: Diagnosis not present

## 2021-07-10 DIAGNOSIS — M4317 Spondylolisthesis, lumbosacral region: Secondary | ICD-10-CM | POA: Diagnosis not present

## 2021-07-10 DIAGNOSIS — M47817 Spondylosis without myelopathy or radiculopathy, lumbosacral region: Secondary | ICD-10-CM | POA: Diagnosis not present

## 2021-07-10 DIAGNOSIS — M4807 Spinal stenosis, lumbosacral region: Secondary | ICD-10-CM | POA: Diagnosis not present

## 2021-07-10 DIAGNOSIS — J45909 Unspecified asthma, uncomplicated: Secondary | ICD-10-CM | POA: Diagnosis not present

## 2021-07-13 DIAGNOSIS — M4807 Spinal stenosis, lumbosacral region: Secondary | ICD-10-CM | POA: Diagnosis not present

## 2021-07-13 DIAGNOSIS — M5137 Other intervertebral disc degeneration, lumbosacral region: Secondary | ICD-10-CM | POA: Diagnosis not present

## 2021-07-13 DIAGNOSIS — J45909 Unspecified asthma, uncomplicated: Secondary | ICD-10-CM | POA: Diagnosis not present

## 2021-07-13 DIAGNOSIS — I10 Essential (primary) hypertension: Secondary | ICD-10-CM | POA: Diagnosis not present

## 2021-07-13 DIAGNOSIS — M4317 Spondylolisthesis, lumbosacral region: Secondary | ICD-10-CM | POA: Diagnosis not present

## 2021-07-13 DIAGNOSIS — M47817 Spondylosis without myelopathy or radiculopathy, lumbosacral region: Secondary | ICD-10-CM | POA: Diagnosis not present

## 2021-07-15 DIAGNOSIS — M5137 Other intervertebral disc degeneration, lumbosacral region: Secondary | ICD-10-CM | POA: Diagnosis not present

## 2021-07-15 DIAGNOSIS — M4807 Spinal stenosis, lumbosacral region: Secondary | ICD-10-CM | POA: Diagnosis not present

## 2021-07-15 DIAGNOSIS — I10 Essential (primary) hypertension: Secondary | ICD-10-CM | POA: Diagnosis not present

## 2021-07-15 DIAGNOSIS — J45909 Unspecified asthma, uncomplicated: Secondary | ICD-10-CM | POA: Diagnosis not present

## 2021-07-15 DIAGNOSIS — M4317 Spondylolisthesis, lumbosacral region: Secondary | ICD-10-CM | POA: Diagnosis not present

## 2021-07-15 DIAGNOSIS — M47817 Spondylosis without myelopathy or radiculopathy, lumbosacral region: Secondary | ICD-10-CM | POA: Diagnosis not present

## 2021-07-16 DIAGNOSIS — M47817 Spondylosis without myelopathy or radiculopathy, lumbosacral region: Secondary | ICD-10-CM | POA: Diagnosis not present

## 2021-07-16 DIAGNOSIS — M4807 Spinal stenosis, lumbosacral region: Secondary | ICD-10-CM | POA: Diagnosis not present

## 2021-07-16 DIAGNOSIS — I10 Essential (primary) hypertension: Secondary | ICD-10-CM | POA: Diagnosis not present

## 2021-07-16 DIAGNOSIS — M5137 Other intervertebral disc degeneration, lumbosacral region: Secondary | ICD-10-CM | POA: Diagnosis not present

## 2021-07-16 DIAGNOSIS — M4317 Spondylolisthesis, lumbosacral region: Secondary | ICD-10-CM | POA: Diagnosis not present

## 2021-07-16 DIAGNOSIS — J45909 Unspecified asthma, uncomplicated: Secondary | ICD-10-CM | POA: Diagnosis not present

## 2021-07-21 DIAGNOSIS — M4317 Spondylolisthesis, lumbosacral region: Secondary | ICD-10-CM | POA: Diagnosis not present

## 2021-07-21 DIAGNOSIS — M4807 Spinal stenosis, lumbosacral region: Secondary | ICD-10-CM | POA: Diagnosis not present

## 2021-07-21 DIAGNOSIS — I10 Essential (primary) hypertension: Secondary | ICD-10-CM | POA: Diagnosis not present

## 2021-07-21 DIAGNOSIS — J45909 Unspecified asthma, uncomplicated: Secondary | ICD-10-CM | POA: Diagnosis not present

## 2021-07-21 DIAGNOSIS — M5137 Other intervertebral disc degeneration, lumbosacral region: Secondary | ICD-10-CM | POA: Diagnosis not present

## 2021-07-21 DIAGNOSIS — M47817 Spondylosis without myelopathy or radiculopathy, lumbosacral region: Secondary | ICD-10-CM | POA: Diagnosis not present

## 2021-07-24 DIAGNOSIS — I10 Essential (primary) hypertension: Secondary | ICD-10-CM | POA: Diagnosis not present

## 2021-07-24 DIAGNOSIS — M4807 Spinal stenosis, lumbosacral region: Secondary | ICD-10-CM | POA: Diagnosis not present

## 2021-07-24 DIAGNOSIS — M5137 Other intervertebral disc degeneration, lumbosacral region: Secondary | ICD-10-CM | POA: Diagnosis not present

## 2021-07-24 DIAGNOSIS — M47817 Spondylosis without myelopathy or radiculopathy, lumbosacral region: Secondary | ICD-10-CM | POA: Diagnosis not present

## 2021-07-24 DIAGNOSIS — M4317 Spondylolisthesis, lumbosacral region: Secondary | ICD-10-CM | POA: Diagnosis not present

## 2021-07-24 DIAGNOSIS — J45909 Unspecified asthma, uncomplicated: Secondary | ICD-10-CM | POA: Diagnosis not present

## 2021-07-29 DIAGNOSIS — M4317 Spondylolisthesis, lumbosacral region: Secondary | ICD-10-CM | POA: Diagnosis not present

## 2021-07-29 DIAGNOSIS — M4807 Spinal stenosis, lumbosacral region: Secondary | ICD-10-CM | POA: Diagnosis not present

## 2021-07-29 DIAGNOSIS — M5137 Other intervertebral disc degeneration, lumbosacral region: Secondary | ICD-10-CM | POA: Diagnosis not present

## 2021-07-29 DIAGNOSIS — M47817 Spondylosis without myelopathy or radiculopathy, lumbosacral region: Secondary | ICD-10-CM | POA: Diagnosis not present

## 2021-07-29 DIAGNOSIS — I10 Essential (primary) hypertension: Secondary | ICD-10-CM | POA: Diagnosis not present

## 2021-07-29 DIAGNOSIS — J45909 Unspecified asthma, uncomplicated: Secondary | ICD-10-CM | POA: Diagnosis not present

## 2021-08-01 DIAGNOSIS — I10 Essential (primary) hypertension: Secondary | ICD-10-CM | POA: Diagnosis not present

## 2021-08-01 DIAGNOSIS — M4317 Spondylolisthesis, lumbosacral region: Secondary | ICD-10-CM | POA: Diagnosis not present

## 2021-08-01 DIAGNOSIS — M4807 Spinal stenosis, lumbosacral region: Secondary | ICD-10-CM | POA: Diagnosis not present

## 2021-08-01 DIAGNOSIS — M810 Age-related osteoporosis without current pathological fracture: Secondary | ICD-10-CM | POA: Diagnosis not present

## 2021-08-01 DIAGNOSIS — M5137 Other intervertebral disc degeneration, lumbosacral region: Secondary | ICD-10-CM | POA: Diagnosis not present

## 2021-08-01 DIAGNOSIS — Z556 Problems related to health literacy: Secondary | ICD-10-CM | POA: Diagnosis not present

## 2021-08-01 DIAGNOSIS — J45909 Unspecified asthma, uncomplicated: Secondary | ICD-10-CM | POA: Diagnosis not present

## 2021-08-01 DIAGNOSIS — E039 Hypothyroidism, unspecified: Secondary | ICD-10-CM | POA: Diagnosis not present

## 2021-08-01 DIAGNOSIS — M47817 Spondylosis without myelopathy or radiculopathy, lumbosacral region: Secondary | ICD-10-CM | POA: Diagnosis not present

## 2021-08-04 ENCOUNTER — Other Ambulatory Visit: Payer: Self-pay | Admitting: Orthopedic Surgery

## 2021-08-04 ENCOUNTER — Telehealth: Payer: Self-pay | Admitting: Internal Medicine

## 2021-08-04 ENCOUNTER — Telehealth: Payer: Self-pay | Admitting: Surgical

## 2021-08-04 DIAGNOSIS — J45909 Unspecified asthma, uncomplicated: Secondary | ICD-10-CM | POA: Diagnosis not present

## 2021-08-04 DIAGNOSIS — M47817 Spondylosis without myelopathy or radiculopathy, lumbosacral region: Secondary | ICD-10-CM | POA: Diagnosis not present

## 2021-08-04 DIAGNOSIS — M4807 Spinal stenosis, lumbosacral region: Secondary | ICD-10-CM | POA: Diagnosis not present

## 2021-08-04 DIAGNOSIS — M5137 Other intervertebral disc degeneration, lumbosacral region: Secondary | ICD-10-CM | POA: Diagnosis not present

## 2021-08-04 DIAGNOSIS — I10 Essential (primary) hypertension: Secondary | ICD-10-CM | POA: Diagnosis not present

## 2021-08-04 DIAGNOSIS — M4317 Spondylolisthesis, lumbosacral region: Secondary | ICD-10-CM | POA: Diagnosis not present

## 2021-08-04 NOTE — Telephone Encounter (Signed)
Cara from [facility] called and requested a prednisone patch for Cing due to her chronic lower back pain.   Please advise  CB: Moshe Salisbury [phone]

## 2021-08-04 NOTE — Telephone Encounter (Signed)
Patient called in for a prednisone pack sent to [facility] on [address]

## 2021-08-04 NOTE — Telephone Encounter (Signed)
Spoke with patient and recommended she reach out to Dr. Randel Pigg office since they are currently treating her for her back pain.

## 2021-08-05 ENCOUNTER — Telehealth: Payer: Self-pay | Admitting: Surgical

## 2021-08-05 NOTE — Telephone Encounter (Signed)
Sent in a steroid dosepack

## 2021-08-05 NOTE — Telephone Encounter (Signed)
Tried calling patient to discuss Luke's note. No answer. LMVM for her to return my call.

## 2021-08-05 NOTE — Telephone Encounter (Signed)
Called back and stated that she can not make any office appts. Pt states its lots of back pain. She has no transportation and her children can not bring her too her appt and would like to try the prednisone pack. Please send to pharmacy on file. Pt phone number is [phone].

## 2021-08-05 NOTE — Telephone Encounter (Signed)
I think if she is asking for this for her back pain, I think returning to Dr Ernestina Patches for another injection would be more helpful.  If it is for a new problem that she is having pain in another joint, I think she should be evaluated before a prescription.

## 2021-08-06 NOTE — Telephone Encounter (Signed)
IC LM for patient

## 2021-08-12 DIAGNOSIS — J45909 Unspecified asthma, uncomplicated: Secondary | ICD-10-CM | POA: Diagnosis not present

## 2021-08-12 DIAGNOSIS — I10 Essential (primary) hypertension: Secondary | ICD-10-CM | POA: Diagnosis not present

## 2021-08-12 DIAGNOSIS — M5137 Other intervertebral disc degeneration, lumbosacral region: Secondary | ICD-10-CM | POA: Diagnosis not present

## 2021-08-12 DIAGNOSIS — M4317 Spondylolisthesis, lumbosacral region: Secondary | ICD-10-CM | POA: Diagnosis not present

## 2021-08-12 DIAGNOSIS — M47817 Spondylosis without myelopathy or radiculopathy, lumbosacral region: Secondary | ICD-10-CM | POA: Diagnosis not present

## 2021-08-12 DIAGNOSIS — M4807 Spinal stenosis, lumbosacral region: Secondary | ICD-10-CM | POA: Diagnosis not present

## 2021-08-17 DIAGNOSIS — M810 Age-related osteoporosis without current pathological fracture: Secondary | ICD-10-CM | POA: Diagnosis not present

## 2021-08-18 DIAGNOSIS — I10 Essential (primary) hypertension: Secondary | ICD-10-CM | POA: Diagnosis not present

## 2021-08-18 DIAGNOSIS — M47817 Spondylosis without myelopathy or radiculopathy, lumbosacral region: Secondary | ICD-10-CM | POA: Diagnosis not present

## 2021-08-18 DIAGNOSIS — M5137 Other intervertebral disc degeneration, lumbosacral region: Secondary | ICD-10-CM | POA: Diagnosis not present

## 2021-08-18 DIAGNOSIS — J45909 Unspecified asthma, uncomplicated: Secondary | ICD-10-CM | POA: Diagnosis not present

## 2021-08-18 DIAGNOSIS — M4807 Spinal stenosis, lumbosacral region: Secondary | ICD-10-CM | POA: Diagnosis not present

## 2021-08-18 DIAGNOSIS — M4317 Spondylolisthesis, lumbosacral region: Secondary | ICD-10-CM | POA: Diagnosis not present

## 2021-08-19 DIAGNOSIS — M47817 Spondylosis without myelopathy or radiculopathy, lumbosacral region: Secondary | ICD-10-CM | POA: Diagnosis not present

## 2021-08-19 DIAGNOSIS — M5137 Other intervertebral disc degeneration, lumbosacral region: Secondary | ICD-10-CM | POA: Diagnosis not present

## 2021-08-19 DIAGNOSIS — M4807 Spinal stenosis, lumbosacral region: Secondary | ICD-10-CM | POA: Diagnosis not present

## 2021-08-19 DIAGNOSIS — M4317 Spondylolisthesis, lumbosacral region: Secondary | ICD-10-CM | POA: Diagnosis not present

## 2021-08-19 DIAGNOSIS — J45909 Unspecified asthma, uncomplicated: Secondary | ICD-10-CM | POA: Diagnosis not present

## 2021-08-19 DIAGNOSIS — I10 Essential (primary) hypertension: Secondary | ICD-10-CM | POA: Diagnosis not present

## 2021-08-26 DIAGNOSIS — M47817 Spondylosis without myelopathy or radiculopathy, lumbosacral region: Secondary | ICD-10-CM | POA: Diagnosis not present

## 2021-08-26 DIAGNOSIS — M4807 Spinal stenosis, lumbosacral region: Secondary | ICD-10-CM | POA: Diagnosis not present

## 2021-08-26 DIAGNOSIS — I10 Essential (primary) hypertension: Secondary | ICD-10-CM | POA: Diagnosis not present

## 2021-08-26 DIAGNOSIS — J45909 Unspecified asthma, uncomplicated: Secondary | ICD-10-CM | POA: Diagnosis not present

## 2021-08-26 DIAGNOSIS — M5137 Other intervertebral disc degeneration, lumbosacral region: Secondary | ICD-10-CM | POA: Diagnosis not present

## 2021-08-26 DIAGNOSIS — M4317 Spondylolisthesis, lumbosacral region: Secondary | ICD-10-CM | POA: Diagnosis not present

## 2021-08-31 DIAGNOSIS — M48061 Spinal stenosis, lumbar region without neurogenic claudication: Secondary | ICD-10-CM | POA: Diagnosis not present

## 2021-08-31 DIAGNOSIS — M810 Age-related osteoporosis without current pathological fracture: Secondary | ICD-10-CM | POA: Diagnosis not present

## 2021-08-31 DIAGNOSIS — M545 Low back pain, unspecified: Secondary | ICD-10-CM | POA: Diagnosis not present

## 2021-08-31 DIAGNOSIS — E039 Hypothyroidism, unspecified: Secondary | ICD-10-CM | POA: Diagnosis not present

## 2021-08-31 DIAGNOSIS — G8929 Other chronic pain: Secondary | ICD-10-CM | POA: Diagnosis not present

## 2021-08-31 DIAGNOSIS — Z85828 Personal history of other malignant neoplasm of skin: Secondary | ICD-10-CM | POA: Diagnosis not present

## 2021-08-31 DIAGNOSIS — M47817 Spondylosis without myelopathy or radiculopathy, lumbosacral region: Secondary | ICD-10-CM | POA: Diagnosis not present

## 2021-08-31 DIAGNOSIS — I10 Essential (primary) hypertension: Secondary | ICD-10-CM | POA: Diagnosis not present

## 2021-08-31 DIAGNOSIS — M4317 Spondylolisthesis, lumbosacral region: Secondary | ICD-10-CM | POA: Diagnosis not present

## 2021-08-31 DIAGNOSIS — J45909 Unspecified asthma, uncomplicated: Secondary | ICD-10-CM | POA: Diagnosis not present

## 2021-08-31 DIAGNOSIS — M5137 Other intervertebral disc degeneration, lumbosacral region: Secondary | ICD-10-CM | POA: Diagnosis not present

## 2021-09-01 DIAGNOSIS — M4317 Spondylolisthesis, lumbosacral region: Secondary | ICD-10-CM | POA: Diagnosis not present

## 2021-09-01 DIAGNOSIS — M5137 Other intervertebral disc degeneration, lumbosacral region: Secondary | ICD-10-CM | POA: Diagnosis not present

## 2021-09-01 DIAGNOSIS — J45909 Unspecified asthma, uncomplicated: Secondary | ICD-10-CM | POA: Diagnosis not present

## 2021-09-01 DIAGNOSIS — M48061 Spinal stenosis, lumbar region without neurogenic claudication: Secondary | ICD-10-CM | POA: Diagnosis not present

## 2021-09-01 DIAGNOSIS — M47817 Spondylosis without myelopathy or radiculopathy, lumbosacral region: Secondary | ICD-10-CM | POA: Diagnosis not present

## 2021-09-01 DIAGNOSIS — I10 Essential (primary) hypertension: Secondary | ICD-10-CM | POA: Diagnosis not present

## 2021-09-04 DIAGNOSIS — J45909 Unspecified asthma, uncomplicated: Secondary | ICD-10-CM | POA: Diagnosis not present

## 2021-09-04 DIAGNOSIS — M5137 Other intervertebral disc degeneration, lumbosacral region: Secondary | ICD-10-CM | POA: Diagnosis not present

## 2021-09-04 DIAGNOSIS — M47817 Spondylosis without myelopathy or radiculopathy, lumbosacral region: Secondary | ICD-10-CM | POA: Diagnosis not present

## 2021-09-04 DIAGNOSIS — I10 Essential (primary) hypertension: Secondary | ICD-10-CM | POA: Diagnosis not present

## 2021-09-04 DIAGNOSIS — M4317 Spondylolisthesis, lumbosacral region: Secondary | ICD-10-CM | POA: Diagnosis not present

## 2021-09-04 DIAGNOSIS — M48061 Spinal stenosis, lumbar region without neurogenic claudication: Secondary | ICD-10-CM | POA: Diagnosis not present

## 2021-09-08 DIAGNOSIS — M47817 Spondylosis without myelopathy or radiculopathy, lumbosacral region: Secondary | ICD-10-CM | POA: Diagnosis not present

## 2021-09-08 DIAGNOSIS — J45909 Unspecified asthma, uncomplicated: Secondary | ICD-10-CM | POA: Diagnosis not present

## 2021-09-08 DIAGNOSIS — M5137 Other intervertebral disc degeneration, lumbosacral region: Secondary | ICD-10-CM | POA: Diagnosis not present

## 2021-09-08 DIAGNOSIS — I10 Essential (primary) hypertension: Secondary | ICD-10-CM | POA: Diagnosis not present

## 2021-09-08 DIAGNOSIS — M48061 Spinal stenosis, lumbar region without neurogenic claudication: Secondary | ICD-10-CM | POA: Diagnosis not present

## 2021-09-08 DIAGNOSIS — M4317 Spondylolisthesis, lumbosacral region: Secondary | ICD-10-CM | POA: Diagnosis not present

## 2021-09-10 DIAGNOSIS — M47817 Spondylosis without myelopathy or radiculopathy, lumbosacral region: Secondary | ICD-10-CM | POA: Diagnosis not present

## 2021-09-10 DIAGNOSIS — M5137 Other intervertebral disc degeneration, lumbosacral region: Secondary | ICD-10-CM | POA: Diagnosis not present

## 2021-09-10 DIAGNOSIS — M4317 Spondylolisthesis, lumbosacral region: Secondary | ICD-10-CM | POA: Diagnosis not present

## 2021-09-10 DIAGNOSIS — J45909 Unspecified asthma, uncomplicated: Secondary | ICD-10-CM | POA: Diagnosis not present

## 2021-09-10 DIAGNOSIS — I10 Essential (primary) hypertension: Secondary | ICD-10-CM | POA: Diagnosis not present

## 2021-09-10 DIAGNOSIS — M48061 Spinal stenosis, lumbar region without neurogenic claudication: Secondary | ICD-10-CM | POA: Diagnosis not present

## 2021-09-11 ENCOUNTER — Other Ambulatory Visit: Payer: Self-pay | Admitting: Internal Medicine

## 2021-09-14 ENCOUNTER — Other Ambulatory Visit: Payer: Self-pay | Admitting: Internal Medicine

## 2021-09-14 DIAGNOSIS — M47817 Spondylosis without myelopathy or radiculopathy, lumbosacral region: Secondary | ICD-10-CM | POA: Diagnosis not present

## 2021-09-14 DIAGNOSIS — M48061 Spinal stenosis, lumbar region without neurogenic claudication: Secondary | ICD-10-CM | POA: Diagnosis not present

## 2021-09-14 DIAGNOSIS — M4317 Spondylolisthesis, lumbosacral region: Secondary | ICD-10-CM | POA: Diagnosis not present

## 2021-09-14 DIAGNOSIS — M5137 Other intervertebral disc degeneration, lumbosacral region: Secondary | ICD-10-CM | POA: Diagnosis not present

## 2021-09-14 DIAGNOSIS — J45909 Unspecified asthma, uncomplicated: Secondary | ICD-10-CM | POA: Diagnosis not present

## 2021-09-14 DIAGNOSIS — I10 Essential (primary) hypertension: Secondary | ICD-10-CM | POA: Diagnosis not present

## 2021-09-16 DIAGNOSIS — M4317 Spondylolisthesis, lumbosacral region: Secondary | ICD-10-CM | POA: Diagnosis not present

## 2021-09-16 DIAGNOSIS — M47817 Spondylosis without myelopathy or radiculopathy, lumbosacral region: Secondary | ICD-10-CM | POA: Diagnosis not present

## 2021-09-16 DIAGNOSIS — I10 Essential (primary) hypertension: Secondary | ICD-10-CM | POA: Diagnosis not present

## 2021-09-16 DIAGNOSIS — J45909 Unspecified asthma, uncomplicated: Secondary | ICD-10-CM | POA: Diagnosis not present

## 2021-09-16 DIAGNOSIS — M48061 Spinal stenosis, lumbar region without neurogenic claudication: Secondary | ICD-10-CM | POA: Diagnosis not present

## 2021-09-16 DIAGNOSIS — M5137 Other intervertebral disc degeneration, lumbosacral region: Secondary | ICD-10-CM | POA: Diagnosis not present

## 2021-09-21 DIAGNOSIS — M48061 Spinal stenosis, lumbar region without neurogenic claudication: Secondary | ICD-10-CM | POA: Diagnosis not present

## 2021-09-21 DIAGNOSIS — M5137 Other intervertebral disc degeneration, lumbosacral region: Secondary | ICD-10-CM | POA: Diagnosis not present

## 2021-09-21 DIAGNOSIS — J45909 Unspecified asthma, uncomplicated: Secondary | ICD-10-CM | POA: Diagnosis not present

## 2021-09-21 DIAGNOSIS — I10 Essential (primary) hypertension: Secondary | ICD-10-CM | POA: Diagnosis not present

## 2021-09-21 DIAGNOSIS — M4317 Spondylolisthesis, lumbosacral region: Secondary | ICD-10-CM | POA: Diagnosis not present

## 2021-09-21 DIAGNOSIS — M47817 Spondylosis without myelopathy or radiculopathy, lumbosacral region: Secondary | ICD-10-CM | POA: Diagnosis not present

## 2021-09-23 DIAGNOSIS — J45909 Unspecified asthma, uncomplicated: Secondary | ICD-10-CM | POA: Diagnosis not present

## 2021-09-23 DIAGNOSIS — M47817 Spondylosis without myelopathy or radiculopathy, lumbosacral region: Secondary | ICD-10-CM | POA: Diagnosis not present

## 2021-09-23 DIAGNOSIS — M5137 Other intervertebral disc degeneration, lumbosacral region: Secondary | ICD-10-CM | POA: Diagnosis not present

## 2021-09-23 DIAGNOSIS — M48061 Spinal stenosis, lumbar region without neurogenic claudication: Secondary | ICD-10-CM | POA: Diagnosis not present

## 2021-09-23 DIAGNOSIS — M4317 Spondylolisthesis, lumbosacral region: Secondary | ICD-10-CM | POA: Diagnosis not present

## 2021-09-23 DIAGNOSIS — I10 Essential (primary) hypertension: Secondary | ICD-10-CM | POA: Diagnosis not present

## 2021-09-29 DIAGNOSIS — M5137 Other intervertebral disc degeneration, lumbosacral region: Secondary | ICD-10-CM | POA: Diagnosis not present

## 2021-09-29 DIAGNOSIS — M48061 Spinal stenosis, lumbar region without neurogenic claudication: Secondary | ICD-10-CM | POA: Diagnosis not present

## 2021-09-29 DIAGNOSIS — J45909 Unspecified asthma, uncomplicated: Secondary | ICD-10-CM | POA: Diagnosis not present

## 2021-09-29 DIAGNOSIS — M4317 Spondylolisthesis, lumbosacral region: Secondary | ICD-10-CM | POA: Diagnosis not present

## 2021-09-29 DIAGNOSIS — I10 Essential (primary) hypertension: Secondary | ICD-10-CM | POA: Diagnosis not present

## 2021-09-29 DIAGNOSIS — M47817 Spondylosis without myelopathy or radiculopathy, lumbosacral region: Secondary | ICD-10-CM | POA: Diagnosis not present

## 2021-09-30 DIAGNOSIS — I10 Essential (primary) hypertension: Secondary | ICD-10-CM | POA: Diagnosis not present

## 2021-09-30 DIAGNOSIS — M4317 Spondylolisthesis, lumbosacral region: Secondary | ICD-10-CM | POA: Diagnosis not present

## 2021-09-30 DIAGNOSIS — M47817 Spondylosis without myelopathy or radiculopathy, lumbosacral region: Secondary | ICD-10-CM | POA: Diagnosis not present

## 2021-09-30 DIAGNOSIS — M5137 Other intervertebral disc degeneration, lumbosacral region: Secondary | ICD-10-CM | POA: Diagnosis not present

## 2021-09-30 DIAGNOSIS — J45909 Unspecified asthma, uncomplicated: Secondary | ICD-10-CM | POA: Diagnosis not present

## 2021-09-30 DIAGNOSIS — G8929 Other chronic pain: Secondary | ICD-10-CM | POA: Diagnosis not present

## 2021-09-30 DIAGNOSIS — M545 Low back pain, unspecified: Secondary | ICD-10-CM | POA: Diagnosis not present

## 2021-09-30 DIAGNOSIS — Z85828 Personal history of other malignant neoplasm of skin: Secondary | ICD-10-CM | POA: Diagnosis not present

## 2021-09-30 DIAGNOSIS — M810 Age-related osteoporosis without current pathological fracture: Secondary | ICD-10-CM | POA: Diagnosis not present

## 2021-09-30 DIAGNOSIS — E039 Hypothyroidism, unspecified: Secondary | ICD-10-CM | POA: Diagnosis not present

## 2021-09-30 DIAGNOSIS — M48061 Spinal stenosis, lumbar region without neurogenic claudication: Secondary | ICD-10-CM | POA: Diagnosis not present

## 2021-10-07 DIAGNOSIS — M48061 Spinal stenosis, lumbar region without neurogenic claudication: Secondary | ICD-10-CM | POA: Diagnosis not present

## 2021-10-07 DIAGNOSIS — M47817 Spondylosis without myelopathy or radiculopathy, lumbosacral region: Secondary | ICD-10-CM | POA: Diagnosis not present

## 2021-10-07 DIAGNOSIS — J45909 Unspecified asthma, uncomplicated: Secondary | ICD-10-CM | POA: Diagnosis not present

## 2021-10-07 DIAGNOSIS — I10 Essential (primary) hypertension: Secondary | ICD-10-CM | POA: Diagnosis not present

## 2021-10-07 DIAGNOSIS — M5137 Other intervertebral disc degeneration, lumbosacral region: Secondary | ICD-10-CM | POA: Diagnosis not present

## 2021-10-07 DIAGNOSIS — M4317 Spondylolisthesis, lumbosacral region: Secondary | ICD-10-CM | POA: Diagnosis not present

## 2021-10-08 ENCOUNTER — Telehealth: Payer: Self-pay | Admitting: Internal Medicine

## 2021-10-08 NOTE — Telephone Encounter (Signed)
Caller & Relationship to patient: Anna Adams -- self  Call back number: [phone]  Date of last office visit: 05-19-21  Date of next office visit: 11-18-21  Medication(s) to be refilled: etodolac (LODINE XL) 400 MG 24 hr tablet  Preferred Pharmacy: [id number] [place], [place] - [address]

## 2021-10-09 MED ORDER — ETODOLAC ER 400 MG PO TB24: 400.0000 mg | ORAL_TABLET | Freq: Every day | ORAL | 0 refills | Status: DC

## 2021-10-12 DIAGNOSIS — I10 Essential (primary) hypertension: Secondary | ICD-10-CM | POA: Diagnosis not present

## 2021-10-12 DIAGNOSIS — M48061 Spinal stenosis, lumbar region without neurogenic claudication: Secondary | ICD-10-CM | POA: Diagnosis not present

## 2021-10-12 DIAGNOSIS — J45909 Unspecified asthma, uncomplicated: Secondary | ICD-10-CM | POA: Diagnosis not present

## 2021-10-12 DIAGNOSIS — M47817 Spondylosis without myelopathy or radiculopathy, lumbosacral region: Secondary | ICD-10-CM | POA: Diagnosis not present

## 2021-10-12 DIAGNOSIS — M4317 Spondylolisthesis, lumbosacral region: Secondary | ICD-10-CM | POA: Diagnosis not present

## 2021-10-12 DIAGNOSIS — M5137 Other intervertebral disc degeneration, lumbosacral region: Secondary | ICD-10-CM | POA: Diagnosis not present

## 2021-10-19 DIAGNOSIS — J45909 Unspecified asthma, uncomplicated: Secondary | ICD-10-CM | POA: Diagnosis not present

## 2021-10-19 DIAGNOSIS — M4317 Spondylolisthesis, lumbosacral region: Secondary | ICD-10-CM | POA: Diagnosis not present

## 2021-10-19 DIAGNOSIS — M48061 Spinal stenosis, lumbar region without neurogenic claudication: Secondary | ICD-10-CM | POA: Diagnosis not present

## 2021-10-19 DIAGNOSIS — M5137 Other intervertebral disc degeneration, lumbosacral region: Secondary | ICD-10-CM | POA: Diagnosis not present

## 2021-10-19 DIAGNOSIS — M47817 Spondylosis without myelopathy or radiculopathy, lumbosacral region: Secondary | ICD-10-CM | POA: Diagnosis not present

## 2021-10-19 DIAGNOSIS — I10 Essential (primary) hypertension: Secondary | ICD-10-CM | POA: Diagnosis not present

## 2021-10-26 DIAGNOSIS — I10 Essential (primary) hypertension: Secondary | ICD-10-CM | POA: Diagnosis not present

## 2021-10-26 DIAGNOSIS — J45909 Unspecified asthma, uncomplicated: Secondary | ICD-10-CM | POA: Diagnosis not present

## 2021-10-26 DIAGNOSIS — M5137 Other intervertebral disc degeneration, lumbosacral region: Secondary | ICD-10-CM | POA: Diagnosis not present

## 2021-10-26 DIAGNOSIS — M48061 Spinal stenosis, lumbar region without neurogenic claudication: Secondary | ICD-10-CM | POA: Diagnosis not present

## 2021-10-26 DIAGNOSIS — M47817 Spondylosis without myelopathy or radiculopathy, lumbosacral region: Secondary | ICD-10-CM | POA: Diagnosis not present

## 2021-10-26 DIAGNOSIS — M4317 Spondylolisthesis, lumbosacral region: Secondary | ICD-10-CM | POA: Diagnosis not present

## 2021-11-17 NOTE — Progress Notes (Unsigned)
Subjective:    Patient ID: Anna Adams, female    DOB: 06/14/1934, 87 y.o.   MRN: [id number]     HPI Anna Adams is here for follow up of her chronic medical problems, including htn, hld, hypothyroid, prediabetes, gerd, OA, R sided trigeminal pain, spinal stenosis.  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    Memory is getting worse.  Does not want evaluation - just feels it is something that is normal for aging.  Taking the tramadol for her spinal stenosis pain - taking more tramadol averages 2 a day.  Pain is a little worse.  No side effects.   Medications and allergies reviewed with patient and updated if appropriate.  Current Outpatient Medications on File Prior to Visit  Medication Sig Dispense Refill   amLODipine (NORVASC) 5 MG tablet TAKE 1 TABLET BY MOUTH EVERY DAY 90 tablet 3   atorvastatin (LIPITOR) 20 MG tablet TAKE 1 TABLET BY MOUTH EVERY DAY 90 tablet 1   b complex vitamins tablet Take 1 tablet by mouth daily.     Biotin 10000 MCG TABS See admin instructions.     cholecalciferol (VITAMIN D) 1000 UNITS tablet Take 400 Units by mouth daily.     etodolac (LODINE XL) 400 MG 24 hr tablet Take 1 tablet (400 mg total) by mouth daily. 90 tablet 0   famotidine (PEPCID) 20 MG tablet TAKE 1 TABLET BY MOUTH TWICE A DAY 180 tablet 1   hydrocortisone-pramoxine (ANALPRAM HC) 2.5-1 % rectal cream Place 1 application rectally 3 (three) times daily. 30 g 0   latanoprost (XALATAN) 0.005 % ophthalmic solution      meclizine (ANTIVERT) 25 MG tablet Take 1 tablet (25 mg total) by mouth 3 (three) times daily as needed for dizziness. 30 tablet 5   Pramoxine-HC (HYDROCORTISONE ACE-PRAMOXINE) 2.5-1 % CREA Place rectally.     promethazine (PHENERGAN) 25 MG tablet Take 1 tablet (25 mg total) by mouth every 6 (six) hours as needed for nausea or vomiting. 30 tablet 0   PROVENTIL HFA 108 (90 Base) MCG/ACT inhaler INHALE 1-2 PUFFS INTO THE LUNGS EVERY 6 (SIX) HOURS AS NEEDED FOR WHEEZING. 6.7 Inhaler 7    SYNTHROID 112 MCG tablet TAKE 1 TABLET BY MOUTH EVERY DAY 90 tablet 1   telmisartan (MICARDIS) 80 MG tablet TAKE 1 TABLET BY MOUTH EVERY DAY 90 tablet 1   UNABLE TO FIND at bedtime. Osteo-ease     vitamin E 200 UNIT capsule Take 200 Units by mouth daily.     zoledronic acid (RECLAST) 5 MG/100ML SOLN injection See admin instructions.     SYNTHROID 125 MCG tablet Take 125 mcg by mouth every morning.     No current facility-administered medications on file prior to visit.     Review of Systems  Constitutional:  Negative for chills and fever.  Respiratory:  Positive for shortness of breath (no change - chronic). Negative for cough and wheezing.   Cardiovascular:  Positive for leg swelling (chronic - no change). Negative for chest pain and palpitations.  Musculoskeletal:  Positive for back pain.  Neurological:  Negative for light-headedness and headaches.       Objective:   Vitals:   11/18/21 1302  BP: 136/80  Pulse: 63  Temp: 97.9 F (36.6 C)  SpO2: 95%   BP Readings from Last 3 Encounters:  11/18/21 136/80  05/19/21 136/80  03/24/21 (!) 156/92   Wt Readings from Last 3 Encounters:  11/18/21 141 lb (  64 kg)  05/19/21 144 lb (65.3 kg)  01/03/21 167 lb (75.8 kg)   Body mass index is 28.48 kg/m.    Physical Exam Constitutional:      General: She is not in acute distress.    Appearance: Normal appearance.  HENT:     Head: Normocephalic and atraumatic.  Eyes:     Conjunctiva/sclera: Conjunctivae normal.  Cardiovascular:     Rate and Rhythm: Normal rate and regular rhythm.     Heart sounds: Normal heart sounds. No murmur heard. Pulmonary:     Effort: Pulmonary effort is normal. No respiratory distress.     Breath sounds: Normal breath sounds. No wheezing.  Musculoskeletal:     Cervical back: Neck supple.     Right lower leg: Edema (mild - chronic) present.     Left lower leg: Edema (mild - chronic) present.  Lymphadenopathy:     Cervical: No cervical adenopathy.   Skin:    General: Skin is warm and dry.     Findings: No rash.  Neurological:     Mental Status: She is alert. Mental status is at baseline.  Psychiatric:        Mood and Affect: Mood normal.        Behavior: Behavior normal.        Lab Results  Component Value Date   WBC 7.5 05/19/2021   HGB 13.4 05/19/2021   HCT 40.8 05/19/2021   PLT 261.0 05/19/2021   GLUCOSE 112 (H) 05/19/2021   CHOL 214 (H) 05/19/2021   TRIG 71.0 05/19/2021   HDL 103.30 05/19/2021   LDLCALC 96 05/19/2021   ALT 11 05/19/2021   AST 22 05/19/2021   NA 133 (L) 05/19/2021   K 4.1 05/19/2021   CL 95 (L) 05/19/2021   CREATININE 0.91 05/19/2021   BUN 28 (H) 05/19/2021   CO2 29 05/19/2021   TSH 3.26 05/20/2020   HGBA1C 5.8 05/19/2021     Assessment & Plan:    See Problem List for Assessment and Plan of chronic medical problems.

## 2021-11-17 NOTE — Patient Instructions (Addendum)
      Blood work was ordered.   The lab is on the first floor.    Medications changes include :   none     Return in about 6 months (around 05/20/2022) for follow up.  

## 2021-11-18 ENCOUNTER — Encounter: Payer: Self-pay | Admitting: Internal Medicine

## 2021-11-18 ENCOUNTER — Ambulatory Visit (INDEPENDENT_AMBULATORY_CARE_PROVIDER_SITE_OTHER): Payer: Medicare Other | Admitting: Internal Medicine

## 2021-11-18 VITALS — BP 136/80 | HR 63 | Temp 97.9°F | Ht 59.0 in | Wt 141.0 lb

## 2021-11-18 DIAGNOSIS — R7303 Prediabetes: Secondary | ICD-10-CM | POA: Diagnosis not present

## 2021-11-18 DIAGNOSIS — K219 Gastro-esophageal reflux disease without esophagitis: Secondary | ICD-10-CM | POA: Diagnosis not present

## 2021-11-18 DIAGNOSIS — E039 Hypothyroidism, unspecified: Secondary | ICD-10-CM

## 2021-11-18 DIAGNOSIS — M48061 Spinal stenosis, lumbar region without neurogenic claudication: Secondary | ICD-10-CM

## 2021-11-18 DIAGNOSIS — G500 Trigeminal neuralgia: Secondary | ICD-10-CM

## 2021-11-18 DIAGNOSIS — E785 Hyperlipidemia, unspecified: Secondary | ICD-10-CM

## 2021-11-18 DIAGNOSIS — M150 Primary generalized (osteo)arthritis: Secondary | ICD-10-CM

## 2021-11-18 DIAGNOSIS — I10 Essential (primary) hypertension: Secondary | ICD-10-CM

## 2021-11-18 DIAGNOSIS — M159 Polyosteoarthritis, unspecified: Secondary | ICD-10-CM | POA: Diagnosis not present

## 2021-11-18 LAB — CBC WITH DIFFERENTIAL/PLATELET
Basophils Absolute: 0 10*3/uL (ref 0.0–0.1)
Basophils Relative: 0.6 % (ref 0.0–3.0)
Eosinophils Absolute: 0.1 10*3/uL (ref 0.0–0.7)
Eosinophils Relative: 1.1 % (ref 0.0–5.0)
HCT: 38.2 % (ref 36.0–46.0)
Hemoglobin: 12.5 g/dL (ref 12.0–15.0)
Lymphocytes Relative: 17.4 % (ref 12.0–46.0)
Lymphs Abs: 1.1 10*3/uL (ref 0.7–4.0)
MCHC: 32.7 g/dL (ref 30.0–36.0)
MCV: 89.5 fl (ref 78.0–100.0)
Monocytes Absolute: 0.5 10*3/uL (ref 0.1–1.0)
Monocytes Relative: 7.4 % (ref 3.0–12.0)
Neutro Abs: 4.5 10*3/uL (ref 1.4–7.7)
Neutrophils Relative %: 73.5 % (ref 43.0–77.0)
Platelets: 237 10*3/uL (ref 150.0–400.0)
RBC: 4.27 Mil/uL (ref 3.87–5.11)
RDW: 13.6 % (ref 11.5–15.5)
WBC: 6.1 10*3/uL (ref 4.0–10.5)

## 2021-11-18 LAB — LIPID PANEL
Cholesterol: 172 mg/dL (ref 0–200)
HDL: 80.2 mg/dL (ref >39.00)
LDL Cholesterol: 79 mg/dL (ref 0–99)
NonHDL: 92.04
Total CHOL/HDL Ratio: 2
Triglycerides: 64 mg/dL (ref 0.0–149.0)
VLDL: 12.8 mg/dL (ref 0.0–40.0)

## 2021-11-18 LAB — COMPREHENSIVE METABOLIC PANEL
ALT: 8 U/L (ref 0–35)
AST: 22 U/L (ref 0–37)
Albumin: 4.1 g/dL (ref 3.5–5.2)
Alkaline Phosphatase: 55 U/L (ref 39–117)
BUN: 23 mg/dL (ref 6–23)
CO2: 32 mEq/L (ref 19–32)
Calcium: 10.5 mg/dL (ref 8.4–10.5)
Chloride: 97 mEq/L (ref 96–112)
Creatinine, Ser: 0.82 mg/dL (ref 0.40–1.20)
GFR: 65.07 mL/min (ref >60.00)
Glucose, Bld: 91 mg/dL (ref 70–99)
Potassium: 4.6 mEq/L (ref 3.5–5.1)
Sodium: 132 mEq/L — ABNORMAL LOW (ref 135–145)
Total Bilirubin: 0.6 mg/dL (ref 0.2–1.2)
Total Protein: 6.4 g/dL (ref 6.0–8.3)

## 2021-11-18 LAB — HEMOGLOBIN A1C: Hgb A1c MFr Bld: 5.7 % (ref 4.6–6.5)

## 2021-11-18 MED ORDER — TRAMADOL HCL 50 MG PO TABS: ORAL_TABLET | ORAL | 0 refills | Status: DC

## 2021-11-18 NOTE — Assessment & Plan Note (Signed)
Chronic °Regular exercise and healthy diet encouraged °Check lipid panel  °Continue atorvastatin 20 mg daily °

## 2021-11-18 NOTE — Assessment & Plan Note (Signed)
Chronic GERD controlled Continue pepcid 20 mg daily

## 2021-11-18 NOTE — Assessment & Plan Note (Signed)
Chronic Continue etodolac 400 mg daily

## 2021-11-18 NOTE — Assessment & Plan Note (Signed)
Chronic Check a1c Low sugar / carb diet Stressed regular exercise  

## 2021-11-18 NOTE — Assessment & Plan Note (Signed)
Chronic BP well controlled Continue amlodipine 5 mg daily, telmisartan 80 mg daily cmp

## 2021-11-18 NOTE — Assessment & Plan Note (Signed)
Chronic Not currently taking any medication for it Symptoms mild and manageable

## 2021-11-18 NOTE — Assessment & Plan Note (Signed)
Chronic Pain is severe and does not limit her Pain manageable with tramadol-currently taking tramadol 50 mg twice a day on average Not having any side effects She is taking the medication appropriately Continue tramadol 50 mg every 8 hours as needed-refilled today

## 2021-11-18 NOTE — Assessment & Plan Note (Signed)
Chronic Management per endocrine 

## 2021-11-23 ENCOUNTER — Other Ambulatory Visit: Payer: Self-pay | Admitting: Internal Medicine

## 2021-12-01 DIAGNOSIS — Z23 Encounter for immunization: Secondary | ICD-10-CM | POA: Diagnosis not present

## 2021-12-07 ENCOUNTER — Other Ambulatory Visit: Payer: Self-pay | Admitting: Internal Medicine

## 2021-12-08 ENCOUNTER — Other Ambulatory Visit: Payer: Self-pay | Admitting: Internal Medicine

## 2022-01-07 ENCOUNTER — Other Ambulatory Visit: Payer: Self-pay | Admitting: Internal Medicine

## 2022-01-23 ENCOUNTER — Other Ambulatory Visit: Payer: Self-pay | Admitting: Internal Medicine

## 2022-02-24 ENCOUNTER — Other Ambulatory Visit: Payer: Self-pay | Admitting: Internal Medicine

## 2022-03-17 ENCOUNTER — Ambulatory Visit: Payer: Medicare Other

## 2022-03-17 ENCOUNTER — Telehealth: Payer: Self-pay

## 2022-03-17 NOTE — Telephone Encounter (Signed)
Called patient lvm to return call, to complete AWV at [phone].  If no return call within 15 minutes, patient may reschedule for the next available appointment with NHA or CMA. -S. Anwar Crill,LPN 

## 2022-03-17 NOTE — Telephone Encounter (Signed)
Pt called and stated she has nt gotten a call to do her AWV and she was waiting on the call. I told her I would let you know. The number she was to be Contacted at was [phone]

## 2022-03-31 ENCOUNTER — Telehealth: Payer: Self-pay

## 2022-03-31 NOTE — Telephone Encounter (Signed)
Called patient lvm to return call, to complete AWV at [phone] or [phone].  If no return call within 10-15 minutes, patient may reschedule for the next available appointment with NHA or CMA.  Ravina Milner N. Shy Guallpa, LPN. [place] Team Direct Dial: [phone]

## 2022-03-31 NOTE — Telephone Encounter (Signed)
Please schedule patient to see NHA on 05/24/2022 after seeing pcp at 1:00p if she calls back.

## 2022-05-21 ENCOUNTER — Telehealth: Payer: Self-pay

## 2022-05-21 ENCOUNTER — Other Ambulatory Visit: Payer: Self-pay | Admitting: Internal Medicine

## 2022-05-21 NOTE — Telephone Encounter (Signed)
Electronic refill request sent to Dr. Lawerance Bach this morning.

## 2022-05-21 NOTE — Telephone Encounter (Signed)
LOV: 11/18/2021.  Pt is calling asking for a med refill on her   etodolac (LODINE XL) 400 MG 24 hr tablet   as she states she was told by the pharmacy she has to reach out to her doctors office for a refill.

## 2022-05-23 NOTE — Patient Instructions (Addendum)
      Blood work was ordered.   The lab is on the first floor.    Medications changes include :   lasix 20 mg daily    A referral was ordered for [facility] social work.     Someone will call you to schedule an appointment.    Return in about 6 months (around 11/23/2022) for follow up.

## 2022-05-23 NOTE — Progress Notes (Signed)
Subjective:    Patient ID: Anna Adams, female    DOB: 1934/11/24, 88 y.o.   MRN: [id number]     HPI Yani is here for follow up of her chronic medical problems.  Takes tramadol as needed.  Can go up to 3 days w/o it.    B/l leg swelling - started about several weeks.  No change in medications.  Sits with her legs up.     Medications and allergies reviewed with patient and updated if appropriate.  Current Outpatient Medications on File Prior to Visit  Medication Sig Dispense Refill   amLODipine (NORVASC) 5 MG tablet TAKE 1 TABLET BY MOUTH EVERY DAY 90 tablet 3   atorvastatin (LIPITOR) 20 MG tablet TAKE 1 TABLET BY MOUTH EVERY DAY 90 tablet 1   b complex vitamins tablet Take 1 tablet by mouth daily.     Biotin 40981 MCG TABS See admin instructions.     cholecalciferol (VITAMIN D) 1000 UNITS tablet Take 400 Units by mouth daily.     etodolac (LODINE XL) 400 MG 24 hr tablet TAKE 1 TABLET BY MOUTH EVERY DAY 90 tablet 0   famotidine (PEPCID) 20 MG tablet TAKE 1 TABLET BY MOUTH TWICE A DAY 180 tablet 1   hydrocortisone-pramoxine (ANALPRAM HC) 2.5-1 % rectal cream Place 1 application rectally 3 (three) times daily. 30 g 0   latanoprost (XALATAN) 0.005 % ophthalmic solution      meclizine (ANTIVERT) 25 MG tablet Take 1 tablet (25 mg total) by mouth 3 (three) times daily as needed for dizziness. 30 tablet 5   Pramoxine-HC (HYDROCORTISONE ACE-PRAMOXINE) 2.5-1 % CREA Place rectally.     promethazine (PHENERGAN) 25 MG tablet Take 1 tablet (25 mg total) by mouth every 6 (six) hours as needed for nausea or vomiting. 30 tablet 0   PROVENTIL HFA 108 (90 Base) MCG/ACT inhaler INHALE 1-2 PUFFS INTO THE LUNGS EVERY 6 (SIX) HOURS AS NEEDED FOR WHEEZING. 6.7 Inhaler 7   SYNTHROID 125 MCG tablet Take 125 mcg by mouth every morning.     telmisartan (MICARDIS) 80 MG tablet TAKE 1 TABLET BY MOUTH EVERY DAY 90 tablet 1   traMADol (ULTRAM) 50 MG tablet TAKE 1 TABLET BY MOUTH EVERY 8 HOURS AS NEEDED  FOR SEVERE PAIN (CHRONIC BACK PAIN). 90 tablet 0   UNABLE TO FIND at bedtime. Osteo-ease     vitamin E 200 UNIT capsule Take 200 Units by mouth daily.     zoledronic acid (RECLAST) 5 MG/100ML SOLN injection See admin instructions.     No current facility-administered medications on file prior to visit.     Review of Systems  Constitutional:  Negative for fever.  Respiratory:  Positive for shortness of breath (chronic - stable). Negative for cough and wheezing.   Cardiovascular:  Positive for leg swelling. Negative for chest pain and palpitations.  Gastrointestinal:  Negative for constipation.       Gerd controlled  Musculoskeletal:  Positive for back pain.  Neurological:  Negative for light-headedness and numbness.       Objective:   Vitals:   05/24/22 1303  BP: (!) 144/80  Pulse: 73  Temp: 98.1 F (36.7 C)  SpO2: 95%   BP Readings from Last 3 Encounters:  05/24/22 (!) 144/80  11/18/21 136/80  05/19/21 136/80   Wt Readings from Last 3 Encounters:  05/24/22 142 lb (64.4 kg)  11/18/21 141 lb (64 kg)  05/19/21 144 lb (65.3 kg)   Body mass  index is 28.68 kg/m.    Physical Exam Constitutional:      General: She is not in acute distress.    Appearance: Normal appearance.  HENT:     Head: Normocephalic and atraumatic.  Eyes:     Conjunctiva/sclera: Conjunctivae normal.  Cardiovascular:     Rate and Rhythm: Normal rate and regular rhythm.     Heart sounds: Normal heart sounds.  Pulmonary:     Effort: Pulmonary effort is normal. No respiratory distress.     Breath sounds: Normal breath sounds. No wheezing.  Musculoskeletal:     Cervical back: Neck supple.     Right lower leg: Edema (1+1/3 Beckel up leg) present.     Left lower leg: Edema (1+1/3 Ander up leg) present.  Lymphadenopathy:     Cervical: No cervical adenopathy.  Skin:    General: Skin is warm and dry.     Findings: No rash.  Neurological:     Mental Status: She is alert. Mental status is at baseline.   Psychiatric:        Mood and Affect: Mood normal.        Behavior: Behavior normal.        Lab Results  Component Value Date   WBC 6.1 11/18/2021   HGB 12.5 11/18/2021   HCT 38.2 11/18/2021   PLT 237.0 11/18/2021   GLUCOSE 91 11/18/2021   CHOL 172 11/18/2021   TRIG 64.0 11/18/2021   HDL 80.20 11/18/2021   LDLCALC 79 11/18/2021   ALT 8 11/18/2021   AST 22 11/18/2021   NA 132 (L) 11/18/2021   K 4.6 11/18/2021   CL 97 11/18/2021   CREATININE 0.82 11/18/2021   BUN 23 11/18/2021   CO2 32 11/18/2021   TSH 3.26 05/20/2020   HGBA1C 5.7 11/18/2021     Assessment & Plan:    See Problem List for Assessment and Plan of chronic medical problems.

## 2022-05-24 ENCOUNTER — Ambulatory Visit (INDEPENDENT_AMBULATORY_CARE_PROVIDER_SITE_OTHER): Payer: Medicare Other

## 2022-05-24 ENCOUNTER — Telehealth: Payer: Self-pay | Admitting: *Deleted

## 2022-05-24 ENCOUNTER — Ambulatory Visit (INDEPENDENT_AMBULATORY_CARE_PROVIDER_SITE_OTHER): Payer: Medicare Other | Admitting: Internal Medicine

## 2022-05-24 ENCOUNTER — Encounter: Payer: Self-pay | Admitting: Internal Medicine

## 2022-05-24 VITALS — BP 132/72 | HR 73 | Temp 98.1°F | Ht 59.0 in | Wt 142.0 lb

## 2022-05-24 DIAGNOSIS — M48061 Spinal stenosis, lumbar region without neurogenic claudication: Secondary | ICD-10-CM

## 2022-05-24 DIAGNOSIS — G500 Trigeminal neuralgia: Secondary | ICD-10-CM

## 2022-05-24 DIAGNOSIS — K219 Gastro-esophageal reflux disease without esophagitis: Secondary | ICD-10-CM | POA: Diagnosis not present

## 2022-05-24 DIAGNOSIS — R600 Localized edema: Secondary | ICD-10-CM

## 2022-05-24 DIAGNOSIS — M159 Polyosteoarthritis, unspecified: Secondary | ICD-10-CM

## 2022-05-24 DIAGNOSIS — E785 Hyperlipidemia, unspecified: Secondary | ICD-10-CM | POA: Diagnosis not present

## 2022-05-24 DIAGNOSIS — I10 Essential (primary) hypertension: Secondary | ICD-10-CM | POA: Diagnosis not present

## 2022-05-24 DIAGNOSIS — Z0000 Encounter for general adult medical examination without abnormal findings: Secondary | ICD-10-CM | POA: Diagnosis not present

## 2022-05-24 DIAGNOSIS — R7303 Prediabetes: Secondary | ICD-10-CM | POA: Diagnosis not present

## 2022-05-24 MED ORDER — FUROSEMIDE 20 MG PO TABS
20.0000 mg | ORAL_TABLET | Freq: Every day | ORAL | 3 refills | Status: DC
Start: 2022-05-24 — End: 2022-06-14

## 2022-05-24 NOTE — Assessment & Plan Note (Signed)
Chronic GERD controlled Continue pepcid 20 mg 1-2 daily

## 2022-05-24 NOTE — Patient Instructions (Signed)
Anna Adams , Thank you for taking time to come for your Medicare Wellness Visit. I appreciate your ongoing commitment to your health goals. Please review the following plan we discussed and let me know if I can assist you in the future.   These are the goals we discussed:  Goals      Stay as active as I can for as long as I can.        This is a list of the screening recommended for you and due dates:  Health Maintenance  Topic Date Due   Zoster (Shingles) Vaccine (1 of 2) Never done   COVID-19 Vaccine (4 - 2023-24 season) 09/25/2021   Flu Shot  08/26/2022   DEXA scan (bone density measurement)  10/02/2022   Medicare Annual Wellness Visit  05/24/2023   DTaP/Tdap/Td vaccine (3 - Td or Tdap) 09/30/2026   Pneumonia Vaccine  Completed   HPV Vaccine  Aged Out    Advanced directives: Yes  Conditions/risks identified: Yes  Next appointment: Follow up in one year for your annual wellness visit.   Preventive Care 31 Years and Older, Female Preventive care refers to lifestyle choices and visits with your health care provider that can promote health and wellness. What does preventive care include? A yearly physical exam. This is also called an annual well check. Dental exams once or twice a year. Routine eye exams. Ask your health care provider how often you should have your eyes checked. Personal lifestyle choices, including: Daily care of your teeth and gums. Regular physical activity. Eating a healthy diet. Avoiding tobacco and drug use. Limiting alcohol use. Practicing safe sex. Taking low-dose aspirin every day. Taking vitamin and mineral supplements as recommended by your health care provider. What happens during an annual well check? The services and screenings done by your health care provider during your annual well check will depend on your age, overall health, lifestyle risk factors, and family history of disease. Counseling  Your health care provider may ask you  questions about your: Alcohol use. Tobacco use. Drug use. Emotional well-being. Home and relationship well-being. Sexual activity. Eating habits. History of falls. Memory and ability to understand (cognition). Work and work Astronomer. Reproductive health. Screening  You may have the following tests or measurements: Height, weight, and BMI. Blood pressure. Lipid and cholesterol levels. These may be checked every 5 years, or more frequently if you are over 85 years old. Skin check. Lung cancer screening. You may have this screening every year starting at age 52 if you have a 30-pack-year history of smoking and currently smoke or have quit within the past 15 years. Fecal occult blood test (FOBT) of the stool. You may have this test every year starting at age 24. Flexible sigmoidoscopy or colonoscopy. You may have a sigmoidoscopy every 5 years or a colonoscopy every 10 years starting at age 80. Hepatitis C blood test. Hepatitis B blood test. Sexually transmitted disease (STD) testing. Diabetes screening. This is done by checking your blood sugar (glucose) after you have not eaten for a while (fasting). You may have this done every 1-3 years. Bone density scan. This is done to screen for osteoporosis. You may have this done starting at age 52. Mammogram. This may be done every 1-2 years. Talk to your health care provider about how often you should have regular mammograms. Talk with your health care provider about your test results, treatment options, and if necessary, the need for more tests. Vaccines  Your health care  provider may recommend certain vaccines, such as: Influenza vaccine. This is recommended every year. Tetanus, diphtheria, and acellular pertussis (Tdap, Td) vaccine. You may need a Td booster every 10 years. Zoster vaccine. You may need this after age 68. Pneumococcal 13-valent conjugate (PCV13) vaccine. One dose is recommended after age 60. Pneumococcal polysaccharide  (PPSV23) vaccine. One dose is recommended after age 52. Talk to your health care provider about which screenings and vaccines you need and how often you need them. This information is not intended to replace advice given to you by your health care provider. Make sure you discuss any questions you have with your health care provider. Document Released: 02/07/2015 Document Revised: 10/01/2015 Document Reviewed: 11/12/2014 Elsevier Interactive Patient Education  2017 [place] Prevention in the Home Falls can cause injuries. They can happen to people of all ages. There are many things you can do to make your home safe and to help prevent falls. What can I do on the outside of my home? Regularly fix the edges of walkways and driveways and fix any cracks. Remove anything that might make you trip as you walk through a door, such as a raised step or threshold. Trim any bushes or trees on the path to your home. Use bright outdoor lighting. Clear any walking paths of anything that might make someone trip, such as rocks or tools. Regularly check to see if handrails are loose or broken. Make sure that both sides of any steps have handrails. Any raised decks and porches should have guardrails on the edges. Have any leaves, snow, or ice cleared regularly. Use sand or salt on walking paths during winter. Clean up any spills in your garage right away. This includes oil or grease spills. What can I do in the bathroom? Use night lights. Install grab bars by the toilet and in the tub and shower. Do not use towel bars as grab bars. Use non-skid mats or decals in the tub or shower. If you need to sit down in the shower, use a plastic, non-slip stool. Keep the floor dry. Clean up any water that spills on the floor as soon as it happens. Remove soap buildup in the tub or shower regularly. Attach bath mats securely with double-sided non-slip rug tape. Do not have throw rugs and other things on the  floor that can make you trip. What can I do in the bedroom? Use night lights. Make sure that you have a light by your bed that is easy to reach. Do not use any sheets or blankets that are too big for your bed. They should not hang down onto the floor. Have a firm chair that has side arms. You can use this for support while you get dressed. Do not have throw rugs and other things on the floor that can make you trip. What can I do in the kitchen? Clean up any spills right away. Avoid walking on wet floors. Keep items that you use a lot in easy-to-reach places. If you need to reach something above you, use a strong step stool that has a grab bar. Keep electrical cords out of the [place]. Do not use floor polish or wax that makes floors slippery. If you must use wax, use non-skid floor wax. Do not have throw rugs and other things on the floor that can make you trip. What can I do with my stairs? Do not leave any items on the stairs. Make sure that there are handrails on both  sides of the stairs and use them. Fix handrails that are broken or loose. Make sure that handrails are as long as the stairways. Check any carpeting to make sure that it is firmly attached to the stairs. Fix any carpet that is loose or worn. Avoid having throw rugs at the top or bottom of the stairs. If you do have throw rugs, attach them to the floor with carpet tape. Make sure that you have a light switch at the top of the stairs and the bottom of the stairs. If you do not have them, ask someone to add them for you. What else can I do to help prevent falls? Wear shoes that: Do not have high heels. Have rubber bottoms. Are comfortable and fit you well. Are closed at the toe. Do not wear sandals. If you use a stepladder: Make sure that it is fully opened. Do not climb a closed stepladder. Make sure that both sides of the stepladder are locked into place. Ask someone to hold it for you, if possible. Clearly mark and make  sure that you can see: Any grab bars or handrails. First and last steps. Where the edge of each step is. Use tools that help you move around (mobility aids) if they are needed. These include: Canes. Walkers. Scooters. Crutches. Turn on the lights when you go into a dark area. Replace any light bulbs as soon as they burn out. Set up your furniture so you have a clear path. Avoid moving your furniture around. If any of your floors are uneven, fix them. If there are any pets around you, be aware of where they are. Review your medicines with your doctor. Some medicines can make you feel dizzy. This can increase your chance of falling. Ask your doctor what other things that you can do to help prevent falls. This information is not intended to replace advice given to you by your health care provider. Make sure you discuss any questions you have with your health care provider. Document Released: 11/07/2008 Document Revised: 06/19/2015 Document Reviewed: 02/15/2014 Elsevier Interactive Patient Education  2017 [employer].

## 2022-05-24 NOTE — Assessment & Plan Note (Signed)
Chronic Check a1c Low sugar / carb diet Stressed regular exercise  

## 2022-05-24 NOTE — Assessment & Plan Note (Signed)
Chronic Continue etodolac 400 mg daily 

## 2022-05-24 NOTE — Assessment & Plan Note (Signed)
Acute on chronic Has chronic leg edema that has been worse over the past several weeks No shortness of breath although she is sedentary She has been elevating her legs when she sits Very sedentary so not moving much-encouraged her to get up and move around more No recent changes in diet Possible worsening of venous insufficiency CMP, BNP At 1 point she was taking furosemide 20 mg daily, but is no longer on her list-she thinks she may be taking it-restart furosemide 20 mg daily-she will let me know if she is actually taking it and then we may need to do several days of 40 mg and then go back to 20 mg, but there is a good chance she is not taking it and that could be partially the reason for the increased edema

## 2022-05-24 NOTE — Assessment & Plan Note (Signed)
Chronic BP well controlled Continue amlodipine 5 mg daily, telmisartan 80 mg daily cmp  

## 2022-05-24 NOTE — Progress Notes (Unsigned)
  Care Coordination  Outreach Note  05/24/2022 Name: Anna Adams MRN: [id number] DOB: 08-24-1934   Care Coordination Outreach Attempts: An unsuccessful telephone outreach was attempted today to offer the patient information about available care coordination services as a benefit of their health plan.   Follow Up Plan:  Additional outreach attempts will be made to offer the patient care coordination information and services.   Encounter Outcome:  Pt. Request to Call Back  Burman Nieves, [facility] Care Coordination Care Guide Direct Dial: [phone]

## 2022-05-24 NOTE — Assessment & Plan Note (Signed)
Chronic °Regular exercise and healthy diet encouraged °Check lipid panel  °Continue atorvastatin 20 mg daily °

## 2022-05-24 NOTE — Assessment & Plan Note (Signed)
Chronic Pain is severe and does limit her-she is very sedentary Pain manageable with tramadol-currently taking tramadol 50 mg twice a day on average, but can go 3 days without medication Not having any side effects-denies constipation She is taking the medication appropriately Continue tramadol 50 mg every 8 hours as needed

## 2022-05-24 NOTE — Progress Notes (Addendum)
Subjective:   Anna Adams is a 88 y.o. female who presents for Medicare Annual (Subsequent) preventive examination.  Review of Systems     Cardiac Risk Factors include: advanced age (>75men, >67 women);dyslipidemia;family history of premature cardiovascular disease;hypertension;sedentary lifestyle     Objective:    Today's Vitals   05/24/22 1429  BP: 132/72  Pulse: 73  Temp: 98.1 F (36.7 C)  TempSrc: Oral  SpO2: 95%  Weight: 142 lb (64.4 kg)  Height: 4\' 11"  (1.499 m)  PainSc: 2    Body mass index is 28.68 kg/m.     05/24/2022    2:58 PM 03/16/2021    2:28 PM 09/22/2016    1:57 PM 07/17/2015    3:38 PM  Advanced Directives  Does Patient Have a Medical Advance Directive? Yes Yes No No  Type of Estate agent of [place];Living will Living will;Healthcare Power of Attorney    Does patient want to make changes to medical advance directive?  No - Patient declined    Copy of Healthcare Power of Attorney in Chart? No - copy requested No - copy requested    Would patient like information on creating a medical advance directive?   Yes (ED - Information included in AVS) No - patient declined information    Current Medications (verified) Outpatient Encounter Medications as of 05/24/2022  Medication Sig   amLODipine (NORVASC) 5 MG tablet TAKE 1 TABLET BY MOUTH EVERY DAY   atorvastatin (LIPITOR) 20 MG tablet TAKE 1 TABLET BY MOUTH EVERY DAY   b complex vitamins tablet Take 1 tablet by mouth daily.   Biotin 82956 MCG TABS See admin instructions.   cholecalciferol (VITAMIN D) 1000 UNITS tablet Take 400 Units by mouth daily.   etodolac (LODINE XL) 400 MG 24 hr tablet TAKE 1 TABLET BY MOUTH EVERY DAY   famotidine (PEPCID) 20 MG tablet TAKE 1 TABLET BY MOUTH TWICE A DAY   furosemide (LASIX) 20 MG tablet Take 1 tablet (20 mg total) by mouth daily.   hydrocortisone-pramoxine (ANALPRAM HC) 2.5-1 % rectal cream Place 1 application rectally 3 (three) times daily.    latanoprost (XALATAN) 0.005 % ophthalmic solution    Pramoxine-HC (HYDROCORTISONE ACE-PRAMOXINE) 2.5-1 % CREA Place rectally.   PROVENTIL HFA 108 (90 Base) MCG/ACT inhaler INHALE 1-2 PUFFS INTO THE LUNGS EVERY 6 (SIX) HOURS AS NEEDED FOR WHEEZING.   SYNTHROID 125 MCG tablet Take 125 mcg by mouth every morning.   telmisartan (MICARDIS) 80 MG tablet TAKE 1 TABLET BY MOUTH EVERY DAY   traMADol (ULTRAM) 50 MG tablet TAKE 1 TABLET BY MOUTH EVERY 8 HOURS AS NEEDED FOR SEVERE PAIN (CHRONIC BACK PAIN).   UNABLE TO FIND at bedtime. Osteo-ease   vitamin E 200 UNIT capsule Take 200 Units by mouth daily.   zoledronic acid (RECLAST) 5 MG/100ML SOLN injection See admin instructions.   No facility-administered encounter medications on file as of 05/24/2022.    Allergies (verified) Contrast media [iodinated contrast media], Dyphylline-guaifenesin, Erythromycin, Gabapentin, Mevacor [lovastatin], and Other   History: Past Medical History:  Diagnosis Date   ALLERGIC RHINITIS    Anemia    Asthma    Asthma    Cataract    GERD (gastroesophageal reflux disease)    with HH   History of renal calculi    Hypertension    Hypothyroidism    Osteoarthritis    Parotid tumor    L side, s/p resection 1980 and 2007   Past Surgical History:  Procedure Laterality  Date   CARDIOVASCULAR STRESS TEST  07/12/2005   EF 85%   CERVICAL DISCECTOMY  1993   GANGLION CYST EXCISION  1954 & 1963   (L) wrist x's 2   KNEE ARTHROPLASTY  04/22/2006   DR. Haze Boyden CAFFREY   Left knee replacement  2005   LUMBAR DISC SURGERY     PAROTID GLAND TUMOR EXCISION  1980, 2007   Left   right knee replacement  2008   TONSILLECTOMY AND ADENOIDECTOMY     TUBAL LIGATION     Family History  Problem Relation Age of Onset   Asthma Mother    Cancer Mother    Heart disease Mother    Hypertension Father    Heart disease Father    Hyperlipidemia Father    Stroke Father    Mental illness Father    Stroke Maternal Grandmother    Heart  disease Maternal Grandfather    Hypertension Paternal Grandfather    Social History   Socioeconomic History   Marital status: Widowed    Spouse name: Not on file   Number of children: 2   Years of education: Not on file   Highest education level: Associate degree: academic program  Occupational History   Not on file  Tobacco Use   Smoking status: Former    Types: Cigarettes    Quit date: 09/01/1962    Years since quitting: 59.7   Smokeless tobacco: Never  Substance and Sexual Activity   Alcohol use: No    Alcohol/week: 0.0 standard drinks of alcohol   Drug use: No   Sexual activity: Not on file  Other Topics Concern   Not on file  Social History Narrative   Recently widowed, 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, retired Charity fundraiser   Right handed   Caffeine: up to 4-5 cups/day (coffee and tea)   Social Determinants of Health   Financial Resource Strain: Low Risk  (05/24/2022)   Overall Financial Resource Strain (CARDIA)    Difficulty of Paying Living Expenses: Not hard at all  Food Insecurity: No Food Insecurity (05/24/2022)   Hunger Vital Sign    Worried About Running Out of Food in the Last Year: Never true    Ran Out of Food in the Last Year: Never true  Transportation Needs: No Transportation Needs (05/24/2022)   PRAPARE - Administrator, Civil Service (Medical): No    Lack of Transportation (Non-Medical): No  Physical Activity: Inactive (05/24/2022)   Exercise Vital Sign    Days of Exercise per Week: 0 days    Minutes of Exercise per Session: 0 min  Stress: No Stress Concern Present (05/24/2022)   [employer] of Occupational Health - Occupational Stress Questionnaire    Feeling of Stress : Not at all  Social Connections: Moderately Integrated (05/24/2022)   Social Connection and Isolation Panel [NHANES]    Frequency of Communication with Friends and Family: More than three times a week    Frequency of Social Gatherings with Friends and Family: More than three times a  week    Attends Religious Services: More than 4 times per year    Active Member of Clubs or Organizations: Patient unable to answer    Attends Banker Meetings: More than 4 times per year    Marital Status: Widowed    Tobacco Counseling Counseling given: Not Answered   Clinical Intake:  Pre-visit preparation completed: Yes  Pain : 0-10 Pain Score: 2  Pain Type: Chronic pain Pain  Location: Back     BMI - recorded: 28.68 Nutritional Status: BMI 25 -29 Overweight Nutritional Risks: None Diabetes: No  How often do you need to have someone help you when you read instructions, pamphlets, or other written materials from your doctor or pharmacy?: 1 - Never 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 No  Interpreter Needed?: No  Information entered by :: Susie Cassette, LPN.   Activities of Daily Living    05/24/2022    3:00 PM  In your present state of health, do you have any difficulty performing the following activities:  Hearing? 1  Vision? 0  Difficulty concentrating or making decisions? 1  Walking or climbing stairs? 1  Dressing or bathing? 0  Doing errands, shopping? 1  Preparing Food and eating ? N  Using the Toilet? N  In the past six months, have you accidently leaked urine? Y  Managing your Medications? N  Managing your Finances? N  Housekeeping or managing your Housekeeping? N    Patient Care Team: Pincus Sanes, MD as PCP - General (Internal Medicine) [name], MD (Cardiology) Mckinley Jewel, MD (Ophthalmology) Charlett Nose, DPM (Inactive) (Podiatry) Frederico Hamman, MD (Orthopedic Surgery) Clance, Maree Krabbe, MD (Pulmonary Disease) Carman Ching, MD (Inactive) (Gastroenterology) Kerrin Champagne, MD (Inactive) as Consulting Physician (Orthopedic Surgery) Ellin Saba, [facility] (Inactive) as Pharmacist (Pharmacist) Maris Berger, MD as Consulting Physician (Ophthalmology)  Indicate  any recent Medical Services you may have received from other than [facility] providers in the past year (date may be approximate).     Assessment:   This is a routine wellness examination for Anna Adams.  Hearing/Vision screen Hearing Screening - Comments:: Patient has hearing difficulties and wears hearing aids.  Vision Screening - Comments:: Wears rx glasses - up to date with routine eye exams with  Maris Berger, MD.  Dietary issues and exercise activities discussed: Current Exercise Habits: The patient does not participate in regular exercise at present, Exercise limited by: orthopedic condition(s)   Goals Addressed             This Visit's Progress    Stay as active as I can for as long as I can.        Depression Screen    05/24/2022    2:56 PM 03/16/2021    2:34 PM 10/16/2018    1:57 PM 09/22/2016    1:58 PM 09/22/2015    1:30 PM 05/02/2015   12:58 PM 04/29/2015   11:33 AM  PHQ 2/9 Scores  PHQ - 2 Score 0 0 0 0 0 0 0  PHQ- 9 Score    0       Fall Risk    05/24/2022    3:01 PM 05/24/2022    3:00 PM 03/16/2021    2:30 PM 05/20/2020    1:10 PM 04/20/2019    1:33 PM  Fall Risk   Falls in the past year? 1 1 1 1 1   Number falls in past yr: 0 0 0 0 1  Comment fell in the bathroom      Injury with Fall? 0 0 0 1 0  Risk for fall due to : History of fall(s);Impaired balance/gait;Orthopedic patient No Fall Risks Impaired balance/gait No Fall Risks   Follow up  Falls prevention discussed Falls evaluation completed Falls evaluation completed     FALL RISK PREVENTION PERTAINING TO THE HOME:  Any stairs in or around the home? No  If  so, are there any without handrails? No  Home free of loose throw rugs in walkways, pet beds, electrical cords, etc? Yes  Adequate lighting in your home to reduce risk of falls? Yes   ASSISTIVE DEVICES UTILIZED TO PREVENT FALLS:  Life alert? No  Use of a cane, walker or w/c? Yes  Grab bars in the bathroom? Yes  Shower chair or bench in shower? Yes   Elevated toilet seat or a handicapped toilet? Yes   TIMED UP AND GO:  Was the test performed? Yes .  Length of time to ambulate 10 feet: 12 sec.   Gait slow and steady with assistive device  Cognitive Function:    09/22/2016    4:47 PM  MMSE - Mini Mental State Exam  Orientation to time 5  Orientation to Place 5  Registration 3  Attention/ Calculation 4  Recall 3  Language- name 2 objects 2  Language- repeat 1  Language- follow 3 step command 3  Language- read & follow direction 1  Write a sentence 1  Copy design 1  Total score 29        05/24/2022    3:03 PM  6CIT Screen  What Year? 0 points  What month? 0 points  What time? 0 points  Count back from 20 0 points  Months in reverse 0 points  Repeat phrase 0 points  Total Score 0 points    Immunizations Immunization History  Administered Date(s) Administered   Fluad Quad(high Dose 65+) 10/16/2018, 10/23/2019, 12/01/2021   Hepatitis B, ADULT 08/01/2018   Influenza Whole 11/08/2013   Influenza, High Dose Seasonal PF 10/12/2017, 10/27/2020   Influenza,inj,Quad PF,6+ Mos 10/25/2012   Influenza-Unspecified 10/26/2011, 11/04/2014, 11/04/2015, 10/25/2016   PFIZER Comirnaty(Gray Top)Covid-19 Tri-Sucrose Vaccine 06/03/2020   PFIZER(Purple Top)SARS-COV-2 Vaccination 03/22/2019, 04/17/2019   Pneumococcal Conjugate-13 11/26/2014   Pneumococcal Polysaccharide-23 01/25/2005, 08/01/2018   Td 01/25/2006   Tdap 09/29/2016   Zoster, Live 01/25/2009    TDAP status: Up to date  Flu Vaccine status: Up to date  Pneumococcal vaccine status: Up to date  Covid-19 vaccine status: Completed vaccines  Qualifies for Shingles Vaccine? Yes   Zostavax completed Yes   Shingrix Completed?: No.    Education has been provided regarding the importance of this vaccine. Patient has been advised to call insurance company to determine out of pocket expense if they have not yet received this vaccine. Advised may also receive vaccine at  local pharmacy or Health Dept. Verbalized acceptance and understanding.  Screening Tests Health Maintenance  Topic Date Due   Zoster Vaccines- Shingrix (1 of 2) Never done   COVID-19 Vaccine (4 - 2023-24 season) 09/25/2021   INFLUENZA VACCINE  08/26/2022   DEXA SCAN  10/02/2022   Medicare Annual Wellness (AWV)  05/24/2023   DTaP/Tdap/Td (3 - Td or Tdap) 09/30/2026   Pneumonia Vaccine 65+ Years old  Completed   HPV VACCINES  Aged Out    Health Maintenance  Health Maintenance Due  Topic Date Due   Zoster Vaccines- Shingrix (1 of 2) Never done   COVID-19 Vaccine (4 - 2023-24 season) 09/25/2021    Colorectal cancer screening: No longer required.   Mammogram status: Completed 07/13/2018. Repeat every year  Bone Density status: Completed 10/01/2020. Results reflect: Bone density results: OSTEOPOROSIS. Repeat every 2 years.  Lung Cancer Screening: (Low Dose CT Chest recommended if Age 23-80 years, 30 pack-year currently smoking OR have quit w/in 15years.) does not qualify.   Lung Cancer Screening Referral: no  Additional Screening:  Hepatitis C Screening: does not qualify; Completed: No  Vision Screening: Recommended annual ophthalmology exams for early detection of glaucoma and other disorders of the eye. Is the patient up to date with their annual eye exam?  Yes  Who is the provider or what is the name of the office in which the patient attends annual eye exams? Maris Berger, MD. If pt is not established with a provider, would they like to be referred to a provider to establish care? No .   Dental Screening: Recommended annual dental exams for proper oral hygiene  Community Resource Referral / Chronic Care Management: CRR required this visit?  No   CCM required this visit?  No      Plan:     I have personally reviewed and noted the following in the patient's chart:   Medical and social history Use of alcohol, tobacco or illicit drugs  Current medications and  supplements including opioid prescriptions. Patient is not currently taking opioid prescriptions. Functional ability and status Nutritional status Physical activity Advanced directives List of other physicians Hospitalizations, surgeries, and ER visits in previous 12 months Vitals Screenings to include cognitive, depression, and falls Referrals and appointments  In addition, I have reviewed and discussed with patient certain preventive protocols, quality metrics, and best practice recommendations. A written personalized care plan for preventive services as well as general preventive health recommendations were provided to patient.     Mickeal Needy, LPN   1/32/4401   Nurse Notes:  Normal cognitive status assessed by direct observation by this Nurse Health Advisor. No abnormalities found.

## 2022-05-26 NOTE — Progress Notes (Signed)
Patient was seen in the office for her Annual Wellness Visit with this Nurse Health Advisor on 05/24/2022.  Alveda Vanhorne N. Winifred Balogh, LPN. [facility] AWV Team Direct Dial: [phone]

## 2022-05-26 NOTE — Progress Notes (Signed)
  Care Coordination   Note   05/26/2022 Name: HOANG REICH MRN: [id number] DOB: 01/21/1935  Ayomide Zuleta Schulenburg is a 88 y.o. year old female who sees Burns, Bobette Mo, MD for primary care. I reached out to Sherlean Foot Silman by phone today to offer care coordination services.  Ms. Schweers was given information about Care Coordination services today including:   The Care Coordination services include support from the care team which includes your Nurse Coordinator, Clinical Social Worker, or Pharmacist.  The Care Coordination team is here to help remove barriers to the health concerns and goals most important to you. Care Coordination services are voluntary, and the patient may decline or stop services at any time by request to their care team member.   Care Coordination Consent Status: Patient agreed to services and verbal consent obtained.   Follow up plan:  Telephone appointment with care coordination team member scheduled for:  05/27/2022  Encounter Outcome:  Pt. Scheduled from referral   Burman Nieves, [facility] Care Coordination Care Guide Direct Dial: [phone]

## 2022-05-27 ENCOUNTER — Other Ambulatory Visit: Payer: Self-pay | Admitting: Internal Medicine

## 2022-05-27 ENCOUNTER — Ambulatory Visit: Payer: Self-pay | Admitting: Licensed Clinical Social Worker

## 2022-05-27 DIAGNOSIS — M48061 Spinal stenosis, lumbar region without neurogenic claudication: Secondary | ICD-10-CM

## 2022-05-27 DIAGNOSIS — R5381 Other malaise: Secondary | ICD-10-CM

## 2022-05-27 DIAGNOSIS — R2689 Other abnormalities of gait and mobility: Secondary | ICD-10-CM

## 2022-05-27 NOTE — Patient Outreach (Signed)
  Care Coordination  Initial Visit Note   05/27/2022 Name: RAETTA AGOSTINELLI MRN: [id number] DOB: [birth year]  Sherlean Foot [place] is a 88 y.o. year old female who sees Burns, Bobette Mo, MD for primary care. I spoke with  ARIONNE IAMS daughter Meriam Sprague [phone] by phone today.  What matters to the patients health and wellness today?  Exploring community home support options and helping patient remain independent as possible.  Daughter thinks patient would benefit from Home Health PT and OT to improve mobility and daily function.  Will communicate this to PCP.   Goals Addressed             This Visit's Progress    COMPLETED: Caregiver Resources       Activities and task to complete in order to accomplish goals.  Patient's daughter will assist Call your insurance provider for more information about your Enhanced Benefits (for home care) Review private pay home care options provided and discussed        SDOH assessments and interventions completed: completed during AWV  No  Care Coordination Interventions:  Yes, provided  Interventions Today    Flowsheet Row Most Recent Value  Chronic Disease   Chronic disease during today's visit Hypertension (HTN)  General Interventions   General Interventions Discussed/Reviewed General Interventions Discussed, Level of Care, Communication with  Communication with PCP/Specialists  Level of Care Assisted Living, Personal Care Services  [discussed home care options/ possible PT & OT, decline placement information at this time]  Education Interventions   Education Provided Provided Education  Provided Verbal Education On [employer], Development worker, community  Mental Health Interventions   Mental Health Discussed/Reviewed --  Teacher, English as a foreign language stress]  Safety Interventions   Safety Discussed/Reviewed Safety Discussed, Home Safety  Home Safety Need for home safety assessment, Contact provider for referral to PT/OT  Advanced Directive Interventions   Advanced  Directives Discussed/Reviewed Advanced Directives Discussed  [will bring document to next office visit]       Follow up plan: No further intervention required. Daughter does not desire on going support.  Encounter Outcome:  Pt. Visit Completed   Sammuel Hines, LCSW Social Work Care Coordination  [facility] Emmie Niemann [employer] [phone]

## 2022-05-27 NOTE — Patient Instructions (Signed)
Visit Information  Thank you for taking time to visit with me today. Please don't hesitate to contact me if I can be of assistance to you.   Following are the goals we discussed today:   Goals Addressed             This Visit's Progress    COMPLETED: Caregiver Resources       Activities and task to complete in order to accomplish goals.  Patient's daughter will assist Call your insurance provider for more information about your Enhanced Benefits (for home care) Review private pay home care options provided and discussed         Please call the care guide team at [phone] if you need to cancel or reschedule your appointment.   After visit summary is available   No further follow up required: by Social work at this time  Sammuel Hines, [employer] Social Work Care Coordination  [employer] [facility] [employer] [employer] [phone]

## 2022-05-29 DIAGNOSIS — B351 Tinea unguium: Secondary | ICD-10-CM | POA: Diagnosis not present

## 2022-05-29 DIAGNOSIS — Z96653 Presence of artificial knee joint, bilateral: Secondary | ICD-10-CM | POA: Diagnosis not present

## 2022-05-29 DIAGNOSIS — K449 Diaphragmatic hernia without obstruction or gangrene: Secondary | ICD-10-CM | POA: Diagnosis not present

## 2022-05-29 DIAGNOSIS — Z87891 Personal history of nicotine dependence: Secondary | ICD-10-CM | POA: Diagnosis not present

## 2022-05-29 DIAGNOSIS — M810 Age-related osteoporosis without current pathological fracture: Secondary | ICD-10-CM | POA: Diagnosis not present

## 2022-05-29 DIAGNOSIS — G508 Other disorders of trigeminal nerve: Secondary | ICD-10-CM | POA: Diagnosis not present

## 2022-05-29 DIAGNOSIS — Z85828 Personal history of other malignant neoplasm of skin: Secondary | ICD-10-CM | POA: Diagnosis not present

## 2022-05-29 DIAGNOSIS — M48061 Spinal stenosis, lumbar region without neurogenic claudication: Secondary | ICD-10-CM | POA: Diagnosis not present

## 2022-05-29 DIAGNOSIS — D649 Anemia, unspecified: Secondary | ICD-10-CM | POA: Diagnosis not present

## 2022-05-29 DIAGNOSIS — K5792 Diverticulitis of intestine, part unspecified, without perforation or abscess without bleeding: Secondary | ICD-10-CM | POA: Diagnosis not present

## 2022-05-29 DIAGNOSIS — H409 Unspecified glaucoma: Secondary | ICD-10-CM | POA: Diagnosis not present

## 2022-05-29 DIAGNOSIS — R7303 Prediabetes: Secondary | ICD-10-CM | POA: Diagnosis not present

## 2022-05-29 DIAGNOSIS — K219 Gastro-esophageal reflux disease without esophagitis: Secondary | ICD-10-CM | POA: Diagnosis not present

## 2022-05-29 DIAGNOSIS — G8929 Other chronic pain: Secondary | ICD-10-CM | POA: Diagnosis not present

## 2022-05-29 DIAGNOSIS — E785 Hyperlipidemia, unspecified: Secondary | ICD-10-CM | POA: Diagnosis not present

## 2022-05-29 DIAGNOSIS — D490 Neoplasm of unspecified behavior of digestive system: Secondary | ICD-10-CM | POA: Diagnosis not present

## 2022-05-29 DIAGNOSIS — I119 Hypertensive heart disease without heart failure: Secondary | ICD-10-CM | POA: Diagnosis not present

## 2022-05-29 DIAGNOSIS — E213 Hyperparathyroidism, unspecified: Secondary | ICD-10-CM | POA: Diagnosis not present

## 2022-05-29 DIAGNOSIS — M150 Primary generalized (osteo)arthritis: Secondary | ICD-10-CM | POA: Diagnosis not present

## 2022-05-29 DIAGNOSIS — G5601 Carpal tunnel syndrome, right upper limb: Secondary | ICD-10-CM | POA: Diagnosis not present

## 2022-05-29 DIAGNOSIS — Z9181 History of falling: Secondary | ICD-10-CM | POA: Diagnosis not present

## 2022-05-29 DIAGNOSIS — J45909 Unspecified asthma, uncomplicated: Secondary | ICD-10-CM | POA: Diagnosis not present

## 2022-06-01 ENCOUNTER — Telehealth: Payer: Self-pay | Admitting: Internal Medicine

## 2022-06-01 NOTE — Telephone Encounter (Signed)
Pt daughter Deirdre Pippins called stating that the pt social worker need orders put in for PT and OT for homehealth. Please give daughter a called with update  [phone]

## 2022-06-01 NOTE — Telephone Encounter (Signed)
This was done on 5/2

## 2022-06-03 DIAGNOSIS — G8929 Other chronic pain: Secondary | ICD-10-CM | POA: Diagnosis not present

## 2022-06-03 DIAGNOSIS — I119 Hypertensive heart disease without heart failure: Secondary | ICD-10-CM | POA: Diagnosis not present

## 2022-06-03 DIAGNOSIS — R7303 Prediabetes: Secondary | ICD-10-CM | POA: Diagnosis not present

## 2022-06-03 DIAGNOSIS — J45909 Unspecified asthma, uncomplicated: Secondary | ICD-10-CM | POA: Diagnosis not present

## 2022-06-03 DIAGNOSIS — M48061 Spinal stenosis, lumbar region without neurogenic claudication: Secondary | ICD-10-CM | POA: Diagnosis not present

## 2022-06-03 DIAGNOSIS — M150 Primary generalized (osteo)arthritis: Secondary | ICD-10-CM | POA: Diagnosis not present

## 2022-06-04 DIAGNOSIS — M48061 Spinal stenosis, lumbar region without neurogenic claudication: Secondary | ICD-10-CM

## 2022-06-04 DIAGNOSIS — D649 Anemia, unspecified: Secondary | ICD-10-CM

## 2022-06-04 DIAGNOSIS — G508 Other disorders of trigeminal nerve: Secondary | ICD-10-CM

## 2022-06-04 DIAGNOSIS — D490 Neoplasm of unspecified behavior of digestive system: Secondary | ICD-10-CM

## 2022-06-04 DIAGNOSIS — J45909 Unspecified asthma, uncomplicated: Secondary | ICD-10-CM

## 2022-06-04 DIAGNOSIS — Z96653 Presence of artificial knee joint, bilateral: Secondary | ICD-10-CM

## 2022-06-04 DIAGNOSIS — H409 Unspecified glaucoma: Secondary | ICD-10-CM

## 2022-06-04 DIAGNOSIS — Z87891 Personal history of nicotine dependence: Secondary | ICD-10-CM

## 2022-06-04 DIAGNOSIS — I119 Hypertensive heart disease without heart failure: Secondary | ICD-10-CM

## 2022-06-04 DIAGNOSIS — Z9181 History of falling: Secondary | ICD-10-CM

## 2022-06-04 DIAGNOSIS — R7303 Prediabetes: Secondary | ICD-10-CM

## 2022-06-04 DIAGNOSIS — G5601 Carpal tunnel syndrome, right upper limb: Secondary | ICD-10-CM

## 2022-06-04 DIAGNOSIS — B351 Tinea unguium: Secondary | ICD-10-CM

## 2022-06-04 DIAGNOSIS — K219 Gastro-esophageal reflux disease without esophagitis: Secondary | ICD-10-CM

## 2022-06-04 DIAGNOSIS — K449 Diaphragmatic hernia without obstruction or gangrene: Secondary | ICD-10-CM

## 2022-06-04 DIAGNOSIS — G8929 Other chronic pain: Secondary | ICD-10-CM

## 2022-06-04 DIAGNOSIS — K5792 Diverticulitis of intestine, part unspecified, without perforation or abscess without bleeding: Secondary | ICD-10-CM

## 2022-06-04 DIAGNOSIS — M810 Age-related osteoporosis without current pathological fracture: Secondary | ICD-10-CM

## 2022-06-04 DIAGNOSIS — M150 Primary generalized (osteo)arthritis: Secondary | ICD-10-CM

## 2022-06-04 DIAGNOSIS — Z85828 Personal history of other malignant neoplasm of skin: Secondary | ICD-10-CM

## 2022-06-04 DIAGNOSIS — E785 Hyperlipidemia, unspecified: Secondary | ICD-10-CM

## 2022-06-04 DIAGNOSIS — E213 Hyperparathyroidism, unspecified: Secondary | ICD-10-CM

## 2022-06-09 DIAGNOSIS — M150 Primary generalized (osteo)arthritis: Secondary | ICD-10-CM | POA: Diagnosis not present

## 2022-06-09 DIAGNOSIS — M48061 Spinal stenosis, lumbar region without neurogenic claudication: Secondary | ICD-10-CM | POA: Diagnosis not present

## 2022-06-09 DIAGNOSIS — G8929 Other chronic pain: Secondary | ICD-10-CM | POA: Diagnosis not present

## 2022-06-09 DIAGNOSIS — J45909 Unspecified asthma, uncomplicated: Secondary | ICD-10-CM | POA: Diagnosis not present

## 2022-06-09 DIAGNOSIS — R7303 Prediabetes: Secondary | ICD-10-CM | POA: Diagnosis not present

## 2022-06-09 DIAGNOSIS — I119 Hypertensive heart disease without heart failure: Secondary | ICD-10-CM | POA: Diagnosis not present

## 2022-06-12 DIAGNOSIS — R7303 Prediabetes: Secondary | ICD-10-CM | POA: Diagnosis not present

## 2022-06-12 DIAGNOSIS — J45909 Unspecified asthma, uncomplicated: Secondary | ICD-10-CM | POA: Diagnosis not present

## 2022-06-12 DIAGNOSIS — G8929 Other chronic pain: Secondary | ICD-10-CM | POA: Diagnosis not present

## 2022-06-12 DIAGNOSIS — I119 Hypertensive heart disease without heart failure: Secondary | ICD-10-CM | POA: Diagnosis not present

## 2022-06-12 DIAGNOSIS — M48061 Spinal stenosis, lumbar region without neurogenic claudication: Secondary | ICD-10-CM | POA: Diagnosis not present

## 2022-06-12 DIAGNOSIS — M150 Primary generalized (osteo)arthritis: Secondary | ICD-10-CM | POA: Diagnosis not present

## 2022-06-14 ENCOUNTER — Other Ambulatory Visit: Payer: Self-pay | Admitting: Internal Medicine

## 2022-06-15 DIAGNOSIS — M48061 Spinal stenosis, lumbar region without neurogenic claudication: Secondary | ICD-10-CM | POA: Diagnosis not present

## 2022-06-15 DIAGNOSIS — M150 Primary generalized (osteo)arthritis: Secondary | ICD-10-CM | POA: Diagnosis not present

## 2022-06-15 DIAGNOSIS — R7303 Prediabetes: Secondary | ICD-10-CM | POA: Diagnosis not present

## 2022-06-15 DIAGNOSIS — G8929 Other chronic pain: Secondary | ICD-10-CM | POA: Diagnosis not present

## 2022-06-15 DIAGNOSIS — J45909 Unspecified asthma, uncomplicated: Secondary | ICD-10-CM | POA: Diagnosis not present

## 2022-06-15 DIAGNOSIS — I119 Hypertensive heart disease without heart failure: Secondary | ICD-10-CM | POA: Diagnosis not present

## 2022-06-16 ENCOUNTER — Telehealth: Payer: Self-pay | Admitting: Internal Medicine

## 2022-06-16 NOTE — Telephone Encounter (Signed)
Caller & What Company:  Judeth Cornfield with [facility]   Phone Number:  [phone]   Needs Verbal orders for what service & frequency:  Occupational therapy 1 time a week for 6 weeks

## 2022-06-16 NOTE — Telephone Encounter (Signed)
Verbals given today. °

## 2022-06-16 NOTE — Telephone Encounter (Signed)
Okay for orders? 

## 2022-06-17 ENCOUNTER — Telehealth: Payer: Self-pay | Admitting: Internal Medicine

## 2022-06-17 DIAGNOSIS — M48061 Spinal stenosis, lumbar region without neurogenic claudication: Secondary | ICD-10-CM | POA: Diagnosis not present

## 2022-06-17 DIAGNOSIS — M150 Primary generalized (osteo)arthritis: Secondary | ICD-10-CM | POA: Diagnosis not present

## 2022-06-17 DIAGNOSIS — R7303 Prediabetes: Secondary | ICD-10-CM | POA: Diagnosis not present

## 2022-06-17 DIAGNOSIS — I119 Hypertensive heart disease without heart failure: Secondary | ICD-10-CM | POA: Diagnosis not present

## 2022-06-17 DIAGNOSIS — G8929 Other chronic pain: Secondary | ICD-10-CM | POA: Diagnosis not present

## 2022-06-17 DIAGNOSIS — J45909 Unspecified asthma, uncomplicated: Secondary | ICD-10-CM | POA: Diagnosis not present

## 2022-06-17 NOTE — Telephone Encounter (Signed)
Caller & What Company:  Sheria Lang with [facility]   Phone [phone]   Needs Verbal orders for what service & frequency:  Social work evaluation for cognition and nursing evaluation for medication management and assess wounds on head

## 2022-06-17 NOTE — Telephone Encounter (Signed)
Okay for orders? 

## 2022-06-18 NOTE — Telephone Encounter (Signed)
Message left today for verbals. 

## 2022-06-23 DIAGNOSIS — M150 Primary generalized (osteo)arthritis: Secondary | ICD-10-CM | POA: Diagnosis not present

## 2022-06-23 DIAGNOSIS — J45909 Unspecified asthma, uncomplicated: Secondary | ICD-10-CM | POA: Diagnosis not present

## 2022-06-23 DIAGNOSIS — M48061 Spinal stenosis, lumbar region without neurogenic claudication: Secondary | ICD-10-CM | POA: Diagnosis not present

## 2022-06-23 DIAGNOSIS — R7303 Prediabetes: Secondary | ICD-10-CM | POA: Diagnosis not present

## 2022-06-23 DIAGNOSIS — G8929 Other chronic pain: Secondary | ICD-10-CM | POA: Diagnosis not present

## 2022-06-23 DIAGNOSIS — I119 Hypertensive heart disease without heart failure: Secondary | ICD-10-CM | POA: Diagnosis not present

## 2022-06-24 DIAGNOSIS — M48061 Spinal stenosis, lumbar region without neurogenic claudication: Secondary | ICD-10-CM | POA: Diagnosis not present

## 2022-06-24 DIAGNOSIS — M150 Primary generalized (osteo)arthritis: Secondary | ICD-10-CM | POA: Diagnosis not present

## 2022-06-24 DIAGNOSIS — R7303 Prediabetes: Secondary | ICD-10-CM | POA: Diagnosis not present

## 2022-06-24 DIAGNOSIS — G8929 Other chronic pain: Secondary | ICD-10-CM | POA: Diagnosis not present

## 2022-06-24 DIAGNOSIS — J45909 Unspecified asthma, uncomplicated: Secondary | ICD-10-CM | POA: Diagnosis not present

## 2022-06-24 DIAGNOSIS — I119 Hypertensive heart disease without heart failure: Secondary | ICD-10-CM | POA: Diagnosis not present

## 2022-06-25 ENCOUNTER — Telehealth: Payer: Self-pay | Admitting: Internal Medicine

## 2022-06-25 DIAGNOSIS — G8929 Other chronic pain: Secondary | ICD-10-CM | POA: Diagnosis not present

## 2022-06-25 DIAGNOSIS — M48061 Spinal stenosis, lumbar region without neurogenic claudication: Secondary | ICD-10-CM | POA: Diagnosis not present

## 2022-06-25 DIAGNOSIS — M150 Primary generalized (osteo)arthritis: Secondary | ICD-10-CM | POA: Diagnosis not present

## 2022-06-25 DIAGNOSIS — I119 Hypertensive heart disease without heart failure: Secondary | ICD-10-CM | POA: Diagnosis not present

## 2022-06-25 DIAGNOSIS — J45909 Unspecified asthma, uncomplicated: Secondary | ICD-10-CM | POA: Diagnosis not present

## 2022-06-25 DIAGNOSIS — R7303 Prediabetes: Secondary | ICD-10-CM | POA: Diagnosis not present

## 2022-06-25 NOTE — Telephone Encounter (Signed)
Caller & What Company:Taylor from [facility]     Phone [phone]   Needs Verbal orders for what service & frequency:  Speech therapy once a week for 4 weeks, medical social work evaluation.  Patient was given the [place] Cognitive Test:  Score 20 out of 30  Patient's family was encouraged to talk to Dr. Lawerance Bach about memory changes.

## 2022-06-27 NOTE — Telephone Encounter (Signed)
Okay for orders? 

## 2022-06-28 DIAGNOSIS — D490 Neoplasm of unspecified behavior of digestive system: Secondary | ICD-10-CM | POA: Diagnosis not present

## 2022-06-28 DIAGNOSIS — J45909 Unspecified asthma, uncomplicated: Secondary | ICD-10-CM | POA: Diagnosis not present

## 2022-06-28 DIAGNOSIS — G8929 Other chronic pain: Secondary | ICD-10-CM | POA: Diagnosis not present

## 2022-06-28 DIAGNOSIS — M150 Primary generalized (osteo)arthritis: Secondary | ICD-10-CM | POA: Diagnosis not present

## 2022-06-28 DIAGNOSIS — Z85828 Personal history of other malignant neoplasm of skin: Secondary | ICD-10-CM | POA: Diagnosis not present

## 2022-06-28 DIAGNOSIS — E213 Hyperparathyroidism, unspecified: Secondary | ICD-10-CM | POA: Diagnosis not present

## 2022-06-28 DIAGNOSIS — M810 Age-related osteoporosis without current pathological fracture: Secondary | ICD-10-CM | POA: Diagnosis not present

## 2022-06-28 DIAGNOSIS — I119 Hypertensive heart disease without heart failure: Secondary | ICD-10-CM | POA: Diagnosis not present

## 2022-06-28 DIAGNOSIS — G508 Other disorders of trigeminal nerve: Secondary | ICD-10-CM | POA: Diagnosis not present

## 2022-06-28 DIAGNOSIS — Z96653 Presence of artificial knee joint, bilateral: Secondary | ICD-10-CM | POA: Diagnosis not present

## 2022-06-28 DIAGNOSIS — E785 Hyperlipidemia, unspecified: Secondary | ICD-10-CM | POA: Diagnosis not present

## 2022-06-28 DIAGNOSIS — Z87891 Personal history of nicotine dependence: Secondary | ICD-10-CM | POA: Diagnosis not present

## 2022-06-28 DIAGNOSIS — D649 Anemia, unspecified: Secondary | ICD-10-CM | POA: Diagnosis not present

## 2022-06-28 DIAGNOSIS — H409 Unspecified glaucoma: Secondary | ICD-10-CM | POA: Diagnosis not present

## 2022-06-28 DIAGNOSIS — K5792 Diverticulitis of intestine, part unspecified, without perforation or abscess without bleeding: Secondary | ICD-10-CM | POA: Diagnosis not present

## 2022-06-28 DIAGNOSIS — M48061 Spinal stenosis, lumbar region without neurogenic claudication: Secondary | ICD-10-CM | POA: Diagnosis not present

## 2022-06-28 DIAGNOSIS — R7303 Prediabetes: Secondary | ICD-10-CM | POA: Diagnosis not present

## 2022-06-28 DIAGNOSIS — K219 Gastro-esophageal reflux disease without esophagitis: Secondary | ICD-10-CM | POA: Diagnosis not present

## 2022-06-28 DIAGNOSIS — B351 Tinea unguium: Secondary | ICD-10-CM | POA: Diagnosis not present

## 2022-06-28 DIAGNOSIS — G5601 Carpal tunnel syndrome, right upper limb: Secondary | ICD-10-CM | POA: Diagnosis not present

## 2022-06-28 DIAGNOSIS — Z9181 History of falling: Secondary | ICD-10-CM | POA: Diagnosis not present

## 2022-06-28 DIAGNOSIS — K449 Diaphragmatic hernia without obstruction or gangrene: Secondary | ICD-10-CM | POA: Diagnosis not present

## 2022-06-28 NOTE — Telephone Encounter (Signed)
Message left today with verbals. 

## 2022-07-01 DIAGNOSIS — G8929 Other chronic pain: Secondary | ICD-10-CM | POA: Diagnosis not present

## 2022-07-01 DIAGNOSIS — M48061 Spinal stenosis, lumbar region without neurogenic claudication: Secondary | ICD-10-CM | POA: Diagnosis not present

## 2022-07-01 DIAGNOSIS — I119 Hypertensive heart disease without heart failure: Secondary | ICD-10-CM | POA: Diagnosis not present

## 2022-07-01 DIAGNOSIS — J45909 Unspecified asthma, uncomplicated: Secondary | ICD-10-CM | POA: Diagnosis not present

## 2022-07-01 DIAGNOSIS — R7303 Prediabetes: Secondary | ICD-10-CM | POA: Diagnosis not present

## 2022-07-01 DIAGNOSIS — M150 Primary generalized (osteo)arthritis: Secondary | ICD-10-CM | POA: Diagnosis not present

## 2022-07-02 ENCOUNTER — Telehealth: Payer: Self-pay | Admitting: Internal Medicine

## 2022-07-02 DIAGNOSIS — M48061 Spinal stenosis, lumbar region without neurogenic claudication: Secondary | ICD-10-CM | POA: Diagnosis not present

## 2022-07-02 DIAGNOSIS — G8929 Other chronic pain: Secondary | ICD-10-CM | POA: Diagnosis not present

## 2022-07-02 DIAGNOSIS — R7303 Prediabetes: Secondary | ICD-10-CM | POA: Diagnosis not present

## 2022-07-02 DIAGNOSIS — I119 Hypertensive heart disease without heart failure: Secondary | ICD-10-CM | POA: Diagnosis not present

## 2022-07-02 DIAGNOSIS — J45909 Unspecified asthma, uncomplicated: Secondary | ICD-10-CM | POA: Diagnosis not present

## 2022-07-02 DIAGNOSIS — M150 Primary generalized (osteo)arthritis: Secondary | ICD-10-CM | POA: Diagnosis not present

## 2022-07-02 NOTE — Telephone Encounter (Signed)
Verbals given today. °

## 2022-07-02 NOTE — Telephone Encounter (Signed)
Melissa from [facility] was not able to see the pt this week because she wasn't able to contact her or the family. Efraim Kaufmann is asking for a continuation of care to make up for this week with a frequency of: 2X for 1 week & 1X for discharge  Please call Melissa to confirm (VM is secure): [phone]

## 2022-07-05 DIAGNOSIS — R7303 Prediabetes: Secondary | ICD-10-CM | POA: Diagnosis not present

## 2022-07-05 DIAGNOSIS — G8929 Other chronic pain: Secondary | ICD-10-CM | POA: Diagnosis not present

## 2022-07-05 DIAGNOSIS — M150 Primary generalized (osteo)arthritis: Secondary | ICD-10-CM | POA: Diagnosis not present

## 2022-07-05 DIAGNOSIS — J45909 Unspecified asthma, uncomplicated: Secondary | ICD-10-CM | POA: Diagnosis not present

## 2022-07-05 DIAGNOSIS — I119 Hypertensive heart disease without heart failure: Secondary | ICD-10-CM | POA: Diagnosis not present

## 2022-07-05 DIAGNOSIS — M48061 Spinal stenosis, lumbar region without neurogenic claudication: Secondary | ICD-10-CM | POA: Diagnosis not present

## 2022-07-06 DIAGNOSIS — R7303 Prediabetes: Secondary | ICD-10-CM | POA: Diagnosis not present

## 2022-07-06 DIAGNOSIS — I119 Hypertensive heart disease without heart failure: Secondary | ICD-10-CM | POA: Diagnosis not present

## 2022-07-06 DIAGNOSIS — M150 Primary generalized (osteo)arthritis: Secondary | ICD-10-CM | POA: Diagnosis not present

## 2022-07-06 DIAGNOSIS — G8929 Other chronic pain: Secondary | ICD-10-CM | POA: Diagnosis not present

## 2022-07-06 DIAGNOSIS — J45909 Unspecified asthma, uncomplicated: Secondary | ICD-10-CM | POA: Diagnosis not present

## 2022-07-06 DIAGNOSIS — M48061 Spinal stenosis, lumbar region without neurogenic claudication: Secondary | ICD-10-CM | POA: Diagnosis not present

## 2022-07-08 DIAGNOSIS — J45909 Unspecified asthma, uncomplicated: Secondary | ICD-10-CM | POA: Diagnosis not present

## 2022-07-08 DIAGNOSIS — G8929 Other chronic pain: Secondary | ICD-10-CM | POA: Diagnosis not present

## 2022-07-08 DIAGNOSIS — M48061 Spinal stenosis, lumbar region without neurogenic claudication: Secondary | ICD-10-CM | POA: Diagnosis not present

## 2022-07-08 DIAGNOSIS — R7303 Prediabetes: Secondary | ICD-10-CM | POA: Diagnosis not present

## 2022-07-08 DIAGNOSIS — M150 Primary generalized (osteo)arthritis: Secondary | ICD-10-CM | POA: Diagnosis not present

## 2022-07-08 DIAGNOSIS — I119 Hypertensive heart disease without heart failure: Secondary | ICD-10-CM | POA: Diagnosis not present

## 2022-07-09 DIAGNOSIS — I119 Hypertensive heart disease without heart failure: Secondary | ICD-10-CM | POA: Diagnosis not present

## 2022-07-09 DIAGNOSIS — R7303 Prediabetes: Secondary | ICD-10-CM | POA: Diagnosis not present

## 2022-07-09 DIAGNOSIS — G8929 Other chronic pain: Secondary | ICD-10-CM | POA: Diagnosis not present

## 2022-07-09 DIAGNOSIS — M150 Primary generalized (osteo)arthritis: Secondary | ICD-10-CM | POA: Diagnosis not present

## 2022-07-09 DIAGNOSIS — M48061 Spinal stenosis, lumbar region without neurogenic claudication: Secondary | ICD-10-CM | POA: Diagnosis not present

## 2022-07-09 DIAGNOSIS — J45909 Unspecified asthma, uncomplicated: Secondary | ICD-10-CM | POA: Diagnosis not present

## 2022-07-12 DIAGNOSIS — M150 Primary generalized (osteo)arthritis: Secondary | ICD-10-CM | POA: Diagnosis not present

## 2022-07-12 DIAGNOSIS — I119 Hypertensive heart disease without heart failure: Secondary | ICD-10-CM | POA: Diagnosis not present

## 2022-07-12 DIAGNOSIS — J45909 Unspecified asthma, uncomplicated: Secondary | ICD-10-CM | POA: Diagnosis not present

## 2022-07-12 DIAGNOSIS — M48061 Spinal stenosis, lumbar region without neurogenic claudication: Secondary | ICD-10-CM | POA: Diagnosis not present

## 2022-07-12 DIAGNOSIS — R7303 Prediabetes: Secondary | ICD-10-CM | POA: Diagnosis not present

## 2022-07-12 DIAGNOSIS — G8929 Other chronic pain: Secondary | ICD-10-CM | POA: Diagnosis not present

## 2022-07-13 DIAGNOSIS — R7303 Prediabetes: Secondary | ICD-10-CM | POA: Diagnosis not present

## 2022-07-13 DIAGNOSIS — J45909 Unspecified asthma, uncomplicated: Secondary | ICD-10-CM | POA: Diagnosis not present

## 2022-07-13 DIAGNOSIS — M150 Primary generalized (osteo)arthritis: Secondary | ICD-10-CM | POA: Diagnosis not present

## 2022-07-13 DIAGNOSIS — I119 Hypertensive heart disease without heart failure: Secondary | ICD-10-CM | POA: Diagnosis not present

## 2022-07-13 DIAGNOSIS — G8929 Other chronic pain: Secondary | ICD-10-CM | POA: Diagnosis not present

## 2022-07-13 DIAGNOSIS — M48061 Spinal stenosis, lumbar region without neurogenic claudication: Secondary | ICD-10-CM | POA: Diagnosis not present

## 2022-07-14 DIAGNOSIS — G8929 Other chronic pain: Secondary | ICD-10-CM | POA: Diagnosis not present

## 2022-07-14 DIAGNOSIS — J45909 Unspecified asthma, uncomplicated: Secondary | ICD-10-CM | POA: Diagnosis not present

## 2022-07-14 DIAGNOSIS — M48061 Spinal stenosis, lumbar region without neurogenic claudication: Secondary | ICD-10-CM | POA: Diagnosis not present

## 2022-07-14 DIAGNOSIS — I119 Hypertensive heart disease without heart failure: Secondary | ICD-10-CM | POA: Diagnosis not present

## 2022-07-14 DIAGNOSIS — M150 Primary generalized (osteo)arthritis: Secondary | ICD-10-CM | POA: Diagnosis not present

## 2022-07-14 DIAGNOSIS — R7303 Prediabetes: Secondary | ICD-10-CM | POA: Diagnosis not present

## 2022-07-20 ENCOUNTER — Telehealth: Payer: Self-pay | Admitting: Internal Medicine

## 2022-07-20 DIAGNOSIS — I119 Hypertensive heart disease without heart failure: Secondary | ICD-10-CM | POA: Diagnosis not present

## 2022-07-20 DIAGNOSIS — R7303 Prediabetes: Secondary | ICD-10-CM | POA: Diagnosis not present

## 2022-07-20 DIAGNOSIS — G8929 Other chronic pain: Secondary | ICD-10-CM | POA: Diagnosis not present

## 2022-07-20 DIAGNOSIS — M48061 Spinal stenosis, lumbar region without neurogenic claudication: Secondary | ICD-10-CM | POA: Diagnosis not present

## 2022-07-20 DIAGNOSIS — J45909 Unspecified asthma, uncomplicated: Secondary | ICD-10-CM | POA: Diagnosis not present

## 2022-07-20 DIAGNOSIS — M150 Primary generalized (osteo)arthritis: Secondary | ICD-10-CM | POA: Diagnosis not present

## 2022-07-20 NOTE — Telephone Encounter (Signed)
LVM on Pt's son Loraine Leriche phone to get further information about Ms. Zuzanna edema and weeping clear fluid. Told him to give a call back to the office

## 2022-07-20 NOTE — Telephone Encounter (Signed)
When patient or son returns call-she needs an appointment

## 2022-07-20 NOTE — Telephone Encounter (Signed)
Ladona Ridgel from [facility] called and said patient's right leg has edema and is weeping clear fluid. She and the patient said to call patient's son Loraine Leriche at [phone]. Callback for Ladona Ridgel is [phone]).

## 2022-07-22 DIAGNOSIS — M150 Primary generalized (osteo)arthritis: Secondary | ICD-10-CM | POA: Diagnosis not present

## 2022-07-22 DIAGNOSIS — R7303 Prediabetes: Secondary | ICD-10-CM | POA: Diagnosis not present

## 2022-07-22 DIAGNOSIS — M48061 Spinal stenosis, lumbar region without neurogenic claudication: Secondary | ICD-10-CM | POA: Diagnosis not present

## 2022-07-22 DIAGNOSIS — I119 Hypertensive heart disease without heart failure: Secondary | ICD-10-CM | POA: Diagnosis not present

## 2022-07-22 DIAGNOSIS — G8929 Other chronic pain: Secondary | ICD-10-CM | POA: Diagnosis not present

## 2022-07-22 DIAGNOSIS — J45909 Unspecified asthma, uncomplicated: Secondary | ICD-10-CM | POA: Diagnosis not present

## 2022-07-25 ENCOUNTER — Encounter: Payer: Self-pay | Admitting: Internal Medicine

## 2022-07-25 NOTE — Progress Notes (Unsigned)
    Subjective:    Patient ID: Anna Adams, female    DOB: 1934/06/24, 88 y.o.   MRN: [id number]      HPI Anna Adams is here for No chief complaint on file.    Swollen feet - chronic - worse.  L chronically more swollen than R.     Echo 5/23 - EF 60-65%, grade 1 DD   Medications and allergies reviewed with patient and updated if appropriate.  Current Outpatient Medications on File Prior to Visit  Medication Sig Dispense Refill   atorvastatin (LIPITOR) 20 MG tablet NEW PRESCRIPTION REQUEST: TAKE ONE TABLET BY MOUTH DAILY 90 tablet 3   b complex vitamins tablet Take 1 tablet by mouth daily.     Biotin 40981 MCG TABS See admin instructions.     cholecalciferol (VITAMIN D) 1000 UNITS tablet Take 400 Units by mouth daily.     etodolac (LODINE XL) 400 MG 24 hr tablet TAKE 1 TABLET BY MOUTH EVERY DAY 90 tablet 0   furosemide (LASIX) 20 MG tablet NEW PRESCRIPTION REQUEST: TAKE ONE TABLET BY MOUTH DAILY 90 tablet 3   GNP ACID REDUCER MAXIMUM ST 20 MG tablet NEW PRESCRIPTION REQUEST: TAKE ONE TABLET BY MOUTH DAILY 90 tablet 3   hydrocortisone-pramoxine (ANALPRAM HC) 2.5-1 % rectal cream Place 1 application rectally 3 (three) times daily. 30 g 0   latanoprost (XALATAN) 0.005 % ophthalmic solution      levothyroxine (SYNTHROID) 125 MCG tablet NEW PRESCRIPTION REQUEST: TAKE ONE TABLET BY MOUTH DAILY 90 tablet 3   NORVASC 5 MG tablet NEW PRESCRIPTION REQUEST: TAKE ONE TABLET BY MOUTH DAILY 90 tablet 3   Pramoxine-HC (HYDROCORTISONE ACE-PRAMOXINE) 2.5-1 % CREA Place rectally.     PROVENTIL HFA 108 (90 Base) MCG/ACT inhaler INHALE 1-2 PUFFS INTO THE LUNGS EVERY 6 (SIX) HOURS AS NEEDED FOR WHEEZING. 6.7 Inhaler 7   telmisartan (MICARDIS) 80 MG tablet TAKE 1 TABLET BY MOUTH EVERY DAY 90 tablet 1   traMADol (ULTRAM) 50 MG tablet Take 1 tablet (50 mg total) by mouth every 8 (eight) hours. 90 tablet 2   UNABLE TO FIND at bedtime. Osteo-ease     vitamin E 200 UNIT capsule Take 200 Units by mouth  daily.     zoledronic acid (RECLAST) 5 MG/100ML SOLN injection See admin instructions.     No current facility-administered medications on file prior to visit.    Review of Systems     Objective:  There were no vitals filed for this visit. BP Readings from Last 3 Encounters:  05/24/22 132/72  05/24/22 132/72  11/18/21 136/80   Wt Readings from Last 3 Encounters:  05/24/22 142 lb (64.4 kg)  05/24/22 142 lb (64.4 kg)  11/18/21 141 lb (64 kg)   There is no height or weight on file to calculate BMI.    Physical Exam         Assessment & Plan:    See Problem List for Assessment and Plan of chronic medical problems.

## 2022-07-26 ENCOUNTER — Ambulatory Visit (INDEPENDENT_AMBULATORY_CARE_PROVIDER_SITE_OTHER): Payer: Medicare Other | Admitting: Internal Medicine

## 2022-07-26 VITALS — BP 132/80 | HR 67 | Temp 98.1°F | Ht 59.0 in

## 2022-07-26 DIAGNOSIS — R600 Localized edema: Secondary | ICD-10-CM | POA: Diagnosis not present

## 2022-07-26 LAB — COMPREHENSIVE METABOLIC PANEL
ALT: 10 U/L (ref 0–35)
AST: 22 U/L (ref 0–37)
Albumin: 4 g/dL (ref 3.5–5.2)
Alkaline Phosphatase: 76 U/L (ref 39–117)
BUN: 33 mg/dL — ABNORMAL HIGH (ref 6–23)
CO2: 32 mEq/L (ref 19–32)
Calcium: 10.9 mg/dL — ABNORMAL HIGH (ref 8.4–10.5)
Chloride: 94 mEq/L — ABNORMAL LOW (ref 96–112)
Creatinine, Ser: 1.03 mg/dL (ref 0.40–1.20)
GFR: 49.26 mL/min — ABNORMAL LOW (ref >60.00)
Glucose, Bld: 85 mg/dL (ref 70–99)
Potassium: 4.7 mEq/L (ref 3.5–5.1)
Sodium: 132 mEq/L — ABNORMAL LOW (ref 135–145)
Total Bilirubin: 0.9 mg/dL (ref 0.2–1.2)
Total Protein: 6.6 g/dL (ref 6.0–8.3)

## 2022-07-26 LAB — BRAIN NATRIURETIC PEPTIDE: Pro B Natriuretic peptide (BNP): 45 pg/mL (ref 0.0–100.0)

## 2022-07-26 MED ORDER — FUROSEMIDE 20 MG PO TABS: ORAL_TABLET | ORAL | 3 refills | Status: DC

## 2022-07-26 MED ORDER — FUROSEMIDE 40 MG PO TABS: 40.0000 mg | ORAL_TABLET | Freq: Every day | ORAL | 3 refills | Status: DC

## 2022-07-26 MED ORDER — POTASSIUM CHLORIDE CRYS ER 20 MEQ PO TBCR
20.0000 meq | EXTENDED_RELEASE_TABLET | Freq: Every day | ORAL | 5 refills | Status: DC
Start: 2022-07-26 — End: 2023-11-05

## 2022-07-26 NOTE — Patient Instructions (Addendum)
      Blood work was ordered.      Medications changes include :   increase lasix to 40 mg daily and 20 mg in the early afternoon.   Take potassium pill daily.       Return for follow up next week.

## 2022-07-26 NOTE — Assessment & Plan Note (Signed)
Chronic Worsening over the past several weeks No shortness of breath, but is sedentary Likely has chronic venous insufficiency be has gotten worse-she is very sedentary, but she is elevating legs Not compliant with a low-sodium diet-stressed improving diet Encouraged getting up more walking around inside the house Will check CMP, BNP-I do not think there is any element of heart failure, but need to make sure Increase furosemide to 40 mg every morning, 20 mg every afternoon Start potassium chloride 20 mill equivalents daily Follow-up in 1 week-hopefully at that time can decrease Lasix a little bit and will do blood work then

## 2022-07-27 DIAGNOSIS — R7303 Prediabetes: Secondary | ICD-10-CM | POA: Diagnosis not present

## 2022-07-27 DIAGNOSIS — I119 Hypertensive heart disease without heart failure: Secondary | ICD-10-CM | POA: Diagnosis not present

## 2022-07-27 DIAGNOSIS — J45909 Unspecified asthma, uncomplicated: Secondary | ICD-10-CM | POA: Diagnosis not present

## 2022-07-27 DIAGNOSIS — M48061 Spinal stenosis, lumbar region without neurogenic claudication: Secondary | ICD-10-CM | POA: Diagnosis not present

## 2022-07-27 DIAGNOSIS — M150 Primary generalized (osteo)arthritis: Secondary | ICD-10-CM | POA: Diagnosis not present

## 2022-07-27 DIAGNOSIS — G8929 Other chronic pain: Secondary | ICD-10-CM | POA: Diagnosis not present

## 2022-07-28 DIAGNOSIS — Z85828 Personal history of other malignant neoplasm of skin: Secondary | ICD-10-CM | POA: Diagnosis not present

## 2022-07-28 DIAGNOSIS — E213 Hyperparathyroidism, unspecified: Secondary | ICD-10-CM | POA: Diagnosis not present

## 2022-07-28 DIAGNOSIS — R7303 Prediabetes: Secondary | ICD-10-CM | POA: Diagnosis not present

## 2022-07-28 DIAGNOSIS — M810 Age-related osteoporosis without current pathological fracture: Secondary | ICD-10-CM | POA: Diagnosis not present

## 2022-07-28 DIAGNOSIS — Z87891 Personal history of nicotine dependence: Secondary | ICD-10-CM | POA: Diagnosis not present

## 2022-07-28 DIAGNOSIS — Z9181 History of falling: Secondary | ICD-10-CM | POA: Diagnosis not present

## 2022-07-28 DIAGNOSIS — G8929 Other chronic pain: Secondary | ICD-10-CM | POA: Diagnosis not present

## 2022-07-28 DIAGNOSIS — E785 Hyperlipidemia, unspecified: Secondary | ICD-10-CM | POA: Diagnosis not present

## 2022-07-28 DIAGNOSIS — D490 Neoplasm of unspecified behavior of digestive system: Secondary | ICD-10-CM | POA: Diagnosis not present

## 2022-07-28 DIAGNOSIS — G5601 Carpal tunnel syndrome, right upper limb: Secondary | ICD-10-CM | POA: Diagnosis not present

## 2022-07-28 DIAGNOSIS — K449 Diaphragmatic hernia without obstruction or gangrene: Secondary | ICD-10-CM | POA: Diagnosis not present

## 2022-07-28 DIAGNOSIS — J45909 Unspecified asthma, uncomplicated: Secondary | ICD-10-CM | POA: Diagnosis not present

## 2022-07-28 DIAGNOSIS — I119 Hypertensive heart disease without heart failure: Secondary | ICD-10-CM | POA: Diagnosis not present

## 2022-07-28 DIAGNOSIS — M150 Primary generalized (osteo)arthritis: Secondary | ICD-10-CM | POA: Diagnosis not present

## 2022-07-28 DIAGNOSIS — K219 Gastro-esophageal reflux disease without esophagitis: Secondary | ICD-10-CM | POA: Diagnosis not present

## 2022-07-28 DIAGNOSIS — H409 Unspecified glaucoma: Secondary | ICD-10-CM | POA: Diagnosis not present

## 2022-07-28 DIAGNOSIS — G508 Other disorders of trigeminal nerve: Secondary | ICD-10-CM | POA: Diagnosis not present

## 2022-07-28 DIAGNOSIS — M48061 Spinal stenosis, lumbar region without neurogenic claudication: Secondary | ICD-10-CM | POA: Diagnosis not present

## 2022-07-28 DIAGNOSIS — Z96653 Presence of artificial knee joint, bilateral: Secondary | ICD-10-CM | POA: Diagnosis not present

## 2022-07-28 DIAGNOSIS — K5792 Diverticulitis of intestine, part unspecified, without perforation or abscess without bleeding: Secondary | ICD-10-CM | POA: Diagnosis not present

## 2022-07-28 DIAGNOSIS — D649 Anemia, unspecified: Secondary | ICD-10-CM | POA: Diagnosis not present

## 2022-07-28 DIAGNOSIS — B351 Tinea unguium: Secondary | ICD-10-CM | POA: Diagnosis not present

## 2022-07-29 DIAGNOSIS — J45909 Unspecified asthma, uncomplicated: Secondary | ICD-10-CM | POA: Diagnosis not present

## 2022-07-29 DIAGNOSIS — R7303 Prediabetes: Secondary | ICD-10-CM | POA: Diagnosis not present

## 2022-07-29 DIAGNOSIS — G8929 Other chronic pain: Secondary | ICD-10-CM | POA: Diagnosis not present

## 2022-07-29 DIAGNOSIS — M48061 Spinal stenosis, lumbar region without neurogenic claudication: Secondary | ICD-10-CM | POA: Diagnosis not present

## 2022-07-29 DIAGNOSIS — M150 Primary generalized (osteo)arthritis: Secondary | ICD-10-CM | POA: Diagnosis not present

## 2022-07-29 DIAGNOSIS — I119 Hypertensive heart disease without heart failure: Secondary | ICD-10-CM | POA: Diagnosis not present

## 2022-07-30 DIAGNOSIS — E785 Hyperlipidemia, unspecified: Secondary | ICD-10-CM

## 2022-07-30 DIAGNOSIS — K449 Diaphragmatic hernia without obstruction or gangrene: Secondary | ICD-10-CM | POA: Diagnosis not present

## 2022-07-30 DIAGNOSIS — G508 Other disorders of trigeminal nerve: Secondary | ICD-10-CM | POA: Diagnosis not present

## 2022-07-30 DIAGNOSIS — H409 Unspecified glaucoma: Secondary | ICD-10-CM | POA: Diagnosis not present

## 2022-07-30 DIAGNOSIS — G8929 Other chronic pain: Secondary | ICD-10-CM | POA: Diagnosis not present

## 2022-07-30 DIAGNOSIS — D649 Anemia, unspecified: Secondary | ICD-10-CM

## 2022-07-30 DIAGNOSIS — J45909 Unspecified asthma, uncomplicated: Secondary | ICD-10-CM | POA: Diagnosis not present

## 2022-07-30 DIAGNOSIS — M150 Primary generalized (osteo)arthritis: Secondary | ICD-10-CM | POA: Diagnosis not present

## 2022-07-30 DIAGNOSIS — M810 Age-related osteoporosis without current pathological fracture: Secondary | ICD-10-CM | POA: Diagnosis not present

## 2022-07-30 DIAGNOSIS — G5601 Carpal tunnel syndrome, right upper limb: Secondary | ICD-10-CM | POA: Diagnosis not present

## 2022-07-30 DIAGNOSIS — K5792 Diverticulitis of intestine, part unspecified, without perforation or abscess without bleeding: Secondary | ICD-10-CM | POA: Diagnosis not present

## 2022-07-30 DIAGNOSIS — K219 Gastro-esophageal reflux disease without esophagitis: Secondary | ICD-10-CM

## 2022-07-30 DIAGNOSIS — D490 Neoplasm of unspecified behavior of digestive system: Secondary | ICD-10-CM

## 2022-07-30 DIAGNOSIS — Z96653 Presence of artificial knee joint, bilateral: Secondary | ICD-10-CM

## 2022-07-30 DIAGNOSIS — E213 Hyperparathyroidism, unspecified: Secondary | ICD-10-CM

## 2022-07-30 DIAGNOSIS — Z85828 Personal history of other malignant neoplasm of skin: Secondary | ICD-10-CM

## 2022-07-30 DIAGNOSIS — Z9181 History of falling: Secondary | ICD-10-CM

## 2022-07-30 DIAGNOSIS — I119 Hypertensive heart disease without heart failure: Secondary | ICD-10-CM | POA: Diagnosis not present

## 2022-07-30 DIAGNOSIS — Z87891 Personal history of nicotine dependence: Secondary | ICD-10-CM

## 2022-07-30 DIAGNOSIS — B351 Tinea unguium: Secondary | ICD-10-CM

## 2022-07-30 DIAGNOSIS — R7303 Prediabetes: Secondary | ICD-10-CM | POA: Diagnosis not present

## 2022-07-30 DIAGNOSIS — M48061 Spinal stenosis, lumbar region without neurogenic claudication: Secondary | ICD-10-CM | POA: Diagnosis not present

## 2022-08-02 ENCOUNTER — Ambulatory Visit (INDEPENDENT_AMBULATORY_CARE_PROVIDER_SITE_OTHER): Payer: Medicare Other | Admitting: Internal Medicine

## 2022-08-02 ENCOUNTER — Encounter: Payer: Self-pay | Admitting: Internal Medicine

## 2022-08-02 VITALS — BP 128/76 | HR 70 | Temp 98.0°F | Ht 59.0 in

## 2022-08-02 DIAGNOSIS — J45909 Unspecified asthma, uncomplicated: Secondary | ICD-10-CM | POA: Diagnosis not present

## 2022-08-02 DIAGNOSIS — G8929 Other chronic pain: Secondary | ICD-10-CM | POA: Diagnosis not present

## 2022-08-02 DIAGNOSIS — M48061 Spinal stenosis, lumbar region without neurogenic claudication: Secondary | ICD-10-CM | POA: Diagnosis not present

## 2022-08-02 DIAGNOSIS — R7303 Prediabetes: Secondary | ICD-10-CM | POA: Diagnosis not present

## 2022-08-02 DIAGNOSIS — I119 Hypertensive heart disease without heart failure: Secondary | ICD-10-CM | POA: Diagnosis not present

## 2022-08-02 DIAGNOSIS — R600 Localized edema: Secondary | ICD-10-CM | POA: Diagnosis not present

## 2022-08-02 DIAGNOSIS — I10 Essential (primary) hypertension: Secondary | ICD-10-CM | POA: Diagnosis not present

## 2022-08-02 DIAGNOSIS — M150 Primary generalized (osteo)arthritis: Secondary | ICD-10-CM | POA: Diagnosis not present

## 2022-08-02 LAB — BASIC METABOLIC PANEL
BUN: 38 mg/dL — ABNORMAL HIGH (ref 6–23)
CO2: 34 mEq/L — ABNORMAL HIGH (ref 19–32)
Calcium: 10.4 mg/dL (ref 8.4–10.5)
Chloride: 92 mEq/L — ABNORMAL LOW (ref 96–112)
Creatinine, Ser: 1.27 mg/dL — ABNORMAL HIGH (ref 0.40–1.20)
GFR: 38.31 mL/min — ABNORMAL LOW (ref >60.00)
Glucose, Bld: 88 mg/dL (ref 70–99)
Potassium: 4.5 mEq/L (ref 3.5–5.1)
Sodium: 133 mEq/L — ABNORMAL LOW (ref 135–145)

## 2022-08-02 NOTE — Assessment & Plan Note (Signed)
Chronic Improved with increased lasix dose Stressed the need to elevate her legs consistently when sitting, stressed getting up and walking more Continue low-sodium diet BMP today Continue Lasix 40 mg in the morning and 20 mg in the afternoon-May need to adjust based on BMP

## 2022-08-02 NOTE — Addendum Note (Signed)
Addended by: Pincus Sanes on: 08/02/2022 09:01 PM   Modules accepted: Orders

## 2022-08-02 NOTE — Assessment & Plan Note (Signed)
Chronic BP well controlled Continue amlodipine 5 mg daily, telmisartan 80 mg daily cmp  

## 2022-08-02 NOTE — Progress Notes (Signed)
Subjective:    Patient ID: Anna Adams, female    DOB: Jun 11, 1934, 88 y.o.   MRN: [id number]     HPI Anna Adams is here for follow up of her chronic medical problems.  Here for follow-up edema-was here last week with acute on chronic bilateral leg edema.  Increase Lasix to 40 mg every morning, 20 mg every afternoon and started potassium.  She has been taking Lasix 20 mg daily.  Labs 1 week ago-decrease GFR 49.26.  BNP 45  She did lose 9 lbs in one day on the diuretic.  Legs are a little better per pt, not much better per daughter.   Not always elevating legs   Medications and allergies reviewed with patient and updated if appropriate.  Current Outpatient Medications on File Prior to Visit  Medication Sig Dispense Refill   atorvastatin (LIPITOR) 20 MG tablet NEW PRESCRIPTION REQUEST: TAKE ONE TABLET BY MOUTH DAILY 90 tablet 3   b complex vitamins tablet Take 1 tablet by mouth daily.     Biotin 69629 MCG TABS See admin instructions.     cholecalciferol (VITAMIN D) 1000 UNITS tablet Take 400 Units by mouth daily.     etodolac (LODINE XL) 400 MG 24 hr tablet TAKE 1 TABLET BY MOUTH EVERY DAY 90 tablet 0   furosemide (LASIX) 20 MG tablet Take 1 tab PO in early afternoon 90 tablet 3   furosemide (LASIX) 40 MG tablet Take 1 tablet (40 mg total) by mouth daily. Take in morning 30 tablet 3   GNP ACID REDUCER MAXIMUM ST 20 MG tablet NEW PRESCRIPTION REQUEST: TAKE ONE TABLET BY MOUTH DAILY 90 tablet 3   hydrocortisone-pramoxine (ANALPRAM HC) 2.5-1 % rectal cream Place 1 application rectally 3 (three) times daily. 30 g 0   latanoprost (XALATAN) 0.005 % ophthalmic solution      levothyroxine (SYNTHROID) 125 MCG tablet NEW PRESCRIPTION REQUEST: TAKE ONE TABLET BY MOUTH DAILY 90 tablet 3   NORVASC 5 MG tablet NEW PRESCRIPTION REQUEST: TAKE ONE TABLET BY MOUTH DAILY 90 tablet 3   potassium chloride SA (KLOR-CON M) 20 MEQ tablet Take 1 tablet (20 mEq total) by mouth daily. 30 tablet 5    Pramoxine-HC (HYDROCORTISONE ACE-PRAMOXINE) 2.5-1 % CREA Place rectally.     PROVENTIL HFA 108 (90 Base) MCG/ACT inhaler INHALE 1-2 PUFFS INTO THE LUNGS EVERY 6 (SIX) HOURS AS NEEDED FOR WHEEZING. 6.7 Inhaler 7   telmisartan (MICARDIS) 80 MG tablet TAKE 1 TABLET BY MOUTH EVERY DAY 90 tablet 1   traMADol (ULTRAM) 50 MG tablet Take 1 tablet (50 mg total) by mouth every 8 (eight) hours. 90 tablet 2   UNABLE TO FIND at bedtime. Osteo-ease     vitamin E 200 UNIT capsule Take 200 Units by mouth daily.     zoledronic acid (RECLAST) 5 MG/100ML SOLN injection See admin instructions.     No current facility-administered medications on file prior to visit.     Review of Systems     Objective:   Vitals:   08/02/22 1325  BP: 128/76  Pulse: 70  Temp: 98 F (36.7 C)  SpO2: 92%   BP Readings from Last 3 Encounters:  08/02/22 128/76  07/26/22 132/80  05/24/22 132/72   Wt Readings from Last 3 Encounters:  05/24/22 142 lb (64.4 kg)  05/24/22 142 lb (64.4 kg)  11/18/21 141 lb (64 kg)   Body mass index is 28.68 kg/m.    Physical Exam Constitutional:  General: She is not in acute distress.    Appearance: Normal appearance. She is not ill-appearing.  HENT:     Head: Normocephalic and atraumatic.  Cardiovascular:     Rate and Rhythm: Normal rate and regular rhythm.     Heart sounds: Murmur (2/6 systolic) heard.  Pulmonary:     Effort: Pulmonary effort is normal. No respiratory distress.     Breath sounds: No wheezing or rales.  Musculoskeletal:     Right lower leg: Edema (1 + pitting edema 1/3 Profeta up leg - slightly less than LLE) present.     Left lower leg: Edema (edema worse than RLE - 1 + pitting edema 1/2 Carlini up leg) present.  Skin:    General: Skin is warm and dry.     Findings: No erythema or rash.  Neurological:     Mental Status: She is alert.        Lab Results  Component Value Date   WBC 6.1 11/18/2021   HGB 12.5 11/18/2021   HCT 38.2 11/18/2021   PLT 237.0  11/18/2021   GLUCOSE 85 07/26/2022   CHOL 172 11/18/2021   TRIG 64.0 11/18/2021   HDL 80.20 11/18/2021   LDLCALC 79 11/18/2021   ALT 10 07/26/2022   AST 22 07/26/2022   NA 132 (L) 07/26/2022   K 4.7 07/26/2022   CL 94 (L) 07/26/2022   CREATININE 1.03 07/26/2022   BUN 33 (H) 07/26/2022   CO2 32 07/26/2022   TSH 3.26 05/20/2020   HGBA1C 5.7 11/18/2021     Assessment & Plan:    See Problem List for Assessment and Plan of chronic medical problems.

## 2022-08-02 NOTE — Patient Instructions (Addendum)
      Blood work was ordered.   The lab is on the first floor.    Medications changes include :   none     Return for follow up as scheduled.  

## 2022-08-03 DIAGNOSIS — G8929 Other chronic pain: Secondary | ICD-10-CM | POA: Diagnosis not present

## 2022-08-03 DIAGNOSIS — R7303 Prediabetes: Secondary | ICD-10-CM | POA: Diagnosis not present

## 2022-08-03 DIAGNOSIS — J45909 Unspecified asthma, uncomplicated: Secondary | ICD-10-CM | POA: Diagnosis not present

## 2022-08-03 DIAGNOSIS — M48061 Spinal stenosis, lumbar region without neurogenic claudication: Secondary | ICD-10-CM | POA: Diagnosis not present

## 2022-08-03 DIAGNOSIS — I119 Hypertensive heart disease without heart failure: Secondary | ICD-10-CM | POA: Diagnosis not present

## 2022-08-03 DIAGNOSIS — M150 Primary generalized (osteo)arthritis: Secondary | ICD-10-CM | POA: Diagnosis not present

## 2022-08-04 ENCOUNTER — Ambulatory Visit: Payer: Medicare Other | Admitting: Internal Medicine

## 2022-08-04 ENCOUNTER — Encounter: Payer: Self-pay | Admitting: Podiatry

## 2022-08-04 ENCOUNTER — Ambulatory Visit: Payer: Medicare Other | Admitting: Podiatry

## 2022-08-04 VITALS — BP 114/67 | HR 72

## 2022-08-04 DIAGNOSIS — B351 Tinea unguium: Secondary | ICD-10-CM

## 2022-08-04 DIAGNOSIS — M79674 Pain in right toe(s): Secondary | ICD-10-CM

## 2022-08-04 DIAGNOSIS — M79675 Pain in left toe(s): Secondary | ICD-10-CM | POA: Diagnosis not present

## 2022-08-04 DIAGNOSIS — R600 Localized edema: Secondary | ICD-10-CM | POA: Diagnosis not present

## 2022-08-04 NOTE — Progress Notes (Signed)
   Chief Complaint  Patient presents with   Nail Problem    "I have weeping in my feet and I need my nails clipped."     SUBJECTIVE Patient presents to office today with her son complaining of elongated, thickened nails that cause pain while ambulating in shoes.  Patient is unable to trim their own nails.  Patient has also been experiencing increased lower extremity edema bilateral.  Patient is here for further evaluation and treatment.  Past Medical History:  Diagnosis Date   ALLERGIC RHINITIS    Anemia    Asthma    Asthma    Cataract    GERD (gastroesophageal reflux disease)    with HH   History of renal calculi    Hypertension    Hypothyroidism    Osteoarthritis    Parotid tumor    L side, s/p resection 1980 and 2007    Allergies  Allergen Reactions   Contrast Media [Iodinated Contrast Media] Other (See Comments)   Dyphylline-Guaifenesin    Erythromycin Nausea Only   Gabapentin     Affected memory   Mevacor [Lovastatin]     myalgias   Other     CT scan dye     OBJECTIVE General Patient is awake, alert, and oriented x 3 and in no acute distress. Derm Skin is dry and supple bilateral. Negative open lesions or macerations. Remaining integument unremarkable. Nails are tender, long, thickened and dystrophic with subungual debris, consistent with onychomycosis, 1-5 bilateral. No signs of infection noted. Vasc moderate edema bilateral lower extremities.  Neuro light touch and protective threshold sensation grossly intact bilaterally.  Musculoskeletal Exam hallux valgus deformity bilateral.  Patient minimally ambulatory  ASSESSMENT 1.  Pain due to onychomycosis of toenails both 2.  Edema bilateral lower extremities  PLAN OF CARE 1. Patient evaluated today.  2. Instructed to maintain good pedal hygiene and foot care.  3. Mechanical debridement of nails 1-5 bilaterally performed using a nail nipper. Filed with dremel without incident.  4.  Recommend below-knee  compression socks daily.  Patient has a pair of compression hose that she cannot wear.  Recommend starting with light compression 8-15mmHg 5.  Continue diuretic from PCP  6.  Return to clinic in 3 mos.    Felecia Shelling, DPM [facility]  Dr. Felecia Shelling, DPM    [address] [place], [place] [address]                Office [phone]  Fax [phone]

## 2022-08-11 DIAGNOSIS — M150 Primary generalized (osteo)arthritis: Secondary | ICD-10-CM | POA: Diagnosis not present

## 2022-08-11 DIAGNOSIS — G8929 Other chronic pain: Secondary | ICD-10-CM | POA: Diagnosis not present

## 2022-08-11 DIAGNOSIS — J45909 Unspecified asthma, uncomplicated: Secondary | ICD-10-CM | POA: Diagnosis not present

## 2022-08-11 DIAGNOSIS — I119 Hypertensive heart disease without heart failure: Secondary | ICD-10-CM | POA: Diagnosis not present

## 2022-08-11 DIAGNOSIS — M48061 Spinal stenosis, lumbar region without neurogenic claudication: Secondary | ICD-10-CM | POA: Diagnosis not present

## 2022-08-11 DIAGNOSIS — R7303 Prediabetes: Secondary | ICD-10-CM | POA: Diagnosis not present

## 2022-08-12 DIAGNOSIS — J45909 Unspecified asthma, uncomplicated: Secondary | ICD-10-CM | POA: Diagnosis not present

## 2022-08-12 DIAGNOSIS — I119 Hypertensive heart disease without heart failure: Secondary | ICD-10-CM | POA: Diagnosis not present

## 2022-08-12 DIAGNOSIS — M150 Primary generalized (osteo)arthritis: Secondary | ICD-10-CM | POA: Diagnosis not present

## 2022-08-12 DIAGNOSIS — M48061 Spinal stenosis, lumbar region without neurogenic claudication: Secondary | ICD-10-CM | POA: Diagnosis not present

## 2022-08-12 DIAGNOSIS — G8929 Other chronic pain: Secondary | ICD-10-CM | POA: Diagnosis not present

## 2022-08-12 DIAGNOSIS — R7303 Prediabetes: Secondary | ICD-10-CM | POA: Diagnosis not present

## 2022-08-17 DIAGNOSIS — R7303 Prediabetes: Secondary | ICD-10-CM | POA: Diagnosis not present

## 2022-08-17 DIAGNOSIS — M150 Primary generalized (osteo)arthritis: Secondary | ICD-10-CM | POA: Diagnosis not present

## 2022-08-17 DIAGNOSIS — G8929 Other chronic pain: Secondary | ICD-10-CM | POA: Diagnosis not present

## 2022-08-17 DIAGNOSIS — M48061 Spinal stenosis, lumbar region without neurogenic claudication: Secondary | ICD-10-CM | POA: Diagnosis not present

## 2022-08-17 DIAGNOSIS — J45909 Unspecified asthma, uncomplicated: Secondary | ICD-10-CM | POA: Diagnosis not present

## 2022-08-17 DIAGNOSIS — I119 Hypertensive heart disease without heart failure: Secondary | ICD-10-CM | POA: Diagnosis not present

## 2022-08-19 ENCOUNTER — Other Ambulatory Visit: Payer: Self-pay | Admitting: Internal Medicine

## 2022-08-19 DIAGNOSIS — J45909 Unspecified asthma, uncomplicated: Secondary | ICD-10-CM | POA: Diagnosis not present

## 2022-08-19 DIAGNOSIS — I119 Hypertensive heart disease without heart failure: Secondary | ICD-10-CM | POA: Diagnosis not present

## 2022-08-19 DIAGNOSIS — M150 Primary generalized (osteo)arthritis: Secondary | ICD-10-CM | POA: Diagnosis not present

## 2022-08-19 DIAGNOSIS — R7303 Prediabetes: Secondary | ICD-10-CM | POA: Diagnosis not present

## 2022-08-19 DIAGNOSIS — M48061 Spinal stenosis, lumbar region without neurogenic claudication: Secondary | ICD-10-CM | POA: Diagnosis not present

## 2022-08-19 DIAGNOSIS — G8929 Other chronic pain: Secondary | ICD-10-CM | POA: Diagnosis not present

## 2022-08-24 DIAGNOSIS — M810 Age-related osteoporosis without current pathological fracture: Secondary | ICD-10-CM | POA: Diagnosis not present

## 2022-08-26 ENCOUNTER — Telehealth: Payer: Self-pay | Admitting: Internal Medicine

## 2022-08-26 ENCOUNTER — Other Ambulatory Visit: Payer: Self-pay

## 2022-08-26 MED ORDER — FUROSEMIDE 40 MG PO TABS: 40.0000 mg | ORAL_TABLET | Freq: Every day | ORAL | 3 refills | Status: DC

## 2022-08-26 NOTE — Telephone Encounter (Signed)
Patient called and would like her tramadol changed to just once a day.  Please change the Etodolac sent to [facility] so everything comes to her house.    Please also send the 40 mg. Lasix to [facility].

## 2022-08-26 NOTE — Telephone Encounter (Signed)
Called patient and left message for her to return call to clinic.  Need to get clarification on if she is only taking one Tramadol a day and asking for the instructions and quantity to be switched.

## 2022-08-27 DIAGNOSIS — M810 Age-related osteoporosis without current pathological fracture: Secondary | ICD-10-CM | POA: Diagnosis not present

## 2022-08-27 DIAGNOSIS — D490 Neoplasm of unspecified behavior of digestive system: Secondary | ICD-10-CM | POA: Diagnosis not present

## 2022-08-27 DIAGNOSIS — I119 Hypertensive heart disease without heart failure: Secondary | ICD-10-CM | POA: Diagnosis not present

## 2022-08-27 DIAGNOSIS — K5792 Diverticulitis of intestine, part unspecified, without perforation or abscess without bleeding: Secondary | ICD-10-CM | POA: Diagnosis not present

## 2022-08-27 DIAGNOSIS — M48061 Spinal stenosis, lumbar region without neurogenic claudication: Secondary | ICD-10-CM | POA: Diagnosis not present

## 2022-08-27 DIAGNOSIS — K449 Diaphragmatic hernia without obstruction or gangrene: Secondary | ICD-10-CM | POA: Diagnosis not present

## 2022-08-27 DIAGNOSIS — H409 Unspecified glaucoma: Secondary | ICD-10-CM | POA: Diagnosis not present

## 2022-08-27 DIAGNOSIS — E785 Hyperlipidemia, unspecified: Secondary | ICD-10-CM | POA: Diagnosis not present

## 2022-08-27 DIAGNOSIS — D649 Anemia, unspecified: Secondary | ICD-10-CM | POA: Diagnosis not present

## 2022-08-27 DIAGNOSIS — R7303 Prediabetes: Secondary | ICD-10-CM | POA: Diagnosis not present

## 2022-08-27 DIAGNOSIS — G5601 Carpal tunnel syndrome, right upper limb: Secondary | ICD-10-CM | POA: Diagnosis not present

## 2022-08-27 DIAGNOSIS — K219 Gastro-esophageal reflux disease without esophagitis: Secondary | ICD-10-CM | POA: Diagnosis not present

## 2022-08-27 DIAGNOSIS — Z85828 Personal history of other malignant neoplasm of skin: Secondary | ICD-10-CM | POA: Diagnosis not present

## 2022-08-27 DIAGNOSIS — Z87891 Personal history of nicotine dependence: Secondary | ICD-10-CM | POA: Diagnosis not present

## 2022-08-27 DIAGNOSIS — G8929 Other chronic pain: Secondary | ICD-10-CM | POA: Diagnosis not present

## 2022-08-27 DIAGNOSIS — M150 Primary generalized (osteo)arthritis: Secondary | ICD-10-CM | POA: Diagnosis not present

## 2022-08-27 DIAGNOSIS — J45909 Unspecified asthma, uncomplicated: Secondary | ICD-10-CM | POA: Diagnosis not present

## 2022-08-27 DIAGNOSIS — Z96653 Presence of artificial knee joint, bilateral: Secondary | ICD-10-CM | POA: Diagnosis not present

## 2022-08-27 DIAGNOSIS — B351 Tinea unguium: Secondary | ICD-10-CM | POA: Diagnosis not present

## 2022-08-27 DIAGNOSIS — G508 Other disorders of trigeminal nerve: Secondary | ICD-10-CM | POA: Diagnosis not present

## 2022-08-27 DIAGNOSIS — Z9181 History of falling: Secondary | ICD-10-CM | POA: Diagnosis not present

## 2022-08-27 DIAGNOSIS — E213 Hyperparathyroidism, unspecified: Secondary | ICD-10-CM | POA: Diagnosis not present

## 2022-08-30 ENCOUNTER — Telehealth: Payer: Self-pay | Admitting: Internal Medicine

## 2022-08-30 DIAGNOSIS — I119 Hypertensive heart disease without heart failure: Secondary | ICD-10-CM | POA: Diagnosis not present

## 2022-08-30 DIAGNOSIS — G8929 Other chronic pain: Secondary | ICD-10-CM | POA: Diagnosis not present

## 2022-08-30 DIAGNOSIS — R7303 Prediabetes: Secondary | ICD-10-CM | POA: Diagnosis not present

## 2022-08-30 DIAGNOSIS — M150 Primary generalized (osteo)arthritis: Secondary | ICD-10-CM | POA: Diagnosis not present

## 2022-08-30 DIAGNOSIS — M48061 Spinal stenosis, lumbar region without neurogenic claudication: Secondary | ICD-10-CM | POA: Diagnosis not present

## 2022-08-30 DIAGNOSIS — J45909 Unspecified asthma, uncomplicated: Secondary | ICD-10-CM | POA: Diagnosis not present

## 2022-08-30 NOTE — Telephone Encounter (Signed)
Tiffany from [facility] call stating when receiving fax  from [place] they only received the cover page the order number [phone].  Best call back number is [phone] Ext [phone]

## 2022-09-03 NOTE — Telephone Encounter (Signed)
Called Tiffany back no answer LMOM form has not been scanned in chart. Will have to fax form again.Marland KitchenRaechel Chute

## 2022-09-14 ENCOUNTER — Other Ambulatory Visit: Payer: Self-pay | Admitting: Internal Medicine

## 2022-09-15 ENCOUNTER — Other Ambulatory Visit: Payer: Self-pay | Admitting: Internal Medicine

## 2022-09-15 DIAGNOSIS — I119 Hypertensive heart disease without heart failure: Secondary | ICD-10-CM | POA: Diagnosis not present

## 2022-09-15 DIAGNOSIS — R7303 Prediabetes: Secondary | ICD-10-CM | POA: Diagnosis not present

## 2022-09-15 DIAGNOSIS — J45909 Unspecified asthma, uncomplicated: Secondary | ICD-10-CM | POA: Diagnosis not present

## 2022-09-15 DIAGNOSIS — G8929 Other chronic pain: Secondary | ICD-10-CM | POA: Diagnosis not present

## 2022-09-15 DIAGNOSIS — M48061 Spinal stenosis, lumbar region without neurogenic claudication: Secondary | ICD-10-CM | POA: Diagnosis not present

## 2022-09-15 DIAGNOSIS — M150 Primary generalized (osteo)arthritis: Secondary | ICD-10-CM | POA: Diagnosis not present

## 2022-09-23 ENCOUNTER — Other Ambulatory Visit: Payer: Self-pay

## 2022-09-23 ENCOUNTER — Telehealth: Payer: Self-pay | Admitting: Internal Medicine

## 2022-09-23 DIAGNOSIS — G8929 Other chronic pain: Secondary | ICD-10-CM | POA: Diagnosis not present

## 2022-09-23 DIAGNOSIS — R7303 Prediabetes: Secondary | ICD-10-CM | POA: Diagnosis not present

## 2022-09-23 DIAGNOSIS — M48061 Spinal stenosis, lumbar region without neurogenic claudication: Secondary | ICD-10-CM | POA: Diagnosis not present

## 2022-09-23 DIAGNOSIS — I119 Hypertensive heart disease without heart failure: Secondary | ICD-10-CM | POA: Diagnosis not present

## 2022-09-23 DIAGNOSIS — M150 Primary generalized (osteo)arthritis: Secondary | ICD-10-CM | POA: Diagnosis not present

## 2022-09-23 DIAGNOSIS — J45909 Unspecified asthma, uncomplicated: Secondary | ICD-10-CM | POA: Diagnosis not present

## 2022-09-23 MED ORDER — PROVENTIL HFA 108 (90 BASE) MCG/ACT IN AERS: INHALATION_SPRAY | RESPIRATORY_TRACT | 7 refills | Status: DC

## 2022-09-23 NOTE — Telephone Encounter (Signed)
Inhaler sent in for patient today with refills.

## 2022-09-23 NOTE — Telephone Encounter (Signed)
Kendra physical therapist called to verify that PROVENTIL HFA 108 (90 Base) MCG/ACT inhaler, was still an active medicine for the patient. Asked if an order could be sent in to patient pharmacy for it as she has some wheezing. If any questions states to contact the patient at [phone].

## 2022-09-26 DIAGNOSIS — J45909 Unspecified asthma, uncomplicated: Secondary | ICD-10-CM | POA: Diagnosis not present

## 2022-09-26 DIAGNOSIS — Z87891 Personal history of nicotine dependence: Secondary | ICD-10-CM | POA: Diagnosis not present

## 2022-09-26 DIAGNOSIS — M810 Age-related osteoporosis without current pathological fracture: Secondary | ICD-10-CM | POA: Diagnosis not present

## 2022-09-26 DIAGNOSIS — Z85828 Personal history of other malignant neoplasm of skin: Secondary | ICD-10-CM | POA: Diagnosis not present

## 2022-09-26 DIAGNOSIS — D649 Anemia, unspecified: Secondary | ICD-10-CM | POA: Diagnosis not present

## 2022-09-26 DIAGNOSIS — B351 Tinea unguium: Secondary | ICD-10-CM | POA: Diagnosis not present

## 2022-09-26 DIAGNOSIS — I119 Hypertensive heart disease without heart failure: Secondary | ICD-10-CM | POA: Diagnosis not present

## 2022-09-26 DIAGNOSIS — Z791 Long term (current) use of non-steroidal anti-inflammatories (NSAID): Secondary | ICD-10-CM | POA: Diagnosis not present

## 2022-09-26 DIAGNOSIS — K5792 Diverticulitis of intestine, part unspecified, without perforation or abscess without bleeding: Secondary | ICD-10-CM | POA: Diagnosis not present

## 2022-09-26 DIAGNOSIS — Z9181 History of falling: Secondary | ICD-10-CM | POA: Diagnosis not present

## 2022-09-26 DIAGNOSIS — D490 Neoplasm of unspecified behavior of digestive system: Secondary | ICD-10-CM | POA: Diagnosis not present

## 2022-09-26 DIAGNOSIS — K219 Gastro-esophageal reflux disease without esophagitis: Secondary | ICD-10-CM | POA: Diagnosis not present

## 2022-09-26 DIAGNOSIS — M48061 Spinal stenosis, lumbar region without neurogenic claudication: Secondary | ICD-10-CM | POA: Diagnosis not present

## 2022-09-26 DIAGNOSIS — K449 Diaphragmatic hernia without obstruction or gangrene: Secondary | ICD-10-CM | POA: Diagnosis not present

## 2022-09-26 DIAGNOSIS — E039 Hypothyroidism, unspecified: Secondary | ICD-10-CM | POA: Diagnosis not present

## 2022-09-26 DIAGNOSIS — G5601 Carpal tunnel syndrome, right upper limb: Secondary | ICD-10-CM | POA: Diagnosis not present

## 2022-09-26 DIAGNOSIS — E785 Hyperlipidemia, unspecified: Secondary | ICD-10-CM | POA: Diagnosis not present

## 2022-09-26 DIAGNOSIS — G8929 Other chronic pain: Secondary | ICD-10-CM | POA: Diagnosis not present

## 2022-09-26 DIAGNOSIS — Z602 Problems related to living alone: Secondary | ICD-10-CM | POA: Diagnosis not present

## 2022-09-26 DIAGNOSIS — G508 Other disorders of trigeminal nerve: Secondary | ICD-10-CM | POA: Diagnosis not present

## 2022-09-26 DIAGNOSIS — Z96653 Presence of artificial knee joint, bilateral: Secondary | ICD-10-CM | POA: Diagnosis not present

## 2022-09-26 DIAGNOSIS — M150 Primary generalized (osteo)arthritis: Secondary | ICD-10-CM | POA: Diagnosis not present

## 2022-09-26 DIAGNOSIS — R7303 Prediabetes: Secondary | ICD-10-CM | POA: Diagnosis not present

## 2022-09-26 DIAGNOSIS — H409 Unspecified glaucoma: Secondary | ICD-10-CM | POA: Diagnosis not present

## 2022-09-29 DIAGNOSIS — M48061 Spinal stenosis, lumbar region without neurogenic claudication: Secondary | ICD-10-CM

## 2022-09-29 DIAGNOSIS — G5601 Carpal tunnel syndrome, right upper limb: Secondary | ICD-10-CM

## 2022-09-29 DIAGNOSIS — G8929 Other chronic pain: Secondary | ICD-10-CM

## 2022-09-29 DIAGNOSIS — R7303 Prediabetes: Secondary | ICD-10-CM

## 2022-09-29 DIAGNOSIS — I119 Hypertensive heart disease without heart failure: Secondary | ICD-10-CM

## 2022-09-29 DIAGNOSIS — G508 Other disorders of trigeminal nerve: Secondary | ICD-10-CM

## 2022-09-29 DIAGNOSIS — H409 Unspecified glaucoma: Secondary | ICD-10-CM

## 2022-09-29 DIAGNOSIS — M810 Age-related osteoporosis without current pathological fracture: Secondary | ICD-10-CM

## 2022-09-29 DIAGNOSIS — J45909 Unspecified asthma, uncomplicated: Secondary | ICD-10-CM

## 2022-09-29 DIAGNOSIS — K449 Diaphragmatic hernia without obstruction or gangrene: Secondary | ICD-10-CM

## 2022-09-29 DIAGNOSIS — K5792 Diverticulitis of intestine, part unspecified, without perforation or abscess without bleeding: Secondary | ICD-10-CM

## 2022-09-29 DIAGNOSIS — M150 Primary generalized (osteo)arthritis: Secondary | ICD-10-CM

## 2022-10-05 DIAGNOSIS — R7303 Prediabetes: Secondary | ICD-10-CM | POA: Diagnosis not present

## 2022-10-05 DIAGNOSIS — I119 Hypertensive heart disease without heart failure: Secondary | ICD-10-CM | POA: Diagnosis not present

## 2022-10-05 DIAGNOSIS — G8929 Other chronic pain: Secondary | ICD-10-CM | POA: Diagnosis not present

## 2022-10-05 DIAGNOSIS — J45909 Unspecified asthma, uncomplicated: Secondary | ICD-10-CM | POA: Diagnosis not present

## 2022-10-05 DIAGNOSIS — M150 Primary generalized (osteo)arthritis: Secondary | ICD-10-CM | POA: Diagnosis not present

## 2022-10-05 DIAGNOSIS — M48061 Spinal stenosis, lumbar region without neurogenic claudication: Secondary | ICD-10-CM | POA: Diagnosis not present

## 2022-10-12 ENCOUNTER — Other Ambulatory Visit: Payer: Self-pay | Admitting: Internal Medicine

## 2022-10-19 DIAGNOSIS — G8929 Other chronic pain: Secondary | ICD-10-CM | POA: Diagnosis not present

## 2022-10-19 DIAGNOSIS — R7303 Prediabetes: Secondary | ICD-10-CM | POA: Diagnosis not present

## 2022-10-19 DIAGNOSIS — M150 Primary generalized (osteo)arthritis: Secondary | ICD-10-CM | POA: Diagnosis not present

## 2022-10-19 DIAGNOSIS — I119 Hypertensive heart disease without heart failure: Secondary | ICD-10-CM | POA: Diagnosis not present

## 2022-10-19 DIAGNOSIS — M48061 Spinal stenosis, lumbar region without neurogenic claudication: Secondary | ICD-10-CM | POA: Diagnosis not present

## 2022-10-19 DIAGNOSIS — J45909 Unspecified asthma, uncomplicated: Secondary | ICD-10-CM | POA: Diagnosis not present

## 2022-10-20 ENCOUNTER — Other Ambulatory Visit: Payer: Self-pay | Admitting: Internal Medicine

## 2022-10-25 ENCOUNTER — Other Ambulatory Visit: Payer: Self-pay | Admitting: Internal Medicine

## 2022-10-26 DIAGNOSIS — Z602 Problems related to living alone: Secondary | ICD-10-CM | POA: Diagnosis not present

## 2022-10-26 DIAGNOSIS — Z9181 History of falling: Secondary | ICD-10-CM | POA: Diagnosis not present

## 2022-10-26 DIAGNOSIS — E785 Hyperlipidemia, unspecified: Secondary | ICD-10-CM | POA: Diagnosis not present

## 2022-10-26 DIAGNOSIS — G8929 Other chronic pain: Secondary | ICD-10-CM | POA: Diagnosis not present

## 2022-10-26 DIAGNOSIS — Z791 Long term (current) use of non-steroidal anti-inflammatories (NSAID): Secondary | ICD-10-CM | POA: Diagnosis not present

## 2022-10-26 DIAGNOSIS — I119 Hypertensive heart disease without heart failure: Secondary | ICD-10-CM | POA: Diagnosis not present

## 2022-10-26 DIAGNOSIS — Z85828 Personal history of other malignant neoplasm of skin: Secondary | ICD-10-CM | POA: Diagnosis not present

## 2022-10-26 DIAGNOSIS — K219 Gastro-esophageal reflux disease without esophagitis: Secondary | ICD-10-CM | POA: Diagnosis not present

## 2022-10-26 DIAGNOSIS — K449 Diaphragmatic hernia without obstruction or gangrene: Secondary | ICD-10-CM | POA: Diagnosis not present

## 2022-10-26 DIAGNOSIS — J45909 Unspecified asthma, uncomplicated: Secondary | ICD-10-CM | POA: Diagnosis not present

## 2022-10-26 DIAGNOSIS — K5792 Diverticulitis of intestine, part unspecified, without perforation or abscess without bleeding: Secondary | ICD-10-CM | POA: Diagnosis not present

## 2022-10-26 DIAGNOSIS — G5601 Carpal tunnel syndrome, right upper limb: Secondary | ICD-10-CM | POA: Diagnosis not present

## 2022-10-26 DIAGNOSIS — B351 Tinea unguium: Secondary | ICD-10-CM | POA: Diagnosis not present

## 2022-10-26 DIAGNOSIS — E039 Hypothyroidism, unspecified: Secondary | ICD-10-CM | POA: Diagnosis not present

## 2022-10-26 DIAGNOSIS — G508 Other disorders of trigeminal nerve: Secondary | ICD-10-CM | POA: Diagnosis not present

## 2022-10-26 DIAGNOSIS — M48061 Spinal stenosis, lumbar region without neurogenic claudication: Secondary | ICD-10-CM | POA: Diagnosis not present

## 2022-10-26 DIAGNOSIS — H409 Unspecified glaucoma: Secondary | ICD-10-CM | POA: Diagnosis not present

## 2022-10-26 DIAGNOSIS — D649 Anemia, unspecified: Secondary | ICD-10-CM | POA: Diagnosis not present

## 2022-10-26 DIAGNOSIS — Z87891 Personal history of nicotine dependence: Secondary | ICD-10-CM | POA: Diagnosis not present

## 2022-10-26 DIAGNOSIS — M150 Primary generalized (osteo)arthritis: Secondary | ICD-10-CM | POA: Diagnosis not present

## 2022-10-26 DIAGNOSIS — M810 Age-related osteoporosis without current pathological fracture: Secondary | ICD-10-CM | POA: Diagnosis not present

## 2022-10-26 DIAGNOSIS — D490 Neoplasm of unspecified behavior of digestive system: Secondary | ICD-10-CM | POA: Diagnosis not present

## 2022-10-26 DIAGNOSIS — R7303 Prediabetes: Secondary | ICD-10-CM | POA: Diagnosis not present

## 2022-10-26 DIAGNOSIS — Z96653 Presence of artificial knee joint, bilateral: Secondary | ICD-10-CM | POA: Diagnosis not present

## 2022-11-03 DIAGNOSIS — J45909 Unspecified asthma, uncomplicated: Secondary | ICD-10-CM | POA: Diagnosis not present

## 2022-11-03 DIAGNOSIS — R7303 Prediabetes: Secondary | ICD-10-CM | POA: Diagnosis not present

## 2022-11-03 DIAGNOSIS — I119 Hypertensive heart disease without heart failure: Secondary | ICD-10-CM | POA: Diagnosis not present

## 2022-11-03 DIAGNOSIS — G8929 Other chronic pain: Secondary | ICD-10-CM | POA: Diagnosis not present

## 2022-11-03 DIAGNOSIS — M150 Primary generalized (osteo)arthritis: Secondary | ICD-10-CM | POA: Diagnosis not present

## 2022-11-03 DIAGNOSIS — M48061 Spinal stenosis, lumbar region without neurogenic claudication: Secondary | ICD-10-CM | POA: Diagnosis not present

## 2022-11-04 ENCOUNTER — Encounter: Payer: Self-pay | Admitting: Podiatry

## 2022-11-04 ENCOUNTER — Ambulatory Visit: Payer: Medicare Other | Admitting: Podiatry

## 2022-11-04 DIAGNOSIS — M79674 Pain in right toe(s): Secondary | ICD-10-CM | POA: Diagnosis not present

## 2022-11-04 DIAGNOSIS — B351 Tinea unguium: Secondary | ICD-10-CM | POA: Diagnosis not present

## 2022-11-04 DIAGNOSIS — M2011 Hallux valgus (acquired), right foot: Secondary | ICD-10-CM

## 2022-11-04 DIAGNOSIS — M2012 Hallux valgus (acquired), left foot: Secondary | ICD-10-CM

## 2022-11-04 DIAGNOSIS — M79675 Pain in left toe(s): Secondary | ICD-10-CM

## 2022-11-04 NOTE — Progress Notes (Signed)
This patient presents to the office with chief complaint of long thick painful nails.  Patient says the nails are painful walking and wearing shoes.  This patient is unable to self treat.  This patient is unable to trim her nails since she is unable to reach her nails.  She presents to the office for preventative foot care services.  General Appearance  Alert, conversant and in no acute stress.  Vascular  Dorsalis pedis and posterior tibial  pulses are  weakly palpable  bilaterally.  Capillary return is within normal limits  bilaterally. Temperature is within normal limits  bilaterally.  Swelling feet  B/L.  Neurologic  Senn-Weinstein monofilament wire test within normal limits  bilaterally. Muscle power within normal limits bilaterally.  Nails Thick disfigured discolored nails with subungual debris  from hallux to fifth toes bilaterally. No evidence of bacterial infection or drainage bilaterally.  Orthopedic  No limitations of motion  feet .  No crepitus or effusions noted.  No bony pathology or digital deformities noted. Severe HAV  B/L.  Contracted digits  B/L.  Skin  normotropic skin with no porokeratosis noted bilaterally.  No signs of infections or ulcers noted.     Onychomycosis  Nails  B/L.  Pain in right toes  Pain in left toes  Debridement of nails both feet followed trimming the nails with dremel tool.    RTC 3 months.   Helane Gunther DPM

## 2022-11-16 DIAGNOSIS — G8929 Other chronic pain: Secondary | ICD-10-CM | POA: Diagnosis not present

## 2022-11-16 DIAGNOSIS — R7303 Prediabetes: Secondary | ICD-10-CM | POA: Diagnosis not present

## 2022-11-16 DIAGNOSIS — J45909 Unspecified asthma, uncomplicated: Secondary | ICD-10-CM | POA: Diagnosis not present

## 2022-11-16 DIAGNOSIS — I119 Hypertensive heart disease without heart failure: Secondary | ICD-10-CM | POA: Diagnosis not present

## 2022-11-16 DIAGNOSIS — M150 Primary generalized (osteo)arthritis: Secondary | ICD-10-CM | POA: Diagnosis not present

## 2022-11-16 DIAGNOSIS — M48061 Spinal stenosis, lumbar region without neurogenic claudication: Secondary | ICD-10-CM | POA: Diagnosis not present

## 2022-11-18 ENCOUNTER — Other Ambulatory Visit: Payer: Self-pay | Admitting: Internal Medicine

## 2022-11-21 DIAGNOSIS — N1832 Chronic kidney disease, stage 3b: Secondary | ICD-10-CM | POA: Insufficient documentation

## 2022-11-21 NOTE — Progress Notes (Unsigned)
Subjective:    Patient ID: Anna Adams, female    DOB: [birth year], 88 y.o.   MRN: [id number]     HPI Anna Adams is here for follow up of her chronic medical problems.  Ckd new-? NSAID related  Has intermittent nausea - not related to eating.  No gerd.  Drinks sprite, eats a lot of cheese and potato chips.   Usually elevates legs.    Medications and allergies reviewed with patient and updated if appropriate.  Current Outpatient Medications on File Prior to Visit  Medication Sig Dispense Refill   atorvastatin (LIPITOR) 20 MG tablet NEW PRESCRIPTION REQUEST: TAKE ONE TABLET BY MOUTH DAILY 90 tablet 3   b complex vitamins tablet Take 1 tablet by mouth daily.     Biotin 46962 MCG TABS See admin instructions.     cholecalciferol (VITAMIN D) 1000 UNITS tablet Take 400 Units by mouth daily.     etodolac (LODINE XL) 400 MG 24 hr tablet TAKE 1 TABLET BY MOUTH EVERY DAY 90 tablet 0   furosemide (LASIX) 40 MG tablet TAKE ONE TABLET (40MG  TOTAL) BY MOUTH IN THE MORNING (Patient taking differently: Patient takes 40 mg and then an additional 20 mg daily) 30 tablet 10   GNP ACID REDUCER MAXIMUM ST 20 MG tablet NEW PRESCRIPTION REQUEST: TAKE ONE TABLET BY MOUTH DAILY 90 tablet 3   hydrocortisone-pramoxine (ANALPRAM HC) 2.5-1 % rectal cream Place 1 application rectally 3 (three) times daily. 30 g 0   latanoprost (XALATAN) 0.005 % ophthalmic solution      levothyroxine (SYNTHROID) 125 MCG tablet NEW PRESCRIPTION REQUEST: TAKE ONE TABLET BY MOUTH DAILY 90 tablet 3   NORVASC 5 MG tablet NEW PRESCRIPTION REQUEST: TAKE ONE TABLET BY MOUTH DAILY 90 tablet 3   potassium chloride SA (KLOR-CON M) 20 MEQ tablet Take 1 tablet (20 mEq total) by mouth daily. 30 tablet 5   Pramoxine-HC (HYDROCORTISONE ACE-PRAMOXINE) 2.5-1 % CREA Place rectally.     PROVENTIL HFA 108 (90 Base) MCG/ACT inhaler INHALE 1-2 PUFFS INTO THE LUNGS EVERY 6 (SIX) HOURS AS NEEDED FOR WHEEZING. 6.7 each 7   telmisartan (MICARDIS) 80 MG  tablet TAKE ONE TABLET BY MOUTH DAILY 30 tablet 10   traMADol (ULTRAM) 50 MG tablet TAKE 1 TABLET BY MOUTH EVERY 8 HOURS 90 tablet 0   UNABLE TO FIND at bedtime. Osteo-ease     vitamin E 200 UNIT capsule Take 200 Units by mouth daily.     zoledronic acid (RECLAST) 5 MG/100ML SOLN injection See admin instructions.     No current facility-administered medications on file prior to visit.     Review of Systems  Constitutional:  Negative for fever.  Respiratory:  Positive for shortness of breath (chronic - no change). Negative for cough and wheezing.   Cardiovascular:  Positive for leg swelling (chronic - ? little more). Negative for chest pain and palpitations.  Gastrointestinal:  Positive for nausea. Negative for abdominal pain, constipation, diarrhea and vomiting.       Chronic regurgitation - denies gerd  Musculoskeletal:  Positive for back pain.  Neurological:  Negative for light-headedness and headaches.       Objective:   Vitals:   11/22/22 1323  BP: 108/74  Pulse: 67  Temp: 98 F (36.7 C)  SpO2: 92%   BP Readings from Last 3 Encounters:  11/22/22 108/74  08/04/22 114/67  08/02/22 128/76   Wt Readings from Last 3 Encounters:  11/22/22 145 lb (65.8 kg)  05/24/22 142 lb (64.4 kg)  05/24/22 142 lb (64.4 kg)   Body mass index is 29.29 kg/m.    Physical Exam Constitutional:      General: She is not in acute distress.    Appearance: Normal appearance.  HENT:     Head: Normocephalic and atraumatic.  Eyes:     Conjunctiva/sclera: Conjunctivae normal.  Cardiovascular:     Rate and Rhythm: Normal rate and regular rhythm.     Heart sounds: Murmur (3/6 systolic) heard.  Pulmonary:     Effort: Pulmonary effort is normal. No respiratory distress.     Breath sounds: Normal breath sounds. No wheezing.  Musculoskeletal:     Cervical back: Neck supple.     Right lower leg: Edema (2+ pitting edema) present.     Left lower leg: Edema (2+ pitting edema) present.   Lymphadenopathy:     Cervical: No cervical adenopathy.  Skin:    General: Skin is warm and dry.     Findings: No rash.  Neurological:     Mental Status: She is alert. Mental status is at baseline.  Psychiatric:        Mood and Affect: Mood normal.        Behavior: Behavior normal.        Lab Results  Component Value Date   WBC 6.1 11/18/2021   HGB 12.5 11/18/2021   HCT 38.2 11/18/2021   PLT 237.0 11/18/2021   GLUCOSE 88 08/02/2022   CHOL 172 11/18/2021   TRIG 64.0 11/18/2021   HDL 80.20 11/18/2021   LDLCALC 79 11/18/2021   ALT 10 07/26/2022   AST 22 07/26/2022   NA 133 (L) 08/02/2022   K 4.5 08/02/2022   CL 92 (L) 08/02/2022   CREATININE 1.27 (H) 08/02/2022   BUN 38 (H) 08/02/2022   CO2 34 (H) 08/02/2022   TSH 3.26 05/20/2020   HGBA1C 5.7 11/18/2021     Assessment & Plan:    See Problem List for Assessment and Plan of chronic medical problems.

## 2022-11-21 NOTE — Patient Instructions (Addendum)
      Blood work was ordered.   The lab is on the first floor.    Medications changes include :  increase famotidine to 40 mg daily.   Stop etodolac.  Decrease telmisartan to 40 mg daily.   Monitor your BP at home if you can    An Echo of your heart was ordered and someone will call you to schedule an appointment.     Return in about 3 months (around 02/22/2023) for follow up.

## 2022-11-22 ENCOUNTER — Encounter: Payer: Self-pay | Admitting: Internal Medicine

## 2022-11-22 ENCOUNTER — Ambulatory Visit (INDEPENDENT_AMBULATORY_CARE_PROVIDER_SITE_OTHER): Payer: Medicare Other | Admitting: Internal Medicine

## 2022-11-22 VITALS — BP 108/74 | HR 67 | Temp 98.0°F | Ht 59.0 in | Wt 145.0 lb

## 2022-11-22 DIAGNOSIS — K219 Gastro-esophageal reflux disease without esophagitis: Secondary | ICD-10-CM

## 2022-11-22 DIAGNOSIS — I517 Cardiomegaly: Secondary | ICD-10-CM | POA: Diagnosis not present

## 2022-11-22 DIAGNOSIS — R600 Localized edema: Secondary | ICD-10-CM

## 2022-11-22 DIAGNOSIS — M48061 Spinal stenosis, lumbar region without neurogenic claudication: Secondary | ICD-10-CM | POA: Diagnosis not present

## 2022-11-22 DIAGNOSIS — E785 Hyperlipidemia, unspecified: Secondary | ICD-10-CM

## 2022-11-22 DIAGNOSIS — M150 Primary generalized (osteo)arthritis: Secondary | ICD-10-CM

## 2022-11-22 DIAGNOSIS — N1832 Chronic kidney disease, stage 3b: Secondary | ICD-10-CM

## 2022-11-22 DIAGNOSIS — R7303 Prediabetes: Secondary | ICD-10-CM

## 2022-11-22 DIAGNOSIS — I10 Essential (primary) hypertension: Secondary | ICD-10-CM | POA: Diagnosis not present

## 2022-11-22 DIAGNOSIS — R011 Cardiac murmur, unspecified: Secondary | ICD-10-CM | POA: Diagnosis not present

## 2022-11-22 LAB — CBC WITH DIFFERENTIAL/PLATELET
Basophils Absolute: 0.1 10*3/uL (ref 0.0–0.1)
Basophils Relative: 0.9 % (ref 0.0–3.0)
Eosinophils Absolute: 0.1 10*3/uL (ref 0.0–0.7)
Eosinophils Relative: 1.3 % (ref 0.0–5.0)
HCT: 36.2 % (ref 36.0–46.0)
Hemoglobin: 11.6 g/dL — ABNORMAL LOW (ref 12.0–15.0)
Lymphocytes Relative: 19.9 % (ref 12.0–46.0)
Lymphs Abs: 1.6 10*3/uL (ref 0.7–4.0)
MCHC: 32 g/dL (ref 30.0–36.0)
MCV: 91.6 fL (ref 78.0–100.0)
Monocytes Absolute: 0.6 10*3/uL (ref 0.1–1.0)
Monocytes Relative: 7.4 % (ref 3.0–12.0)
Neutro Abs: 5.8 10*3/uL (ref 1.4–7.7)
Neutrophils Relative %: 70.5 % (ref 43.0–77.0)
Platelets: 303 10*3/uL (ref 150.0–400.0)
RBC: 3.96 Mil/uL (ref 3.87–5.11)
RDW: 14.3 % (ref 11.5–15.5)
WBC: 8.2 10*3/uL (ref 4.0–10.5)

## 2022-11-22 LAB — LIPID PANEL
Cholesterol: 187 mg/dL (ref 0–200)
HDL: 87.9 mg/dL (ref >39.00)
LDL Cholesterol: 88 mg/dL (ref 0–99)
NonHDL: 99.55
Total CHOL/HDL Ratio: 2
Triglycerides: 56 mg/dL (ref 0.0–149.0)
VLDL: 11.2 mg/dL (ref 0.0–40.0)

## 2022-11-22 LAB — COMPREHENSIVE METABOLIC PANEL
ALT: 14 U/L (ref 0–35)
AST: 23 U/L (ref 0–37)
Albumin: 4.1 g/dL (ref 3.5–5.2)
Alkaline Phosphatase: 76 U/L (ref 39–117)
BUN: 22 mg/dL (ref 6–23)
CO2: 34 meq/L — ABNORMAL HIGH (ref 19–32)
Calcium: 10.9 mg/dL — ABNORMAL HIGH (ref 8.4–10.5)
Chloride: 92 meq/L — ABNORMAL LOW (ref 96–112)
Creatinine, Ser: 0.97 mg/dL (ref 0.40–1.20)
GFR: 52.82 mL/min — ABNORMAL LOW (ref >60.00)
Glucose, Bld: 92 mg/dL (ref 70–99)
Potassium: 4.5 meq/L (ref 3.5–5.1)
Sodium: 133 meq/L — ABNORMAL LOW (ref 135–145)
Total Bilirubin: 0.8 mg/dL (ref 0.2–1.2)
Total Protein: 6.8 g/dL (ref 6.0–8.3)

## 2022-11-22 LAB — HEMOGLOBIN A1C: Hgb A1c MFr Bld: 5.4 % (ref 4.6–6.5)

## 2022-11-22 MED ORDER — FUROSEMIDE 20 MG PO TABS: ORAL_TABLET | ORAL | Status: DC

## 2022-11-22 MED ORDER — FAMOTIDINE 40 MG PO TABS: 40.0000 mg | ORAL_TABLET | Freq: Every day | ORAL | 1 refills | Status: DC

## 2022-11-22 MED ORDER — TELMISARTAN 40 MG PO TABS: 40.0000 mg | ORAL_TABLET | Freq: Every day | ORAL | Status: DC

## 2022-11-22 NOTE — Assessment & Plan Note (Addendum)
Chronic BP on low side, GFR decreased Decrease telmisartan to 40 mg daly Continue amlodipine 5 mg daily Monitor BP cmp, CBC

## 2022-11-22 NOTE — Assessment & Plan Note (Signed)
Chronic Regular exercise and healthy diet encouraged Check lipid panel, CMP Continue atorvastatin 20 mg daily 

## 2022-11-22 NOTE — Assessment & Plan Note (Addendum)
Chronic GERD likely not controlled - nausea is likely a symptom and states a lot of reflux Increase pepcid to 40 mg  daily

## 2022-11-22 NOTE — Assessment & Plan Note (Addendum)
GFR consistently decreased Reviewed diagnosis stop etodolac due to decreased GFR Monitor GFR closely  Decrease salt intake cmp

## 2022-11-22 NOTE — Assessment & Plan Note (Addendum)
Chronic Stable Stressed the need to elevate her legs consistently when sitting, stressed getting up and walking more Continue low-sodium diet - stop potato chips CMP Continue Lasix 40 mg in the morning and 20 mg in the afternoon

## 2022-11-22 NOTE — Assessment & Plan Note (Signed)
Chronic Check Echo

## 2022-11-22 NOTE — Assessment & Plan Note (Signed)
Chronic Pain is severe and does limit her significantly-she is very sedentary Pain manageable with tramadol-currently taking tramadol 50 mg twice a day on average, but can go 3 days without medication Not having any side effects-denies constipation She is taking the medication appropriately Continue tramadol 50 mg every 8 hours as needed PDMP checked

## 2022-11-22 NOTE — Assessment & Plan Note (Signed)
Chronic Lab Results  Component Value Date   HGBA1C 5.7 11/18/2021   Check a1c Low sugar / carb diet Stressed regular exercise

## 2022-11-22 NOTE — Assessment & Plan Note (Addendum)
Chronic Generalized Stop etodolac 400 mg daily for now due to uncontrolled GERD and decreased GFR

## 2022-11-23 ENCOUNTER — Telehealth: Payer: Self-pay | Admitting: Internal Medicine

## 2022-11-23 NOTE — Telephone Encounter (Signed)
Tiffany from [facility] called to check if a fax was received. She said it was faxed on 11/03/2022 and 11/18/2022. It is an order with order number  [id number]. Best callback for Anna Adams is [phone], extension [phone]. Her direct fax number is [phone].

## 2022-11-23 NOTE — Telephone Encounter (Signed)
Resent again today

## 2022-11-23 NOTE — Telephone Encounter (Signed)
Tiffany confirmed order was received.

## 2022-11-25 DIAGNOSIS — G5601 Carpal tunnel syndrome, right upper limb: Secondary | ICD-10-CM | POA: Diagnosis not present

## 2022-11-25 DIAGNOSIS — G8929 Other chronic pain: Secondary | ICD-10-CM | POA: Diagnosis not present

## 2022-11-25 DIAGNOSIS — Z9181 History of falling: Secondary | ICD-10-CM | POA: Diagnosis not present

## 2022-11-25 DIAGNOSIS — M150 Primary generalized (osteo)arthritis: Secondary | ICD-10-CM | POA: Diagnosis not present

## 2022-11-25 DIAGNOSIS — Z85828 Personal history of other malignant neoplasm of skin: Secondary | ICD-10-CM | POA: Diagnosis not present

## 2022-11-25 DIAGNOSIS — Z602 Problems related to living alone: Secondary | ICD-10-CM | POA: Diagnosis not present

## 2022-11-25 DIAGNOSIS — H409 Unspecified glaucoma: Secondary | ICD-10-CM | POA: Diagnosis not present

## 2022-11-25 DIAGNOSIS — M810 Age-related osteoporosis without current pathological fracture: Secondary | ICD-10-CM | POA: Diagnosis not present

## 2022-11-25 DIAGNOSIS — K449 Diaphragmatic hernia without obstruction or gangrene: Secondary | ICD-10-CM | POA: Diagnosis not present

## 2022-11-25 DIAGNOSIS — G508 Other disorders of trigeminal nerve: Secondary | ICD-10-CM | POA: Diagnosis not present

## 2022-11-25 DIAGNOSIS — D649 Anemia, unspecified: Secondary | ICD-10-CM | POA: Diagnosis not present

## 2022-11-25 DIAGNOSIS — I110 Hypertensive heart disease with heart failure: Secondary | ICD-10-CM | POA: Diagnosis not present

## 2022-11-25 DIAGNOSIS — I509 Heart failure, unspecified: Secondary | ICD-10-CM | POA: Diagnosis not present

## 2022-11-25 DIAGNOSIS — J45909 Unspecified asthma, uncomplicated: Secondary | ICD-10-CM | POA: Diagnosis not present

## 2022-11-25 DIAGNOSIS — B351 Tinea unguium: Secondary | ICD-10-CM | POA: Diagnosis not present

## 2022-11-25 DIAGNOSIS — E039 Hypothyroidism, unspecified: Secondary | ICD-10-CM | POA: Diagnosis not present

## 2022-11-25 DIAGNOSIS — Z96653 Presence of artificial knee joint, bilateral: Secondary | ICD-10-CM | POA: Diagnosis not present

## 2022-11-25 DIAGNOSIS — K219 Gastro-esophageal reflux disease without esophagitis: Secondary | ICD-10-CM | POA: Diagnosis not present

## 2022-11-25 DIAGNOSIS — E785 Hyperlipidemia, unspecified: Secondary | ICD-10-CM | POA: Diagnosis not present

## 2022-11-25 DIAGNOSIS — K5790 Diverticulosis of intestine, part unspecified, without perforation or abscess without bleeding: Secondary | ICD-10-CM | POA: Diagnosis not present

## 2022-11-25 DIAGNOSIS — D490 Neoplasm of unspecified behavior of digestive system: Secondary | ICD-10-CM | POA: Diagnosis not present

## 2022-11-25 DIAGNOSIS — I959 Hypotension, unspecified: Secondary | ICD-10-CM | POA: Diagnosis not present

## 2022-11-25 DIAGNOSIS — M48061 Spinal stenosis, lumbar region without neurogenic claudication: Secondary | ICD-10-CM | POA: Diagnosis not present

## 2022-11-25 DIAGNOSIS — R7303 Prediabetes: Secondary | ICD-10-CM | POA: Diagnosis not present

## 2022-12-06 ENCOUNTER — Encounter (HOSPITAL_COMMUNITY): Payer: Self-pay | Admitting: Internal Medicine

## 2022-12-07 ENCOUNTER — Telehealth: Payer: Self-pay | Admitting: Internal Medicine

## 2022-12-07 DIAGNOSIS — I509 Heart failure, unspecified: Secondary | ICD-10-CM | POA: Diagnosis not present

## 2022-12-07 DIAGNOSIS — M150 Primary generalized (osteo)arthritis: Secondary | ICD-10-CM | POA: Diagnosis not present

## 2022-12-07 DIAGNOSIS — G8929 Other chronic pain: Secondary | ICD-10-CM | POA: Diagnosis not present

## 2022-12-07 DIAGNOSIS — M48061 Spinal stenosis, lumbar region without neurogenic claudication: Secondary | ICD-10-CM | POA: Diagnosis not present

## 2022-12-07 DIAGNOSIS — R7303 Prediabetes: Secondary | ICD-10-CM | POA: Diagnosis not present

## 2022-12-07 DIAGNOSIS — I110 Hypertensive heart disease with heart failure: Secondary | ICD-10-CM | POA: Diagnosis not present

## 2022-12-07 MED ORDER — TELMISARTAN 40 MG PO TABS: 20.0000 mg | ORAL_TABLET | Freq: Every day | ORAL | Status: DC

## 2022-12-07 NOTE — Telephone Encounter (Signed)
Message left for [facility] with instructions today.

## 2022-12-07 NOTE — Telephone Encounter (Signed)
Stop amlodipine (norvasc) 5 mg.  Decrease telmisartan to 20 mg daily or 1/2 pill daily.    Monitor BP and call after a few days with an update    [Med list updated]

## 2022-12-07 NOTE — Telephone Encounter (Signed)
Enrique Sack from [facility] reports that the pt's BP was at 92/58 today during her visit and Enrique Sack is concerned that this is being caused by furosemide (LASIX) 40 MG tablet   Please called Enrique Sack and advise: [phone]

## 2022-12-10 DIAGNOSIS — H409 Unspecified glaucoma: Secondary | ICD-10-CM | POA: Diagnosis not present

## 2022-12-10 DIAGNOSIS — G8929 Other chronic pain: Secondary | ICD-10-CM | POA: Diagnosis not present

## 2022-12-10 DIAGNOSIS — M48061 Spinal stenosis, lumbar region without neurogenic claudication: Secondary | ICD-10-CM | POA: Diagnosis not present

## 2022-12-10 DIAGNOSIS — M150 Primary generalized (osteo)arthritis: Secondary | ICD-10-CM | POA: Diagnosis not present

## 2022-12-10 DIAGNOSIS — G508 Other disorders of trigeminal nerve: Secondary | ICD-10-CM | POA: Diagnosis not present

## 2022-12-10 DIAGNOSIS — I110 Hypertensive heart disease with heart failure: Secondary | ICD-10-CM | POA: Diagnosis not present

## 2022-12-10 DIAGNOSIS — R7303 Prediabetes: Secondary | ICD-10-CM | POA: Diagnosis not present

## 2022-12-10 DIAGNOSIS — G5601 Carpal tunnel syndrome, right upper limb: Secondary | ICD-10-CM | POA: Diagnosis not present

## 2022-12-10 DIAGNOSIS — J45909 Unspecified asthma, uncomplicated: Secondary | ICD-10-CM | POA: Diagnosis not present

## 2022-12-10 DIAGNOSIS — I959 Hypotension, unspecified: Secondary | ICD-10-CM | POA: Diagnosis not present

## 2022-12-10 DIAGNOSIS — M810 Age-related osteoporosis without current pathological fracture: Secondary | ICD-10-CM | POA: Diagnosis not present

## 2022-12-10 DIAGNOSIS — I509 Heart failure, unspecified: Secondary | ICD-10-CM | POA: Diagnosis not present

## 2022-12-13 DIAGNOSIS — M48061 Spinal stenosis, lumbar region without neurogenic claudication: Secondary | ICD-10-CM | POA: Diagnosis not present

## 2022-12-13 DIAGNOSIS — R7303 Prediabetes: Secondary | ICD-10-CM | POA: Diagnosis not present

## 2022-12-13 DIAGNOSIS — G8929 Other chronic pain: Secondary | ICD-10-CM | POA: Diagnosis not present

## 2022-12-13 DIAGNOSIS — I509 Heart failure, unspecified: Secondary | ICD-10-CM | POA: Diagnosis not present

## 2022-12-13 DIAGNOSIS — M150 Primary generalized (osteo)arthritis: Secondary | ICD-10-CM | POA: Diagnosis not present

## 2022-12-13 DIAGNOSIS — I110 Hypertensive heart disease with heart failure: Secondary | ICD-10-CM | POA: Diagnosis not present

## 2022-12-15 DIAGNOSIS — I110 Hypertensive heart disease with heart failure: Secondary | ICD-10-CM | POA: Diagnosis not present

## 2022-12-15 DIAGNOSIS — I509 Heart failure, unspecified: Secondary | ICD-10-CM | POA: Diagnosis not present

## 2022-12-15 DIAGNOSIS — G8929 Other chronic pain: Secondary | ICD-10-CM | POA: Diagnosis not present

## 2022-12-15 DIAGNOSIS — R7303 Prediabetes: Secondary | ICD-10-CM | POA: Diagnosis not present

## 2022-12-15 DIAGNOSIS — M48061 Spinal stenosis, lumbar region without neurogenic claudication: Secondary | ICD-10-CM | POA: Diagnosis not present

## 2022-12-15 DIAGNOSIS — M150 Primary generalized (osteo)arthritis: Secondary | ICD-10-CM | POA: Diagnosis not present

## 2022-12-17 ENCOUNTER — Other Ambulatory Visit: Payer: Self-pay | Admitting: Internal Medicine

## 2022-12-21 DIAGNOSIS — M810 Age-related osteoporosis without current pathological fracture: Secondary | ICD-10-CM | POA: Diagnosis not present

## 2022-12-21 DIAGNOSIS — R7301 Impaired fasting glucose: Secondary | ICD-10-CM | POA: Diagnosis not present

## 2022-12-21 DIAGNOSIS — E890 Postprocedural hypothyroidism: Secondary | ICD-10-CM | POA: Diagnosis not present

## 2022-12-21 DIAGNOSIS — I10 Essential (primary) hypertension: Secondary | ICD-10-CM | POA: Diagnosis not present

## 2022-12-21 DIAGNOSIS — E210 Primary hyperparathyroidism: Secondary | ICD-10-CM | POA: Diagnosis not present

## 2022-12-22 ENCOUNTER — Other Ambulatory Visit: Payer: Self-pay | Admitting: Endocrinology

## 2022-12-22 DIAGNOSIS — I110 Hypertensive heart disease with heart failure: Secondary | ICD-10-CM | POA: Diagnosis not present

## 2022-12-22 DIAGNOSIS — G8929 Other chronic pain: Secondary | ICD-10-CM | POA: Diagnosis not present

## 2022-12-22 DIAGNOSIS — I509 Heart failure, unspecified: Secondary | ICD-10-CM | POA: Diagnosis not present

## 2022-12-22 DIAGNOSIS — M810 Age-related osteoporosis without current pathological fracture: Secondary | ICD-10-CM

## 2022-12-22 DIAGNOSIS — M150 Primary generalized (osteo)arthritis: Secondary | ICD-10-CM | POA: Diagnosis not present

## 2022-12-22 DIAGNOSIS — M48061 Spinal stenosis, lumbar region without neurogenic claudication: Secondary | ICD-10-CM | POA: Diagnosis not present

## 2022-12-22 DIAGNOSIS — R7303 Prediabetes: Secondary | ICD-10-CM | POA: Diagnosis not present

## 2022-12-25 DIAGNOSIS — K5790 Diverticulosis of intestine, part unspecified, without perforation or abscess without bleeding: Secondary | ICD-10-CM | POA: Diagnosis not present

## 2022-12-25 DIAGNOSIS — G508 Other disorders of trigeminal nerve: Secondary | ICD-10-CM | POA: Diagnosis not present

## 2022-12-25 DIAGNOSIS — G8929 Other chronic pain: Secondary | ICD-10-CM | POA: Diagnosis not present

## 2022-12-25 DIAGNOSIS — Z9181 History of falling: Secondary | ICD-10-CM | POA: Diagnosis not present

## 2022-12-25 DIAGNOSIS — H409 Unspecified glaucoma: Secondary | ICD-10-CM | POA: Diagnosis not present

## 2022-12-25 DIAGNOSIS — E039 Hypothyroidism, unspecified: Secondary | ICD-10-CM | POA: Diagnosis not present

## 2022-12-25 DIAGNOSIS — E785 Hyperlipidemia, unspecified: Secondary | ICD-10-CM | POA: Diagnosis not present

## 2022-12-25 DIAGNOSIS — K449 Diaphragmatic hernia without obstruction or gangrene: Secondary | ICD-10-CM | POA: Diagnosis not present

## 2022-12-25 DIAGNOSIS — M48061 Spinal stenosis, lumbar region without neurogenic claudication: Secondary | ICD-10-CM | POA: Diagnosis not present

## 2022-12-25 DIAGNOSIS — I959 Hypotension, unspecified: Secondary | ICD-10-CM | POA: Diagnosis not present

## 2022-12-25 DIAGNOSIS — I509 Heart failure, unspecified: Secondary | ICD-10-CM | POA: Diagnosis not present

## 2022-12-25 DIAGNOSIS — R7303 Prediabetes: Secondary | ICD-10-CM | POA: Diagnosis not present

## 2022-12-25 DIAGNOSIS — D490 Neoplasm of unspecified behavior of digestive system: Secondary | ICD-10-CM | POA: Diagnosis not present

## 2022-12-25 DIAGNOSIS — J45909 Unspecified asthma, uncomplicated: Secondary | ICD-10-CM | POA: Diagnosis not present

## 2022-12-25 DIAGNOSIS — M810 Age-related osteoporosis without current pathological fracture: Secondary | ICD-10-CM | POA: Diagnosis not present

## 2022-12-25 DIAGNOSIS — K219 Gastro-esophageal reflux disease without esophagitis: Secondary | ICD-10-CM | POA: Diagnosis not present

## 2022-12-25 DIAGNOSIS — G5601 Carpal tunnel syndrome, right upper limb: Secondary | ICD-10-CM | POA: Diagnosis not present

## 2022-12-25 DIAGNOSIS — M150 Primary generalized (osteo)arthritis: Secondary | ICD-10-CM | POA: Diagnosis not present

## 2022-12-25 DIAGNOSIS — B351 Tinea unguium: Secondary | ICD-10-CM | POA: Diagnosis not present

## 2022-12-25 DIAGNOSIS — I110 Hypertensive heart disease with heart failure: Secondary | ICD-10-CM | POA: Diagnosis not present

## 2022-12-25 DIAGNOSIS — Z96653 Presence of artificial knee joint, bilateral: Secondary | ICD-10-CM | POA: Diagnosis not present

## 2022-12-25 DIAGNOSIS — Z85828 Personal history of other malignant neoplasm of skin: Secondary | ICD-10-CM | POA: Diagnosis not present

## 2022-12-25 DIAGNOSIS — D649 Anemia, unspecified: Secondary | ICD-10-CM | POA: Diagnosis not present

## 2022-12-25 DIAGNOSIS — Z602 Problems related to living alone: Secondary | ICD-10-CM | POA: Diagnosis not present

## 2022-12-27 DIAGNOSIS — M150 Primary generalized (osteo)arthritis: Secondary | ICD-10-CM | POA: Diagnosis not present

## 2022-12-27 DIAGNOSIS — R7303 Prediabetes: Secondary | ICD-10-CM | POA: Diagnosis not present

## 2022-12-27 DIAGNOSIS — G8929 Other chronic pain: Secondary | ICD-10-CM | POA: Diagnosis not present

## 2022-12-27 DIAGNOSIS — I509 Heart failure, unspecified: Secondary | ICD-10-CM | POA: Diagnosis not present

## 2022-12-27 DIAGNOSIS — I110 Hypertensive heart disease with heart failure: Secondary | ICD-10-CM | POA: Diagnosis not present

## 2022-12-27 DIAGNOSIS — M48061 Spinal stenosis, lumbar region without neurogenic claudication: Secondary | ICD-10-CM | POA: Diagnosis not present

## 2022-12-28 DIAGNOSIS — M48061 Spinal stenosis, lumbar region without neurogenic claudication: Secondary | ICD-10-CM | POA: Diagnosis not present

## 2022-12-28 DIAGNOSIS — G8929 Other chronic pain: Secondary | ICD-10-CM | POA: Diagnosis not present

## 2022-12-28 DIAGNOSIS — R7303 Prediabetes: Secondary | ICD-10-CM | POA: Diagnosis not present

## 2022-12-28 DIAGNOSIS — I110 Hypertensive heart disease with heart failure: Secondary | ICD-10-CM | POA: Diagnosis not present

## 2022-12-28 DIAGNOSIS — M150 Primary generalized (osteo)arthritis: Secondary | ICD-10-CM | POA: Diagnosis not present

## 2022-12-28 DIAGNOSIS — I509 Heart failure, unspecified: Secondary | ICD-10-CM | POA: Diagnosis not present

## 2023-01-12 DIAGNOSIS — I509 Heart failure, unspecified: Secondary | ICD-10-CM | POA: Diagnosis not present

## 2023-01-12 DIAGNOSIS — M48061 Spinal stenosis, lumbar region without neurogenic claudication: Secondary | ICD-10-CM | POA: Diagnosis not present

## 2023-01-12 DIAGNOSIS — R7303 Prediabetes: Secondary | ICD-10-CM | POA: Diagnosis not present

## 2023-01-12 DIAGNOSIS — M150 Primary generalized (osteo)arthritis: Secondary | ICD-10-CM | POA: Diagnosis not present

## 2023-01-12 DIAGNOSIS — G8929 Other chronic pain: Secondary | ICD-10-CM | POA: Diagnosis not present

## 2023-01-12 DIAGNOSIS — I110 Hypertensive heart disease with heart failure: Secondary | ICD-10-CM | POA: Diagnosis not present

## 2023-01-14 ENCOUNTER — Telehealth: Payer: Self-pay

## 2023-01-14 NOTE — Telephone Encounter (Signed)
Noted in sticky note

## 2023-01-14 NOTE — Telephone Encounter (Signed)
Communication  Reason for CRM: Pt was notified of a message on her MyChart; however, she has difficulty accessing Mychart. Pt requests call back, CB# [phone] or [phone]. For future messages, please contact by phone instead of MyChart

## 2023-01-18 ENCOUNTER — Other Ambulatory Visit: Payer: Self-pay | Admitting: Internal Medicine

## 2023-01-21 DIAGNOSIS — I110 Hypertensive heart disease with heart failure: Secondary | ICD-10-CM | POA: Diagnosis not present

## 2023-01-21 DIAGNOSIS — M48061 Spinal stenosis, lumbar region without neurogenic claudication: Secondary | ICD-10-CM | POA: Diagnosis not present

## 2023-01-21 DIAGNOSIS — G8929 Other chronic pain: Secondary | ICD-10-CM | POA: Diagnosis not present

## 2023-01-21 DIAGNOSIS — M150 Primary generalized (osteo)arthritis: Secondary | ICD-10-CM | POA: Diagnosis not present

## 2023-01-21 DIAGNOSIS — I509 Heart failure, unspecified: Secondary | ICD-10-CM | POA: Diagnosis not present

## 2023-01-21 DIAGNOSIS — R7303 Prediabetes: Secondary | ICD-10-CM | POA: Diagnosis not present

## 2023-01-24 DIAGNOSIS — E785 Hyperlipidemia, unspecified: Secondary | ICD-10-CM | POA: Diagnosis not present

## 2023-01-24 DIAGNOSIS — D490 Neoplasm of unspecified behavior of digestive system: Secondary | ICD-10-CM | POA: Diagnosis not present

## 2023-01-24 DIAGNOSIS — D649 Anemia, unspecified: Secondary | ICD-10-CM | POA: Diagnosis not present

## 2023-01-24 DIAGNOSIS — M810 Age-related osteoporosis without current pathological fracture: Secondary | ICD-10-CM | POA: Diagnosis not present

## 2023-01-24 DIAGNOSIS — B351 Tinea unguium: Secondary | ICD-10-CM | POA: Diagnosis not present

## 2023-01-24 DIAGNOSIS — R7303 Prediabetes: Secondary | ICD-10-CM | POA: Diagnosis not present

## 2023-01-24 DIAGNOSIS — H409 Unspecified glaucoma: Secondary | ICD-10-CM | POA: Diagnosis not present

## 2023-01-24 DIAGNOSIS — K5790 Diverticulosis of intestine, part unspecified, without perforation or abscess without bleeding: Secondary | ICD-10-CM | POA: Diagnosis not present

## 2023-01-24 DIAGNOSIS — E039 Hypothyroidism, unspecified: Secondary | ICD-10-CM | POA: Diagnosis not present

## 2023-01-24 DIAGNOSIS — I110 Hypertensive heart disease with heart failure: Secondary | ICD-10-CM | POA: Diagnosis not present

## 2023-01-24 DIAGNOSIS — M4807 Spinal stenosis, lumbosacral region: Secondary | ICD-10-CM | POA: Diagnosis not present

## 2023-01-24 DIAGNOSIS — Z96653 Presence of artificial knee joint, bilateral: Secondary | ICD-10-CM | POA: Diagnosis not present

## 2023-01-24 DIAGNOSIS — M150 Primary generalized (osteo)arthritis: Secondary | ICD-10-CM | POA: Diagnosis not present

## 2023-01-24 DIAGNOSIS — K219 Gastro-esophageal reflux disease without esophagitis: Secondary | ICD-10-CM | POA: Diagnosis not present

## 2023-01-24 DIAGNOSIS — M48061 Spinal stenosis, lumbar region without neurogenic claudication: Secondary | ICD-10-CM | POA: Diagnosis not present

## 2023-01-24 DIAGNOSIS — I509 Heart failure, unspecified: Secondary | ICD-10-CM | POA: Diagnosis not present

## 2023-01-24 DIAGNOSIS — H9193 Unspecified hearing loss, bilateral: Secondary | ICD-10-CM | POA: Diagnosis not present

## 2023-01-24 DIAGNOSIS — J45909 Unspecified asthma, uncomplicated: Secondary | ICD-10-CM | POA: Diagnosis not present

## 2023-01-24 DIAGNOSIS — I959 Hypotension, unspecified: Secondary | ICD-10-CM | POA: Diagnosis not present

## 2023-01-24 DIAGNOSIS — K449 Diaphragmatic hernia without obstruction or gangrene: Secondary | ICD-10-CM | POA: Diagnosis not present

## 2023-01-24 DIAGNOSIS — G508 Other disorders of trigeminal nerve: Secondary | ICD-10-CM | POA: Diagnosis not present

## 2023-01-24 DIAGNOSIS — Z79899 Other long term (current) drug therapy: Secondary | ICD-10-CM | POA: Diagnosis not present

## 2023-01-24 DIAGNOSIS — G5601 Carpal tunnel syndrome, right upper limb: Secondary | ICD-10-CM | POA: Diagnosis not present

## 2023-01-24 DIAGNOSIS — G8929 Other chronic pain: Secondary | ICD-10-CM | POA: Diagnosis not present

## 2023-01-24 DIAGNOSIS — Z79891 Long term (current) use of opiate analgesic: Secondary | ICD-10-CM | POA: Diagnosis not present

## 2023-01-28 ENCOUNTER — Telehealth: Payer: Self-pay

## 2023-01-28 NOTE — Telephone Encounter (Signed)
 Patient request refill for Lantaprost that was prescribed by Ophthalmology. Asked patient if she had reached out to them for refill and she said no.  I advised that she reach out to them to for refill and to see if she still needed to be taking eyedrops.

## 2023-01-28 NOTE — Telephone Encounter (Signed)
 Copied from CRM [id number]. Topic: Clinical - Medication Refill >> Jan 27, 2023  4:30 PM Maisie BROCKS wrote: Most Recent Primary Care Visit:  Provider: GEOFM GLADE PARAS  Department: [facility]  Visit Type: OFFICE VISIT  Date: 11/22/2022  Medication: Eye drops   Has the patient contacted their pharmacy? Yes Pt stated that she was receiving medicated eye drops from [facility] but the prescription request was denied. She wanted to know if Dr. Geofm could refill it for her.   Is this the correct pharmacy for this prescription? Yes  This is the patient's preferred pharmacy:  [facility] [address] [place] - [address] [place] [address] Phone: [phone] Fax: [phone]    Has the prescription been filled recently? No  Is the patient out of the medication? Yes  Has the patient been seen for an appointment in the last year OR does the patient have an upcoming appointment? Yes  Can we respond through MyChart? No  Agent: Please be advised that Rx refills may take up to 3 business days. We ask that you follow-up with your pharmacy.

## 2023-01-31 DIAGNOSIS — R7303 Prediabetes: Secondary | ICD-10-CM | POA: Diagnosis not present

## 2023-01-31 DIAGNOSIS — M48061 Spinal stenosis, lumbar region without neurogenic claudication: Secondary | ICD-10-CM | POA: Diagnosis not present

## 2023-01-31 DIAGNOSIS — I509 Heart failure, unspecified: Secondary | ICD-10-CM | POA: Diagnosis not present

## 2023-01-31 DIAGNOSIS — H409 Unspecified glaucoma: Secondary | ICD-10-CM | POA: Diagnosis not present

## 2023-01-31 DIAGNOSIS — I959 Hypotension, unspecified: Secondary | ICD-10-CM | POA: Diagnosis not present

## 2023-01-31 DIAGNOSIS — G8929 Other chronic pain: Secondary | ICD-10-CM | POA: Diagnosis not present

## 2023-01-31 DIAGNOSIS — G5601 Carpal tunnel syndrome, right upper limb: Secondary | ICD-10-CM | POA: Diagnosis not present

## 2023-01-31 DIAGNOSIS — M150 Primary generalized (osteo)arthritis: Secondary | ICD-10-CM | POA: Diagnosis not present

## 2023-01-31 DIAGNOSIS — M4807 Spinal stenosis, lumbosacral region: Secondary | ICD-10-CM | POA: Diagnosis not present

## 2023-01-31 DIAGNOSIS — I110 Hypertensive heart disease with heart failure: Secondary | ICD-10-CM | POA: Diagnosis not present

## 2023-01-31 DIAGNOSIS — G508 Other disorders of trigeminal nerve: Secondary | ICD-10-CM | POA: Diagnosis not present

## 2023-01-31 DIAGNOSIS — J45909 Unspecified asthma, uncomplicated: Secondary | ICD-10-CM | POA: Diagnosis not present

## 2023-02-01 DIAGNOSIS — H40053 Ocular hypertension, bilateral: Secondary | ICD-10-CM | POA: Diagnosis not present

## 2023-02-03 ENCOUNTER — Other Ambulatory Visit: Payer: Self-pay | Admitting: Internal Medicine

## 2023-02-07 ENCOUNTER — Ambulatory Visit: Payer: Medicare Other | Admitting: Podiatry

## 2023-02-08 DIAGNOSIS — I509 Heart failure, unspecified: Secondary | ICD-10-CM | POA: Diagnosis not present

## 2023-02-08 DIAGNOSIS — M48061 Spinal stenosis, lumbar region without neurogenic claudication: Secondary | ICD-10-CM | POA: Diagnosis not present

## 2023-02-08 DIAGNOSIS — G8929 Other chronic pain: Secondary | ICD-10-CM | POA: Diagnosis not present

## 2023-02-08 DIAGNOSIS — M150 Primary generalized (osteo)arthritis: Secondary | ICD-10-CM | POA: Diagnosis not present

## 2023-02-08 DIAGNOSIS — I110 Hypertensive heart disease with heart failure: Secondary | ICD-10-CM | POA: Diagnosis not present

## 2023-02-08 DIAGNOSIS — M4807 Spinal stenosis, lumbosacral region: Secondary | ICD-10-CM | POA: Diagnosis not present

## 2023-02-14 DIAGNOSIS — I509 Heart failure, unspecified: Secondary | ICD-10-CM | POA: Diagnosis not present

## 2023-02-14 DIAGNOSIS — M4807 Spinal stenosis, lumbosacral region: Secondary | ICD-10-CM | POA: Diagnosis not present

## 2023-02-14 DIAGNOSIS — G8929 Other chronic pain: Secondary | ICD-10-CM | POA: Diagnosis not present

## 2023-02-14 DIAGNOSIS — M150 Primary generalized (osteo)arthritis: Secondary | ICD-10-CM | POA: Diagnosis not present

## 2023-02-14 DIAGNOSIS — M48061 Spinal stenosis, lumbar region without neurogenic claudication: Secondary | ICD-10-CM | POA: Diagnosis not present

## 2023-02-14 DIAGNOSIS — I110 Hypertensive heart disease with heart failure: Secondary | ICD-10-CM | POA: Diagnosis not present

## 2023-02-18 ENCOUNTER — Other Ambulatory Visit: Payer: Self-pay | Admitting: Internal Medicine

## 2023-02-23 ENCOUNTER — Ambulatory Visit: Payer: Medicare Other | Admitting: Internal Medicine

## 2023-02-23 DIAGNOSIS — K219 Gastro-esophageal reflux disease without esophagitis: Secondary | ICD-10-CM | POA: Diagnosis not present

## 2023-02-23 DIAGNOSIS — D649 Anemia, unspecified: Secondary | ICD-10-CM | POA: Diagnosis not present

## 2023-02-23 DIAGNOSIS — Z79899 Other long term (current) drug therapy: Secondary | ICD-10-CM | POA: Diagnosis not present

## 2023-02-23 DIAGNOSIS — Z96653 Presence of artificial knee joint, bilateral: Secondary | ICD-10-CM | POA: Diagnosis not present

## 2023-02-23 DIAGNOSIS — M150 Primary generalized (osteo)arthritis: Secondary | ICD-10-CM | POA: Diagnosis not present

## 2023-02-23 DIAGNOSIS — J45909 Unspecified asthma, uncomplicated: Secondary | ICD-10-CM | POA: Diagnosis not present

## 2023-02-23 DIAGNOSIS — G8929 Other chronic pain: Secondary | ICD-10-CM | POA: Diagnosis not present

## 2023-02-23 DIAGNOSIS — I959 Hypotension, unspecified: Secondary | ICD-10-CM | POA: Diagnosis not present

## 2023-02-23 DIAGNOSIS — G508 Other disorders of trigeminal nerve: Secondary | ICD-10-CM | POA: Diagnosis not present

## 2023-02-23 DIAGNOSIS — I110 Hypertensive heart disease with heart failure: Secondary | ICD-10-CM | POA: Diagnosis not present

## 2023-02-23 DIAGNOSIS — H9193 Unspecified hearing loss, bilateral: Secondary | ICD-10-CM | POA: Diagnosis not present

## 2023-02-23 DIAGNOSIS — G5601 Carpal tunnel syndrome, right upper limb: Secondary | ICD-10-CM | POA: Diagnosis not present

## 2023-02-23 DIAGNOSIS — Z79891 Long term (current) use of opiate analgesic: Secondary | ICD-10-CM | POA: Diagnosis not present

## 2023-02-23 DIAGNOSIS — R7303 Prediabetes: Secondary | ICD-10-CM | POA: Diagnosis not present

## 2023-02-23 DIAGNOSIS — D490 Neoplasm of unspecified behavior of digestive system: Secondary | ICD-10-CM | POA: Diagnosis not present

## 2023-02-23 DIAGNOSIS — K5790 Diverticulosis of intestine, part unspecified, without perforation or abscess without bleeding: Secondary | ICD-10-CM | POA: Diagnosis not present

## 2023-02-23 DIAGNOSIS — H409 Unspecified glaucoma: Secondary | ICD-10-CM | POA: Diagnosis not present

## 2023-02-23 DIAGNOSIS — E785 Hyperlipidemia, unspecified: Secondary | ICD-10-CM | POA: Diagnosis not present

## 2023-02-23 DIAGNOSIS — M810 Age-related osteoporosis without current pathological fracture: Secondary | ICD-10-CM | POA: Diagnosis not present

## 2023-02-23 DIAGNOSIS — B351 Tinea unguium: Secondary | ICD-10-CM | POA: Diagnosis not present

## 2023-02-23 DIAGNOSIS — M48061 Spinal stenosis, lumbar region without neurogenic claudication: Secondary | ICD-10-CM | POA: Diagnosis not present

## 2023-02-23 DIAGNOSIS — M4807 Spinal stenosis, lumbosacral region: Secondary | ICD-10-CM | POA: Diagnosis not present

## 2023-02-23 DIAGNOSIS — K449 Diaphragmatic hernia without obstruction or gangrene: Secondary | ICD-10-CM | POA: Diagnosis not present

## 2023-02-23 DIAGNOSIS — I509 Heart failure, unspecified: Secondary | ICD-10-CM | POA: Diagnosis not present

## 2023-02-23 DIAGNOSIS — E039 Hypothyroidism, unspecified: Secondary | ICD-10-CM | POA: Diagnosis not present

## 2023-03-09 DIAGNOSIS — I509 Heart failure, unspecified: Secondary | ICD-10-CM | POA: Diagnosis not present

## 2023-03-09 DIAGNOSIS — G8929 Other chronic pain: Secondary | ICD-10-CM | POA: Diagnosis not present

## 2023-03-09 DIAGNOSIS — M48061 Spinal stenosis, lumbar region without neurogenic claudication: Secondary | ICD-10-CM | POA: Diagnosis not present

## 2023-03-09 DIAGNOSIS — M150 Primary generalized (osteo)arthritis: Secondary | ICD-10-CM | POA: Diagnosis not present

## 2023-03-09 DIAGNOSIS — I110 Hypertensive heart disease with heart failure: Secondary | ICD-10-CM | POA: Diagnosis not present

## 2023-03-09 DIAGNOSIS — M4807 Spinal stenosis, lumbosacral region: Secondary | ICD-10-CM | POA: Diagnosis not present

## 2023-03-22 ENCOUNTER — Other Ambulatory Visit: Payer: Self-pay | Admitting: Internal Medicine

## 2023-03-22 DIAGNOSIS — M48061 Spinal stenosis, lumbar region without neurogenic claudication: Secondary | ICD-10-CM | POA: Diagnosis not present

## 2023-03-22 DIAGNOSIS — I110 Hypertensive heart disease with heart failure: Secondary | ICD-10-CM | POA: Diagnosis not present

## 2023-03-22 DIAGNOSIS — G8929 Other chronic pain: Secondary | ICD-10-CM | POA: Diagnosis not present

## 2023-03-22 DIAGNOSIS — M150 Primary generalized (osteo)arthritis: Secondary | ICD-10-CM | POA: Diagnosis not present

## 2023-03-22 DIAGNOSIS — M4807 Spinal stenosis, lumbosacral region: Secondary | ICD-10-CM | POA: Diagnosis not present

## 2023-03-22 DIAGNOSIS — I509 Heart failure, unspecified: Secondary | ICD-10-CM | POA: Diagnosis not present

## 2023-03-23 NOTE — Telephone Encounter (Signed)
 Please advise, see below.  LOV: 11/22/2022 Last rx refill: 02/21/2023 #90 no refills

## 2023-04-20 ENCOUNTER — Other Ambulatory Visit: Payer: Self-pay | Admitting: Internal Medicine

## 2023-04-28 ENCOUNTER — Telehealth: Payer: Self-pay

## 2023-04-28 NOTE — Telephone Encounter (Signed)
 Patient contacted today and left message for her to return call to clinic to set up follow up appointment.

## 2023-05-02 ENCOUNTER — Encounter: Payer: Self-pay | Admitting: Internal Medicine

## 2023-05-02 NOTE — Patient Instructions (Signed)
      Blood work was ordered.       Medications changes include :   None    A referral was ordered and someone will call you to schedule an appointment.     No follow-ups on file.

## 2023-05-02 NOTE — Progress Notes (Unsigned)
 Subjective:    Patient ID: Anna Adams, female    DOB: 12-27-1934, 89 y.o.   MRN: [id number]     HPI Edison is here for follow up of her chronic medical problems.  BP at home checked by PT. BP has been good-they do not know exact numbers.  Back pain is stable.   Medications and allergies reviewed with patient and updated if appropriate.  Current Outpatient Medications on File Prior to Visit  Medication Sig Dispense Refill   atorvastatin (LIPITOR) 20 MG tablet NEW PRESCRIPTION REQUEST: TAKE ONE TABLET BY MOUTH DAILY 90 tablet 3   b complex vitamins tablet Take 1 tablet by mouth daily.     Biotin 40981 MCG TABS See admin instructions.     cholecalciferol (VITAMIN D) 1000 UNITS tablet Take 400 Units by mouth daily.     famotidine (PEPCID) 40 MG tablet Take 1 tablet (40 mg total) by mouth daily. 90 tablet 1   furosemide (LASIX) 20 MG tablet Take 1 tablet by mouth daily in the afternoon     furosemide (LASIX) 40 MG tablet TAKE ONE TABLET (40MG  TOTAL) BY MOUTH IN THE MORNING (Patient taking differently: Patient takes 40 mg and then an additional 20 mg daily) 30 tablet 10   hydrocortisone-pramoxine (ANALPRAM HC) 2.5-1 % rectal cream Place 1 application rectally 3 (three) times daily. 30 g 0   latanoprost (XALATAN) 0.005 % ophthalmic solution      levothyroxine (SYNTHROID) 125 MCG tablet NEW PRESCRIPTION REQUEST: TAKE ONE TABLET BY MOUTH DAILY 90 tablet 3   potassium chloride SA (KLOR-CON M) 20 MEQ tablet Take 1 tablet (20 mEq total) by mouth daily. 30 tablet 5   Pramoxine-HC (HYDROCORTISONE ACE-PRAMOXINE) 2.5-1 % CREA Place rectally.     PROVENTIL HFA 108 (90 Base) MCG/ACT inhaler INHALE 1-2 PUFFS INTO THE LUNGS EVERY 6 (SIX) HOURS AS NEEDED FOR WHEEZING. 6.7 each 7   traMADol (ULTRAM) 50 MG tablet TAKE 1 TABLET BY MOUTH EVERY 8 HOURS 90 tablet 0   UNABLE TO FIND at bedtime. Osteo-ease     vitamin E 200 UNIT capsule Take 200 Units by mouth daily.     zoledronic acid (RECLAST) 5  MG/100ML SOLN injection See admin instructions.     No current facility-administered medications on file prior to visit.     Review of Systems  Constitutional:  Positive for appetite change (decreased). Negative for fever.  HENT:  Negative for trouble swallowing.   Respiratory:  Positive for shortness of breath (chronic - no change). Negative for cough and wheezing.   Cardiovascular:  Positive for leg swelling. Negative for chest pain and palpitations.  Gastrointestinal:  Positive for nausea. Negative for abdominal pain.       No gerd  Neurological:  Negative for dizziness, light-headedness and headaches.       Objective:   Vitals:   05/03/23 1429  BP: 114/72  Pulse: 75  Temp: 98.4 F (36.9 C)  SpO2: 94%   BP Readings from Last 3 Encounters:  05/03/23 114/72  11/22/22 108/74  08/04/22 114/67   Wt Readings from Last 3 Encounters:  05/03/23 139 lb (63 kg)  11/22/22 145 lb (65.8 kg)  05/24/22 142 lb (64.4 kg)   Body mass index is 28.07 kg/m.    Physical Exam Constitutional:      General: She is not in acute distress.    Appearance: Normal appearance.  HENT:     Head: Normocephalic and atraumatic.  Eyes:  Conjunctiva/sclera: Conjunctivae normal.  Cardiovascular:     Rate and Rhythm: Normal rate and regular rhythm.     Heart sounds: Normal heart sounds.  Pulmonary:     Effort: Pulmonary effort is normal. No respiratory distress.     Breath sounds: Normal breath sounds. No wheezing.  Musculoskeletal:     Cervical back: Neck supple.     Right lower leg: Edema (Moderate ankle edema) present.     Left lower leg: Edema (Moderate ankle edema) present.  Lymphadenopathy:     Cervical: No cervical adenopathy.  Skin:    General: Skin is warm and dry.     Findings: No rash.  Neurological:     Mental Status: She is alert. Mental status is at baseline.  Psychiatric:        Mood and Affect: Mood normal.        Behavior: Behavior normal.        Lab Results   Component Value Date   WBC 8.2 11/22/2022   HGB 11.6 (L) 11/22/2022   HCT 36.2 11/22/2022   PLT 303.0 11/22/2022   GLUCOSE 92 11/22/2022   CHOL 187 11/22/2022   TRIG 56.0 11/22/2022   HDL 87.90 11/22/2022   LDLCALC 88 11/22/2022   ALT 14 11/22/2022   AST 23 11/22/2022   NA 133 (L) 11/22/2022   K 4.5 11/22/2022   CL 92 (L) 11/22/2022   CREATININE 0.97 11/22/2022   BUN 22 11/22/2022   CO2 34 (H) 11/22/2022   TSH 3.26 05/20/2020   HGBA1C 5.4 11/22/2022     Assessment & Plan:    See Problem List for Assessment and Plan of chronic medical problems.

## 2023-05-03 ENCOUNTER — Ambulatory Visit (INDEPENDENT_AMBULATORY_CARE_PROVIDER_SITE_OTHER): Admitting: Internal Medicine

## 2023-05-03 ENCOUNTER — Encounter: Payer: Self-pay | Admitting: Internal Medicine

## 2023-05-03 VITALS — BP 114/72 | HR 75 | Temp 98.4°F | Ht 59.0 in | Wt 139.0 lb

## 2023-05-03 DIAGNOSIS — R7303 Prediabetes: Secondary | ICD-10-CM | POA: Diagnosis not present

## 2023-05-03 DIAGNOSIS — R600 Localized edema: Secondary | ICD-10-CM | POA: Diagnosis not present

## 2023-05-03 DIAGNOSIS — N1832 Chronic kidney disease, stage 3b: Secondary | ICD-10-CM | POA: Diagnosis not present

## 2023-05-03 DIAGNOSIS — I10 Essential (primary) hypertension: Secondary | ICD-10-CM | POA: Diagnosis not present

## 2023-05-03 DIAGNOSIS — K219 Gastro-esophageal reflux disease without esophagitis: Secondary | ICD-10-CM | POA: Diagnosis not present

## 2023-05-03 DIAGNOSIS — M150 Primary generalized (osteo)arthritis: Secondary | ICD-10-CM | POA: Diagnosis not present

## 2023-05-03 DIAGNOSIS — E785 Hyperlipidemia, unspecified: Secondary | ICD-10-CM | POA: Diagnosis not present

## 2023-05-03 DIAGNOSIS — M48062 Spinal stenosis, lumbar region with neurogenic claudication: Secondary | ICD-10-CM | POA: Diagnosis not present

## 2023-05-03 LAB — COMPREHENSIVE METABOLIC PANEL WITH GFR
ALT: 13 U/L (ref 0–35)
AST: 28 U/L (ref 0–37)
Albumin: 4.6 g/dL (ref 3.5–5.2)
Alkaline Phosphatase: 99 U/L (ref 39–117)
BUN: 22 mg/dL (ref 6–23)
CO2: 32 meq/L (ref 19–32)
Calcium: 11.1 mg/dL — ABNORMAL HIGH (ref 8.4–10.5)
Chloride: 93 meq/L — ABNORMAL LOW (ref 96–112)
Creatinine, Ser: 1.06 mg/dL (ref 0.40–1.20)
GFR: 47.34 mL/min — ABNORMAL LOW (ref >60.00)
Glucose, Bld: 99 mg/dL (ref 70–99)
Potassium: 3.7 meq/L (ref 3.5–5.1)
Sodium: 134 meq/L — ABNORMAL LOW (ref 135–145)
Total Bilirubin: 0.8 mg/dL (ref 0.2–1.2)
Total Protein: 7.3 g/dL (ref 6.0–8.3)

## 2023-05-03 LAB — LIPID PANEL
Cholesterol: 151 mg/dL (ref 0–200)
HDL: 65.7 mg/dL (ref >39.00)
LDL Cholesterol: 72 mg/dL (ref 0–99)
NonHDL: 85.21
Total CHOL/HDL Ratio: 2
Triglycerides: 68 mg/dL (ref 0.0–149.0)
VLDL: 13.6 mg/dL (ref 0.0–40.0)

## 2023-05-03 LAB — CBC WITH DIFFERENTIAL/PLATELET
Basophils Absolute: 0 10*3/uL (ref 0.0–0.1)
Basophils Relative: 0.3 % (ref 0.0–3.0)
Eosinophils Absolute: 0 10*3/uL (ref 0.0–0.7)
Eosinophils Relative: 0.4 % (ref 0.0–5.0)
HCT: 38.5 % (ref 36.0–46.0)
Hemoglobin: 12.9 g/dL (ref 12.0–15.0)
Lymphocytes Relative: 8.6 % — ABNORMAL LOW (ref 12.0–46.0)
Lymphs Abs: 0.8 10*3/uL (ref 0.7–4.0)
MCHC: 33.6 g/dL (ref 30.0–36.0)
MCV: 86.8 fl (ref 78.0–100.0)
Monocytes Absolute: 0.5 10*3/uL (ref 0.1–1.0)
Monocytes Relative: 5.4 % (ref 3.0–12.0)
Neutro Abs: 8 10*3/uL — ABNORMAL HIGH (ref 1.4–7.7)
Neutrophils Relative %: 85.3 % — ABNORMAL HIGH (ref 43.0–77.0)
Platelets: 257 10*3/uL (ref 150.0–400.0)
RBC: 4.44 Mil/uL (ref 3.87–5.11)
RDW: 13.2 % (ref 11.5–15.5)
WBC: 9.3 10*3/uL (ref 4.0–10.5)

## 2023-05-03 LAB — HEMOGLOBIN A1C: Hgb A1c MFr Bld: 5.8 % (ref 4.6–6.5)

## 2023-05-03 MED ORDER — TELMISARTAN 20 MG PO TABS
20.0000 mg | ORAL_TABLET | Freq: Every day | ORAL | 1 refills | Status: DC
Start: 2023-05-03 — End: 2023-10-18

## 2023-05-03 MED ORDER — FAMOTIDINE 40 MG PO TABS: 40.0000 mg | ORAL_TABLET | Freq: Every day | ORAL | 1 refills | Status: DC

## 2023-05-03 NOTE — Assessment & Plan Note (Signed)
 Chronic Pain is severe and does limit her significantly-she is very sedentary She feels the pain overall is stable Pain manageable with tramadol Not having any side effects-denies constipation She is taking the medication appropriately Continue tramadol 50 mg every 8 hours as needed PDMP checked

## 2023-05-03 NOTE — Assessment & Plan Note (Signed)
 Chronic Lab Results  Component Value Date   HGBA1C 5.8 05/03/2023   Check a1c Low sugar / carb diet Stressed regular exercise

## 2023-05-03 NOTE — Assessment & Plan Note (Signed)
 Chronic GERD improved - controlled Continue pepcid to 40 mg  daily

## 2023-05-03 NOTE — Assessment & Plan Note (Signed)
 Chronic BP controlled She denies any lightheadedness Encouraged her to monitor BP at home, especially if she has some lightheadedness Continue amlodipine 5 mg daily, telmisartan 20 mg daily cmp, CBC

## 2023-05-03 NOTE — Assessment & Plan Note (Signed)
Chronic Regular exercise and healthy diet encouraged Check lipid panel, CMP Continue atorvastatin 20 mg daily 

## 2023-05-03 NOTE — Assessment & Plan Note (Signed)
 Chronic Generalized Stop etodolac 400 mg daily for now due to uncontrolled GERD and decreased GFR Continue tramadol 50 mg 3 times a day as needed

## 2023-05-03 NOTE — Assessment & Plan Note (Signed)
 Chronic Stable Elevate legs when sitting Continue to work on decreasing sodium intake-she has improved her diet CMP Continue Lasix 40 mg in the morning and 20 mg in the afternoon

## 2023-05-03 NOTE — Assessment & Plan Note (Signed)
 Chronic stopped etodolac at last visit Decrease salt intake cmp

## 2023-05-18 ENCOUNTER — Other Ambulatory Visit: Payer: Self-pay | Admitting: Internal Medicine

## 2023-05-25 ENCOUNTER — Ambulatory Visit

## 2023-05-25 VITALS — Ht 59.0 in | Wt 139.0 lb

## 2023-05-25 DIAGNOSIS — Z0000 Encounter for general adult medical examination without abnormal findings: Secondary | ICD-10-CM | POA: Diagnosis not present

## 2023-05-25 NOTE — Progress Notes (Signed)
 Subjective:   Anna Adams is a 89 y.o. who presents for a Medicare Wellness preventive visit.  Visit Complete: Virtual I connected with  Anna Adams on 05/25/23 by a audio enabled telemedicine application and verified that I am speaking with the correct person using two identifiers.  Patient Location: Home  Provider Location: Home Office  I discussed the limitations of evaluation and management by telemedicine. The patient expressed understanding and agreed to proceed.  Vital Signs: Because this visit was a virtual/telehealth visit, some criteria may be missing or patient reported. Any vitals not documented were not able to be obtained and vitals that have been documented are patient reported.  VideoDeclined- This patient declined Librarian, academic. Therefore the visit was completed with audio only.  Persons Participating in Visit: Patient.  AWV Questionnaire: No: Patient Medicare AWV questionnaire was not completed prior to this visit.  Cardiac Risk Factors include: advanced age (>67men, >47 women);hypertension;Other (see comment);dyslipidemia, Risk factor comments: CKD stage 3     Objective:    Today's Vitals   05/25/23 1452  Weight: 139 lb (63 kg)  Height: 4\' 11"  (1.499 m)  PainSc: 5    Body mass index is 28.07 kg/m.     05/25/2023    3:10 PM 05/24/2022    2:58 PM 03/16/2021    2:28 PM 09/22/2016    1:57 PM 07/17/2015    3:38 PM  Advanced Directives  Does Patient Have a Medical Advance Directive? Yes Yes Yes No No  Type of Estate agent of [place];Living will Healthcare Power of [place];Living will Living will;Healthcare Power of Attorney    Does patient want to make changes to medical advance directive?   No - Patient declined    Copy of Healthcare Power of Attorney in Chart? No - copy requested No - copy requested No - copy requested    Would patient like information on creating a medical advance directive?    Yes  (ED - Information included in AVS) No - patient declined information    Current Medications (verified) Outpatient Encounter Medications as of 05/25/2023  Medication Sig   atorvastatin  (LIPITOR) 20 MG tablet TAKE ONE TABLET BY MOUTH AT BEDTIME   b complex vitamins tablet Take 1 tablet by mouth daily.   Biotin [address] MCG TABS See admin instructions.   cholecalciferol (VITAMIN D ) 1000 UNITS tablet Take 400 Units by mouth daily.   famotidine  (PEPCID ) 40 MG tablet Take 1 tablet (40 mg total) by mouth daily.   furosemide  (LASIX ) 20 MG tablet Take 1 tablet by mouth daily in the afternoon   furosemide  (LASIX ) 40 MG tablet TAKE ONE TABLET (40MG  TOTAL) BY MOUTH IN THE MORNING (Patient taking differently: Patient takes 40 mg and then an additional 20 mg daily)   hydrocortisone -pramoxine (ANALPRAM  HC) 2.5-1 % rectal cream Place 1 application rectally 3 (three) times daily.   latanoprost (XALATAN) 0.005 % ophthalmic solution    levothyroxine  (SYNTHROID ) 125 MCG tablet NEW PRESCRIPTION REQUEST: TAKE ONE TABLET BY MOUTH DAILY   potassium chloride  SA (KLOR-CON  M) 20 MEQ tablet Take 1 tablet (20 mEq total) by mouth daily.   Pramoxine-HC (HYDROCORTISONE  ACE-PRAMOXINE) 2.5-1 % CREA Place rectally.   PROVENTIL  HFA 108 (90 Base) MCG/ACT inhaler INHALE 1-2 PUFFS INTO THE LUNGS EVERY 6 (SIX) HOURS AS NEEDED FOR WHEEZING.   telmisartan  (MICARDIS ) 20 MG tablet Take 1 tablet (20 mg total) by mouth daily.   traMADol  (ULTRAM ) 50 MG tablet TAKE 1 TABLET BY  MOUTH EVERY 8 HOURS   UNABLE TO FIND at bedtime. Osteo-ease   vitamin E 200 UNIT capsule Take 200 Units by mouth daily.   zoledronic  acid (RECLAST ) 5 MG/100ML SOLN injection See admin instructions.   No facility-administered encounter medications on file as of 05/25/2023.    Allergies (verified) Contrast media [iodinated contrast media], Dyphylline-guaifenesin, Erythromycin, Gabapentin , Mevacor [lovastatin], and Other   History: Past Medical History:  Diagnosis  Date   ALLERGIC RHINITIS    Anemia    Asthma    Asthma    Cataract    GERD (gastroesophageal reflux disease)    with HH   History of renal calculi    Hypertension    Hypothyroidism    Osteoarthritis    Parotid tumor    L side, s/p resection 1980 and 2007   Past Surgical History:  Procedure Laterality Date   CARDIOVASCULAR STRESS TEST  07/12/2005   EF 85%   CERVICAL DISCECTOMY  1993   GANGLION CYST EXCISION  1954 & 1963   (L) wrist x's 2   KNEE ARTHROPLASTY  04/22/2006   DR. Dwane Gitelman CAFFREY   Left knee replacement  2005   LUMBAR DISC SURGERY     PAROTID GLAND TUMOR EXCISION  1980, 2007   Left   right knee replacement  2008   TONSILLECTOMY AND ADENOIDECTOMY     TUBAL LIGATION     Family History  Problem Relation Age of Onset   Asthma Mother    Cancer Mother    Heart disease Mother    Hypertension Father    Heart disease Father    Hyperlipidemia Father    Stroke Father    Mental illness Father    Stroke Maternal Grandmother    Heart disease Maternal Grandfather    Hypertension Paternal Grandfather    Social History   Socioeconomic History   Marital status: Widowed    Spouse name: Not on file   Number of children: 2   Years of education: Not on file   Highest education level: Associate degree: academic program  Occupational History   Not on file  Tobacco Use   Smoking status: Former    Current packs/day: 0.00    Types: Cigarettes    Quit date: 09/01/1962    Years since quitting: 60.7   Smokeless tobacco: Never  Vaping Use   Vaping status: Never Used  Substance and Sexual Activity   Alcohol use: Not Currently    Comment: occasionally   Drug use: No   Sexual activity: Not on file  Other Topics Concern   Not on file  Social History Narrative   Recently widowed, lives with her dogs, retired Charity fundraiser   Right handed   Caffeine: up to 4-5 cups/day (coffee and tea)   Social Drivers of Corporate investment banker Strain: Low Risk  (05/25/2023)   Overall  Financial Resource Strain (CARDIA)    Difficulty of Paying Living Expenses: Not hard at all  Food Insecurity: No Food Insecurity (05/25/2023)   Hunger Vital Sign    Worried About Running Out of Food in the Last Year: Never true    Ran Out of Food in the Last Year: Never true  Transportation Needs: No Transportation Needs (05/25/2023)   PRAPARE - Administrator, Civil Service (Medical): No    Lack of Transportation (Non-Medical): No  Physical Activity: Inactive (05/25/2023)   Exercise Vital Sign    Days of Exercise per Week: 0 days  Minutes of Exercise per Session: 0 min  Stress: No Stress Concern Present (05/25/2023)   [employer] of Occupational Health - Occupational Stress Questionnaire    Feeling of Stress : Not at all  Social Connections: Socially Isolated (05/25/2023)   Social Connection and Isolation Panel [NHANES]    Frequency of Communication with Friends and Family: More than three times a week    Frequency of Social Gatherings with Friends and Family: More than three times a week    Attends Religious Services: Never    Database administrator or Organizations: No    Attends Banker Meetings: Never    Marital Status: Widowed    Tobacco Counseling Counseling given: Not Answered    Clinical Intake:  Pre-visit preparation completed: Yes  Pain : 0-10 Pain Score: 5  Pain Location: Back Pain Descriptors / Indicators: Constant, Aching Pain Onset: More than a month ago Pain Frequency: Constant Pain Relieving Factors: Tylenol, Tramadol   Pain Relieving Factors: Tylenol, Tramadol   BMI - recorded: 28.07 Nutritional Status: BMI 25 -29 Overweight Nutritional Risks: None Diabetes: No  Lab Results  Component Value Date   HGBA1C 5.8 05/03/2023   HGBA1C 5.4 11/22/2022   HGBA1C 5.7 11/18/2021     How often do you need to have someone help you when you read instructions, pamphlets, or other written materials from your doctor or pharmacy?: 1 -  Never  Interpreter Needed?: No  Information entered by :: Raiford Fetterman, RMA   Activities of Daily Living     05/25/2023    2:53 PM  In your present state of health, do you have any difficulty performing the following activities:  Hearing? 1  Comment has hearing aides but does not wear them-per pt  Vision? 0  Difficulty concentrating or making decisions? 0  Walking or climbing stairs? 0  Dressing or bathing? 0  Doing errands, shopping? 0  Comment son drives her  Preparing Food and eating ? N  Using the Toilet? N  In the past six months, have you accidently leaked urine? N  Do you have problems with loss of bowel control? N  Managing your Medications? N  Managing your Finances? N  Housekeeping or managing your Housekeeping? N    Patient Care Team: Colene Dauphin, MD as PCP - General (Internal Medicine) Driscilla George, MD (Cardiology) Oris Birmingham, MD (Ophthalmology) Velta Gianotti, DPM (Inactive) (Podiatry) Marlena Sima, MD (Orthopedic Surgery) Clance, Deena Farrier, MD (Pulmonary Disease) Jolinda Necessary, MD (Inactive) (Gastroenterology) Alphonso Jean, MD (Inactive) as Consulting Physician (Orthopedic Surgery) Chancey Combe, [facility] (Inactive) as Pharmacist (Pharmacist) Dema Filler, MD as Consulting Physician (Ophthalmology)  Indicate any recent Medical Services you may have received from other than [facility] providers in the past year (date may be approximate).     Assessment:   This is a routine wellness examination for Anna Adams.  Hearing/Vision screen Hearing Screening - Comments:: Has hearing aides but does not wear them per pt Vision Screening - Comments:: Wears eyeglasses   Goals Addressed   None    Depression Screen NO CHANGES SINCE 05/03/23    05/03/2023    3:48 PM 05/24/2022    2:56 PM 03/16/2021    2:34 PM 10/16/2018    1:57 PM 09/22/2016    1:58 PM 09/22/2015    1:30 PM 05/02/2015   12:58 PM  PHQ 2/9 Scores  PHQ - 2 Score 1 0 0 0 0 0 0  PHQ- 9  Score 7    0  Fall Risk     05/25/2023    3:11 PM 05/03/2023    3:48 PM 05/24/2022    3:01 PM 05/24/2022    3:00 PM 03/16/2021    2:30 PM  Fall Risk   Falls in the past year? 0 1 1 1 1   Number falls in past yr: 0 0 0 0 0  Comment   fell in the bathroom    Injury with Fall? 0 0 0 0 0  Risk for fall due to : History of fall(s) No Fall Risks;Impaired balance/gait History of fall(s);Impaired balance/gait;Orthopedic patient No Fall Risks Impaired balance/gait  Follow up Falls prevention discussed;Falls evaluation completed Falls evaluation completed  Falls prevention discussed Falls evaluation completed    MEDICARE RISK AT HOME:  Medicare Risk at Home Any stairs in or around the home?: Yes (a couple in front and back) If so, are there any without handrails?: Yes Home free of loose throw rugs in walkways, pet beds, electrical cords, etc?: Yes Adequate lighting in your home to reduce risk of falls?: Yes Life alert?: Yes (at night sometimes-per pt) Use of a cane, walker or w/c?: Yes (walker) Grab bars in the bathroom?: Yes Shower chair or bench in shower?: Yes Elevated toilet seat or a handicapped toilet?: Yes  TIMED UP AND GO:  Was the test performed?  No  Cognitive Function: 6CIT completed    09/22/2016    4:47 PM  MMSE - Mini Mental State Exam  Orientation to time 5  Orientation to Place 5  Registration 3  Attention/ Calculation 4  Recall 3  Language- name 2 objects 2  Language- repeat 1  Language- follow 3 step command 3  Language- read & follow direction 1  Write a sentence 1  Copy design 1  Total score 29        05/25/2023    3:13 PM 05/24/2022    3:03 PM  6CIT Screen  What Year? 0 points 0 points  What month? 0 points 0 points  What time? 0 points 0 points  Count back from 20 0 points 0 points  Months in reverse 0 points 0 points  Repeat phrase 0 points 0 points  Total Score 0 points 0 points    Immunizations Immunization History  Administered Date(s)  Administered   Fluad Quad(high Dose 65+) 10/16/2018, 10/23/2019, 12/01/2021   Hepatitis B, ADULT 08/01/2018   Influenza Whole 11/08/2013   Influenza, High Dose Seasonal PF 10/12/2017, 10/27/2020   Influenza,inj,Quad PF,6+ Mos 10/25/2012   Influenza-Unspecified 10/26/2011, 11/04/2014, 11/04/2015, 10/25/2016   PFIZER Comirnaty(Gray Top)Covid-19 Tri-Sucrose Vaccine 06/03/2020   PFIZER(Purple Top)SARS-COV-2 Vaccination 03/22/2019, 04/17/2019   Pneumococcal Conjugate-13 11/26/2014   Pneumococcal Polysaccharide-23 01/25/2005, 08/01/2018   Td 01/25/2006   Tdap 09/29/2016   Zoster, Live 01/25/2009    Screening Tests Health Maintenance  Topic Date Due   Zoster Vaccines- Shingrix (1 of 2) 02/18/1953   COVID-19 Vaccine (4 - 2024-25 season) 09/26/2022   Medicare Annual Wellness (AWV)  05/24/2023   DEXA SCAN  11/22/2023 (Originally 10/02/2022)   INFLUENZA VACCINE  08/26/2023   DTaP/Tdap/Td (3 - Td or Tdap) 09/30/2026   Pneumonia Vaccine 31+ Years old  Completed   HPV VACCINES  Aged Out   Meningococcal B Vaccine  Aged Out    Health Maintenance  Health Maintenance Due  Topic Date Due   Zoster Vaccines- Shingrix (1 of 2) 02/18/1953   COVID-19 Vaccine (4 - 2024-25 season) 09/26/2022   Medicare Annual Wellness (AWV)  05/24/2023  Health Maintenance Items Addressed: See Nurse Notes  Additional Screening:  Vision Screening: Recommended annual ophthalmology exams for early detection of glaucoma and other disorders of the eye.  Dental Screening: Recommended annual dental exams for proper oral hygiene  Community Resource Referral / Chronic Care Management: CRR required this visit?  No   CCM required this visit?  No     Plan:     I have personally reviewed and noted the following in the patient's chart:   Medical and social history Use of alcohol, tobacco or illicit drugs  Current medications and supplements including opioid prescriptions. Patient is currently taking opioid  prescriptions. Information provided to patient regarding non-opioid alternatives. Patient advised to discuss non-opioid treatment plan with their provider. Functional ability and status Nutritional status Physical activity Advanced directives List of other physicians Hospitalizations, surgeries, and ER visits in previous 12 months Vitals Screenings to include cognitive, depression, and falls Referrals and appointments  In addition, I have reviewed and discussed with patient certain preventive protocols, quality metrics, and best practice recommendations. A written personalized care plan for preventive services as well as general preventive health recommendations were provided to patient.     Anna Adams, CMA   05/25/2023   After Visit Summary: (MyChart) Due to this being a telephonic visit, the after visit summary with patients personalized plan was offered to patient via MyChart   Notes: Please refer to Routing Comments.

## 2023-05-25 NOTE — Patient Instructions (Signed)
 Anna Adams , Thank you for taking time to come for your Medicare Wellness Visit. I appreciate your ongoing commitment to your health goals. Please review the following plan we discussed and let me know if I can assist you in the future.   Referrals/Orders/Follow-Ups/Clinician Recommendations: It was nice talking with today.  You are due for a Shingles vaccine and can get this at your pharmacy.   Each day, aim for 6 glasses of water, plenty of protein in your diet and try to get up and walk/ stretch every hour for 5-10 minutes at a time.  You have an order for:  [x]   Bone Density     Please call for appointment:  [facility] [address] [place], [place] [address] [phone]  Make sure to wear two-piece clothing.  No lotions, powders, or deodorants the day of the appointment. Make sure to bring picture ID and insurance card.  Bring list of medications you are currently taking including any supplements.    This is a list of the screening recommended for you and due dates:  Health Maintenance  Topic Date Due   Zoster (Shingles) Vaccine (1 of 2) 02/18/1953   COVID-19 Vaccine (4 - 2024-25 season) 09/26/2022   Medicare Annual Wellness Visit  05/24/2023   DEXA scan (bone density measurement)  11/22/2023*   Flu Shot  08/26/2023   DTaP/Tdap/Td vaccine (3 - Td or Tdap) 09/30/2026   Pneumonia Vaccine  Completed   HPV Vaccine  Aged Out   Meningitis B Vaccine  Aged Out  *Topic was postponed. The date shown is not the original due date.    Advanced directives: (Copy Requested) Please bring a copy of your health care power of attorney and living will to the office to be added to your chart at your convenience. You can mail to [facility] [address] [place], [place] [address] or email to [email or web address]  Next Medicare Annual Wellness Visit scheduled for next year: Yes

## 2023-06-18 ENCOUNTER — Other Ambulatory Visit: Payer: Self-pay | Admitting: Internal Medicine

## 2023-06-30 ENCOUNTER — Telehealth: Payer: Self-pay

## 2023-06-30 ENCOUNTER — Other Ambulatory Visit: Payer: Self-pay | Admitting: Internal Medicine

## 2023-06-30 NOTE — Telephone Encounter (Signed)
 Copied from CRM [id number]. Topic: Clinical - Prescription Issue >> Jun 30, 2023  1:47 PM Chuck Crater wrote: Reason for CRM: Asheton Viramontes stated that patient was previously taking telmisartan  (MICARDIS ) 80 MG tablet and at visit he stated that Dr. Donnette Gal told that him that dosage would be cut in half which would be 40 mg. Patient son stated that PA changed it and the doctor probably didn't know and changed it again to 20mg . They want to make sure that the next prescription will be  telmisartan  (MICARDIS ) 40 MG tablet as talked about instead of 20mg . If clarification is needed please call Mark.

## 2023-07-15 NOTE — Telephone Encounter (Signed)
 When I saw her in the past she was taking 40 mg of telmisartan .  Her home nurse called in a month later and her BP was extremely low and at that time we decreased the telmisartan  to 20 mg daily.  So as far as I know she has actually been on the 20 mg since the end of last year.  Find out what she is taking at home and how her BP has been.  We just want her blood pressure well-controlled.  The last prescription I called in was in April it was for 20 mg.

## 2023-07-18 ENCOUNTER — Other Ambulatory Visit: Payer: Self-pay | Admitting: Internal Medicine

## 2023-07-19 NOTE — Telephone Encounter (Signed)
 Message left for patient today to return call to clinic to discuss telmisartan .

## 2023-08-04 NOTE — Telephone Encounter (Signed)
Attempted to reach patient again today.

## 2023-08-25 DIAGNOSIS — M810 Age-related osteoporosis without current pathological fracture: Secondary | ICD-10-CM | POA: Diagnosis not present

## 2023-10-17 ENCOUNTER — Other Ambulatory Visit: Payer: Self-pay | Admitting: Internal Medicine

## 2023-10-18 NOTE — Telephone Encounter (Signed)
 Just filled

## 2023-10-19 ENCOUNTER — Other Ambulatory Visit: Payer: Self-pay | Admitting: Internal Medicine

## 2023-10-24 ENCOUNTER — Emergency Department (HOSPITAL_COMMUNITY)

## 2023-10-24 ENCOUNTER — Encounter (HOSPITAL_COMMUNITY): Payer: Self-pay

## 2023-10-24 ENCOUNTER — Other Ambulatory Visit: Payer: Self-pay

## 2023-10-24 ENCOUNTER — Emergency Department (HOSPITAL_COMMUNITY)
Admission: EM | Admit: 2023-10-24 | Discharge: 2023-10-24 | Disposition: A | Discharge status: Home / Self Care | Attending: Emergency Medicine | Admitting: Emergency Medicine

## 2023-10-24 DIAGNOSIS — N3000 Acute cystitis without hematuria: Secondary | ICD-10-CM | POA: Insufficient documentation

## 2023-10-24 DIAGNOSIS — E871 Hypo-osmolality and hyponatremia: Secondary | ICD-10-CM | POA: Insufficient documentation

## 2023-10-24 DIAGNOSIS — Z79899 Other long term (current) drug therapy: Secondary | ICD-10-CM | POA: Insufficient documentation

## 2023-10-24 DIAGNOSIS — I4430 Unspecified atrioventricular block: Secondary | ICD-10-CM | POA: Diagnosis not present

## 2023-10-24 DIAGNOSIS — M7989 Other specified soft tissue disorders: Secondary | ICD-10-CM | POA: Diagnosis present

## 2023-10-24 DIAGNOSIS — N183 Chronic kidney disease, stage 3 unspecified: Secondary | ICD-10-CM | POA: Diagnosis not present

## 2023-10-24 DIAGNOSIS — I129 Hypertensive chronic kidney disease with stage 1 through stage 4 chronic kidney disease, or unspecified chronic kidney disease: Secondary | ICD-10-CM | POA: Insufficient documentation

## 2023-10-24 DIAGNOSIS — R531 Weakness: Secondary | ICD-10-CM | POA: Diagnosis not present

## 2023-10-24 LAB — CBC WITH DIFFERENTIAL/PLATELET
Abs Immature Granulocytes: 0.02 K/uL (ref 0.00–0.07)
Basophils Absolute: 0.1 K/uL (ref 0.0–0.1)
Basophils Relative: 1 %
Eosinophils Absolute: 0.1 K/uL (ref 0.0–0.5)
Eosinophils Relative: 2 %
HCT: 35.8 % — ABNORMAL LOW (ref 36.0–46.0)
Hemoglobin: 11.3 g/dL — ABNORMAL LOW (ref 12.0–15.0)
Immature Granulocytes: 0 %
Lymphocytes Relative: 17 %
Lymphs Abs: 1.3 K/uL (ref 0.7–4.0)
MCH: 29 pg (ref 26.0–34.0)
MCHC: 31.6 g/dL (ref 30.0–36.0)
MCV: 91.8 fL (ref 80.0–100.0)
Monocytes Absolute: 0.7 K/uL (ref 0.1–1.0)
Monocytes Relative: 10 %
Neutro Abs: 5.2 K/uL (ref 1.7–7.7)
Neutrophils Relative %: 70 %
Platelets: 258 K/uL (ref 150–400)
RBC: 3.9 MIL/uL (ref 3.87–5.11)
RDW: 13.7 % (ref 11.5–15.5)
WBC: 7.4 K/uL (ref 4.0–10.5)
nRBC: 0 % (ref 0.0–0.2)

## 2023-10-24 LAB — RESP PANEL BY RT-PCR (RSV, FLU A&B, COVID)  RVPGX2
Influenza A by PCR: NEGATIVE
Influenza B by PCR: NEGATIVE
Resp Syncytial Virus by PCR: NEGATIVE
SARS Coronavirus 2 by RT PCR: NEGATIVE

## 2023-10-24 LAB — URINALYSIS, ROUTINE W REFLEX MICROSCOPIC
Bilirubin Urine: NEGATIVE
Glucose, UA: NEGATIVE mg/dL
Hgb urine dipstick: NEGATIVE
Ketones, ur: NEGATIVE mg/dL
Nitrite: NEGATIVE
Protein, ur: 30 mg/dL — AB
Specific Gravity, Urine: 1.017 (ref 1.005–1.030)
pH: 5 (ref 5.0–8.0)

## 2023-10-24 LAB — COMPREHENSIVE METABOLIC PANEL WITH GFR
ALT: 18 U/L (ref 0–44)
AST: 38 U/L (ref 15–41)
Albumin: 3.1 g/dL — ABNORMAL LOW (ref 3.5–5.0)
Alkaline Phosphatase: 76 U/L (ref 38–126)
Anion gap: 10 (ref 5–15)
BUN: 17 mg/dL (ref 8–23)
CO2: 27 mmol/L (ref 22–32)
Calcium: 9.6 mg/dL (ref 8.9–10.3)
Chloride: 94 mmol/L — ABNORMAL LOW (ref 98–111)
Creatinine, Ser: 1.21 mg/dL — ABNORMAL HIGH (ref 0.44–1.00)
GFR, Estimated: 43 mL/min — ABNORMAL LOW (ref >60)
Glucose, Bld: 87 mg/dL (ref 70–99)
Potassium: 3.9 mmol/L (ref 3.5–5.1)
Sodium: 131 mmol/L — ABNORMAL LOW (ref 135–145)
Total Bilirubin: 0.6 mg/dL (ref 0.0–1.2)
Total Protein: 5.9 g/dL — ABNORMAL LOW (ref 6.5–8.1)

## 2023-10-24 MED ORDER — SODIUM CHLORIDE 0.9 % IV SOLN
1.0000 g | Freq: Once | INTRAVENOUS | Status: AC
  Administered 2023-10-24: 1 g via INTRAVENOUS
  Filled 2023-10-24: qty 10

## 2023-10-24 MED ORDER — SODIUM CHLORIDE 0.9 % IV BOLUS
1000.0000 mL | Freq: Once | INTRAVENOUS | Status: AC
  Administered 2023-10-24: 1000 mL via INTRAVENOUS

## 2023-10-24 MED ORDER — TRAMADOL HCL 50 MG PO TABS
50.0000 mg | ORAL_TABLET | Freq: Once | ORAL | Status: AC
  Administered 2023-10-24: 50 mg via ORAL
  Filled 2023-10-24: qty 1

## 2023-10-24 MED ORDER — CEPHALEXIN 500 MG PO CAPS: 500.0000 mg | ORAL_CAPSULE | Freq: Three times a day (TID) | ORAL | 0 refills | Status: DC

## 2023-10-24 NOTE — ED Provider Notes (Signed)
  Physical Exam  BP 135/69   Pulse 74   Temp 98.6 F (37 C) (Oral)   Resp 16   SpO2 96%   Physical Exam  Procedures  Procedures  ED Course / MDM   Clinical Course as of 10/24/23 1831  Mon Oct 24, 2023  1534 Initial laboratory workup notable for slight elevation in creatinine.  Hyponatremia mild of 131.  Chest x-ray clear.  No leukocytosis or severe anemia.  LILLETTE Ozell Marine DO, am transitioning care of this patient to the oncoming provider pending EKG, UA , respiratory swab, reevaluation and disposition-  [MP]    Clinical Course User Index [MP] Marine Ozell LABOR, DO   Medical Decision Making Care assumed at 3 PM.  Patient is here with dizziness.  Patient is pending labs and urinalysis  6:31 PM Reviewed patient's labs and sodium is 131.  Patient received IV fluids.  Patient's chest x-ray is clear and UA showed rare bacteria and 11-20 white blood cells.  I suspect patient has UTI causing her symptoms.  Patient given a dose of Rocephin.  Will discharge home with course of Keflex.  Urine culture sent  Problems Addressed: Acute cystitis without hematuria: acute illness or injury Generalized weakness: acute illness or injury  Amount and/or Complexity of Data Reviewed Labs: ordered. Decision-making details documented in ED Course. Radiology: ordered and independent interpretation performed. Decision-making details documented in ED Course.  Risk Prescription drug management.         Patt Alm Macho, MD 10/24/23 [phone]

## 2023-10-24 NOTE — ED Provider Notes (Signed)
 [place] EMERGENCY DEPARTMENT AT [facility] Provider Note   CSN: [id number] Arrival date & time: 10/24/23  1213     Patient presents with: Weakness   Star Resler Hynes is a 89 y.o. female.  With a history of CKD stage III hypertension and asthma who presents to the ED for generalized weakness.  Generalized weakness over the last 3 days.  This transiently improved yesterday but continued again today.  Her son thought it might be due to dehydration and has been making sure she drinks enough fluids at home but this did not seem to help much.  No overt infectious symptoms.  She has had a little more swelling in both legs over the last couple days.  Denies chest pain headaches abdominal pain nausea vomiting and changes in bowel habits.  No urinary symptoms.  No recent falls or trauma.    Weakness      Prior to Admission medications   Medication Sig Start Date End Date Taking? Authorizing Provider  atorvastatin  (LIPITOR) 20 MG tablet TAKE ONE TABLET BY MOUTH AT BEDTIME 05/19/23   Geofm Glade PARAS, MD  b complex vitamins tablet Take 1 tablet by mouth daily.    [provider]  Biotin 89999 MCG TABS See admin instructions.    [provider]  cholecalciferol (VITAMIN D ) 1000 UNITS tablet Take 400 Units by mouth daily.    [provider]  famotidine  (PEPCID ) 40 MG tablet Take 1 tablet (40 mg total) by mouth daily. 05/03/23   Geofm Glade PARAS, MD  furosemide  (LASIX ) 20 MG tablet Take 1 tablet by mouth daily in the afternoon 11/22/22   Geofm Glade PARAS, MD  furosemide  (LASIX ) 40 MG tablet TAKE 1 TABLET BY MOUTH EVERY MORNING 10/19/23   Geofm Glade PARAS, MD  hydrocortisone -pramoxine (ANALPRAM  HC) 2.5-1 % rectal cream Place 1 application rectally 3 (three) times daily. 09/11/16   Tharon Lenis, NP  latanoprost (XALATAN) 0.005 % ophthalmic solution  04/15/19   [provider]  levothyroxine  (SYNTHROID ) 125 MCG tablet TAKE ONE TABLET BY MOUTH DAILY 06/30/23   Geofm Glade PARAS,  MD  potassium chloride  SA (KLOR-CON  M) 20 MEQ tablet Take 1 tablet (20 mEq total) by mouth daily. 07/26/22   Geofm Glade PARAS, MD  Pramoxine-HC (HYDROCORTISONE  ACE-PRAMOXINE) 2.5-1 % CREA Place rectally. 09/11/16   [provider]  PROVENTIL  HFA 108 (90 Base) MCG/ACT inhaler INHALE 1-2 PUFFS INTO THE LUNGS EVERY 6 (SIX) HOURS AS NEEDED FOR WHEEZING. 09/23/22   Burns, Glade PARAS, MD  telmisartan  (MICARDIS ) 20 MG tablet TAKE 1 TABLET BY MOUTH DAILY 10/18/23   Geofm Glade PARAS, MD  traMADol  (ULTRAM ) 50 MG tablet TAKE 1 TABLET BY MOUTH EVERY 8 HOURS 07/19/23   Burns, Glade PARAS, MD  UNABLE TO FIND at bedtime. Osteo-ease    [provider]  vitamin E 200 UNIT capsule Take 200 Units by mouth daily.    [provider]  zoledronic  acid (RECLAST ) 5 MG/100ML SOLN injection See admin instructions. 08/01/18   [provider]    Allergies: Contrast media [iodinated contrast media], Dyphylline-guaifenesin, Erythromycin, Gabapentin , Mevacor [lovastatin], and Other    Review of Systems  Neurological:  Positive for weakness.    Updated Vital Signs BP 135/69   Pulse 74   Temp 98.6 F (37 C) (Oral)   Resp 16   SpO2 96%   Physical Exam Vitals and nursing note reviewed.  HENT:     Head: Normocephalic and atraumatic.  Eyes:  Pupils: Pupils are equal, round, and reactive to light.  Cardiovascular:     Rate and Rhythm: Normal rate and regular rhythm.  Pulmonary:     Effort: Pulmonary effort is normal.     Breath sounds: Normal breath sounds.  Abdominal:     Palpations: Abdomen is soft.     Tenderness: There is no abdominal tenderness.  Skin:    General: Skin is warm and dry.  Neurological:     General: No focal deficit present.     Mental Status: She is alert and oriented to person, place, and time.     Sensory: No sensory deficit.     Motor: No weakness.  Psychiatric:        Mood and Affect: Mood normal.     (all labs ordered are listed, but only abnormal results are  displayed) Labs Reviewed  COMPREHENSIVE METABOLIC PANEL WITH GFR - Abnormal; Notable for the following components:      Result Value   Sodium 131 (*)    Chloride 94 (*)    Creatinine, Ser 1.21 (*)    Total Protein 5.9 (*)    Albumin 3.1 (*)    GFR, Estimated 43 (*)    All other components within normal limits  CBC WITH DIFFERENTIAL/PLATELET - Abnormal; Notable for the following components:   Hemoglobin 11.3 (*)    HCT 35.8 (*)    All other components within normal limits  RESP PANEL BY RT-PCR (RSV, FLU A&B, COVID)  RVPGX2  URINALYSIS, ROUTINE W REFLEX MICROSCOPIC    EKG: None  Radiology: DG Chest 2 View Result Date: 10/24/2023 CLINICAL DATA:  Concern for pneumonia. EXAM: CHEST - 2 VIEW COMPARISON:  Chest [place]  dated 04/29/2015. FINDINGS: No focal consolidation, pleural effusion or pneumothorax. Stable cardiac silhouette. There is a large hiatal hernia with associated compressive atelectasis of the left lung base. No acute osseous pathology. IMPRESSION: 1. No active cardiopulmonary disease. 2. Large hiatal hernia. Electronically Signed   By: Vanetta Chou M.D.   On: 10/24/2023 14:17     Procedures   Medications Ordered in the ED  sodium chloride 0.9 % bolus 1,000 mL (has no administration in time range)    Clinical Course as of 10/24/23 1535  Mon Oct 24, 2023  1534 Initial laboratory workup notable for slight elevation in creatinine.  Hyponatremia mild of 131.  Chest x-ray clear.  No leukocytosis or severe anemia.  LILLETTE Ozell Marine DO, am transitioning care of this patient to the oncoming provider pending EKG, UA , respiratory swab, reevaluation and disposition-  [MP]    Clinical Course User Index [MP] Marine Ozell LABOR, DO                                 Medical Decision Making 89 year old female with history as above presented to the ED for generalized weakness x 3 days.  No overt infectious symptoms.  No focal neurologic deficits.  Benign physical exam.   Unclear cause of her weakness that has made it difficult to walk at home.  Will obtain laboratory workup EKG UA chest x-ray.  Differential diagnosis includes UTI, pneumonia, viral respiratory illness, electrolyte imbalance, anemia or new onset heart failure.  Amount and/or Complexity of Data Reviewed Labs: ordered. Radiology: ordered.        Final diagnoses:  Generalized weakness    ED Discharge Orders     None  Pamella Ozell LABOR, DO 10/24/23 1535

## 2023-10-24 NOTE — Discharge Instructions (Signed)
 Take Keflex 500 mg 3 times daily for a week for urinary tract infection  I think your urinary tract infection is likely causing your lightheadedness and dizziness  Please stay hydrated  See your doctor for follow-up  Return to ER if you have worse dizziness or falling or fever or vomiting

## 2023-10-24 NOTE — ED Triage Notes (Signed)
 Patient BIB EMS from home c/o generalized weakness. Son states that this started about 10 AM yesterday morning and noticed that she was weak and having difficulty getting up and moving around. Son states that he took a blood pressure at home yesterday and noted that it was hypotensive and looked it up online on treatment and made sure he gave her a lot of fluids and noticed little improvement. Patient also states there is slightly more swelling in her legs than normal, patient does take lasix .

## 2023-10-25 LAB — URINE CULTURE: Culture: <10000 — AB

## 2023-11-01 NOTE — Patient Instructions (Addendum)
       Medications changes include :   decrease telmisartan  to 10 mg daily   A referral for a Child psychotherapist was ordered.     Return in about 6 months (around 05/02/2024) for follow up.

## 2023-11-01 NOTE — Progress Notes (Unsigned)
 Subjective:    Patient ID: Anna Adams, female    DOB: 02/13/1934, 89 y.o.   MRN: [id number]     HPI Anna Adams is here for follow up of her chronic medical problems.  N olabs  Medications and allergies reviewed with patient and updated if appropriate.  Current Outpatient Medications on File Prior to Visit  Medication Sig Dispense Refill   atorvastatin  (LIPITOR) 20 MG tablet TAKE ONE TABLET BY MOUTH AT BEDTIME 90 tablet 11   b complex vitamins tablet Take 1 tablet by mouth daily.     Biotin 89999 MCG TABS See admin instructions.     cephALEXin (KEFLEX) 500 MG capsule Take 1 capsule (500 mg total) by mouth 3 (three) times daily. 21 capsule 0   cholecalciferol (VITAMIN D ) 1000 UNITS tablet Take 400 Units by mouth daily.     famotidine  (PEPCID ) 40 MG tablet Take 1 tablet (40 mg total) by mouth daily. 90 tablet 1   furosemide  (LASIX ) 20 MG tablet Take 1 tablet by mouth daily in the afternoon     furosemide  (LASIX ) 40 MG tablet TAKE 1 TABLET BY MOUTH EVERY MORNING 30 tablet 11   hydrocortisone -pramoxine (ANALPRAM  HC) 2.5-1 % rectal cream Place 1 application rectally 3 (three) times daily. 30 g 0   latanoprost (XALATAN) 0.005 % ophthalmic solution      levothyroxine  (SYNTHROID ) 125 MCG tablet TAKE ONE TABLET BY MOUTH DAILY 90 tablet 11   potassium chloride  SA (KLOR-CON  M) 20 MEQ tablet Take 1 tablet (20 mEq total) by mouth daily. 30 tablet 5   Pramoxine-HC (HYDROCORTISONE  ACE-PRAMOXINE) 2.5-1 % CREA Place rectally.     PROVENTIL  HFA 108 (90 Base) MCG/ACT inhaler INHALE 1-2 PUFFS INTO THE LUNGS EVERY 6 (SIX) HOURS AS NEEDED FOR WHEEZING. 6.7 each 7   telmisartan  (MICARDIS ) 20 MG tablet TAKE 1 TABLET BY MOUTH DAILY 90 tablet 11   traMADol  (ULTRAM ) 50 MG tablet TAKE 1 TABLET BY MOUTH EVERY 8 HOURS 90 tablet 2   UNABLE TO FIND at bedtime. Osteo-ease     vitamin E 200 UNIT capsule Take 200 Units by mouth daily.     zoledronic  acid (RECLAST ) 5 MG/100ML SOLN injection See admin  instructions.     No current facility-administered medications on file prior to visit.     Review of Systems     Objective:  There were no vitals filed for this visit. BP Readings from Last 3 Encounters:  10/24/23 139/68  05/03/23 114/72  11/22/22 108/74   Wt Readings from Last 3 Encounters:  05/25/23 139 lb (63 kg)  05/03/23 139 lb (63 kg)  11/22/22 145 lb (65.8 kg)   There is no height or weight on file to calculate BMI.    Physical Exam     Lab Results  Component Value Date   WBC 7.4 10/24/2023   HGB 11.3 (L) 10/24/2023   HCT 35.8 (L) 10/24/2023   PLT 258 10/24/2023   GLUCOSE 87 10/24/2023   CHOL 151 05/03/2023   TRIG 68.0 05/03/2023   HDL 65.70 05/03/2023   LDLCALC 72 05/03/2023   ALT 18 10/24/2023   AST 38 10/24/2023   NA 131 (L) 10/24/2023   K 3.9 10/24/2023   CL 94 (L) 10/24/2023   CREATININE 1.21 (H) 10/24/2023   BUN 17 10/24/2023   CO2 27 10/24/2023   TSH 3.26 05/20/2020   HGBA1C 5.8 05/03/2023     Assessment & Plan:    See Problem List for Assessment  and Plan of chronic medical problems.

## 2023-11-02 ENCOUNTER — Ambulatory Visit (INDEPENDENT_AMBULATORY_CARE_PROVIDER_SITE_OTHER): Admitting: Internal Medicine

## 2023-11-02 ENCOUNTER — Encounter: Payer: Self-pay | Admitting: Internal Medicine

## 2023-11-02 VITALS — BP 116/72 | HR 68 | Temp 98.0°F | Ht 59.0 in | Wt 139.0 lb

## 2023-11-02 DIAGNOSIS — K219 Gastro-esophageal reflux disease without esophagitis: Secondary | ICD-10-CM

## 2023-11-02 DIAGNOSIS — M150 Primary generalized (osteo)arthritis: Secondary | ICD-10-CM

## 2023-11-02 DIAGNOSIS — I10 Essential (primary) hypertension: Secondary | ICD-10-CM

## 2023-11-02 DIAGNOSIS — R600 Localized edema: Secondary | ICD-10-CM | POA: Diagnosis not present

## 2023-11-02 DIAGNOSIS — M48062 Spinal stenosis, lumbar region with neurogenic claudication: Secondary | ICD-10-CM

## 2023-11-02 DIAGNOSIS — N1832 Chronic kidney disease, stage 3b: Secondary | ICD-10-CM

## 2023-11-02 DIAGNOSIS — E785 Hyperlipidemia, unspecified: Secondary | ICD-10-CM

## 2023-11-02 DIAGNOSIS — R7303 Prediabetes: Secondary | ICD-10-CM | POA: Diagnosis not present

## 2023-11-02 DIAGNOSIS — E039 Hypothyroidism, unspecified: Secondary | ICD-10-CM | POA: Diagnosis not present

## 2023-11-02 DIAGNOSIS — R350 Frequency of micturition: Secondary | ICD-10-CM

## 2023-11-02 LAB — URINALYSIS, ROUTINE W REFLEX MICROSCOPIC
Bilirubin Urine: NEGATIVE
Hgb urine dipstick: NEGATIVE
Ketones, ur: NEGATIVE
Nitrite: NEGATIVE
Specific Gravity, Urine: 1.01 (ref 1.000–1.030)
Total Protein, Urine: NEGATIVE
Urine Glucose: NEGATIVE
Urobilinogen, UA: 0.2 (ref 0.0–1.0)
pH: 7 (ref 5.0–8.0)

## 2023-11-02 MED ORDER — TELMISARTAN 20 MG PO TABS: 10.0000 mg | ORAL_TABLET | Freq: Every day | ORAL | Status: DC

## 2023-11-02 NOTE — Assessment & Plan Note (Addendum)
 Chronic Lab Results  Component Value Date   HGBA1C 5.8 05/03/2023   Low sugar / carb diet Stressed regular exercise

## 2023-11-02 NOTE — Assessment & Plan Note (Signed)
 Chronic Generalized Stopped etodolac  400 mg daily at last visit due to uncontrolled GERD and decreased GFR Continue tramadol  50 mg 3 times a day as needed

## 2023-11-02 NOTE — Assessment & Plan Note (Signed)
 Chronic stopped etodolac  at last visit Decrease salt intake Reviewed last CMP-stable

## 2023-11-02 NOTE — Assessment & Plan Note (Signed)
 Chronic GERD controlled Continue pepcid  to 40 mg  daily

## 2023-11-02 NOTE — Assessment & Plan Note (Signed)
 Chronic Pain is severe and does limit her significantly-she is very sedentary She feels the pain overall is stable Pain manageable with tramadol Not having any side effects-denies constipation She is taking the medication appropriately Continue tramadol 50 mg every 8 hours as needed PDMP checked

## 2023-11-02 NOTE — Assessment & Plan Note (Signed)
 Chronic Regular exercise and healthy diet encouraged Lab Results  Component Value Date   LDLCALC 72 05/03/2023    Continue atorvastatin  20 mg daily

## 2023-11-02 NOTE — Assessment & Plan Note (Signed)
Chronic Management per endocrine 

## 2023-11-02 NOTE — Assessment & Plan Note (Addendum)
 Chronic Likely related to chronic venous insufficiency Stable Elevate legs when sitting She does not walk much during the day which she is not helping with the swelling She does sleep in bed at night Continue to work on decreasing sodium intake-she has improved her diet Reviewed most recent CMP Continue Lasix  40 mg in the morning and 20 mg in the afternoon

## 2023-11-02 NOTE — Assessment & Plan Note (Addendum)
 Chronic BP controlled, but may be slightly low for her She had recent episode of generalized weakness-could have been a UTI, but her BP was also low when EMS got there and she may be having hypotension She denies any lightheadedness Monitor BP at home if you are able Decrease telmisartan  to 10 mg daily

## 2023-11-04 ENCOUNTER — Emergency Department (HOSPITAL_COMMUNITY)

## 2023-11-04 ENCOUNTER — Telehealth: Payer: Self-pay | Admitting: *Deleted

## 2023-11-04 ENCOUNTER — Inpatient Hospital Stay (HOSPITAL_COMMUNITY)
Admission: EM | Admit: 2023-11-04 | Discharge: 2023-11-26 | DRG: 689 | Disposition: E | Attending: Internal Medicine | Admitting: Internal Medicine

## 2023-11-04 ENCOUNTER — Ambulatory Visit: Payer: Self-pay | Admitting: Internal Medicine

## 2023-11-04 ENCOUNTER — Encounter (HOSPITAL_COMMUNITY): Payer: Self-pay | Admitting: *Deleted

## 2023-11-04 ENCOUNTER — Other Ambulatory Visit: Payer: Self-pay

## 2023-11-04 DIAGNOSIS — Z8673 Personal history of transient ischemic attack (TIA), and cerebral infarction without residual deficits: Secondary | ICD-10-CM | POA: Diagnosis not present

## 2023-11-04 DIAGNOSIS — R220 Localized swelling, mass and lump, head: Secondary | ICD-10-CM | POA: Diagnosis not present

## 2023-11-04 DIAGNOSIS — Z515 Encounter for palliative care: Secondary | ICD-10-CM | POA: Diagnosis not present

## 2023-11-04 DIAGNOSIS — W19XXXA Unspecified fall, initial encounter: Secondary | ICD-10-CM | POA: Diagnosis not present

## 2023-11-04 DIAGNOSIS — Z87891 Personal history of nicotine dependence: Secondary | ICD-10-CM

## 2023-11-04 DIAGNOSIS — M7989 Other specified soft tissue disorders: Secondary | ICD-10-CM | POA: Diagnosis not present

## 2023-11-04 DIAGNOSIS — N3000 Acute cystitis without hematuria: Secondary | ICD-10-CM | POA: Diagnosis not present

## 2023-11-04 DIAGNOSIS — E785 Hyperlipidemia, unspecified: Secondary | ICD-10-CM | POA: Diagnosis present

## 2023-11-04 DIAGNOSIS — R29818 Other symptoms and signs involving the nervous system: Secondary | ICD-10-CM | POA: Diagnosis not present

## 2023-11-04 DIAGNOSIS — Z6831 Body mass index (BMI) 31.0-31.9, adult: Secondary | ICD-10-CM

## 2023-11-04 DIAGNOSIS — Y92009 Unspecified place in unspecified non-institutional (private) residence as the place of occurrence of the external cause: Secondary | ICD-10-CM | POA: Diagnosis not present

## 2023-11-04 DIAGNOSIS — W010XXA Fall on same level from slipping, tripping and stumbling without subsequent striking against object, initial encounter: Secondary | ICD-10-CM | POA: Diagnosis present

## 2023-11-04 DIAGNOSIS — R29810 Facial weakness: Secondary | ICD-10-CM | POA: Diagnosis not present

## 2023-11-04 DIAGNOSIS — Z79899 Other long term (current) drug therapy: Secondary | ICD-10-CM

## 2023-11-04 DIAGNOSIS — R55 Syncope and collapse: Secondary | ICD-10-CM | POA: Diagnosis not present

## 2023-11-04 DIAGNOSIS — R531 Weakness: Secondary | ICD-10-CM | POA: Diagnosis not present

## 2023-11-04 DIAGNOSIS — Z96653 Presence of artificial knee joint, bilateral: Secondary | ICD-10-CM | POA: Diagnosis present

## 2023-11-04 DIAGNOSIS — J9601 Acute respiratory failure with hypoxia: Secondary | ICD-10-CM | POA: Diagnosis not present

## 2023-11-04 DIAGNOSIS — N179 Acute kidney failure, unspecified: Secondary | ICD-10-CM | POA: Diagnosis not present

## 2023-11-04 DIAGNOSIS — J9602 Acute respiratory failure with hypercapnia: Secondary | ICD-10-CM | POA: Diagnosis not present

## 2023-11-04 DIAGNOSIS — D649 Anemia, unspecified: Secondary | ICD-10-CM | POA: Diagnosis present

## 2023-11-04 DIAGNOSIS — Z823 Family history of stroke: Secondary | ICD-10-CM

## 2023-11-04 DIAGNOSIS — Z91041 Radiographic dye allergy status: Secondary | ICD-10-CM

## 2023-11-04 DIAGNOSIS — R0600 Dyspnea, unspecified: Secondary | ICD-10-CM | POA: Diagnosis not present

## 2023-11-04 DIAGNOSIS — I471 Supraventricular tachycardia, unspecified: Secondary | ICD-10-CM | POA: Diagnosis not present

## 2023-11-04 DIAGNOSIS — I951 Orthostatic hypotension: Secondary | ICD-10-CM | POA: Diagnosis present

## 2023-11-04 DIAGNOSIS — R0989 Other specified symptoms and signs involving the circulatory and respiratory systems: Secondary | ICD-10-CM | POA: Diagnosis not present

## 2023-11-04 DIAGNOSIS — E039 Hypothyroidism, unspecified: Secondary | ICD-10-CM | POA: Diagnosis present

## 2023-11-04 DIAGNOSIS — I10 Essential (primary) hypertension: Secondary | ICD-10-CM | POA: Diagnosis present

## 2023-11-04 DIAGNOSIS — I82612 Acute embolism and thrombosis of superficial veins of left upper extremity: Secondary | ICD-10-CM | POA: Diagnosis present

## 2023-11-04 DIAGNOSIS — R0781 Pleurodynia: Secondary | ICD-10-CM | POA: Diagnosis not present

## 2023-11-04 DIAGNOSIS — J45909 Unspecified asthma, uncomplicated: Secondary | ICD-10-CM | POA: Diagnosis present

## 2023-11-04 DIAGNOSIS — Z79890 Hormone replacement therapy (postmenopausal): Secondary | ICD-10-CM | POA: Diagnosis not present

## 2023-11-04 DIAGNOSIS — R296 Repeated falls: Secondary | ICD-10-CM | POA: Diagnosis present

## 2023-11-04 DIAGNOSIS — R627 Adult failure to thrive: Secondary | ICD-10-CM | POA: Diagnosis present

## 2023-11-04 DIAGNOSIS — K219 Gastro-esophageal reflux disease without esophagitis: Secondary | ICD-10-CM | POA: Diagnosis present

## 2023-11-04 DIAGNOSIS — B9620 Unspecified Escherichia coli [E. coli] as the cause of diseases classified elsewhere: Secondary | ICD-10-CM | POA: Diagnosis present

## 2023-11-04 DIAGNOSIS — Z83438 Family history of other disorder of lipoprotein metabolism and other lipidemia: Secondary | ICD-10-CM

## 2023-11-04 DIAGNOSIS — S199XXA Unspecified injury of neck, initial encounter: Secondary | ICD-10-CM | POA: Diagnosis not present

## 2023-11-04 DIAGNOSIS — R9089 Other abnormal findings on diagnostic imaging of central nervous system: Secondary | ICD-10-CM | POA: Diagnosis not present

## 2023-11-04 DIAGNOSIS — R54 Age-related physical debility: Secondary | ICD-10-CM | POA: Diagnosis present

## 2023-11-04 DIAGNOSIS — Z8249 Family history of ischemic heart disease and other diseases of the circulatory system: Secondary | ICD-10-CM | POA: Diagnosis not present

## 2023-11-04 DIAGNOSIS — J9811 Atelectasis: Secondary | ICD-10-CM | POA: Diagnosis not present

## 2023-11-04 DIAGNOSIS — B965 Pseudomonas (aeruginosa) (mallei) (pseudomallei) as the cause of diseases classified elsewhere: Secondary | ICD-10-CM | POA: Diagnosis present

## 2023-11-04 DIAGNOSIS — I6782 Cerebral ischemia: Secondary | ICD-10-CM | POA: Diagnosis not present

## 2023-11-04 DIAGNOSIS — E66811 Obesity, class 1: Secondary | ICD-10-CM | POA: Diagnosis present

## 2023-11-04 DIAGNOSIS — Z825 Family history of asthma and other chronic lower respiratory diseases: Secondary | ICD-10-CM

## 2023-11-04 DIAGNOSIS — N1832 Chronic kidney disease, stage 3b: Secondary | ICD-10-CM | POA: Diagnosis present

## 2023-11-04 DIAGNOSIS — S0990XA Unspecified injury of head, initial encounter: Secondary | ICD-10-CM | POA: Diagnosis not present

## 2023-11-04 DIAGNOSIS — K449 Diaphragmatic hernia without obstruction or gangrene: Secondary | ICD-10-CM | POA: Diagnosis not present

## 2023-11-04 DIAGNOSIS — E8729 Other acidosis: Secondary | ICD-10-CM | POA: Diagnosis present

## 2023-11-04 DIAGNOSIS — R918 Other nonspecific abnormal finding of lung field: Secondary | ICD-10-CM | POA: Diagnosis not present

## 2023-11-04 DIAGNOSIS — Z66 Do not resuscitate: Secondary | ICD-10-CM | POA: Diagnosis present

## 2023-11-04 DIAGNOSIS — M549 Dorsalgia, unspecified: Secondary | ICD-10-CM | POA: Diagnosis not present

## 2023-11-04 DIAGNOSIS — Z818 Family history of other mental and behavioral disorders: Secondary | ICD-10-CM

## 2023-11-04 DIAGNOSIS — N390 Urinary tract infection, site not specified: Secondary | ICD-10-CM | POA: Diagnosis not present

## 2023-11-04 DIAGNOSIS — R4182 Altered mental status, unspecified: Secondary | ICD-10-CM | POA: Diagnosis not present

## 2023-11-04 DIAGNOSIS — Z888 Allergy status to other drugs, medicaments and biological substances status: Secondary | ICD-10-CM

## 2023-11-04 DIAGNOSIS — E162 Hypoglycemia, unspecified: Secondary | ICD-10-CM | POA: Diagnosis not present

## 2023-11-04 DIAGNOSIS — Z881 Allergy status to other antibiotic agents status: Secondary | ICD-10-CM

## 2023-11-04 DIAGNOSIS — G9341 Metabolic encephalopathy: Secondary | ICD-10-CM | POA: Diagnosis not present

## 2023-11-04 LAB — I-STAT CHEM 8, ED
BUN: 26 mg/dL — ABNORMAL HIGH (ref 8–23)
Calcium, Ion: 1.23 mmol/L (ref 1.15–1.40)
Chloride: 96 mmol/L — ABNORMAL LOW (ref 98–111)
Creatinine, Ser: 0.8 mg/dL (ref 0.44–1.00)
Glucose, Bld: 115 mg/dL — ABNORMAL HIGH (ref 70–99)
HCT: 40 % (ref 36.0–46.0)
Hemoglobin: 13.6 g/dL (ref 12.0–15.0)
Potassium: 4.4 mmol/L (ref 3.5–5.1)
Sodium: 133 mmol/L — ABNORMAL LOW (ref 135–145)
TCO2: 33 mmol/L — ABNORMAL HIGH (ref 22–32)

## 2023-11-04 LAB — COMPREHENSIVE METABOLIC PANEL WITH GFR
ALT: 41 U/L (ref 0–44)
AST: 72 U/L — ABNORMAL HIGH (ref 15–41)
Albumin: 3.7 g/dL (ref 3.5–5.0)
Alkaline Phosphatase: 97 U/L (ref 38–126)
Anion gap: 9 (ref 5–15)
BUN: 20 mg/dL (ref 8–23)
CO2: 30 mmol/L (ref 22–32)
Calcium: 10.3 mg/dL (ref 8.9–10.3)
Chloride: 96 mmol/L — ABNORMAL LOW (ref 98–111)
Creatinine, Ser: 0.75 mg/dL (ref 0.44–1.00)
GFR, Estimated: >60 mL/min (ref >60)
Glucose, Bld: 102 mg/dL — ABNORMAL HIGH (ref 70–99)
Potassium: 3.9 mmol/L (ref 3.5–5.1)
Sodium: 136 mmol/L (ref 135–145)
Total Bilirubin: 0.6 mg/dL (ref 0.0–1.2)
Total Protein: 6.2 g/dL — ABNORMAL LOW (ref 6.5–8.1)

## 2023-11-04 LAB — CBC WITH DIFFERENTIAL/PLATELET
Abs Immature Granulocytes: 0.04 K/uL (ref 0.00–0.07)
Basophils Absolute: 0.1 K/uL (ref 0.0–0.1)
Basophils Relative: 0 %
Eosinophils Absolute: 0 K/uL (ref 0.0–0.5)
Eosinophils Relative: 0 %
HCT: 41.5 % (ref 36.0–46.0)
Hemoglobin: 12.8 g/dL (ref 12.0–15.0)
Immature Granulocytes: 0 %
Lymphocytes Relative: 7 %
Lymphs Abs: 0.8 K/uL (ref 0.7–4.0)
MCH: 28.5 pg (ref 26.0–34.0)
MCHC: 30.8 g/dL (ref 30.0–36.0)
MCV: 92.4 fL (ref 80.0–100.0)
Monocytes Absolute: 0.7 K/uL (ref 0.1–1.0)
Monocytes Relative: 6 %
Neutro Abs: 9.8 K/uL — ABNORMAL HIGH (ref 1.7–7.7)
Neutrophils Relative %: 87 %
Platelets: 203 K/uL (ref 150–400)
RBC: 4.49 MIL/uL (ref 3.87–5.11)
RDW: 13.7 % (ref 11.5–15.5)
WBC: 11.5 K/uL — ABNORMAL HIGH (ref 4.0–10.5)
nRBC: 0 % (ref 0.0–0.2)

## 2023-11-04 LAB — URINALYSIS, ROUTINE W REFLEX MICROSCOPIC
Bilirubin Urine: NEGATIVE
Glucose, UA: NEGATIVE mg/dL
Hgb urine dipstick: NEGATIVE
Ketones, ur: NEGATIVE mg/dL
Nitrite: NEGATIVE
Protein, ur: 30 mg/dL — AB
Specific Gravity, Urine: 1.017 (ref 1.005–1.030)
pH: 6 (ref 5.0–8.0)

## 2023-11-04 LAB — CBG MONITORING, ED: Glucose-Capillary: 111 mg/dL — ABNORMAL HIGH (ref 70–99)

## 2023-11-04 LAB — CK: Total CK: 157 U/L (ref 38–234)

## 2023-11-04 MED ORDER — SODIUM CHLORIDE 0.9% FLUSH: 3.0000 mL | INTRAVENOUS | Status: DC | PRN

## 2023-11-04 MED ORDER — LEVOTHYROXINE SODIUM 125 MCG PO TABS
125.0000 ug | ORAL_TABLET | Freq: Every day | ORAL | Status: DC
  Administered 2023-11-05 – 2023-11-14 (×9): 125 ug via ORAL
  Filled 2023-11-04 (×9): qty 1

## 2023-11-04 MED ORDER — SODIUM CHLORIDE 0.9 % IV SOLN: 250.0000 mL | INTRAVENOUS | Status: AC | PRN

## 2023-11-04 MED ORDER — ALUM & MAG HYDROXIDE-SIMETH 200-200-20 MG/5ML PO SUSP: 30.0000 mL | Freq: Four times a day (QID) | ORAL | Status: DC | PRN

## 2023-11-04 MED ORDER — LATANOPROST 0.005 % OP SOLN
1.0000 [drp] | Freq: Every day | OPHTHALMIC | Status: DC
  Administered 2023-11-05 – 2023-11-13 (×9): 1 [drp] via OPHTHALMIC
  Filled 2023-11-04 (×2): qty 2.5

## 2023-11-04 MED ORDER — ACETAMINOPHEN 325 MG PO TABS
650.0000 mg | ORAL_TABLET | Freq: Four times a day (QID) | ORAL | Status: DC | PRN
  Administered 2023-11-09: 650 mg via ORAL
  Filled 2023-11-04: qty 2

## 2023-11-04 MED ORDER — ACETAMINOPHEN 650 MG RE SUPP
650.0000 mg | Freq: Four times a day (QID) | RECTAL | Status: DC | PRN
Start: 2023-11-04 — End: 2023-11-13

## 2023-11-04 MED ORDER — POTASSIUM CHLORIDE 2 MEQ/ML IV SOLN
INTRAVENOUS | Status: DC
  Filled 2023-11-04 (×2): qty 1000

## 2023-11-04 MED ORDER — SODIUM CHLORIDE 0.9 % IV BOLUS
1000.0000 mL | Freq: Once | INTRAVENOUS | Status: AC
  Administered 2023-11-04: 1000 mL via INTRAVENOUS

## 2023-11-04 MED ORDER — SODIUM CHLORIDE 0.9% FLUSH
3.0000 mL | Freq: Two times a day (BID) | INTRAVENOUS | Status: DC
  Administered 2023-11-04 – 2023-11-10 (×10): 3 mL via INTRAVENOUS

## 2023-11-04 MED ORDER — SODIUM CHLORIDE 0.9 % IV SOLN: INTRAVENOUS | Status: AC

## 2023-11-04 MED ORDER — IBUPROFEN 200 MG PO TABS: 600.0000 mg | ORAL_TABLET | Freq: Four times a day (QID) | ORAL | Status: DC | PRN

## 2023-11-04 MED ORDER — ONDANSETRON HCL 4 MG/2ML IJ SOLN: 4.0000 mg | Freq: Four times a day (QID) | INTRAMUSCULAR | Status: DC | PRN

## 2023-11-04 MED ORDER — ENOXAPARIN SODIUM 40 MG/0.4ML IJ SOSY
40.0000 mg | PREFILLED_SYRINGE | INTRAMUSCULAR | Status: DC
  Administered 2023-11-05 – 2023-11-07 (×3): 40 mg via SUBCUTANEOUS
  Filled 2023-11-04 (×3): qty 0.4

## 2023-11-04 MED ORDER — IRBESARTAN 75 MG PO TABS
75.0000 mg | ORAL_TABLET | Freq: Every day | ORAL | Status: DC
  Administered 2023-11-06 – 2023-11-07 (×2): 75 mg via ORAL
  Filled 2023-11-04 (×3): qty 1

## 2023-11-04 MED ORDER — TRAMADOL HCL 50 MG PO TABS
100.0000 mg | ORAL_TABLET | Freq: Four times a day (QID) | ORAL | Status: DC | PRN
Start: 2023-11-04 — End: 2023-11-08
  Administered 2023-11-07: 100 mg via ORAL
  Filled 2023-11-04: qty 2

## 2023-11-04 MED ORDER — SODIUM CHLORIDE 0.9% FLUSH
3.0000 mL | Freq: Two times a day (BID) | INTRAVENOUS | Status: DC
  Administered 2023-11-04 – 2023-11-13 (×18): 3 mL via INTRAVENOUS

## 2023-11-04 MED ORDER — ONDANSETRON HCL 4 MG PO TABS: 4.0000 mg | ORAL_TABLET | Freq: Four times a day (QID) | ORAL | Status: DC | PRN

## 2023-11-04 NOTE — ED Triage Notes (Addendum)
 BIB EMS 2 weeks of back pain and weakness. Clem[place] today and could not get up due to the weakness. Redness on left cheek from fall. Did not hit head, no thinners 64-166/82-CBG 135  #20 L AC

## 2023-11-04 NOTE — ED Provider Notes (Signed)
 See previous note for full details.  Patient received in signout. She is on a diuretic which she takes daily for general peripheral edema.  No history of CHF. Physical Exam  BP (!) 158/83   Pulse 72   Temp 98.1 F (36.7 C)   Resp (!) 24   Ht 4' 11 (1.499 m)   Wt 63 kg   SpO2 94%   BMI 28.07 kg/m     Procedures  Procedures  ED Course / MDM   Clinical Course as of 11/04/23 2103  Fri Nov 04, 2023  1859 Patient is unsure if she had a mechanical fall or had a syncopal event.  She was on the floor for hours.  Son is at bedside.  Patient lives alone.  Will place patient on telemetry.  Will discuss with hospitalist for admission for concerning cause of syncope.  According to the nurse patient was very symptomatic when attempted to get to bedside commode.  Also very symptomatic just sitting up in bed.  Will consider this to be positive orthostatics.  She did get some fluids already.  She is due to get some maintenance fluids.  Additional fluids per hospitalist.  Patient is agreeable to admission. [AA]  1929 Spoke to Dr. Jackee with hospitalist service who requests a callback when I am at my computer to review patient's medical history prior to discussing reason for admission. [AA]  1939 Attempted call back to hospitalist.  No answer. [AA]  2042 Spoke to hospitalist Dr. Jackee who will evaluate patient for admission. [AA]  2050 Patient updated on plan. [AA]    Clinical Course User Index [AA] Hildegard Loge, PA-C   Medical Decision Making Amount and/or Complexity of Data Reviewed Labs: ordered. Radiology: ordered.  Risk Prescription drug management.   Orthostatic positive.  Unable to complete orthostatic vital signs due to being symptomatic. Discussed with hospitalist for concerning cause of syncope for additional workup and management.  They will evaluate patient for admission.       Hildegard Loge, PA-C 11/04/23 2104    Rogelia Jerilynn RAMAN, MD 11/04/23 [phone]

## 2023-11-04 NOTE — Progress Notes (Signed)
 Complex Care Management Note Care Guide Note  11/04/2023 Name: Anna Adams MRN: [id number] DOB: 1934-12-10   Complex Care Management Outreach Attempts: An unsuccessful telephone outreach was attempted today to offer the patient information about available complex care management services.  Follow Up Plan:  Additional outreach attempts will be made to offer the patient complex care management information and services.   Encounter Outcome:  No Answer  Thedford Franks, CMA [place]  [facility], [facility] Guide Direct Dial: [phone]  Fax: [phone] Website: [email or web address]

## 2023-11-04 NOTE — H&P (Signed)
 History and Physical    Anna Adams FMW:[id number] DOB: Jun 04, 1934 DOA: 11/04/2023  PCP: Geofm Glade PARAS, MD  Patient coming from: Home  Chief Complaint: Fall at home with possible syncope  HPI: Anna Adams is a 89 y.o. female with medical history significant of recent episode of dizziness on September 29 and a past medical history significant for chronic kidney disease, hypertension, weakness and adult failure to thrive who presents to the emergency department after being found at home by a caretaker.  She was lying on her left side.  She was last seen normal last night and last ate food last night.  She was on the ground for an indeterminate amount of time.  Patient does not recall if she tripped or fell or passed out.  Son reports that on September 29 he brought her into the emergency department because she was so dizzy she could not sit up in bed.  Was given some IV fluids and treated for urinary tract infection and did okay until today.  He reports that the patient does not drink very much.  She lives alone and has people check on her a couple of times a week.  Last time when she got ill this done stayed with her all day and tried to encourage her to drink fluids ultimately improved for a small period of time.  ED Course: Formal orthostatics were not performed however the patient was symptomatic just sitting up in bed became very dizzy and weak and the ED for provider decided to consider this as positive orthostatics. Patient referred to hospital medicine for admission  Review of Systems: As per HPI otherwise all other systems reviewed and  negative.   Past Medical History:  Diagnosis Date   ALLERGIC RHINITIS    Anemia    Asthma    Asthma    Cataract    GERD (gastroesophageal reflux disease)    with HH   History of renal calculi    Hypertension    Hypothyroidism    Osteoarthritis    Parotid tumor    L side, s/p resection 1980 and 2007    Past Surgical History:  Procedure  Laterality Date   CARDIOVASCULAR STRESS TEST  07/12/2005   EF 85%   CERVICAL DISCECTOMY  1993   GANGLION CYST EXCISION  1954 & 1963   (L) wrist x's 2   KNEE ARTHROPLASTY  04/22/2006   DR. MICAEL SIEVING CAFFREY   Left knee replacement  2005   LUMBAR DISC SURGERY     PAROTID GLAND TUMOR EXCISION  1980, 2007   Left   right knee replacement  2008   TONSILLECTOMY AND ADENOIDECTOMY     TUBAL LIGATION      Social History   Social History Narrative   Recently widowed, lives with her dogs, retired [employer] fundraiser   Right handed   Caffeine: up to 4-5 cups/day (coffee and tea)     reports that she quit smoking about 61 years ago. Her smoking use included cigarettes. She has never used smokeless tobacco. She reports that she does not currently use alcohol. She reports that she does not use drugs.  Allergies  Allergen Reactions   Contrast Media [Iodinated Contrast Media] Other (See Comments)   Dyphylline-Guaifenesin    Erythromycin Nausea Only   Gabapentin      Affected memory   Mevacor [Lovastatin]     myalgias   Other     CT scan dye    Family History  Problem Relation  Age of Onset   Asthma Mother    Cancer Mother    Heart disease Mother    Hypertension Father    Heart disease Father    Hyperlipidemia Father    Stroke Father    Mental illness Father    Stroke Maternal Grandmother    Heart disease Maternal Grandfather    Hypertension Paternal Grandfather      Prior to Admission medications   Medication Sig Start Date End Date Taking? Authorizing Provider  atorvastatin  (LIPITOR) 20 MG tablet TAKE ONE TABLET BY MOUTH AT BEDTIME 05/19/23   Geofm Glade PARAS, MD  b complex vitamins tablet Take 1 tablet by mouth daily.    [provider]  Biotin 89999 MCG TABS See admin instructions.    [provider]  cholecalciferol (VITAMIN D ) 1000 UNITS tablet Take 400 Units by mouth daily.    [provider]  famotidine  (PEPCID ) 40 MG tablet Take 1 tablet (40 mg total) by mouth  daily. 05/03/23   Geofm Glade PARAS, MD  furosemide  (LASIX ) 20 MG tablet Take 1 tablet by mouth daily in the afternoon 11/22/22   Geofm Glade PARAS, MD  furosemide  (LASIX ) 40 MG tablet TAKE 1 TABLET BY MOUTH EVERY MORNING 10/19/23   Geofm Glade PARAS, MD  hydrocortisone -pramoxine (ANALPRAM  HC) 2.5-1 % rectal cream Place 1 application rectally 3 (three) times daily. 09/11/16   Tharon Lenis, NP  latanoprost (XALATAN) 0.005 % ophthalmic solution  04/15/19   [provider]  levothyroxine  (SYNTHROID ) 125 MCG tablet TAKE ONE TABLET BY MOUTH DAILY 06/30/23   Geofm Glade PARAS, MD  potassium chloride  SA (KLOR-CON  M) 20 MEQ tablet Take 1 tablet (20 mEq total) by mouth daily. 07/26/22   Geofm Glade PARAS, MD  Pramoxine-HC (HYDROCORTISONE  ACE-PRAMOXINE) 2.5-1 % CREA Place rectally. 09/11/16   [provider]  PROVENTIL  HFA 108 (90 Base) MCG/ACT inhaler INHALE 1-2 PUFFS INTO THE LUNGS EVERY 6 (SIX) HOURS AS NEEDED FOR WHEEZING. 09/23/22   Geofm Glade PARAS, MD  telmisartan  (MICARDIS ) 20 MG tablet Take 0.5 tablets (10 mg total) by mouth daily. 11/02/23   Geofm Glade PARAS, MD  traMADol  (ULTRAM ) 50 MG tablet TAKE 1 TABLET BY MOUTH EVERY 8 HOURS 07/19/23   Burns, Glade PARAS, MD  UNABLE TO FIND at bedtime. Osteo-ease    [provider]  vitamin E 200 UNIT capsule Take 200 Units by mouth daily.    [provider]  zoledronic  acid (RECLAST ) 5 MG/100ML SOLN injection See admin instructions. 08/01/18   [provider]    Physical Exam:  Constitutional: NAD, calm, comfortable frail and chronically ill-appearing Vitals:   11/04/23 1515 11/04/23 1715 11/04/23 1830 11/04/23 1900  BP: (!) 173/88 (!) 184/85 (!) 174/90 (!) 158/83  Pulse: 69 70 74 72  Resp: 18 16 20  (!) 24  Temp:  97.8 F (36.6 C) 98.1 F (36.7 C)   SpO2: 94% 90% (!) 89% 94%  Weight:      Height:       Eyes: PERRL, lids and conjunctivae normal ENMT: Mucous membranes are moist. Posterior pharynx clear of any exudate or lesions.Normal  dentition.  Neck: normal, supple, no masses, no thyromegaly Respiratory: clear to auscultation bilaterally, no wheezing, no crackles. Normal respiratory effort. No accessory muscle use.  Cardiovascular: Regular rate and rhythm, no murmurs / rubs / gallops. No extremity edema. 2+ pedal pulses. No carotid bruits.  Abdomen: no tenderness, no masses palpated. No hepatosplenomegaly. Bowel sounds positive.  Musculoskeletal: no clubbing / cyanosis. No  joint deformity upper and lower extremities. Good ROM, no contractures. Normal muscle tone.  Skin: no rashes, lesions, ulcers. No induration Neurologic: CN 2-12 grossly intact. Sensation intact, DTR normal. Strength 5/5 in all 4.  Psychiatric: Normal judgment and insight. Alert and oriented x 3. Normal mood.    Labs on Admission: I have personally reviewed following labs and imaging studies  CBC: Recent Labs  Lab 11/04/23 1450 11/04/23 1457  WBC 11.5*  --   NEUTROABS 9.8*  --   HGB 12.8 13.6  HCT 41.5 40.0  MCV 92.4  --   PLT 203  --    Basic Metabolic Panel: Recent Labs  Lab 11/04/23 1457 11/04/23 1547  NA 133* 136  K 4.4 3.9  CL 96* 96*  CO2  --  30  GLUCOSE 115* 102*  BUN 26* 20  CREATININE 0.80 0.75  CALCIUM   --  10.3   GFR: Estimated Creatinine Clearance: 40 mL/min (by C-G formula based on SCr of 0.75 mg/dL). Liver Function Tests: Recent Labs  Lab 11/04/23 1547  AST 72*  ALT 41  ALKPHOS 97  BILITOT 0.6  PROT 6.2*  ALBUMIN 3.7    Cardiac Enzymes: Recent Labs  Lab 11/04/23 1547  CKTOTAL 157   CBG: Recent Labs  Lab 11/04/23 1450  GLUCAP 111*   Urine analysis:    Component Value Date/Time   COLORURINE YELLOW 11/04/2023 1633   APPEARANCEUR HAZY (A) 11/04/2023 1633   LABSPEC 1.017 11/04/2023 1633   PHURINE 6.0 11/04/2023 1633   GLUCOSEU NEGATIVE 11/04/2023 1633   GLUCOSEU NEGATIVE 11/02/2023 1236   HGBUR NEGATIVE 11/04/2023 1633   BILIRUBINUR NEGATIVE 11/04/2023 1633   BILIRUBINUR large (A) 01/03/2021  1108   BILIRUBINUR large 04/29/2014 1842   KETONESUR NEGATIVE 11/04/2023 1633   PROTEINUR 30 (A) 11/04/2023 1633   UROBILINOGEN 0.2 11/02/2023 1236   NITRITE NEGATIVE 11/04/2023 1633   LEUKOCYTESUR SMALL (A) 11/04/2023 1633    Radiological Exams on Admission: CT CERVICAL SPINE WO CONTRAST Result Date: 11/04/2023 EXAM: CT CERVICAL SPINE WITHOUT CONTRAST 11/04/2023 03:38:30 PM TECHNIQUE: CT of the cervical spine was performed without the administration of intravenous contrast. Multiplanar reformatted images are provided for review. Automated exposure control, iterative reconstruction, and/or weight based adjustment of the mA/kV was utilized to reduce the radiation dose to as low as reasonably achievable. COMPARISON: None available. CLINICAL HISTORY: Neck trauma (Age >= 65y). BIB EMS 2 weeks of back pain and weakness. [place] today and could not get up due to the weakness. Redness on left cheek from fall. Did not hit head, no thinners 64-166/82-CBG 135. FINDINGS: CERVICAL SPINE: BONES AND ALIGNMENT: No acute fracture or traumatic malalignment. Overall straightening of the normal cervical lordosis is present. DEGENERATIVE CHANGES: 2 mm anterolisthesis at C3-C4 appears chronic with advanced endplate and facet changes. Chronic endplate changes and ankylosis are present at C4-C5 and C5-C6. Chronic endplate changes are present at C6-C7. Grade 1 anterolisthesis at C7-T1 is degenerative. Asymmetric posterior facet arthropathy is present on the right at C3-C4. SOFT TISSUES: No prevertebral soft tissue swelling. IMPRESSION: 1. No acute abnormality of the cervical spine related to the reported neck trauma. 2. Chronic anterolisthesis at C3-4 with advanced endplate and facet arthropathy. 3. Degenerative grade 1 anterolisthesis at C7-T1. Electronically signed by: Lonni Necessary MD 11/04/2023 03:48 PM EDT RP Workstation: [id number]   CT HEAD WO CONTRAST Result Date: 11/04/2023 EXAM: CT HEAD WITHOUT CONTRAST  11/04/2023 03:38:30 PM TECHNIQUE: CT of the head was performed without the administration of intravenous contrast. Automated  exposure control, iterative reconstruction, and/or weight based adjustment of the mA/kV was utilized to reduce the radiation dose to as low as reasonably achievable. COMPARISON: None available. CLINICAL HISTORY: Minor head trauma (age >= 65 years). Patient presented via EMS with 2 weeks of back pain and weakness, fell today and could not get up. Redness on the left cheek from the fall; patient did not hit head and is not on thinners. FINDINGS: BRAIN AND VENTRICLES: Moderate atrophy and white matter changes are noted bilaterally. Atherosclerotic calcifications are present in the cavernous carotid arteries bilaterally. No hyperdense vessel is present. No acute hemorrhage. No evidence of acute infarct. No hydrocephalus. No extra-axial collection. No mass effect or midline shift. ORBITS: No acute abnormality. SINUSES: Mild mucosal thickening is present in the right sphenoid sinus. Minimal fluid is present in the left maxillary sinus without associated fracture. SOFT TISSUES AND SKULL: Soft tissue swelling is present over the left temporal and posterior frontal scalp without underlying fracture or foreign body. Supraorbital scalp soft tissue swelling is present without underlying fracture or foreign body. No skull fracture. IMPRESSION: 1. No acute intracranial abnormality. 2. Soft tissue swelling over the left temporal, posterior frontal, and supraorbital scalp without underlying fracture or foreign body. Electronically signed by: Lonni Necessary MD 11/04/2023 03:44 PM EDT RP Workstation: [id number]   DG Chest Port 1 View Result Date: 11/04/2023 CLINICAL DATA:  Left-sided rib tenderness. Back pain and weakness for 2 weeks. Fall. EXAM: PORTABLE CHEST 1 VIEW COMPARISON:  10/24/2023 FINDINGS: Shallow inspiration. Large left diaphragmatic hernia, possibly hiatal or left hemidiaphragm. This  contains stool filled colon with similar appearance to previous study. Atelectasis in the left lung base. Right lung is clear. Cardiac enlargement. Degenerative changes in the spine and shoulders. IMPRESSION: Large left diaphragmatic or hiatal hernia containing colon with similar appearance to previous study. Cardiac enlargement. Atelectasis in the left base. Electronically Signed   By: Elsie Gravely M.D.   On: 11/04/2023 15:17    EKG: Independently reviewed.  Sinus rhythm with no ST-T wave abnormalities  Assessment/Plan Principal Problem:   Orthostatic hypotension Active Problems:   Fall at home, initial encounter   Syncope and collapse   1.  Presumed orthostatic hypotension: - admit to hospital for presumed syncope and orthostatic hypotension -Continue IV fluids - Restart home medications but hold on furosemide  - Check echocardiogram.  Echocardiogram from 2023 had normal ejection fraction.  2.  Fall at home: -Likely precipitated by dizziness - Monitor orthostatics - PT OT evaluation - Patient will likely need short-term rehab at a nursing facility for strengthening prior to discharging home.  3.  Syncope and collapse: -This is presumed we will check echocardiogram will check actual orthostatics every 8 hours x 6    Incomplete database: Awaiting medication reconciliation by pharmacy DVT prophylaxis: Enoxaparin Code Status: Full code Family Communication: Spoke with son who was present at the time of admission Disposition Plan: Likely short-term rehab Consults called: None Admission status: Inpatient   Zebedee JAYSON Fujisawa MD FACP [facility] Pager [phone]  How to contact the [facility] Attending or Consulting provider 7A - 7P or covering provider during after hours 7P -7A, for this patient?  Check the care team in [facility] and look for a) attending/consulting [facility] provider listed and b) the [facility] team listed Log into [email or web address] and use [place]'s universal password to  access. If you do not have the password, please contact the hospital operator. Locate the [facility] provider you are looking for under [facility] and  page to a number that you can be directly reached. If you still have difficulty reaching the provider, please page the [facility] (Director on Call) for the Hospitalists listed on amion for assistance.  If 7PM-7AM, please contact night-coverage [email or web address] Password [email or web address]  11/04/2023, 9:29 PM

## 2023-11-04 NOTE — ED Provider Notes (Signed)
 [place] EMERGENCY DEPARTMENT AT [facility] Provider Note   CSN: [id number] Arrival date & time: 11/04/23  1332     Patient presents with: Anna Adams is a 89 y.o. female who presents to the ED after having an unwitnessed fall at home.  Was found by caregiver lying on the kitchen floor on her left side, last seen normal last night, on the floor for an indeterminate amount of time.  Does not have any recollection if she tripped or fell, or had a syncopal event however was seen in this ED on 29 September for dizziness.  Previous noted medical history of hypothyroidism, primary hypertension, stage III CKD, previous diagnosis of poor balance.  Denies having any pain at present, denies having shortness of breath or chest pain, denies any nausea or vomiting.  Does not report if she has had anything to eat however her son says that the last meal that he knows she had was dinner last night.    Fall       Prior to Admission medications   Medication Sig Start Date End Date Taking? Authorizing Provider  atorvastatin  (LIPITOR) 20 MG tablet TAKE ONE TABLET BY MOUTH AT BEDTIME 05/19/23   Geofm Glade PARAS, MD  b complex vitamins tablet Take 1 tablet by mouth daily.    [provider]  Biotin 89999 MCG TABS See admin instructions.    [provider]  cholecalciferol (VITAMIN D ) 1000 UNITS tablet Take 400 Units by mouth daily.    [provider]  famotidine  (PEPCID ) 40 MG tablet Take 1 tablet (40 mg total) by mouth daily. 05/03/23   Geofm Glade PARAS, MD  furosemide  (LASIX ) 20 MG tablet Take 1 tablet by mouth daily in the afternoon 11/22/22   Geofm Glade PARAS, MD  furosemide  (LASIX ) 40 MG tablet TAKE 1 TABLET BY MOUTH EVERY MORNING 10/19/23   Geofm Glade PARAS, MD  hydrocortisone -pramoxine (ANALPRAM  HC) 2.5-1 % rectal cream Place 1 application rectally 3 (three) times daily. 09/11/16   Tharon Lenis, NP  latanoprost (XALATAN) 0.005 % ophthalmic solution  04/15/19    [provider]  levothyroxine  (SYNTHROID ) 125 MCG tablet TAKE ONE TABLET BY MOUTH DAILY 06/30/23   Geofm Glade PARAS, MD  potassium chloride  SA (KLOR-CON  M) 20 MEQ tablet Take 1 tablet (20 mEq total) by mouth daily. 07/26/22   Geofm Glade PARAS, MD  Pramoxine-HC (HYDROCORTISONE  ACE-PRAMOXINE) 2.5-1 % CREA Place rectally. 09/11/16   [provider]  PROVENTIL  HFA 108 (90 Base) MCG/ACT inhaler INHALE 1-2 PUFFS INTO THE LUNGS EVERY 6 (SIX) HOURS AS NEEDED FOR WHEEZING. 09/23/22   Burns, Glade PARAS, MD  telmisartan  (MICARDIS ) 20 MG tablet Take 0.5 tablets (10 mg total) by mouth daily. 11/02/23   Geofm Glade PARAS, MD  traMADol  (ULTRAM ) 50 MG tablet TAKE 1 TABLET BY MOUTH EVERY 8 HOURS 07/19/23   Burns, Glade PARAS, MD  UNABLE TO FIND at bedtime. Osteo-ease    [provider]  vitamin E 200 UNIT capsule Take 200 Units by mouth daily.    [provider]  zoledronic  acid (RECLAST ) 5 MG/100ML SOLN injection See admin instructions. 08/01/18   [provider]    Allergies: Contrast media [iodinated contrast media], Dyphylline-guaifenesin, Erythromycin, Gabapentin , Mevacor [lovastatin], and Other    Review of Systems  Constitutional:  Positive for fatigue.  Neurological:  Positive for dizziness and weakness.  All other systems reviewed and are negative.   Updated Vital Signs BP (!) 183/96  Pulse 63   Temp (!) 97.5 F (36.4 C)   Resp 18   Ht 4' 11 (1.499 m)   Wt 63 kg   SpO2 91%   BMI 28.07 kg/m   Physical Exam Vitals and nursing note reviewed.  Constitutional:      General: She is awake. She is not in acute distress.    Appearance: Normal appearance. She is well-developed.  HENT:     Head: Normocephalic. Left periorbital erythema present.      Comments: No tenderness appreciated over palpation of the left orbit however there is notable swelling and erythema.  Ocular motions intact without impairment.    Right Ear: Hearing, tympanic membrane, ear canal and external  ear normal.     Left Ear: Hearing, tympanic membrane, ear canal and external ear normal.     Nose: Nose normal.     Mouth/Throat:     Lips: Pink.     Mouth: Mucous membranes are moist.     Pharynx: Oropharynx is clear.  Eyes:     General: Lids are normal. Vision grossly intact. Gaze aligned appropriately. No visual field deficit.    Extraocular Movements: Extraocular movements intact.     Conjunctiva/sclera: Conjunctivae normal.     Pupils: Pupils are equal, round, and reactive to light.  Cardiovascular:     Rate and Rhythm: Normal rate and regular rhythm.     Pulses: Normal pulses.     Heart sounds: Normal heart sounds. No murmur heard.    No friction rub. No gallop.  Pulmonary:     Effort: Pulmonary effort is normal.     Breath sounds: Normal breath sounds and air entry.  Chest:     Chest wall: Tenderness present.       Comments: Tenderness appreciated to left lower thorax Abdominal:     General: Abdomen is flat. Bowel sounds are normal.     Palpations: Abdomen is soft.     Tenderness: There is no abdominal tenderness.  Musculoskeletal:     Right shoulder: Normal.     Left shoulder: Decreased range of motion.     Cervical back: Normal range of motion and neck supple.     Right lower leg: No edema.     Left lower leg: No edema.     Comments: Decreased range of motion with extension and abduction of the left shoulder.  Skin:    General: Skin is warm and dry.     Capillary Refill: Capillary refill takes less than 2 seconds.  Neurological:     General: No focal deficit present.     Mental Status: She is alert and oriented to person, place, and time. Mental status is at baseline.     GCS: GCS eye subscore is 4. GCS verbal subscore is 5. GCS motor subscore is 6.     Cranial Nerves: No cranial nerve deficit, dysarthria or facial asymmetry.     Sensory: No sensory deficit.     Motor: Weakness present.     Coordination: Finger-Nose-Finger Test and Heel to [place] Test normal.      Comments: Generalized weakness without any focal deficits appreciated.  Psychiatric:        Mood and Affect: Mood normal.        Behavior: Behavior is cooperative.     (all labs ordered are listed, but only abnormal results are displayed) Labs Reviewed  CBG MONITORING, ED - Abnormal; Notable for the following components:      Result Value  Glucose-Capillary 111 (*)    All other components within normal limits  URINE CULTURE  CBC WITH DIFFERENTIAL/PLATELET  COMPREHENSIVE METABOLIC PANEL WITH GFR  URINALYSIS, ROUTINE W REFLEX MICROSCOPIC  CK  I-STAT CHEM 8, ED    EKG: None  Radiology: No results found.   Procedures   Medications Ordered in the ED  sodium chloride 0.9 % bolus 1,000 mL (has no administration in time range)    And  0.9 %  sodium chloride infusion (has no administration in time range)                                    Medical Decision Making Amount and/or Complexity of Data Reviewed Labs: ordered. Radiology: ordered.  Risk Prescription drug management.   Medical Decision Making:   Anna Adams is a 89 y.o. female who presented to the ED today with back pain and generalized weakness after having fallen at home detailed above.    Additional history discussed with patient's family/caregivers.  External chart has been reviewed including previous labs and imaging. Patient's presentation is complicated by their history of hypothyroidism, CKD, as well as previously known poor balance..  Patient placed on continuous vitals and telemetry monitoring while in ED which was reviewed periodically.  Complete initial physical exam performed, notably the patient  was alert and oriented no apparent distress.  There were no focal deficits appreciated on the neurologic exam, she does have limitations of movement in the left upper extremity due to stiffness in the left shoulder with abduction and extension however this was the side that she was laying on at the time she  was found on the ground.  There is also left-sided periorbital erythema.  There is also tenderness appreciated to the left lower side of the rib cage.    Reviewed and confirmed nursing documentation for past medical history, family history, social history.    Initial Assessment:   With the patient's presentation of fall, differential diagnosis includes rhabdomyolysis, fractures of the associated areas, acute neurovascular insult, continued UTI, possibly complicated UTI.   Initial Plan:  Obtain serum CK secondary to fall of unknown duration. Screening labs including CBC and Metabolic panel to evaluate for infectious or metabolic etiology of disease.  Secondary to likely fluid volume deficit and potential for rhabdomyolysis, initiate normal saline bolus and maintenance fluids. Urinalysis with reflex culture ordered to evaluate for UTI or relevant urologic/nephrologic pathology.  Sent for culture as well. CXR to evaluate for structural/infectious intrathoracic pathology.  EKG to evaluate for cardiac pathology Objective evaluation as below reviewed   Initial Study Results:   Laboratory  All laboratory results reviewed without evidence of clinically relevant pathology.   Pending at time of care handoff  EKG EKG was reviewed independently. Rate, rhythm, axis, intervals all examined and without medically relevant abnormality. ST segments without concerns for elevations.    Radiology:  Imaging pending at time of care handoff.  Reassessment and Plan:   Care signed off to A.Amjad, PA-C.  Disposition at this time is pending results of of workup obtained.  As she has had repeat visits for dizziness, possible UTI, patient may require admission for continued evaluation workup, however large amounts of the workup are pending at the time of sign off.  Patient continues to get IV fluids, labs are pending at this time.       Final diagnoses:  None    ED Discharge  Orders     None           Myriam Dorn BROCKS, [place] 11/04/23 1526    Freddi Hamilton, MD 11/07/23 [phone]

## 2023-11-05 ENCOUNTER — Inpatient Hospital Stay (HOSPITAL_COMMUNITY)

## 2023-11-05 DIAGNOSIS — R55 Syncope and collapse: Secondary | ICD-10-CM | POA: Diagnosis not present

## 2023-11-05 DIAGNOSIS — W19XXXA Unspecified fall, initial encounter: Secondary | ICD-10-CM | POA: Diagnosis not present

## 2023-11-05 DIAGNOSIS — I951 Orthostatic hypotension: Secondary | ICD-10-CM | POA: Diagnosis not present

## 2023-11-05 LAB — VITAMIN B12: Vitamin B-12: 465 pg/mL (ref 180–914)

## 2023-11-05 LAB — URINE CULTURE

## 2023-11-05 LAB — CBC
HCT: 36 % (ref 36.0–46.0)
HCT: 35.8 % — ABNORMAL LOW (ref 36.0–46.0)
Hemoglobin: 11.2 g/dL — ABNORMAL LOW (ref 12.0–15.0)
Hemoglobin: 11 g/dL — ABNORMAL LOW (ref 12.0–15.0)
MCH: 29.1 pg (ref 26.0–34.0)
MCH: 28.7 pg (ref 26.0–34.0)
MCHC: 31.1 g/dL (ref 30.0–36.0)
MCHC: 30.7 g/dL (ref 30.0–36.0)
MCV: 93.5 fL (ref 80.0–100.0)
MCV: 93.5 fL (ref 80.0–100.0)
Platelets: 212 K/uL (ref 150–400)
Platelets: 222 K/uL (ref 150–400)
RBC: 3.85 MIL/uL — ABNORMAL LOW (ref 3.87–5.11)
RBC: 3.83 MIL/uL — ABNORMAL LOW (ref 3.87–5.11)
RDW: 14 % (ref 11.5–15.5)
RDW: 14 % (ref 11.5–15.5)
WBC: 9.2 K/uL (ref 4.0–10.5)
WBC: 8.5 K/uL (ref 4.0–10.5)
nRBC: 0 % (ref 0.0–0.2)
nRBC: 0 % (ref 0.0–0.2)

## 2023-11-05 LAB — RETICULOCYTES
Immature Retic Fract: 13.1 % (ref 2.3–15.9)
RBC.: 3.86 MIL/uL — ABNORMAL LOW (ref 3.87–5.11)
Retic Count, Absolute: 80.3 K/uL (ref 19.0–186.0)
Retic Ct Pct: 2.1 % (ref 0.4–3.1)

## 2023-11-05 LAB — BASIC METABOLIC PANEL WITH GFR
Anion gap: 7 (ref 5–15)
BUN: 17 mg/dL (ref 8–23)
CO2: 31 mmol/L (ref 22–32)
Calcium: 10.2 mg/dL (ref 8.9–10.3)
Chloride: 99 mmol/L (ref 98–111)
Creatinine, Ser: 0.74 mg/dL (ref 0.44–1.00)
GFR, Estimated: >60 mL/min (ref >60)
Glucose, Bld: 86 mg/dL (ref 70–99)
Potassium: 4.3 mmol/L (ref 3.5–5.1)
Sodium: 136 mmol/L (ref 135–145)

## 2023-11-05 LAB — ECHOCARDIOGRAM COMPLETE
Area-P 1/2: 2.85 cm2
Height: 59 in
S' Lateral: 1.9 cm
Weight: 2224 [oz_av]

## 2023-11-05 LAB — IRON AND TIBC
Iron: 37 ug/dL (ref 28–170)
Saturation Ratios: 12 % (ref 10.4–31.8)
TIBC: 305 ug/dL (ref 250–450)
UIBC: 268 ug/dL

## 2023-11-05 LAB — CREATININE, SERUM
Creatinine, Ser: 0.72 mg/dL (ref 0.44–1.00)
GFR, Estimated: >60 mL/min (ref >60)

## 2023-11-05 LAB — CBG MONITORING, ED: Glucose-Capillary: 77 mg/dL (ref 70–99)

## 2023-11-05 LAB — TSH: TSH: 1.87 u[IU]/mL (ref 0.350–4.500)

## 2023-11-05 LAB — GLUCOSE, CAPILLARY
Glucose-Capillary: 122 mg/dL — ABNORMAL HIGH (ref 70–99)
Glucose-Capillary: 94 mg/dL (ref 70–99)

## 2023-11-05 LAB — FOLATE: Folate: 5.8 ng/mL — ABNORMAL LOW (ref >5.9)

## 2023-11-05 LAB — FERRITIN: Ferritin: 116 ng/mL (ref 11–307)

## 2023-11-05 MED ORDER — ATORVASTATIN CALCIUM 40 MG PO TABS
20.0000 mg | ORAL_TABLET | Freq: Every day | ORAL | Status: DC
  Administered 2023-11-05 – 2023-11-09 (×4): 20 mg via ORAL
  Filled 2023-11-05 (×3): qty 2
  Filled 2023-11-05 (×2): qty 1

## 2023-11-05 MED ORDER — B COMPLEX-C PO TABS
1.0000 | ORAL_TABLET | Freq: Every day | ORAL | Status: DC
  Administered 2023-11-06 – 2023-11-13 (×6): 1 via ORAL
  Filled 2023-11-05 (×8): qty 1

## 2023-11-05 MED ORDER — CHOLECALCIFEROL 10 MCG (400 UNIT) PO TABS
400.0000 [IU] | ORAL_TABLET | Freq: Every day | ORAL | Status: DC
  Administered 2023-11-06 – 2023-11-07 (×2): 400 [IU] via ORAL
  Filled 2023-11-05 (×4): qty 1

## 2023-11-05 MED ORDER — FAMOTIDINE 20 MG PO TABS
40.0000 mg | ORAL_TABLET | Freq: Every day | ORAL | Status: DC
  Administered 2023-11-05 – 2023-11-07 (×3): 40 mg via ORAL
  Filled 2023-11-05 (×3): qty 2

## 2023-11-05 MED ORDER — ENSURE PLUS HIGH PROTEIN PO LIQD
237.0000 mL | Freq: Two times a day (BID) | ORAL | Status: DC
  Administered 2023-11-05 – 2023-11-07 (×3): 237 mL via ORAL

## 2023-11-05 MED ORDER — FOLIC ACID 1 MG PO TABS
1.0000 mg | ORAL_TABLET | Freq: Every day | ORAL | Status: DC
  Administered 2023-11-06 – 2023-11-13 (×6): 1 mg via ORAL
  Filled 2023-11-05 (×6): qty 1

## 2023-11-05 MED ORDER — HYDRALAZINE HCL 20 MG/ML IJ SOLN: 10.0000 mg | Freq: Four times a day (QID) | INTRAMUSCULAR | Status: DC | PRN

## 2023-11-05 MED ORDER — LACTATED RINGERS IV SOLN
INTRAVENOUS | Status: AC
  Administered 2023-11-06: 1000 mL via INTRAVENOUS

## 2023-11-05 MED ORDER — B COMPLEX PO TABS: 1.0000 | ORAL_TABLET | Freq: Every day | ORAL | Status: DC

## 2023-11-05 NOTE — Progress Notes (Signed)
 Unable to get orthostatic vitals, pt is refusing to get up

## 2023-11-05 NOTE — ED Notes (Addendum)
 Unable to get orthostatic VS as patient gets dizzy while attemting getting up from bed.

## 2023-11-05 NOTE — Evaluation (Signed)
 Physical Therapy Evaluation Patient Details Name: Anna Adams MRN: [id number] DOB: 07/10/1934 Today's Date: 11/05/2023  History of Present Illness  Pt is an 89yo female presenting to [facility] ED on 11/04/2023 after being found down on her L side, LKN 10/9 evening; pt does not recall falling. Endorses 2 weeks of back pain and weakness. CT head no acute intracranial abnormality with Soft tissue swelling over the left temporal, posterior frontal, and  supraorbital scalp without underlying fracture or foreign body. CT spine with chronic anterolisthesis at C3-4 with advanced endplate and facet arthropathy and degenerative grade 1 anterolisthesis at C7-T1.  Recent episode of dizziness on 10/24/23  PMH: CKD, HTN, GERD, hypothyroidism, OA, cervical discectomy 1993, L-TKA 2005, R-TKA 2008.  Clinical Impression  Pt admitted with suspected orthostatic hypotension, reports dizziness with any OOB mobility. Pt guided through bed mobility with CGA and increased time with Hob elevated and use of bed rails, pt very fearful of sitting up EOB, performed x2 due to reporting of room spinning, no nystagmus noted during mobility and pt denies symptoms while rolling in bed, see vitals below, absence of orthostatic response. Pt declined any mobility other than dangling EOB despite max encouragement. Pt's discharge destination not set at this time pending progression. Pt currently with functional limitations due to the deficits listed below (see PT Problem List). Pt will benefit from acute skilled PT to increase their independence and safety with mobility to allow discharge.        11/05/23 1400  Orthostatic Lying   BP- Lying 117/69  Orthostatic Sitting  BP- Sitting 141/76  Bp - lying 139/83  BP - sitting 134/79      If plan is discharge home, recommend the following: A little help with walking and/or transfers;A little help with bathing/dressing/bathroom;Assistance with cooking/housework;Assist for transportation;Help with  stairs or ramp for entrance   Can travel by private vehicle        Equipment Recommendations Other (comment) (TBD pending progression)  Recommendations for Other Services       Functional Status Assessment Patient has had a recent decline in their functional status and demonstrates the ability to make significant improvements in function in a reasonable and predictable amount of time.     Precautions / Restrictions Precautions Precautions: Fall Restrictions Weight Bearing Restrictions Per Provider Order: No      Mobility  Bed Mobility Overal bed mobility: Needs Assistance Bed Mobility: Supine to Sit, Sit to Supine     Supine to sit: Contact guard, HOB elevated, Used rails Sit to supine: HOB elevated, Used rails, Contact guard assist   General bed mobility comments: CGA, use of bed rails with HOB elevated, increased time no physical assist required.    Transfers                   General transfer comment: Refused transfers, stating room is spinning attempted x2    Ambulation/Gait               General Gait Details: Pt refused OOB mobility  Stairs            Wheelchair Mobility     Tilt Bed    Modified Rankin (Stroke Patients Only)       Balance Overall balance assessment: Needs assistance Sitting-balance support: Feet unsupported, Bilateral upper extremity supported Sitting balance-Leahy Scale: Fair  Pertinent Vitals/Pain Pain Assessment Pain Assessment: 0-10 Pain Score: 3  Pain Location: low back pain Pain Descriptors / Indicators: Aching Pain Intervention(s): Monitored during session, Repositioned    Home Living Family/patient expects to be discharged to:: Private residence Living Arrangements: Alone Available Help at Discharge: Personal care attendant;Available 24 hours/day (Caregiver 3x/week currently for 1/2 day and could stay over) Type of Home: House Home Access:  Stairs to enter Entrance Stairs-Rails: Left Entrance Stairs-Number of Steps: 3   Home Layout: One level Home Equipment: BSC/3in1;Rolling Walker (2 wheels);Cane - single point;Shower seat;Adaptive equipment;Hand held shower head      Prior Function Prior Level of Function : History of Falls (last six months)             Mobility Comments: SPC at all times ADLs Comments: Caregiver assists with bathing and dressing the past few weeks at SU/CU level, recently daughter has helped with dressing     Extremity/Trunk Assessment   Upper Extremity Assessment Upper Extremity Assessment: Defer to OT evaluation    Lower Extremity Assessment Lower Extremity Assessment: RLE deficits/detail;LLE deficits/detail;Generalized weakness RLE Deficits / Details: MMT grossly 3+/5 bilaterally hip/knee/ankle RLE Sensation: WNL LLE Deficits / Details: MMT grossly 3+/5 bilaterally hip/knee/ankle LLE Sensation: WNL    Cervical / Trunk Assessment Cervical / Trunk Assessment: Kyphotic  Communication   Communication Communication: No apparent difficulties    Cognition Arousal: Alert Behavior During Therapy: WFL for tasks assessed/performed   PT - Cognitive impairments: No apparent impairments                         Following commands: Intact       Cueing Cueing Techniques: Verbal cues     General Comments      Exercises     Assessment/Plan    PT Assessment Patient needs continued PT services  PT Problem List Cardiopulmonary status limiting activity;Decreased strength;Decreased activity tolerance;Decreased balance;Decreased mobility       PT Treatment Interventions DME instruction;Gait training;Stair training;Therapeutic activities;Therapeutic exercise;Functional mobility training;Patient/family education    PT Goals (Current goals can be found in the Care Plan section)  Acute Rehab PT Goals Patient Stated Goal: To feel better PT Goal Formulation: With  patient/family Time For Goal Achievement: 11/19/23 Potential to Achieve Goals: Good    Frequency Min 1X/week     Co-evaluation               AM-PAC PT 6 Clicks Mobility  Outcome Measure Help needed turning from your back to your side while in a flat bed without using bedrails?: A Little Help needed moving from lying on your back to sitting on the side of a flat bed without using bedrails?: A Little Help needed moving to and from a bed to a chair (including a wheelchair)?: Total Help needed standing up from a chair using your arms (e.g., wheelchair or bedside chair)?: Total Help needed to walk in hospital room?: Total Help needed climbing 3-5 steps with a railing? : Total 6 Click Score: 10    End of Session Equipment Utilized During Treatment: Gait belt Activity Tolerance: Other (comment) (Pt very fearful/anxious about sitting and OOB mobility) Patient left: in bed;with call bell/phone within reach;with bed alarm set;with family/visitor present Nurse Communication: Mobility status;Other (comment) (BP) PT Visit Diagnosis: Muscle weakness (generalized) (M62.81);Other abnormalities of gait and mobility (R26.89)    Time: 8664-8582 PT Time Calculation (min) (ACUTE ONLY): 42 min   Charges:   PT Evaluation $PT Eval Low Complexity:  1 Low PT Treatments $Therapeutic Activity: 23-37 mins PT General Charges $$ ACUTE PT VISIT: 1 Visit         Elsie Grieves, PT, DPT [facility] Department Office: [phone]  Elsie Grieves 11/05/2023, 2:18 PM

## 2023-11-05 NOTE — ED Notes (Addendum)
 Unable to do orthostatic VS d/t pt complaint of dizziness on moving from a lying to a sitting position.

## 2023-11-05 NOTE — Progress Notes (Signed)
 Initial Nutrition Assessment  DOCUMENTATION CODES:   Not applicable  INTERVENTION:  Encourage adequate fluid intake throughout the day Keep water available at bedside Encourage intake of floor stock beverages and fruit ices Ensure Plus High Protein po BID, each supplement provides 350 kcal and 20 grams of protein.  Would benefit from in person education on water content of foods and tips for increasing fluid intake  Attached dehydration therapy handout to AVS  Encourage adequate PO intake to meet calorie and protein needs   NUTRITION DIAGNOSIS:   Inadequate oral intake related to decreased appetite as evidenced by per patient/family report, estimated needs. Inadequate intake of fluids  GOAL:   Patient will meet greater than or equal to 90% of their needs  MONITOR:   PO intake, Supplement acceptance  REASON FOR ASSESSMENT:   Consult Assessment of nutrition requirement/status  ASSESSMENT:   Pt with hx CKD, HTN, FTT, and weakness. Admitted following being found down by caretaker w/ dizziness and weakness.  RD consulted to help encourage pt to meet fluid needs. RD working remotely and attempted to reach pt via room phone, but was unsuccessful. Attached dehydration therapy handout to AVS, but would benefit from in person discussion about maintaining fluid status. Wt hx in chart limited, but pt recently seen outpatient and weighed 63 kg so it appears wt has remained stable.  Encourage intake of floor beverages and fruit ices to aid in fluid intake. Encourage sips of water throughout the day. Pt would likely benefit from ONS to help with fluid intake as well as calorie and protein intake.  Labs reviewed  Medications: B complex w/ vitamin C daily Vitamin D3 400 units daily Pepcid  Levothyroxine   Lactated ringers   NUTRITION - FOCUSED PHYSICAL EXAM:  Deferred to follow up  Diet Order:   Diet Order             Diet regular Room service appropriate? Yes; Fluid  consistency: Thin  Diet effective now                   EDUCATION NEEDS:   Not appropriate for education at this time  Skin:  Skin Assessment: Reviewed RN Assessment  Last BM:  10/11  Height:   Ht Readings from Last 1 Encounters:  11/05/23 4' 11 (1.499 m)    Weight:   Wt Readings from Last 1 Encounters:  11/04/23 63 kg    Ideal Body Weight:  44.7 kg  BMI:  Body mass index is 28.07 kg/m.  Estimated Nutritional Needs:   Kcal:  1300-1500  Protein:  50-70g  Fluid:  1.3-1.5L    Josette Glance, MS, RDN, LDN Clinical Dietitian I Please reach out via secure chat

## 2023-11-05 NOTE — Discharge Instructions (Signed)
 [facility]

## 2023-11-05 NOTE — Progress Notes (Signed)
  Echocardiogram 2D Echocardiogram has been performed.  Tinnie FORBES Gosling RDCS 11/05/2023, 3:16 PM

## 2023-11-05 NOTE — ED Notes (Signed)
 Patient had a BM and needed to urine. Patient unable to get out of bed due to getting dizzy.Patient placed on bed pan. Patient cleaned up and new brief placed

## 2023-11-05 NOTE — Progress Notes (Addendum)
 [facility]                                                                              Anna Adams, is a 89 y.o. female, DOB - [birth year], FMW:[id number] Admit date - 11/04/2023    Outpatient Primary MD for the patient is Geofm, Glade PARAS, MD  LOS - 1  days  Chief Complaint  Patient presents with   Fall       Brief summary   Patient is a 89 year old female with CKD, HTN, weakness, adult FTT presented to ED after found down by her caretaker.  Patient was lying on her left side.  She was last seen normal night before and had dinner.  She was on the ground for unknown amount of time.  In the ED, patient could not recall if she tripped and fell or passed out. Son reported that on 9/29 he had brought her to the ED because she was dizzy and could not sit up in the bed.  Patient was given some IV fluids, treated for UTI and doing okay after that until the day of admission.  He also could not get patient does not drink very much, lives alone and has people check on her a couple of times a week. In ED patient was symptomatic just sitting up in the bed, felt dizzy and weak, admitted for further workup  Assessment & Plan       Orthostatic hypotension, dizziness - Obtain formal orthostatic vitals, patient has received IV fluids - Hold furosemide , continue IVF.  - Anemia panel within normal limits, B12 465, TSH 1.8 - Follow 2D echo, cortisol level - Per patient, now she remembers that she slipped on something and she fell, may have hit her head.  CT head and C-spine showed no acute intracranial abnormality, soft tissue swelling over the left temporal posterior frontal and supraorbital scalp with no fracture. - PT OT evaluation - d/w the son, she takes lasix  for feet swelling, no longer on amlodipine . Was started on lasix  a year ago. Recommended to use lasix  20mg  as needed for swelling, not 40mg  daily.     Fall at home, initial encounter -PT OT  evaluation   Hypertension -Continue irbesartan,  - hold furosemide ,  - hydralazine IV as needed with parameters  Hyperlipidemia - Continue statin  Generalized debility, FTT - PT OT evaluation recommended HHPT vs SNF  Discussed in detail with patient's son, she has been living alone independently with people checking on her couple of times a week. She has been having recent falls, generalized weakness and will benefit from short term rehab. Meanwhile, family will try to arrange more help at home vs looking into ALF as more permanent solution for supervised setting.  - will place [facility] consult   GERD - Continue famotidine   CKD stage IIIb - Currently creatinine at baseline    Estimated body mass index is 28.07 kg/m as calculated from the following:   Height as of this encounter: 4' 11 (1.499 m).   Weight as of this encounter: 63 kg.  Code Status: Full code DVT Prophylaxis:  enoxaparin (LOVENOX) injection 40 mg Start:  11/04/23 2200   Level of Care: Level of care: Telemetry Family Communication: Updated patient's son, Mauria Asquith [phone] on the phone  Disposition Plan:      Remains inpatient appropriate:      Procedures:    Consultants:     Antimicrobials:   Anti-infectives (From admission, onward)    None          Medications  enoxaparin (LOVENOX) injection  40 mg Subcutaneous Q24H   irbesartan  75 mg Oral Daily   latanoprost  1 drop Both Eyes QHS   levothyroxine   125 mcg Oral Q0600   sodium chloride flush  3 mL Intravenous Q12H   sodium chloride flush  3 mL Intravenous Q12H      Subjective:   Jetty Crisanti was seen and examined today.  Feeling better however has not walked yet.  No dizziness on lying down.  Patient denies  chest pain, shortness of breath, abdominal pain, N/V/D/C, new weakness  Objective:   Vitals:   11/05/23 0300 11/05/23 0303 11/05/23 0430 11/05/23 0737  BP: 127/66  (!) 141/76 134/82  Pulse: 72 73 71 79  Resp: 20 20 19  (!) 25   Temp: 98.4 F (36.9 C)   97.8 F (36.6 C)  TempSrc: Oral   Oral  SpO2: (!) 89% 93% 100% 97%  Weight:      Height:        Intake/Output Summary (Last 24 hours) at 11/05/2023 1022 Last data filed at 11/05/2023 0908 Gross per 24 hour  Intake 10 ml  Output --  Net 10 ml     Wt Readings from Last 3 Encounters:  11/04/23 63 kg  11/02/23 63 kg  05/25/23 63 kg     Exam General: Alert and oriented x 3, NAD Cardiovascular: S1 S2 auscultated,  RRR Respiratory: Clear to auscultation bilaterally, no wheezing Gastrointestinal: Soft, nontender, nondistended, + bowel sounds Ext: no pedal edema bilaterally Neuro: No new deficits Psych: Normal affect     Data Reviewed:  I have personally reviewed following labs    CBC Lab Results  Component Value Date   WBC 8.5 11/05/2023   RBC 3.86 (L) 11/05/2023   RBC 3.83 (L) 11/05/2023   HGB 11.0 (L) 11/05/2023   HCT 35.8 (L) 11/05/2023   MCV 93.5 11/05/2023   MCH 28.7 11/05/2023   PLT 222 11/05/2023   MCHC 30.7 11/05/2023   RDW 14.0 11/05/2023   LYMPHSABS 0.8 11/04/2023   MONOABS 0.7 11/04/2023   EOSABS 0.0 11/04/2023   BASOSABS 0.1 11/04/2023     Last metabolic panel Lab Results  Component Value Date   NA 136 11/05/2023   K 4.3 11/05/2023   CL 99 11/05/2023   CO2 31 11/05/2023   BUN 17 11/05/2023   CREATININE 0.74 11/05/2023   GLUCOSE 86 11/05/2023   GFRNONAA >60 11/05/2023   GFRAA 88 12/31/2019   CALCIUM  10.2 11/05/2023   PROT 6.2 (L) 11/04/2023   ALBUMIN 3.7 11/04/2023   BILITOT 0.6 11/04/2023   ALKPHOS 97 11/04/2023   AST 72 (H) 11/04/2023   ALT 41 11/04/2023   ANIONGAP 7 11/05/2023    CBG (last 3)  Recent Labs    11/04/23 1450 11/05/23 0537  GLUCAP 111* 77      Coagulation Profile: No results for input(s): INR, PROTIME in the last 168 hours.   Radiology Studies: I have personally reviewed the imaging studies  CT CERVICAL SPINE WO CONTRAST Result Date: 11/04/2023 EXAM: CT CERVICAL SPINE  WITHOUT CONTRAST 11/04/2023 03:38:30  PM TECHNIQUE: CT of the cervical spine was performed without the administration of intravenous contrast. Multiplanar reformatted images are provided for review. Automated exposure control, iterative reconstruction, and/or weight based adjustment of the mA/kV was utilized to reduce the radiation dose to as low as reasonably achievable. COMPARISON: None available. CLINICAL HISTORY: Neck trauma (Age >= 65y). BIB EMS 2 weeks of back pain and weakness. [place] today and could not get up due to the weakness. Redness on left cheek from fall. Did not hit head, no thinners 64-166/82-CBG 135. FINDINGS: CERVICAL SPINE: BONES AND ALIGNMENT: No acute fracture or traumatic malalignment. Overall straightening of the normal cervical lordosis is present. DEGENERATIVE CHANGES: 2 mm anterolisthesis at C3-C4 appears chronic with advanced endplate and facet changes. Chronic endplate changes and ankylosis are present at C4-C5 and C5-C6. Chronic endplate changes are present at C6-C7. Grade 1 anterolisthesis at C7-T1 is degenerative. Asymmetric posterior facet arthropathy is present on the right at C3-C4. SOFT TISSUES: No prevertebral soft tissue swelling. IMPRESSION: 1. No acute abnormality of the cervical spine related to the reported neck trauma. 2. Chronic anterolisthesis at C3-4 with advanced endplate and facet arthropathy. 3. Degenerative grade 1 anterolisthesis at C7-T1. Electronically signed by: Lonni Necessary MD 11/04/2023 03:48 PM EDT RP Workstation: [id number]   CT HEAD WO CONTRAST Result Date: 11/04/2023 EXAM: CT HEAD WITHOUT CONTRAST 11/04/2023 03:38:30 PM TECHNIQUE: CT of the head was performed without the administration of intravenous contrast. Automated exposure control, iterative reconstruction, and/or weight based adjustment of the mA/kV was utilized to reduce the radiation dose to as low as reasonably achievable. COMPARISON: None available. CLINICAL HISTORY: Minor head trauma  (age >= 65 years). Patient presented via EMS with 2 weeks of back pain and weakness, fell today and could not get up. Redness on the left cheek from the fall; patient did not hit head and is not on thinners. FINDINGS: BRAIN AND VENTRICLES: Moderate atrophy and white matter changes are noted bilaterally. Atherosclerotic calcifications are present in the cavernous carotid arteries bilaterally. No hyperdense vessel is present. No acute hemorrhage. No evidence of acute infarct. No hydrocephalus. No extra-axial collection. No mass effect or midline shift. ORBITS: No acute abnormality. SINUSES: Mild mucosal thickening is present in the right sphenoid sinus. Minimal fluid is present in the left maxillary sinus without associated fracture. SOFT TISSUES AND SKULL: Soft tissue swelling is present over the left temporal and posterior frontal scalp without underlying fracture or foreign body. Supraorbital scalp soft tissue swelling is present without underlying fracture or foreign body. No skull fracture. IMPRESSION: 1. No acute intracranial abnormality. 2. Soft tissue swelling over the left temporal, posterior frontal, and supraorbital scalp without underlying fracture or foreign body. Electronically signed by: Lonni Necessary MD 11/04/2023 03:44 PM EDT RP Workstation: [id number]   DG Chest Port 1 View Result Date: 11/04/2023 CLINICAL DATA:  Left-sided rib tenderness. Back pain and weakness for 2 weeks. Fall. EXAM: PORTABLE CHEST 1 VIEW COMPARISON:  10/24/2023 FINDINGS: Shallow inspiration. Large left diaphragmatic hernia, possibly hiatal or left hemidiaphragm. This contains stool filled colon with similar appearance to previous study. Atelectasis in the left lung base. Right lung is clear. Cardiac enlargement. Degenerative changes in the spine and shoulders. IMPRESSION: Large left diaphragmatic or hiatal hernia containing colon with similar appearance to previous study. Cardiac enlargement. Atelectasis in the left  base. Electronically Signed   By: Elsie Gravely M.D.   On: 11/04/2023 15:17       Lashawne Dura M.D. [facility] 11/05/2023, 10:22 AM  Available via  Epic secure chat 7am-7pm After 7 pm, please refer to night coverage provider listed on amion.

## 2023-11-06 DIAGNOSIS — R55 Syncope and collapse: Secondary | ICD-10-CM

## 2023-11-06 DIAGNOSIS — N3000 Acute cystitis without hematuria: Secondary | ICD-10-CM

## 2023-11-06 DIAGNOSIS — Y92009 Unspecified place in unspecified non-institutional (private) residence as the place of occurrence of the external cause: Secondary | ICD-10-CM | POA: Diagnosis not present

## 2023-11-06 DIAGNOSIS — N390 Urinary tract infection, site not specified: Secondary | ICD-10-CM | POA: Diagnosis present

## 2023-11-06 DIAGNOSIS — W19XXXA Unspecified fall, initial encounter: Secondary | ICD-10-CM | POA: Diagnosis not present

## 2023-11-06 LAB — CBC
HCT: 34.7 % — ABNORMAL LOW (ref 36.0–46.0)
Hemoglobin: 10.4 g/dL — ABNORMAL LOW (ref 12.0–15.0)
MCH: 29 pg (ref 26.0–34.0)
MCHC: 30 g/dL (ref 30.0–36.0)
MCV: 96.7 fL (ref 80.0–100.0)
Platelets: 190 K/uL (ref 150–400)
RBC: 3.59 MIL/uL — ABNORMAL LOW (ref 3.87–5.11)
RDW: 14.2 % (ref 11.5–15.5)
WBC: 6.8 K/uL (ref 4.0–10.5)
nRBC: 0 % (ref 0.0–0.2)

## 2023-11-06 LAB — RENAL FUNCTION PANEL
Albumin: 3.3 g/dL — ABNORMAL LOW (ref 3.5–5.0)
Anion gap: 5 (ref 5–15)
BUN: 15 mg/dL (ref 8–23)
CO2: 31 mmol/L (ref 22–32)
Calcium: 10 mg/dL (ref 8.9–10.3)
Chloride: 103 mmol/L (ref 98–111)
Creatinine, Ser: 0.7 mg/dL (ref 0.44–1.00)
GFR, Estimated: >60 mL/min (ref >60)
Glucose, Bld: 78 mg/dL (ref 70–99)
Phosphorus: 2.8 mg/dL (ref 2.5–4.6)
Potassium: 4.2 mmol/L (ref 3.5–5.1)
Sodium: 139 mmol/L (ref 135–145)

## 2023-11-06 LAB — CORTISOL-AM, BLOOD: Cortisol - AM: 9.3 ug/dL (ref 6.7–22.6)

## 2023-11-06 LAB — GLUCOSE, CAPILLARY: Glucose-Capillary: 74 mg/dL (ref 70–99)

## 2023-11-06 MED ORDER — SODIUM CHLORIDE 0.9 % IV SOLN
1.0000 g | INTRAVENOUS | Status: DC
  Administered 2023-11-06 – 2023-11-07 (×2): 1 g via INTRAVENOUS
  Filled 2023-11-06 (×3): qty 10

## 2023-11-06 NOTE — Plan of Care (Signed)
   Problem: Education: Goal: Knowledge of condition and prescribed therapy will improve Outcome: Progressing   Problem: Cardiac: Goal: Will achieve and/or maintain adequate cardiac output Outcome: Progressing

## 2023-11-06 NOTE — Progress Notes (Signed)
 Physical Therapy Treatment Patient Details Name: Anna Adams MRN: [id number] DOB: 24-Mar-1934 Today's Date: 11/06/2023   History of Present Illness Pt is an 89yo female presenting to [facility] ED on 11/04/2023 after being found down on her L side, LKN 10/9 evening; pt does not recall falling. Endorses 2 weeks of back pain and weakness. CT head no acute intracranial abnormality with Soft tissue swelling over the left temporal, posterior frontal, and  supraorbital scalp without underlying fracture or foreign body. CT spine with chronic anterolisthesis at C3-4 with advanced endplate and facet arthropathy and degenerative grade 1 anterolisthesis at C7-T1.  Recent episode of dizziness on 10/24/23  PMH: CKD, HTN, GERD, hypothyroidism, OA, cervical discectomy 1993, L-TKA 2005, R-TKA 2008.    PT Comments  AxO x 3 pleasant Lady following all directions.  Over all feels weary.  Unable to recall events that brought her here.  Son present during session.  Prior Pt was living home alone IND with a care giver part time.  Assisted OOB to [facility] and took Orthostatic vitals: Supine            BP 141/70, HR 64 3lts 96% EOB                BP 148/74 HR 77 RA 89% Standing         BP 148/89, HR 86 RA 86% Amb 6 feet      RA 81% Dyspnea 2/4 reapplied 3 lts to achieve sats >90%  General bed mobility comments: increased time and effort esp to complete scooting to EOB.  Max c/o fatigue.  Able to static sit EOB at Mod Indep.  No c/o dizziness but did stated she felt weak. General transfer comment: Required increased time and VC's on proper hand placement as well as turn completion prior to sit when assisted from bed to [facility] to void.  Unsteady. General Gait Details: Assisted with amb a limited distance due to fatigue/weakness and increased dyspnea.  Ra decreased from 90% at rest to 81%.  Reapplied 3 lts to achieve sats >90%.  Returned to room via recliner.  Family would like to persue ST Rehab at SNF as Pt lives home alone.  Will  update LPT.      If plan is discharge home, recommend the following: A little help with walking and/or transfers;A little help with bathing/dressing/bathroom;Assistance with cooking/housework;Assist for transportation;Help with stairs or ramp for entrance   Can travel by private vehicle     Yes  Equipment Recommendations  Other (comment) (TBD)    Recommendations for Other Services       Precautions / Restrictions Precautions Precautions: Fall Precaution/Restrictions Comments: monitor vitals Restrictions Weight Bearing Restrictions Per Provider Order: No     Mobility  Bed Mobility Overal bed mobility: Needs Assistance Bed Mobility: Supine to Sit     Supine to sit: Min assist     General bed mobility comments: increased time and effort esp to complete scooting to EOB.  Max c/o fatigue.  Able to static sit EOB at Mod Indep.  No c/o dizziness but did stated she felt weak.    Transfers Overall transfer level: Needs assistance Equipment used: Rolling walker (2 wheels) Transfers: Sit to/from Stand Sit to Stand: Min assist, Contact guard assist           General transfer comment: Required increased time and VC's on proper hand placement as well as turn completion prior to sit when assisted from bed to [facility] to void.  Unsteady.  Ambulation/Gait Ambulation/Gait assistance: Min assist, +2 physical assistance Gait Distance (Feet): 18 Feet   Gait Pattern/deviations: Step-through pattern, Decreased stride length Gait velocity: decreased     General Gait Details: Assisted with amb a limited distance due to fatigue/weakness and increased dyspnea.  Ra decreased from 90% at rest to 81%.  Reapplied 3 lts to achieve sats >90%.  Returned to room via recliner.   Stairs             Wheelchair Mobility     Tilt Bed    Modified Rankin (Stroke Patients Only)       Balance                                            Communication  Communication Communication: Impaired Factors Affecting Communication: Hearing impaired  Cognition Arousal: Alert Behavior During Therapy: WFL for tasks assessed/performed   PT - Cognitive impairments: No apparent impairments                       PT - Cognition Comments: AxO x 3 pleasant Lady following all directions.  Over all feels weary.  Unable to recall events that brought her here.  Son present during session.  Prior Pt was living home alone IND with a care giver part time. Following commands: Intact      Cueing Cueing Techniques: Verbal cues  Exercises      General Comments General comments (skin integrity, edema, etc.): Pt recieved on 3L O2 via [place]. Pt does not use O2 at baseline. SpO2 on 3L 93%+ during mobility, Spo2 91% on 2L at rest. Pt left on 3L. Pt denies dizziness throughout.      Pertinent Vitals/Pain Pain Assessment Pain Assessment: No/denies pain    Home Living Family/patient expects to be discharged to:: Private residence Living Arrangements: Alone Available Help at Discharge: Personal care attendant;Available 24 hours/day (Caregiver 3x/week currently for 1/2 day and could stay over) Type of Home: House Home Access: Stairs to enter Entrance Stairs-Rails: Left Entrance Stairs-Number of Steps: 3   Home Layout: One level Home Equipment: BSC/3in1;Rolling Walker (2 wheels);Cane - single point;Shower seat;Adaptive equipment;Hand held shower head      Prior Function            PT Goals (current goals can now be found in the care plan section) Progress towards PT goals: Progressing toward goals    Frequency    Min 1X/week      PT Plan      Co-evaluation              AM-PAC PT 6 Clicks Mobility   Outcome Measure  Help needed turning from your back to your side while in a flat bed without using bedrails?: A Lot Help needed moving from lying on your back to sitting on the side of a flat bed without using bedrails?: A Lot Help  needed moving to and from a bed to a chair (including a wheelchair)?: A Lot Help needed standing up from a chair using your arms (e.g., wheelchair or bedside chair)?: A Lot Help needed to walk in hospital room?: A Lot Help needed climbing 3-5 steps with a railing? : Total 6 Click Score: 11    End of Session Equipment Utilized During Treatment: Gait belt Activity Tolerance: Patient limited by fatigue Patient left: in chair;with call bell/phone within reach;with chair  alarm set;with family/visitor present Nurse Communication: Mobility status PT Visit Diagnosis: Muscle weakness (generalized) (M62.81);Other abnormalities of gait and mobility (R26.89)     Time: 9041-8976 PT Time Calculation (min) (ACUTE ONLY): 25 min  Charges:    $Gait Training: 8-22 mins $Therapeutic Activity: 8-22 mins PT General Charges $$ ACUTE PT VISIT: 1 Visit                     Katheryn Leap  PTA Acute  Rehabilitation Services Office M-F          [phone]

## 2023-11-06 NOTE — Evaluation (Signed)
 Occupational Therapy Evaluation Patient Details Name: Anna Adams MRN: [id number] DOB: [birth year] Today's Date: 11/06/2023   History of Present Illness   Pt is an 89yo female presenting to [facility] ED on 11/04/2023 after being found down on her L side, LKN 10/9 evening; pt does not recall falling. Endorses 2 weeks of back pain and weakness. CT head no acute intracranial abnormality with Soft tissue swelling over the left temporal, posterior frontal, and  supraorbital scalp without underlying fracture or foreign body. CT spine with chronic anterolisthesis at C3-4 with advanced endplate and facet arthropathy and degenerative grade 1 anterolisthesis at C7-T1.  Recent episode of dizziness on 10/24/23  PMH: CKD, HTN, GERD, hypothyroidism, OA, cervical discectomy 1993, L-TKA 2005, R-TKA 2008.     Clinical Impressions Pt admitted with above. Prior to admission, pt lives alone with intermittent assist from caregiver 3x weekly. Pt uses RW or SPC for mobility and daughter has been assisting with dressing. Pt denies dizziness throughout OT evaluation, received on 3L O2. Pt does not use O2 at baseline. SpO2 on 3L 93%+ during mobility, Spo2 91% on 2L at rest. Pt left on 3L via [place]. NT in room during session, providing +2 for safety / line mgmt. Pt able to complete functional STS transfers and mobility t/f bathroom using RW with up to MIN A, cues for hand placement and AD proximity. MIN A for clothing management after continent urine void, and pt able to perform pericare seated on BSC bathroom level with supervision / lateral leans. Pt presents with decreased balance, generalized weakness and poor functional activity tolerance. Pt would benefit from skilled OT services to address noted impairments and functional limitations (see below for any additional details) in order to maximize safety and independence while minimizing falls risk and caregiver burden. Anticipate the need for follow up OT services upon acute hospital DC.  Patient will benefit from continued inpatient follow up therapy, <3 hours/day      If plan is discharge home, recommend the following:   A little help with walking and/or transfers;A lot of help with bathing/dressing/bathroom;Direct supervision/assist for financial management;Direct supervision/assist for medications management;Assist for transportation;Help with stairs or ramp for entrance;Supervision due to cognitive status     Functional Status Assessment   Patient has had a recent decline in their functional status and demonstrates the ability to make significant improvements in function in a reasonable and predictable amount of time.     Equipment Recommendations   BSC/3in1     Recommendations for Other Services         Precautions/Restrictions   Precautions Precautions: Fall Restrictions Weight Bearing Restrictions Per Provider Order: No     Mobility Bed Mobility Overal bed mobility: Needs Assistance             General bed mobility comments: NT, pt recieved and left in recliner    Transfers Overall transfer level: Needs assistance Equipment used: Rolling walker (2 wheels) Transfers: Sit to/from Stand, Bed to chair/wheelchair/BSC Sit to Stand: Contact guard assist     Step pivot transfers: Min assist, +2 safety/equipment     General transfer comment: pt completes multiple STS transfers using RW, +2 for safety with IV pole mgmt and O2 follow. cues for hand placement and sequencing      Balance Overall balance assessment: Needs assistance, History of Falls Sitting-balance support: Feet unsupported, Bilateral upper extremity supported Sitting balance-Leahy Scale: Fair     Standing balance support: Single extremity supported, During functional activity Standing balance-Leahy Scale: Fair  Standing balance comment: fair dynamic balance standing at sink to wash hands                           ADL either performed or assessed with  clinical judgement   ADL Overall ADL's : Needs assistance/impaired     Grooming: Standing;Wash/dry hands;Contact guard assist Grooming Details (indicate cue type and reason): sink level in bathroom Upper Body Bathing: Minimal assistance;Sitting   Lower Body Bathing: Maximal assistance;Sit to/from stand   Upper Body Dressing : Sitting;Minimal assistance   Lower Body Dressing: Sit to/from stand;Maximal assistance   Toilet Transfer: Minimal assistance;BSC/3in1;Rolling walker (2 wheels);Ambulation;Comfort height toilet;Cueing for sequencing;Cueing for safety;+2 for safety/equipment Toilet Transfer Details (indicate cue type and reason): BSC over standard commode in bathroom, cues for hand placement and AD proximity Toileting- Clothing Manipulation and Hygiene: Minimal assistance;Sit to/from stand Toileting - Clothing Manipulation Details (indicate cue type and reason): MIN A to manage LB clothing from STS, pt able to perform hygiene from lateral leans while seated on BSC     Functional mobility during ADLs: Minimal assistance;Rolling walker (2 wheels);Cueing for safety;Cueing for sequencing;+2 for safety/equipment General ADL Comments: pt recieved on 3L O2, requesting to use bathroom. NT assisting with IV pole mgmt     Vision Baseline Vision/History: 1 Wears glasses Ability to See in Adequate Light: 0 Adequate Patient Visual Report: No change from baseline              Pertinent Vitals/Pain Pain Assessment Pain Assessment: No/denies pain Pain Score: 0-No pain     Extremity/Trunk Assessment Upper Extremity Assessment Upper Extremity Assessment: Generalized weakness;Right hand dominant   Lower Extremity Assessment Lower Extremity Assessment: Defer to PT evaluation   Cervical / Trunk Assessment Cervical / Trunk Assessment: Kyphotic   Communication Communication Communication: Impaired Factors Affecting Communication: Hearing impaired   Cognition Arousal: Alert Behavior  During Therapy: WFL for tasks assessed/performed Cognition: Cognition impaired   Orientation impairments: Situation Awareness: Intellectual awareness impaired Memory impairment (select all impairments): Short-term memory, Working memory Attention impairment (select first level of impairment): Sustained attention Executive functioning impairment (select all impairments): Problem solving, Reasoning OT - Cognition Comments: decreased insight into deficits and poor judgement                 Following commands: Intact       Cueing  General Comments   Cueing Techniques: Verbal cues  Pt recieved on 3L O2 via [place]. Pt does not use O2 at baseline. SpO2 on 3L 93%+ during mobility, Spo2 91% on 2L at rest. Pt left on 3L. Pt denies dizziness throughout.           Home Living Family/patient expects to be discharged to:: Private residence Living Arrangements: Alone Available Help at Discharge: Personal care attendant;Available 24 hours/day (Caregiver 3x/week currently for 1/2 day and could stay over) Type of Home: House Home Access: Stairs to enter [employer] of Steps: 3 Entrance Stairs-Rails: Left Home Layout: One level     Bathroom Shower/Tub: Producer, television/film/video: Handicapped height     Home Equipment: Teacher, English as a foreign language (2 wheels);Cane - single point;Shower seat;Adaptive equipment;Hand held shower head Adaptive Equipment: Long-handled sponge        Prior Functioning/Environment Prior Level of Function : History of Falls (last six months);Needs assist             Mobility Comments: SPC at all times ADLs Comments: Caregiver assists with bathing and dressing the  past few weeks at SU/CU level, recently daughter has helped with dressing    OT Problem List: Cardiopulmonary status limiting activity;Decreased strength;Decreased range of motion;Decreased activity tolerance;Impaired balance (sitting and/or standing);Decreased cognition   OT  Treatment/Interventions: Self-care/ADL training;Therapeutic exercise;Neuromuscular education;DME and/or AE instruction;Therapeutic activities;Patient/family education;Balance training      OT Goals(Current goals can be found in the care plan section)   Acute Rehab OT Goals OT Goal Formulation: With patient/family Time For Goal Achievement: 11/20/23 Potential to Achieve Goals: Good   OT Frequency:  Min 2X/week       AM-PAC OT 6 Clicks Daily Activity     Outcome Measure Help from another person eating meals?: None Help from another person taking care of personal grooming?: A Little Help from another person toileting, which includes using toliet, bedpan, or urinal?: A Lot Help from another person bathing (including washing, rinsing, drying)?: A Lot Help from another person to put on and taking off regular upper body clothing?: A Little Help from another person to put on and taking off regular lower body clothing?: A Lot 6 Click Score: 16   End of Session Equipment Utilized During Treatment: Gait belt;Rolling walker (2 wheels);Oxygen Nurse Communication: Mobility status  Activity Tolerance: Patient tolerated treatment well;No increased pain Patient left: in chair;with call bell/phone within reach;with chair alarm set;with family/visitor present  OT Visit Diagnosis: Muscle weakness (generalized) (M62.81);Other abnormalities of gait and mobility (R26.89);Unsteadiness on feet (R26.81);History of falling (Z91.81)                Time: 8862-8797 OT Time Calculation (min): 25 min Charges:  OT General Charges $OT Visit: 1 Visit OT Evaluation $OT Eval Low Complexity: 1 Low OT Treatments $Self Care/Home Management : 8-22 mins  Titus Drone L. Marquita Lias, OTR/L  11/06/23, 1:05 PM

## 2023-11-06 NOTE — Progress Notes (Signed)
 PHYSICAL THERAPY  Assisted OOB take [facility].  See vitals in EPIC.  RA did drop to 81% with increased dyspnea with sitting on BSC.  Applied 3 lts to achieve sats >90%.  Pt amb 12 feet Mod c/o fatigue.  Son present during session.  Katheryn Leap  PTA Acute  Rehabilitation Services Office M-F          [phone]

## 2023-11-06 NOTE — Progress Notes (Signed)
 [facility]                                                                              Anna Adams, is a 89 y.o. female, DOB - [birth year], FMW:[id number] Admit date - 11/04/2023    Outpatient Primary MD for the patient is Geofm, Glade PARAS, MD  LOS - 2  days  Chief Complaint  Patient presents with   Fall       Brief summary   Patient is a 89 year old female with CKD, HTN, weakness, adult FTT presented to ED after found down by her caretaker.  Patient was lying on her left side.  She was last seen normal night before and had dinner.  She was on the ground for unknown amount of time.  In the ED, patient could not recall if she tripped and fell or passed out. Son reported that on 9/29 he had brought her to the ED because she was dizzy and could not sit up in the bed.  Patient was given some IV fluids, treated for UTI and doing okay after that until the day of admission.  He also could not get patient does not drink very much, lives alone and has people check on her a couple of times a week. In ED patient was symptomatic just sitting up in the bed, felt dizzy and weak, admitted for further workup  Assessment & Plan       Orthostatic hypotension, dizziness - Continue to hold furosemide , received IVF  - Anemia panel within normal limits, B12 465, TSH 1.8 - A.m. cortisol level normal - Per patient, now she remembers that she slipped on something and she fell, may have hit her head.  CT head and C-spine showed no acute intracranial abnormality, soft tissue swelling over the left temporal posterior frontal and supraorbital scalp with no fracture. -2D echo showed EF 70 to 75%, no acute valve stenosis - PT OT evaluation recommended HPT versus SNF. - d/w the son, she takes lasix  for feet swelling, no longer on amlodipine . Was started on lasix  a year ago. Recommended to use lasix  20mg  as needed for swelling, not 40mg  daily.   Pseudomonas urinary tract infection - Was contacted  this morning by patient's PCP, Dr. Geofm that outpatient urine culture PTA 10/8 showed Pseudomonas.  Unclear if this was done for symptomatic UTI.  Patient had complained of generalized weakness. - Will place on IV cefepime per sensitivities    Fall at home, initial encounter -PT OT evaluation   Hypertension - Continue irbesartan, hold furosemide  - hydralazine IV as needed with parameters  Hyperlipidemia - Continue statin  Generalized debility, FTT - PT OT evaluation recommended HHPT vs SNF  -she has been living alone independently with people checking on her couple of times a week, does not have 24/7 supervision. She has been having recent falls, generalized weakness and will benefit from short term rehab. Meanwhile, family will try to arrange more help at home vs looking into ALF as more permanent solution for supervised setting.  - TOC consult  GERD - Continue famotidine   CKD stage IIIb - Currently creatinine at baseline  Estimated body mass index is 29.55 kg/m as calculated from the following:   Height as of this encounter: 4' 11 (1.499 m).   Weight as of this encounter: 66.4 kg.  Code Status: Full code DVT Prophylaxis:  enoxaparin (LOVENOX) injection 40 mg Start: 11/04/23 2200   Level of Care: Level of care: Telemetry Family Communication: Updated patient's son, Laiklynn Raczynski [phone] on the phone on 10/11, discussed with patient's daughter today Disposition Plan:      Remains inpatient appropriate:      Procedures:    Consultants:     Antimicrobials:   Anti-infectives (From admission, onward)    None          Medications  atorvastatin   20 mg Oral QHS   B-complex with vitamin C  1 tablet Oral Daily   cholecalciferol  400 Units Oral Daily   enoxaparin (LOVENOX) injection  40 mg Subcutaneous Q24H   famotidine   40 mg Oral QHS   feeding supplement  237 mL Oral BID BM   folic acid  1 mg Oral Daily   irbesartan  75 mg Oral Daily   latanoprost  1  drop Both Eyes QHS   levothyroxine   125 mcg Oral Q0600   sodium chloride flush  3 mL Intravenous Q12H   sodium chloride flush  3 mL Intravenous Q12H      Subjective:   Parrish Facer was seen and examined today.  Feeling better but still very weak.  No fever chills, chest pain or shortness of breath.   Objective:   Vitals:   11/05/23 1624 11/05/23 2051 11/06/23 0308 11/06/23 0453  BP: 124/66 116/66 (!) 111/58   Pulse: 87 74 66   Resp:  18 15   Temp: 98.5 F (36.9 C) 98 F (36.7 C) 98.3 F (36.8 C)   TempSrc: Oral     SpO2: 92% 93% 93%   Weight:    66.4 kg  Height:       No intake or output data in the 24 hours ending 11/06/23 1131    Wt Readings from Last 3 Encounters:  11/06/23 66.4 kg  11/02/23 63 kg  05/25/23 63 kg   Physical Exam General: Alert and oriented x 3, NAD, ill-appearing, deconditioned Cardiovascular: S1 S2 clear, RRR.  Respiratory: CTAB Gastrointestinal: Soft, nontender, nondistended, NBS Ext: no pedal edema bilaterally Neuro: no new deficits Psych: Normal affect    Data Reviewed:  I have personally reviewed following labs    CBC Lab Results  Component Value Date   WBC 6.8 11/06/2023   RBC 3.59 (L) 11/06/2023   HGB 10.4 (L) 11/06/2023   HCT 34.7 (L) 11/06/2023   MCV 96.7 11/06/2023   MCH 29.0 11/06/2023   PLT 190 11/06/2023   MCHC 30.0 11/06/2023   RDW 14.2 11/06/2023   LYMPHSABS 0.8 11/04/2023   MONOABS 0.7 11/04/2023   EOSABS 0.0 11/04/2023   BASOSABS 0.1 11/04/2023     Last metabolic panel Lab Results  Component Value Date   NA 139 11/06/2023   K 4.2 11/06/2023   CL 103 11/06/2023   CO2 31 11/06/2023   BUN 15 11/06/2023   CREATININE 0.70 11/06/2023   GLUCOSE 78 11/06/2023   GFRNONAA >60 11/06/2023   GFRAA 88 12/31/2019   CALCIUM  10.0 11/06/2023   PHOS 2.8 11/06/2023   PROT 6.2 (L) 11/04/2023   ALBUMIN 3.3 (L) 11/06/2023   BILITOT 0.6 11/04/2023   ALKPHOS 97 11/04/2023   AST 72 (H) 11/04/2023   ALT 41 11/04/2023  ANIONGAP 5 11/06/2023    CBG (last 3)  Recent Labs    11/05/23 0537 11/05/23 1619 11/05/23 2048  GLUCAP 77 122* 94      Coagulation Profile: No results for input(s): INR, PROTIME in the last 168 hours.   Radiology Studies: I have personally reviewed the imaging studies  ECHOCARDIOGRAM COMPLETE Result Date: 11/05/2023    ECHOCARDIOGRAM REPORT   Patient Name:   LYNDSIE WALLMAN Date of Exam: 11/05/2023 Medical Rec #:  [id number]    Height:       59.0 in Accession #:    [id number]   Weight:       139.0 lb Date of Birth:  December 31, 1934    BSA:          1.580 m Patient Age:    89 years     BP:           134/84 mmHg Patient Gender: F            HR:           72 bpm. Exam Location:  Inpatient Procedure: 2D Echo, Cardiac Doppler and Color Doppler (Both Spectral and Color            Flow Doppler were utilized during procedure). Indications:    Syncope R55  History:        Patient has prior history of Echocardiogram examinations, most                 recent 06/01/2021.  Sonographer:    Tinnie Gosling RDCS Referring Phys: [phone] THERESA C SHEEHAN  Sonographer Comments: Technically difficult study due to poor echo windows. IMPRESSIONS  1. Left ventricular ejection fraction, by estimation, is 70 to 75%. The left ventricle has hyperdynamic function. Left ventricular endocardial border not optimally defined to evaluate regional wall motion. Left ventricular diastolic parameters are indeterminate. Elevated left atrial pressure.  2. Right ventricular systolic function was not well visualized. The right ventricular size is not well visualized. Tricuspid regurgitation signal is inadequate for assessing PA pressure.  3. The mitral valve is degenerative. Trivial mitral valve regurgitation.  4. The aortic valve was not well visualized. Aortic valve regurgitation is not visualized.  5. The inferior vena cava is normal in size with greater than 50% respiratory variability, suggesting right atrial pressure of 3 mmHg.  Comparison(s): Extremely limited images. LVEF approximately 70-75%. FINDINGS  Left Ventricle: Left ventricular ejection fraction, by estimation, is 70 to 75%. The left ventricle has hyperdynamic function. Left ventricular endocardial border not optimally defined to evaluate regional wall motion. The left ventricular internal cavity size was normal in size. There is no left ventricular hypertrophy. Left ventricular diastolic parameters are indeterminate. Elevated left atrial pressure. Right Ventricle: The right ventricular size is not well visualized. Right vetricular wall thickness was not well visualized. Right ventricular systolic function was not well visualized. Tricuspid regurgitation signal is inadequate for assessing PA pressure. Left Atrium: Left atrial size was normal in size. Right Atrium: Right atrial size was normal in size. Pericardium: There is no evidence of pericardial effusion. Presence of epicardial fat layer. Mitral Valve: The mitral valve is degenerative in appearance. Trivial mitral valve regurgitation. Tricuspid Valve: The tricuspid valve is not well visualized. Tricuspid valve regurgitation is trivial. Aortic Valve: The aortic valve was not well visualized. Aortic valve regurgitation is not visualized. Pulmonic Valve: The pulmonic valve was not well visualized. Pulmonic valve regurgitation is not visualized. Aorta: The aortic root was not well visualized. Venous: The inferior  vena cava is normal in size with greater than 50% respiratory variability, suggesting right atrial pressure of 3 mmHg. IAS/Shunts: The interatrial septum was not well visualized. Additional Comments: 3D was performed not requiring image post processing on an independent workstation and was indeterminate.  LEFT VENTRICLE PLAX 2D LVIDd:         3.20 cm Diastology LVIDs:         1.90 cm LV e' medial:    4.03 cm/s LV PW:         0.80 cm LV E/e' medial:  15.6 LV IVS:        0.70 cm LV e' lateral:   4.24 cm/s                         LV E/e' lateral: 14.9  IVC IVC diam: 1.40 cm AORTIC VALVE LVOT Vmax:   98.80 cm/s LVOT Vmean:  66.400 cm/s LVOT VTI:    0.201 m MITRAL VALVE MV Area (PHT): 2.85 cm    SHUNTS MV Decel Time: 266 msec    Systemic VTI: 0.20 m MV E velocity: 63.00 cm/s MV A velocity: 78.00 cm/s MV E/A ratio:  0.81 Jayson Sierras MD Electronically signed by Jayson Sierras MD Signature Date/Time: 11/05/2023/3:21:06 PM    Final    CT CERVICAL SPINE WO CONTRAST Result Date: 11/04/2023 EXAM: CT CERVICAL SPINE WITHOUT CONTRAST 11/04/2023 03:38:30 PM TECHNIQUE: CT of the cervical spine was performed without the administration of intravenous contrast. Multiplanar reformatted images are provided for review. Automated exposure control, iterative reconstruction, and/or weight based adjustment of the mA/kV was utilized to reduce the radiation dose to as low as reasonably achievable. COMPARISON: None available. CLINICAL HISTORY: Neck trauma (Age >= 65y). BIB EMS 2 weeks of back pain and weakness. Clemens today and could not get up due to the weakness. Redness on left cheek from fall. Did not hit head, no thinners 64-166/82-CBG 135. FINDINGS: CERVICAL SPINE: BONES AND ALIGNMENT: No acute fracture or traumatic malalignment. Overall straightening of the normal cervical lordosis is present. DEGENERATIVE CHANGES: 2 mm anterolisthesis at C3-C4 appears chronic with advanced endplate and facet changes. Chronic endplate changes and ankylosis are present at C4-C5 and C5-C6. Chronic endplate changes are present at C6-C7. Grade 1 anterolisthesis at C7-T1 is degenerative. Asymmetric posterior facet arthropathy is present on the right at C3-C4. SOFT TISSUES: No prevertebral soft tissue swelling. IMPRESSION: 1. No acute abnormality of the cervical spine related to the reported neck trauma. 2. Chronic anterolisthesis at C3-4 with advanced endplate and facet arthropathy. 3. Degenerative grade 1 anterolisthesis at C7-T1. Electronically signed by: Lonni Necessary MD 11/04/2023 03:48 PM EDT RP Workstation: [id number]   CT HEAD WO CONTRAST Result Date: 11/04/2023 EXAM: CT HEAD WITHOUT CONTRAST 11/04/2023 03:38:30 PM TECHNIQUE: CT of the head was performed without the administration of intravenous contrast. Automated exposure control, iterative reconstruction, and/or weight based adjustment of the mA/kV was utilized to reduce the radiation dose to as low as reasonably achievable. COMPARISON: None available. CLINICAL HISTORY: Minor head trauma (age >= 65 years). Patient presented via EMS with 2 weeks of back pain and weakness, fell today and could not get up. Redness on the left cheek from the fall; patient did not hit head and is not on thinners. FINDINGS: BRAIN AND VENTRICLES: Moderate atrophy and white matter changes are noted bilaterally. Atherosclerotic calcifications are present in the cavernous carotid arteries bilaterally. No hyperdense vessel is present. No acute hemorrhage. No evidence of acute infarct.  No hydrocephalus. No extra-axial collection. No mass effect or midline shift. ORBITS: No acute abnormality. SINUSES: Mild mucosal thickening is present in the right sphenoid sinus. Minimal fluid is present in the left maxillary sinus without associated fracture. SOFT TISSUES AND SKULL: Soft tissue swelling is present over the left temporal and posterior frontal scalp without underlying fracture or foreign body. Supraorbital scalp soft tissue swelling is present without underlying fracture or foreign body. No skull fracture. IMPRESSION: 1. No acute intracranial abnormality. 2. Soft tissue swelling over the left temporal, posterior frontal, and supraorbital scalp without underlying fracture or foreign body. Electronically signed by: Lonni Necessary MD 11/04/2023 03:44 PM EDT RP Workstation: [id number]   DG Chest Port 1 View Result Date: 11/04/2023 CLINICAL DATA:  Left-sided rib tenderness. Back pain and weakness for 2 weeks. Fall. EXAM: PORTABLE CHEST  1 VIEW COMPARISON:  10/24/2023 FINDINGS: Shallow inspiration. Large left diaphragmatic hernia, possibly hiatal or left hemidiaphragm. This contains stool filled colon with similar appearance to previous study. Atelectasis in the left lung base. Right lung is clear. Cardiac enlargement. Degenerative changes in the spine and shoulders. IMPRESSION: Large left diaphragmatic or hiatal hernia containing colon with similar appearance to previous study. Cardiac enlargement. Atelectasis in the left base. Electronically Signed   By: Elsie Gravely M.D.   On: 11/04/2023 15:17       Lori Liew M.D. [facility] 11/06/2023, 11:31 AM  Available via Epic secure chat 7am-7pm After 7 pm, please refer to night coverage provider listed on amion.

## 2023-11-07 ENCOUNTER — Inpatient Hospital Stay (HOSPITAL_COMMUNITY)

## 2023-11-07 DIAGNOSIS — M7989 Other specified soft tissue disorders: Secondary | ICD-10-CM

## 2023-11-07 DIAGNOSIS — R55 Syncope and collapse: Secondary | ICD-10-CM | POA: Diagnosis not present

## 2023-11-07 DIAGNOSIS — N3000 Acute cystitis without hematuria: Secondary | ICD-10-CM | POA: Diagnosis not present

## 2023-11-07 DIAGNOSIS — W19XXXA Unspecified fall, initial encounter: Secondary | ICD-10-CM | POA: Diagnosis not present

## 2023-11-07 LAB — CBC
HCT: 35.1 % — ABNORMAL LOW (ref 36.0–46.0)
Hemoglobin: 10.1 g/dL — ABNORMAL LOW (ref 12.0–15.0)
MCH: 28.9 pg (ref 26.0–34.0)
MCHC: 28.8 g/dL — ABNORMAL LOW (ref 30.0–36.0)
MCV: 100.3 fL — ABNORMAL HIGH (ref 80.0–100.0)
Platelets: 217 K/uL (ref 150–400)
RBC: 3.5 MIL/uL — ABNORMAL LOW (ref 3.87–5.11)
RDW: 14.3 % (ref 11.5–15.5)
WBC: 8.3 K/uL (ref 4.0–10.5)
nRBC: 0 % (ref 0.0–0.2)

## 2023-11-07 LAB — RENAL FUNCTION PANEL
Albumin: 3.4 g/dL — ABNORMAL LOW (ref 3.5–5.0)
Anion gap: 10 (ref 5–15)
BUN: 14 mg/dL (ref 8–23)
CO2: 26 mmol/L (ref 22–32)
Calcium: 10.2 mg/dL (ref 8.9–10.3)
Chloride: 103 mmol/L (ref 98–111)
Creatinine, Ser: 0.81 mg/dL (ref 0.44–1.00)
GFR, Estimated: >60 mL/min (ref >60)
Glucose, Bld: 80 mg/dL (ref 70–99)
Phosphorus: 2.9 mg/dL (ref 2.5–4.6)
Potassium: 4.2 mmol/L (ref 3.5–5.1)
Sodium: 138 mmol/L (ref 135–145)

## 2023-11-07 LAB — GLUCOSE, CAPILLARY
Glucose-Capillary: 85 mg/dL (ref 70–99)
Glucose-Capillary: 138 mg/dL — ABNORMAL HIGH (ref 70–99)
Glucose-Capillary: 97 mg/dL (ref 70–99)

## 2023-11-07 NOTE — Plan of Care (Signed)
  Problem: Education: Goal: Knowledge of condition and prescribed therapy will improve Outcome: Progressing   Problem: Cardiac: Goal: Will achieve and/or maintain adequate cardiac output Outcome: Progressing   Problem: Physical Regulation: Goal: Complications related to the disease process, condition or treatment will be avoided or minimized Outcome: Progressing   Problem: Education: Goal: Knowledge of General Education information will improve Description: Including pain rating scale, medication(s)/side effects and non-pharmacologic comfort measures Outcome: Progressing   Problem: Health Behavior/Discharge Planning: Goal: Ability to manage health-related needs will improve Outcome: Progressing   Problem: Clinical Measurements: Goal: Ability to maintain clinical measurements within normal limits will improve Outcome: Progressing Goal: Will remain free from infection Outcome: Progressing Goal: Diagnostic test results will improve Outcome: Progressing Goal: Respiratory complications will improve Outcome: Progressing Goal: Cardiovascular complication will be avoided Outcome: Progressing   Problem: Activity: Goal: Risk for activity intolerance will decrease Outcome: Progressing   Problem: Nutrition: Goal: Adequate nutrition will be maintained Outcome: Progressing   Problem: Coping: Goal: Level of anxiety will decrease Outcome: Progressing   Problem: Elimination: Goal: Will not experience complications related to bowel motility Outcome: Progressing Goal: Will not experience complications related to urinary retention Outcome: Progressing   Problem: Pain Managment: Goal: General experience of comfort will improve and/or be controlled Outcome: Progressing   Problem: Safety: Goal: Ability to remain free from injury will improve Outcome: Progressing   Problem: Skin Integrity: Goal: Risk for impaired skin integrity will decrease Outcome: Progressing   Problem:  Education: Goal: Knowledge of condition and prescribed therapy will improve Outcome: Progressing   Problem: Cardiac: Goal: Will achieve and/or maintain adequate cardiac output Outcome: Progressing   Problem: Physical Regulation: Goal: Complications related to the disease process, condition or treatment will be avoided or minimized Outcome: Progressing   Problem: Education: Goal: Knowledge of General Education information will improve Description: Including pain rating scale, medication(s)/side effects and non-pharmacologic comfort measures Outcome: Progressing   Problem: Health Behavior/Discharge Planning: Goal: Ability to manage health-related needs will improve Outcome: Progressing   Problem: Clinical Measurements: Goal: Ability to maintain clinical measurements within normal limits will improve Outcome: Progressing Goal: Will remain free from infection Outcome: Progressing Goal: Diagnostic test results will improve Outcome: Progressing Goal: Respiratory complications will improve Outcome: Progressing Goal: Cardiovascular complication will be avoided Outcome: Progressing   Problem: Activity: Goal: Risk for activity intolerance will decrease Outcome: Progressing   Problem: Nutrition: Goal: Adequate nutrition will be maintained Outcome: Progressing   Problem: Coping: Goal: Level of anxiety will decrease Outcome: Progressing   Problem: Elimination: Goal: Will not experience complications related to bowel motility Outcome: Progressing Goal: Will not experience complications related to urinary retention Outcome: Progressing   Problem: Pain Managment: Goal: General experience of comfort will improve and/or be controlled Outcome: Progressing   Problem: Safety: Goal: Ability to remain free from injury will improve Outcome: Progressing   Problem: Skin Integrity: Goal: Risk for impaired skin integrity will decrease Outcome: Progressing

## 2023-11-07 NOTE — TOC Initial Note (Signed)
 Transition of Care [facility]) - Initial/Assessment Note    Patient Details  Name: Anna Adams MRN: [id number] Date of Birth: October 02, 1934  [facility]) CM/SW Contact:    Sheri ONEIDA Sharps, LCSW Phone Number: 11/07/2023, 12:31 PM  Clinical Narrative:                 Pt from home alone. Pt recommended for SNF. Pt and son accepting of rec. PASRR obtained and FL2 completed. SNF ref faxed; awaiting bed offers.   Expected Discharge Plan: Skilled Nursing Facility Barriers to Discharge: Continued Medical Work up   Patient Goals and CMS Choice Patient states their goals for this hospitalization and ongoing recovery are:: return home   Choice offered to / list presented to : Adult Children [place] ownership interest in [facility].provided to:: Adult Children    Expected Discharge Plan and Services In-house Referral: NA Discharge Planning Services: NA Post Acute Care Choice: Skilled Nursing Facility Living arrangements for the past 2 months: Single Family Home                 DME Arranged: N/A DME Agency: NA       HH Arranged: NA HH Agency: NA        Prior Living Arrangements/Services Living arrangements for the past 2 months: Single Family Home Lives with:: Self Patient language and need for interpreter reviewed:: Yes Do you feel safe going back to the place where you live?: Yes      Need for Family Participation in Patient Care: Yes (Comment) Care giver support system in place?: Yes (comment)   Criminal Activity/Legal Involvement Pertinent to Current Situation/Hospitalization: No - Comment as needed  Activities of Daily Living   ADL Screening (condition at time of admission) Independently performs ADLs?: Yes (appropriate for developmental age) Is the patient deaf or have difficulty hearing?: No Does the patient have difficulty seeing, even when wearing glasses/contacts?: No Does the patient have difficulty concentrating, remembering, or making  decisions?: No  Permission Sought/Granted                  Emotional Assessment Appearance:: Appears stated age Attitude/Demeanor/Rapport: Engaged Affect (typically observed): Accepting Orientation: : Oriented to Self, Oriented to Place Alcohol / Substance Use: Not Applicable Psych Involvement: No (comment)  Admission diagnosis:  Syncope and collapse [R55] Fall, initial encounter [W19.XXXA] Syncope, unspecified syncope type [R55] Patient Active Problem List   Diagnosis Date Noted   UTI (urinary tract infection) 11/06/2023   Orthostatic hypotension 11/04/2023   Syncope and collapse 11/04/2023   Asymmetric septal hypertrophy 11/22/2022   Stage 3b chronic kidney disease (HCC) 11/21/2022   Lumbar spinal stenosis with chronic pain 05/19/2021   Fall at home, initial encounter 05/20/2020   Disorder of right trigeminal nerve 12/31/2019   Presbycusis of both ears 12/24/2019   Poor balance 10/23/2019   Trigeminal neuralgia 04/20/2019   Right carpal tunnel syndrome 04/12/2018   Hyperparathyroidism (HCC) - Dr Tommas 04/10/2018   History of kidney stones 04/10/2018   Diverticulitis 10/12/2017   Prediabetes 10/11/2017   Squamous cell skin cancer 04/01/2016   Hiatal hernia, large 09/22/2015   Pain in lower limb 03/04/2015   Edema 11/26/2013   Parotid tumor 03/23/2013   Dyslipidemia    Onychomycosis due to dermatophyte 05/05/2012   Osteoporosis, post-menopausal - Dr Tommas    GERD (gastroesophageal reflux disease)    Osteoarthritis 01/08/2011   Hypothyroidism - Dr Tommas 02/07/2009   Essential hypertension 02/07/2009   ALLERGIC RHINITIS  02/07/2009   Intrinsic asthma 02/07/2009   PCP:  Geofm Glade PARAS, MD Pharmacy:   [facility] - [address] [place] - [address] [place] [address] Phone: [phone] Fax: [phone]  [id number] - [place], [place] - [address] [place] [address] Phone: [phone] Fax:  [phone]     Social Drivers of Health (SDOH) Social History: SDOH Screenings   Food Insecurity: No Food Insecurity (11/05/2023)  Housing: Low Risk  (11/07/2023)  Transportation Needs: No Transportation Needs (11/07/2023)  Utilities: Not At Risk (11/07/2023)  Alcohol Screen: Low Risk  (05/25/2023)  Depression (PHQ2-9): Medium Risk (05/03/2023)  Financial Resource Strain: Low Risk  (05/25/2023)  Physical Activity: Inactive (05/25/2023)  Social Connections: Socially Isolated (11/07/2023)  Stress: No Stress Concern Present (05/25/2023)  Tobacco Use: Medium Risk (11/04/2023)  Health Literacy: Adequate Health Literacy (05/25/2023)   SDOH Interventions:     Readmission Risk Interventions    11/07/2023   12:30 PM  Readmission Risk Prevention Plan  Post Dischage Appt Complete  Medication Screening Complete  Transportation Screening Complete

## 2023-11-07 NOTE — [place] FL2 (Signed)
 [place]  MEDICAID FL2 LEVEL OF CARE FORM     IDENTIFICATION  Patient Name: Anna Adams Birthdate: Aug 19, 1934 Sex: female Admission Date (Current Location): 11/04/2023  [facility] and [place] Number:  Producer, television/film/video and Address:  [facility],  [address], [place] [address]      Provider Number: [id number]  Attending Physician Name and Address:  Davia Nydia POUR, MD  Relative Name and Phone Number:  Biehler,robert mark (Son)  [phone]    Current Level of Care: Hospital Recommended Level of Care: Skilled Nursing Facility Prior Approval Number:    Date Approved/Denied:   PASRR Number: [id number]  Discharge Plan: SNF    Current Diagnoses: Patient Active Problem List   Diagnosis Date Noted   UTI (urinary tract infection) 11/06/2023   Orthostatic hypotension 11/04/2023   Syncope and collapse 11/04/2023   Asymmetric septal hypertrophy 11/22/2022   Stage 3b chronic kidney disease (HCC) 11/21/2022   Lumbar spinal stenosis with chronic pain 05/19/2021   Fall at home, initial encounter 05/20/2020   Disorder of right trigeminal nerve 12/31/2019   Presbycusis of both ears 12/24/2019   Poor balance 10/23/2019   Trigeminal neuralgia 04/20/2019   Right carpal tunnel syndrome 04/12/2018   Hyperparathyroidism (HCC) - Dr Tommas 04/10/2018   History of kidney stones 04/10/2018   Diverticulitis 10/12/2017   Prediabetes 10/11/2017   Squamous cell skin cancer 04/01/2016   Hiatal hernia, large 09/22/2015   Pain in lower limb 03/04/2015   Edema 11/26/2013   Parotid tumor 03/23/2013   Dyslipidemia    Onychomycosis due to dermatophyte 05/05/2012   Osteoporosis, post-menopausal - Dr Tommas    GERD (gastroesophageal reflux disease)    Osteoarthritis 01/08/2011   Hypothyroidism - Dr Tommas 02/07/2009   Essential hypertension 02/07/2009   ALLERGIC RHINITIS 02/07/2009   Intrinsic asthma 02/07/2009    Orientation RESPIRATION BLADDER Height & Weight     Self,  Place  Normal Incontinent Weight: 149 lb 11.1 oz (67.9 kg) Height:  4' 11 (149.9 cm)  BEHAVIORAL SYMPTOMS/MOOD NEUROLOGICAL BOWEL NUTRITION STATUS      Incontinent Diet (see dc summary)  AMBULATORY STATUS COMMUNICATION OF NEEDS Skin   Limited Assist   Normal                       Personal Care Assistance Level of Assistance  Bathing, Dressing, Feeding Bathing Assistance: Limited assistance Feeding assistance: Limited assistance Dressing Assistance: Limited assistance     Functional Limitations Info  Speech, Hearing, Sight Sight Info: Impaired Hearing Info: Impaired Speech Info: Adequate    SPECIAL CARE FACTORS FREQUENCY  PT (By licensed PT), OT (By licensed OT)     PT Frequency: 5x/wk OT Frequency: 5x/wk            Contractures Contractures Info: Not present    Additional Factors Info  Code Status, Allergies Code Status Info: Full code Allergies Info: Contrast Media (Iodinated Contrast Media)  Dyphylline-guaifenesin  Erythromycin  Gabapentin   Mevacor (Lovastatin)  Other           Current Medications (11/07/2023):  This is the current hospital active medication list Current Facility-Administered Medications  Medication Dose Route Frequency Provider Last Rate Last Admin   acetaminophen (TYLENOL) tablet 650 mg  650 mg Oral Q6H PRN Jackee Zebedee BROCKS, MD       Or   acetaminophen (TYLENOL) suppository 650 mg  650 mg Rectal Q6H PRN Sheehan, Theresa C, MD       alum &  mag hydroxide-simeth (MAALOX/MYLANTA) 200-200-20 MG/5ML suspension 30 mL  30 mL Oral Q6H PRN Sheehan, Theresa C, MD       atorvastatin  (LIPITOR) tablet 20 mg  20 mg Oral QHS Rai, Ripudeep K, MD   20 mg at 11/06/23 2141   B-complex with vitamin C tablet 1 tablet  1 tablet Oral Daily Rai, Ripudeep K, MD   1 tablet at 11/07/23 1045   ceFEPIme (MAXIPIME) 1 g in sodium chloride 0.9 % 100 mL IVPB  1 g Intravenous Q24H Danique, H[place]ough, RPH 200 mL/hr at 11/06/23 1441 1 g at 11/06/23 1441   cholecalciferol  (VITAMIN D3) 10 MCG (400 UNIT) tablet 400 Units  400 Units Oral Daily Rai, Ripudeep K, MD   400 Units at 11/07/23 1045   enoxaparin (LOVENOX) injection 40 mg  40 mg Subcutaneous Q24H Jackee Zebedee BROCKS, MD   40 mg at 11/06/23 2140   famotidine  (PEPCID ) tablet 40 mg  40 mg Oral QHS Rai, Ripudeep K, MD   40 mg at 11/06/23 2140   feeding supplement (ENSURE PLUS HIGH PROTEIN) liquid 237 mL  237 mL Oral BID BM Rai, Ripudeep K, MD   237 mL at 11/07/23 1049   folic acid (FOLVITE) tablet 1 mg  1 mg Oral Daily Rai, Ripudeep K, MD   1 mg at 11/07/23 1045   hydrALAZINE (APRESOLINE) injection 10 mg  10 mg Intravenous Q6H PRN Rai, Ripudeep K, MD       ibuprofen (ADVIL) tablet 600 mg  600 mg Oral Q6H PRN Sheehan, Theresa C, MD       irbesartan (AVAPRO) tablet 75 mg  75 mg Oral Daily Sheehan, Theresa C, MD   75 mg at 11/07/23 1045   latanoprost (XALATAN) 0.005 % ophthalmic solution 1 drop  1 drop Both Eyes QHS Jackee Zebedee BROCKS, MD   1 drop at 11/06/23 2141   levothyroxine  (SYNTHROID ) tablet 125 mcg  125 mcg Oral Q0600 Sheehan, Theresa C, MD   125 mcg at 11/07/23 0643   ondansetron (ZOFRAN) tablet 4 mg  4 mg Oral Q6H PRN Jackee Zebedee BROCKS, MD       Or   ondansetron (ZOFRAN) injection 4 mg  4 mg Intravenous Q6H PRN Sheehan, Theresa C, MD       sodium chloride flush (NS) 0.9 % injection 3 mL  3 mL Intravenous Q12H Jackee Zebedee BROCKS, MD   3 mL at 11/07/23 1046   sodium chloride flush (NS) 0.9 % injection 3 mL  3 mL Intravenous Q12H Jackee Zebedee BROCKS, MD   3 mL at 11/07/23 1045   sodium chloride flush (NS) 0.9 % injection 3 mL  3 mL Intravenous PRN Jackee Zebedee BROCKS, MD       traMADol  (ULTRAM ) tablet 100 mg  100 mg Oral Q6H PRN Sheehan, Theresa C, MD         Discharge Medications: Please see discharge summary for a list of discharge medications.  Relevant Imaging Results:  Relevant Lab Results:   Additional Information SSN [id number]  Sheri ONEIDA Sharps, LCSW

## 2023-11-07 NOTE — Progress Notes (Signed)
 Mobility Specialist Progress Note:    11/07/23 1528  Mobility  Activity Dangled on edge of bed  Level of Assistance Moderate assist, patient does 50-74%  Assistive Device None  Activity Response Tolerated poorly  Mobility Referral Yes  Mobility visit 1 Mobility  Mobility Specialist Start Time (ACUTE ONLY) 1340  Mobility Specialist Stop Time (ACUTE ONLY) 1359  Mobility Specialist Time Calculation (min) (ACUTE ONLY) 19 min   Pt was received in bed and agreed to mobility. Mod A to edge of bed, expressed dizziness. BP was 114/63 mmHg. Repositioned bed into chair position to alleviate dizziness. Left in chair position with all needs met. Call bell in reach.  [employer] - Mobility Specialist

## 2023-11-07 NOTE — Progress Notes (Signed)
 VASCULAR LAB    Left upper extremity venous duplex has been performed.  See CV proc for preliminary results.   Deeann Servidio, RVT 11/07/2023, 4:11 PM

## 2023-11-07 NOTE — Plan of Care (Signed)
  Problem: Clinical Measurements: Goal: Diagnostic test results will improve Outcome: Progressing Goal: Cardiovascular complication will be avoided Outcome: Progressing   Problem: Activity: Goal: Risk for activity intolerance will decrease Outcome: Progressing   

## 2023-11-07 NOTE — Progress Notes (Signed)
 [facility]                                                                              Anna Adams, is a 89 y.o. female, DOB - 08/19/1934, FMW:[id number] Admit date - 11/04/2023    Outpatient Primary MD for the patient is Geofm, Glade PARAS, MD  LOS - 3  days  Chief Complaint  Patient presents with   Fall       Brief summary   Patient is a 89 year old female with CKD, HTN, weakness, adult FTT presented to ED after found down by her caretaker.  Patient was lying on her left side.  She was last seen normal night before and had dinner.  She was on the ground for unknown amount of time.  In the ED, patient could not recall if she tripped and fell or passed out. Son reported that on 9/29 he had brought her to the ED because she was dizzy and could not sit up in the bed.  Patient was given some IV fluids, treated for UTI and doing okay after that until the day of admission.  He also could not get patient does not drink very much, lives alone and has people check on her a couple of times a week. In ED patient was symptomatic just sitting up in the bed, felt dizzy and weak, admitted for further workup  Assessment & Plan       Orthostatic hypotension, dizziness - Continue to hold furosemide , received IVF  - Anemia panel within normal limits, B12 465, TSH 1.8 - A.m. cortisol level normal - Per patient, now she remembers that she slipped on something and she fell, may have hit her head.  CT head and C-spine showed no acute intracranial abnormality, soft tissue swelling over the left temporal posterior frontal and supraorbital scalp with no fracture. -2D echo showed EF 70 to 75%, no acute valve stenosis - PT OT evaluation recommended HPT versus SNF. - d/w the son, she takes lasix  for feet swelling, no longer on amlodipine . Was started on lasix  a year ago. Recommended to use lasix  20mg  as needed for swelling, not 40mg  daily.   Pseudomonas urinary tract infection - I was contacted  by patient's PCP Dr. Geofm on 10/12 that outpatient urine culture PTA 10/8 showed Pseudomonas.  Unclear if this was done for symptomatic UTI.  Patient had complained of generalized weakness. - Currently on IV cefepime    Fall at home, initial encounter -PT OT evaluation recommended SNF   Hypertension - Continue irbesartan, hold furosemide  - hydralazine IV as needed with parameters  Hyperlipidemia - Continue statin  Generalized debility, FTT - PT OT evaluation recommended HHPT vs SNF  -she has been living alone independently with people checking on her couple of times a week, does not have 24/7 supervision. She has been having recent falls, generalized weakness and will benefit from short term rehab. Meanwhile, family will try to arrange more help at home vs looking into ALF as more permanent solution for supervised setting.  - TOC consult  GERD - Continue famotidine   CKD stage IIIb - Currently creatinine at baseline  Estimated body mass index is 30.23 kg/m as calculated from the following:   Height as of this encounter: 4' 11 (1.499 m).   Weight as of this encounter: 67.9 kg.  Code Status: Full code DVT Prophylaxis:  enoxaparin (LOVENOX) injection 40 mg Start: 11/04/23 2200   Level of Care: Level of care: Telemetry Family Communication: Updated patient's daughter yesterday Disposition Plan:      Remains inpatient appropriate:   Pending SNF when bed available   Procedures:    Consultants:     Antimicrobials:   Anti-infectives (From admission, onward)    Start     Dose/Rate Route Frequency Ordered Stop   11/06/23 1300  ceFEPIme (MAXIPIME) 1 g in sodium chloride 0.9 % 100 mL IVPB        1 g 200 mL/hr over 30 Minutes Intravenous Every 24 hours 11/06/23 1149            Medications  atorvastatin   20 mg Oral QHS   B-complex with vitamin C  1 tablet Oral Daily   cholecalciferol  400 Units Oral Daily   enoxaparin (LOVENOX) injection  40 mg Subcutaneous Q24H    famotidine   40 mg Oral QHS   feeding supplement  237 mL Oral BID BM   folic acid  1 mg Oral Daily   irbesartan  75 mg Oral Daily   latanoprost  1 drop Both Eyes QHS   levothyroxine   125 mcg Oral Q0600   sodium chloride flush  3 mL Intravenous Q12H   sodium chloride flush  3 mL Intravenous Q12H      Subjective:   Anna Adams was seen and examined today.  No acute complaints per the patient.  Overnight no acute issues, awaiting SNF.  Feeling better.  Objective:   Vitals:   11/06/23 0453 11/06/23 2114 11/07/23 0515 11/07/23 1303  BP:  137/66 117/65 (!) 110/53  Pulse:  61 80 61  Resp:  20 20 18   Temp:  98 F (36.7 C) 98.6 F (37 C) 98.9 F (37.2 C)  TempSrc:    Oral  SpO2:  98% 97% 98%  Weight: 66.4 kg  67.9 kg   Height:       No intake or output data in the 24 hours ending 11/07/23 1413    Wt Readings from Last 3 Encounters:  11/07/23 67.9 kg  11/02/23 63 kg  05/25/23 63 kg    Physical Exam General: Alert and oriented x 3, NAD Cardiovascular: S1 S2 clear, RRR.  Respiratory: CTAB Gastrointestinal: Soft, nontender, nondistended, NBS Ext: no pedal edema bilaterally Neuro: no new deficits Psych: Normal affect   Data Reviewed:  I have personally reviewed following labs    CBC Lab Results  Component Value Date   WBC 8.3 11/07/2023   RBC 3.50 (L) 11/07/2023   HGB 10.1 (L) 11/07/2023   HCT 35.1 (L) 11/07/2023   MCV 100.3 (H) 11/07/2023   MCH 28.9 11/07/2023   PLT 217 11/07/2023   MCHC 28.8 (L) 11/07/2023   RDW 14.3 11/07/2023   LYMPHSABS 0.8 11/04/2023   MONOABS 0.7 11/04/2023   EOSABS 0.0 11/04/2023   BASOSABS 0.1 11/04/2023     Last metabolic panel Lab Results  Component Value Date   NA 138 11/07/2023   K 4.2 11/07/2023   CL 103 11/07/2023   CO2 26 11/07/2023   BUN 14 11/07/2023   CREATININE 0.81 11/07/2023   GLUCOSE 80 11/07/2023   GFRNONAA >60 11/07/2023   GFRAA 88 12/31/2019   CALCIUM   10.2 11/07/2023   PHOS 2.9 11/07/2023   PROT 6.2  (L) 11/04/2023   ALBUMIN 3.4 (L) 11/07/2023   BILITOT 0.6 11/04/2023   ALKPHOS 97 11/04/2023   AST 72 (H) 11/04/2023   ALT 41 11/04/2023   ANIONGAP 10 11/07/2023    CBG (last 3)  Recent Labs    11/05/23 2048 11/06/23 2119 11/07/23 0513  GLUCAP 94 74 85      Coagulation Profile: No results for input(s): INR, PROTIME in the last 168 hours.   Radiology Studies: I have personally reviewed the imaging studies  ECHOCARDIOGRAM COMPLETE Result Date: 11/05/2023    ECHOCARDIOGRAM REPORT   Patient Name:   Anna Adams Date of Exam: 11/05/2023 Medical Rec #:  [id number]    Height:       59.0 in Accession #:    [id number]   Weight:       139.0 lb Date of Birth:  [birth year]    BSA:          1.580 m Patient Age:    89 years     BP:           134/84 mmHg Patient Gender: F            HR:           72 bpm. Exam Location:  Inpatient Procedure: 2D Echo, Cardiac Doppler and Color Doppler (Both Spectral and Color            Flow Doppler were utilized during procedure). Indications:    Syncope R55  History:        Patient has prior history of Echocardiogram examinations, most                 recent 06/01/2021.  Sonographer:    Tinnie Gosling RDCS Referring Phys: [phone] THERESA C SHEEHAN  Sonographer Comments: Technically difficult study due to poor echo windows. IMPRESSIONS  1. Left ventricular ejection fraction, by estimation, is 70 to 75%. The left ventricle has hyperdynamic function. Left ventricular endocardial border not optimally defined to evaluate regional wall motion. Left ventricular diastolic parameters are indeterminate. Elevated left atrial pressure.  2. Right ventricular systolic function was not well visualized. The right ventricular size is not well visualized. Tricuspid regurgitation signal is inadequate for assessing PA pressure.  3. The mitral valve is degenerative. Trivial mitral valve regurgitation.  4. The aortic valve was not well visualized. Aortic valve regurgitation is not visualized.   5. The inferior vena cava is normal in size with greater than 50% respiratory variability, suggesting right atrial pressure of 3 mmHg. Comparison(s): Extremely limited images. LVEF approximately 70-75%. FINDINGS  Left Ventricle: Left ventricular ejection fraction, by estimation, is 70 to 75%. The left ventricle has hyperdynamic function. Left ventricular endocardial border not optimally defined to evaluate regional wall motion. The left ventricular internal cavity size was normal in size. There is no left ventricular hypertrophy. Left ventricular diastolic parameters are indeterminate. Elevated left atrial pressure. Right Ventricle: The right ventricular size is not well visualized. Right vetricular wall thickness was not well visualized. Right ventricular systolic function was not well visualized. Tricuspid regurgitation signal is inadequate for assessing PA pressure. Left Atrium: Left atrial size was normal in size. Right Atrium: Right atrial size was normal in size. Pericardium: There is no evidence of pericardial effusion. Presence of epicardial fat layer. Mitral Valve: The mitral valve is degenerative in appearance. Trivial mitral valve regurgitation. Tricuspid Valve: The tricuspid valve is not well visualized. Tricuspid valve regurgitation  is trivial. Aortic Valve: The aortic valve was not well visualized. Aortic valve regurgitation is not visualized. Pulmonic Valve: The pulmonic valve was not well visualized. Pulmonic valve regurgitation is not visualized. Aorta: The aortic root was not well visualized. Venous: The inferior vena cava is normal in size with greater than 50% respiratory variability, suggesting right atrial pressure of 3 mmHg. IAS/Shunts: The interatrial septum was not well visualized. Additional Comments: 3D was performed not requiring image post processing on an independent workstation and was indeterminate.  LEFT VENTRICLE PLAX 2D LVIDd:         3.20 cm Diastology LVIDs:         1.90 cm LV e'  medial:    4.03 cm/s LV PW:         0.80 cm LV E/e' medial:  15.6 LV IVS:        0.70 cm LV e' lateral:   4.24 cm/s                        LV E/e' lateral: 14.9  IVC IVC diam: 1.40 cm AORTIC VALVE LVOT Vmax:   98.80 cm/s LVOT Vmean:  66.400 cm/s LVOT VTI:    0.201 m MITRAL VALVE MV Area (PHT): 2.85 cm    SHUNTS MV Decel Time: 266 msec    Systemic VTI: 0.20 m MV E velocity: 63.00 cm/s MV A velocity: 78.00 cm/s MV E/A ratio:  0.81 Jayson Sierras MD Electronically signed by Jayson Sierras MD Signature Date/Time: 11/05/2023/3:21:06 PM    Final        Nydia Distance M.D. [facility] 11/07/2023, 2:13 PM  Available via Epic secure chat 7am-7pm After 7 pm, please refer to night coverage provider listed on amion.

## 2023-11-08 ENCOUNTER — Inpatient Hospital Stay (HOSPITAL_COMMUNITY)

## 2023-11-08 DIAGNOSIS — I6782 Cerebral ischemia: Secondary | ICD-10-CM | POA: Diagnosis not present

## 2023-11-08 DIAGNOSIS — J9601 Acute respiratory failure with hypoxia: Secondary | ICD-10-CM

## 2023-11-08 DIAGNOSIS — J9602 Acute respiratory failure with hypercapnia: Secondary | ICD-10-CM

## 2023-11-08 DIAGNOSIS — K449 Diaphragmatic hernia without obstruction or gangrene: Secondary | ICD-10-CM | POA: Diagnosis not present

## 2023-11-08 DIAGNOSIS — R0989 Other specified symptoms and signs involving the circulatory and respiratory systems: Secondary | ICD-10-CM | POA: Diagnosis not present

## 2023-11-08 DIAGNOSIS — G9341 Metabolic encephalopathy: Secondary | ICD-10-CM | POA: Diagnosis not present

## 2023-11-08 DIAGNOSIS — R918 Other nonspecific abnormal finding of lung field: Secondary | ICD-10-CM | POA: Diagnosis not present

## 2023-11-08 DIAGNOSIS — N3000 Acute cystitis without hematuria: Secondary | ICD-10-CM | POA: Diagnosis not present

## 2023-11-08 DIAGNOSIS — R4182 Altered mental status, unspecified: Secondary | ICD-10-CM | POA: Diagnosis not present

## 2023-11-08 DIAGNOSIS — W19XXXA Unspecified fall, initial encounter: Secondary | ICD-10-CM | POA: Diagnosis not present

## 2023-11-08 DIAGNOSIS — R0600 Dyspnea, unspecified: Secondary | ICD-10-CM | POA: Diagnosis not present

## 2023-11-08 LAB — CBC
HCT: 38 % (ref 36.0–46.0)
HCT: 32.6 % — ABNORMAL LOW (ref 36.0–46.0)
Hemoglobin: 10.4 g/dL — ABNORMAL LOW (ref 12.0–15.0)
Hemoglobin: 9.1 g/dL — ABNORMAL LOW (ref 12.0–15.0)
MCH: 28 pg (ref 26.0–34.0)
MCH: 28.3 pg (ref 26.0–34.0)
MCHC: 27.4 g/dL — ABNORMAL LOW (ref 30.0–36.0)
MCHC: 27.9 g/dL — ABNORMAL LOW (ref 30.0–36.0)
MCV: 102.2 fL — ABNORMAL HIGH (ref 80.0–100.0)
MCV: 101.2 fL — ABNORMAL HIGH (ref 80.0–100.0)
Platelets: 263 K/uL (ref 150–400)
Platelets: 224 K/uL (ref 150–400)
RBC: 3.72 MIL/uL — ABNORMAL LOW (ref 3.87–5.11)
RBC: 3.22 MIL/uL — ABNORMAL LOW (ref 3.87–5.11)
RDW: 14.2 % (ref 11.5–15.5)
RDW: 14.2 % (ref 11.5–15.5)
WBC: 8.7 K/uL (ref 4.0–10.5)
WBC: 7.6 K/uL (ref 4.0–10.5)
nRBC: 0 % (ref 0.0–0.2)
nRBC: 0 % (ref 0.0–0.2)

## 2023-11-08 LAB — BLOOD GAS, ARTERIAL
Acid-Base Excess: 2.6 mmol/L — ABNORMAL HIGH (ref 0.0–2.0)
Acid-Base Excess: 2.9 mmol/L — ABNORMAL HIGH (ref 0.0–2.0)
Bicarbonate: 33.2 mmol/L — ABNORMAL HIGH (ref 20.0–28.0)
Bicarbonate: 32.5 mmol/L — ABNORMAL HIGH (ref 20.0–28.0)
Delivery systems: POSITIVE
Drawn by: 213381
Drawn by: 31394
Expiratory PAP: 5 cmH2O
FIO2: 40 %
Inspiratory PAP: 24 cmH2O
O2 Content: 4 L/min
O2 Saturation: 95.3 %
O2 Saturation: 100 %
Patient temperature: 37
Patient temperature: 37.2
RATE: 20 {breaths}/min
pCO2 arterial: 83 mmHg (ref 32–48)
pCO2 arterial: 75 mmHg (ref 32–48)
pH, Arterial: 7.21 — ABNORMAL LOW (ref 7.35–7.45)
pH, Arterial: 7.25 — ABNORMAL LOW (ref 7.35–7.45)
pO2, Arterial: 67 mmHg — ABNORMAL LOW (ref 83–108)
pO2, Arterial: 101 mmHg (ref 83–108)

## 2023-11-08 LAB — URINE CULTURE: Culture: 10000 — AB

## 2023-11-08 LAB — RENAL FUNCTION PANEL
Albumin: 3.7 g/dL (ref 3.5–5.0)
Anion gap: 6 (ref 5–15)
BUN: 15 mg/dL (ref 8–23)
CO2: 31 mmol/L (ref 22–32)
Calcium: 10.7 mg/dL — ABNORMAL HIGH (ref 8.9–10.3)
Chloride: 102 mmol/L (ref 98–111)
Creatinine, Ser: 1.01 mg/dL — ABNORMAL HIGH (ref 0.44–1.00)
GFR, Estimated: 54 mL/min — ABNORMAL LOW (ref >60)
Glucose, Bld: 110 mg/dL — ABNORMAL HIGH (ref 70–99)
Phosphorus: 3.7 mg/dL (ref 2.5–4.6)
Potassium: 4.8 mmol/L (ref 3.5–5.1)
Sodium: 139 mmol/L (ref 135–145)

## 2023-11-08 LAB — LACTIC ACID, PLASMA: Lactic Acid, Venous: 0.7 mmol/L (ref 0.5–1.9)

## 2023-11-08 LAB — MRSA NEXT GEN BY PCR, NASAL: MRSA by PCR Next Gen: NOT DETECTED

## 2023-11-08 LAB — GLUCOSE, CAPILLARY
Glucose-Capillary: 114 mg/dL — ABNORMAL HIGH (ref 70–99)
Glucose-Capillary: 134 mg/dL — ABNORMAL HIGH (ref 70–99)

## 2023-11-08 LAB — VITAMIN B1: Vitamin B1 (Thiamine): 82.1 nmol/L (ref 66.5–200.0)

## 2023-11-08 LAB — MAGNESIUM: Magnesium: 2.5 mg/dL — ABNORMAL HIGH (ref 1.7–2.4)

## 2023-11-08 LAB — BASIC METABOLIC PANEL WITH GFR
Anion gap: 6 (ref 5–15)
BUN: 17 mg/dL (ref 8–23)
CO2: 30 mmol/L (ref 22–32)
Calcium: 10.5 mg/dL — ABNORMAL HIGH (ref 8.9–10.3)
Chloride: 104 mmol/L (ref 98–111)
Creatinine, Ser: 1.31 mg/dL — ABNORMAL HIGH (ref 0.44–1.00)
GFR, Estimated: 39 mL/min — ABNORMAL LOW (ref >60)
Glucose, Bld: 106 mg/dL — ABNORMAL HIGH (ref 70–99)
Potassium: 4.7 mmol/L (ref 3.5–5.1)
Sodium: 141 mmol/L (ref 135–145)

## 2023-11-08 LAB — PRO BRAIN NATRIURETIC PEPTIDE: Pro Brain Natriuretic Peptide: 485 pg/mL — ABNORMAL HIGH (ref <300.0)

## 2023-11-08 LAB — PROCALCITONIN: Procalcitonin: 0.11 ng/mL

## 2023-11-08 LAB — AMMONIA: Ammonia: 33 umol/L (ref 9–35)

## 2023-11-08 LAB — TROPONIN T, HIGH SENSITIVITY: Troponin T High Sensitivity: 30 ng/L — ABNORMAL HIGH (ref 0–19)

## 2023-11-08 MED ORDER — FUROSEMIDE 10 MG/ML IJ SOLN: 40.0000 mg | Freq: Once | INTRAMUSCULAR | Status: DC

## 2023-11-08 MED ORDER — CHLORHEXIDINE GLUCONATE CLOTH 2 % EX PADS
6.0000 | MEDICATED_PAD | Freq: Every day | CUTANEOUS | Status: DC
  Administered 2023-11-08 – 2023-11-11 (×4): 6 via TOPICAL

## 2023-11-08 MED ORDER — FAMOTIDINE 20 MG PO TABS
20.0000 mg | ORAL_TABLET | Freq: Every day | ORAL | Status: DC
  Administered 2023-11-09 – 2023-11-12 (×4): 20 mg via ORAL
  Filled 2023-11-08 (×5): qty 1

## 2023-11-08 MED ORDER — SODIUM CHLORIDE 0.9 % IV SOLN
250.0000 mL | INTRAVENOUS | Status: AC
Start: 2023-11-08 — End: 2023-11-09

## 2023-11-08 MED ORDER — LACTATED RINGERS IV BOLUS
500.0000 mL | Freq: Once | INTRAVENOUS | Status: AC
  Administered 2023-11-08: 500 mL via INTRAVENOUS

## 2023-11-08 MED ORDER — NALOXONE HCL 0.4 MG/ML IJ SOLN
0.4000 mg | Freq: Once | INTRAMUSCULAR | Status: AC
  Filled 2023-11-08: qty 1

## 2023-11-08 MED ORDER — ENOXAPARIN SODIUM 30 MG/0.3ML IJ SOSY
30.0000 mg | PREFILLED_SYRINGE | INTRAMUSCULAR | Status: DC
  Administered 2023-11-08 – 2023-11-09 (×2): 30 mg via SUBCUTANEOUS
  Filled 2023-11-08 (×2): qty 0.3

## 2023-11-08 MED ORDER — NALOXONE HCL 0.4 MG/ML IJ SOLN
INTRAMUSCULAR | Status: AC
  Administered 2023-11-08: 0.4 mg
  Filled 2023-11-08: qty 1

## 2023-11-08 MED ORDER — ACETAMINOPHEN 10 MG/ML IV SOLN
1000.0000 mg | Freq: Once | INTRAVENOUS | Status: AC
  Administered 2023-11-08: 1000 mg via INTRAVENOUS
  Filled 2023-11-08: qty 100

## 2023-11-08 MED ORDER — NOREPINEPHRINE 4 MG/250ML-% IV SOLN
INTRAVENOUS | Status: AC
  Filled 2023-11-08: qty 250

## 2023-11-08 MED ORDER — NOREPINEPHRINE 4 MG/250ML-% IV SOLN: 0.0000 ug/min | INTRAVENOUS | Status: DC

## 2023-11-08 MED ORDER — SODIUM CHLORIDE 0.9 % IV SOLN
1.0000 g | Freq: Two times a day (BID) | INTRAVENOUS | Status: DC
  Administered 2023-11-08 (×2): 1 g via INTRAVENOUS
  Filled 2023-11-08 (×3): qty 20

## 2023-11-08 MED ORDER — NALOXONE HCL 0.4 MG/ML IJ SOLN
0.4000 mg | Freq: Once | INTRAMUSCULAR | Status: AC
  Administered 2023-11-08: 0.4 mg via INTRAVENOUS

## 2023-11-08 NOTE — Significant Event (Signed)
 Rapid Response Event Note   Reason for Call :  Altered LOC   Initial Focused Assessment:  Patient has been more lethargic today vs. Yesterday. Right sided facial droop noted. All four extremities are weak but symmetrical. Patient A/O X 0. Responds to voice but quickly falls asleep. Hemodynamically stable, NSR on monitor. MD Rai placed orders for ABG, head CT, Ammonia. Narcan given X 2 per MD Rai with no response.      Interventions:  Head CT ABG Ammonia  Plan of Care:  Monitor patient, if mentation worsens reach back out to ABG, will be on the look out for test/ lab results.   MD Notified: Nydia Distance MD Call Time: 0900  Arrival Time: 9084 End Time: 9059  Omega LILLETTE Settles, RN

## 2023-11-08 NOTE — Progress Notes (Signed)
   11/08/23 1951  BiPAP/CPAP/SIPAP  BiPAP/CPAP/SIPAP Pt Type Adult  BiPAP/CPAP/SIPAP V60  Mask Type Full face mask  Dentures removed? Not applicable  Mask Size Small  Set Rate 16 breaths/min  Respiratory Rate 21 breaths/min  IPAP 28 cmH20  EPAP 5 cmH2O  FiO2 (%) 40 %  Minute Ventilation 12  Leak 0  Peak Inspiratory Pressure (PIP) 28  Tidal Volume (Vt) 470  Patient Home Machine No  Patient Home Mask No  Patient Home Tubing No  Auto Titrate No  Press High Alarm 33 cmH2O  Press Low Alarm 5 cmH2O  Device Plugged into RED Power Outlet Yes  BiPAP/CPAP /SiPAP Vitals  Bilateral Breath Sounds Other (Comment) (coarse)

## 2023-11-08 NOTE — Progress Notes (Signed)
 ABG drawn and sent to lab per MD order. PT tolerated well.  RN and family at bedside.

## 2023-11-08 NOTE — Progress Notes (Addendum)
 [facility]                                                                              Anna Adams, is a 89 y.o. female, DOB - 1934/03/17, FMW:[id number] Admit date - 11/04/2023    Outpatient Primary MD for the patient is Anna, Glade PARAS, MD  LOS - 4  days  Chief Complaint  Patient presents with   Fall       Brief summary   Patient is a 89 year old female with CKD, HTN, weakness, adult FTT presented to ED after found down by her caretaker.  Patient was lying on her left side.  She was last seen normal night before and had dinner.  She was on the ground for unknown amount of time.  In the ED, patient could not recall if she tripped and fell or passed out. Son reported that on 9/29 he had brought her to the ED because she was dizzy and could not sit up in the bed.  Patient was given some IV fluids, treated for UTI and doing okay after that until the day of admission.  He also could not get patient does not drink very much, lives alone and has people check on her a couple of times a week. In ED patient was symptomatic just sitting up in the bed, felt dizzy and weak, admitted for further workup  Assessment & Plan    Acute respiratory failure with hypercarbia, acute metabolic encephalopathy - This morning, during rounds ~ 9 AM, noted patient very somnolent and hard to wake up - patient received tramadol  100 mg last night at 2007.  Otherwise not on any sedating meds. - Given Narcan 0.4 mg x2 with no significant response, symmetrical strength however difficult to do neuroexam as patient is not following commands.  Right-sided facial droop noted. - Stat CT head, ammonia level, ABG, BNP, CXR - ABG showed pH 7.21, pCO2 83, PaO2 67 - Transfer to SDU for BiPAP, updated patient's son, Anna Adams - If chest x-ray shows any pulmonary edema or elevated BNP, will place on IV Lasix  - If CT head shows any CVA, or no improvement after BiPAP, will discuss with neurology    Orthostatic  hypotension, dizziness - Furosemide  was held, received IVF  - Anemia panel within normal limits, B12 465, TSH 1.8 - A.m. cortisol level normal -Patient remembered after the admission that she had slipped on something and fell, may have hit her head.  CT head, C-spine showed no acute intracranial abnormality. -2D echo showed EF 70 to 75%, no acute valve stenosis - PT OT evaluation recommended HPT versus SNF. - d/w the son, she takes lasix  for feet swelling, no longer on amlodipine . Was started on lasix  a year ago. Recommended to use lasix  20mg  as needed for swelling, not 40mg  daily.  - Will check BNP, chest x-ray, if any pulmonary edema, will give her 1 dose of IV Lasix   Pseudomonas urinary tract infection - I was contacted by patient's PCP Dr. Geofm on 10/12 that outpatient urine culture PTA 10/8 showed Pseudomonas.  Unclear if this was done for symptomatic UTI.  Patient had complained of  generalized weakness. - Currently on IV cefepime  Left arm swelling - Venous Doppler showed no DVT, age-indeterminate focal superficial vein thrombosis involving the left cephalic vein at the AC/site of venipuncture.  Patient had an IV line there, removed. - Warm compresses, elevate arm    Fall at home, initial encounter -PT OT evaluation recommended SNF   Hypertension - Continue irbesartan - hydralazine IV as needed with parameters  Hyperlipidemia - Continue statin  Generalized debility, FTT - PT OT evaluation recommended HHPT vs SNF  -she has been living alone independently with people checking on her couple of times a week, does not have 24/7 supervision. She has been having recent falls, generalized weakness and will benefit from short term rehab. Meanwhile, family will try to arrange more help at home vs looking into ALF as more permanent solution for supervised setting.  - TOC consult  GERD - Continue famotidine   CKD stage IIIb - Currently creatinine at baseline    Estimated body mass  index is 29.97 kg/m as calculated from the following:   Height as of this encounter: 4' 11 (1.499 m).   Weight as of this encounter: 67.3 kg.  Code Status: Full code DVT Prophylaxis:  enoxaparin (LOVENOX) injection 40 mg Start: 11/04/23 2200   Level of Care: Level of care: Stepdown Family Communication: Updated patient's son, Anna Adams on the phone Disposition Plan:      Remains inpatient appropriate:   Pending SNF when bed available  The patient is critically ill with multiple organ systems failure and requires high complexity decision making for assessment and support, frequent evaluation, ordering and review of labs and radiographic studies and titration of therapies, discussion with consultants and nursing.  Critical care time devoted to patient care services described in this note: 40 minutes    Procedures:    Consultants:     Antimicrobials:   Anti-infectives (From admission, onward)    Start     Dose/Rate Route Frequency Ordered Stop   11/06/23 1300  ceFEPIme (MAXIPIME) 1 g in sodium chloride 0.9 % 100 mL IVPB        1 g 200 mL/hr over 30 Minutes Intravenous Every 24 hours 11/06/23 1149            Medications  atorvastatin   20 mg Oral QHS   B-complex with vitamin C  1 tablet Oral Daily   cholecalciferol  400 Units Oral Daily   enoxaparin (LOVENOX) injection  40 mg Subcutaneous Q24H   famotidine   40 mg Oral QHS   feeding supplement  237 mL Oral BID BM   folic acid  1 mg Oral Daily   irbesartan  75 mg Oral Daily   latanoprost  1 drop Both Eyes QHS   levothyroxine   125 mcg Oral Q0600   naLOXone (NARCAN)  injection  0.4 mg Intravenous Once   sodium chloride flush  3 mL Intravenous Q12H   sodium chloride flush  3 mL Intravenous Q12H      Subjective:   Tayonna Clontz was seen and examined today.  Noted during rounds this morning, patient very somnolent and lethargic, difficult to wake up.  Called RN, no fever or chills, nausea vomiting or any other acute issues  reported.  Received tramadol  overnight  Objective:   Vitals:   11/07/23 2106 11/08/23 0455 11/08/23 0457 11/08/23 0858  BP: (!) 116/55  (!) 107/49 126/65  Pulse: 65  74 80  Resp: 18  17 20   Temp: 98.3 F (36.8 C)  97.7  F (36.5 C)   TempSrc:   Oral   SpO2: 99%  96% 96%  Weight:  67.3 kg    Height:        Intake/Output Summary (Last 24 hours) at 11/08/2023 1027 Last data filed at 11/07/2023 1500 Gross per 24 hour  Intake 200 ml  Output --  Net 200 ml      Wt Readings from Last 3 Encounters:  11/08/23 67.3 kg  11/02/23 63 kg  05/25/23 63 kg   Physical Exam General: Somnolent and lethargic, difficult to wake up Cardiovascular: S1 S2 clear, RRR.  Respiratory: CTAB anteriorly Gastrointestinal: Soft, nontender, nondistended, NBS Ext: no pedal edema bilaterally, left arm swelling Neuro: Difficult to do neuroexam, patient is very somnolent, facial drooping noted Psych: Somnolent and lethargic   Data Reviewed:  I have personally reviewed following labs    CBC Lab Results  Component Value Date   WBC 8.7 11/08/2023   RBC 3.72 (L) 11/08/2023   HGB 10.4 (L) 11/08/2023   HCT 38.0 11/08/2023   MCV 102.2 (H) 11/08/2023   MCH 28.0 11/08/2023   PLT 263 11/08/2023   MCHC 27.4 (L) 11/08/2023   RDW 14.2 11/08/2023   LYMPHSABS 0.8 11/04/2023   MONOABS 0.7 11/04/2023   EOSABS 0.0 11/04/2023   BASOSABS 0.1 11/04/2023     Last metabolic panel Lab Results  Component Value Date   NA 139 11/08/2023   K 4.8 11/08/2023   CL 102 11/08/2023   CO2 31 11/08/2023   BUN 15 11/08/2023   CREATININE 1.01 (H) 11/08/2023   GLUCOSE 110 (H) 11/08/2023   GFRNONAA 54 (L) 11/08/2023   GFRAA 88 12/31/2019   CALCIUM  10.7 (H) 11/08/2023   PHOS 3.7 11/08/2023   PROT 6.2 (L) 11/04/2023   ALBUMIN 3.7 11/08/2023   BILITOT 0.6 11/04/2023   ALKPHOS 97 11/04/2023   AST 72 (H) 11/04/2023   ALT 41 11/04/2023   ANIONGAP 6 11/08/2023    CBG (last 3)  Recent Labs    11/07/23 2108  11/08/23 0453 11/08/23 0858  GLUCAP 97 114* 134*      Coagulation Profile: No results for input(s): INR, PROTIME in the last 168 hours.   Radiology Studies: I have personally reviewed the imaging studies  CT HEAD WO CONTRAST ( ) Result Date: 11/08/2023 EXAM: CT HEAD WITHOUT CONTRAST 11/08/2023 09:40:50 AM TECHNIQUE: CT of the head was performed without the administration of intravenous contrast. Automated exposure control, iterative reconstruction, and/or weight based adjustment of the mA/kV was utilized to reduce the radiation dose to as low as reasonably achievable. COMPARISON: CT head 11/04/2023. CLINICAL HISTORY: Mental status change, unknown cause. Mental status change, cause unknown; fall. Mental status change, unknown cause. Mental status change, cause unknown; fall. FINDINGS: BRAIN AND VENTRICLES: No acute hemorrhage. No evidence of acute infarct. Mild parenchymal volume loss. Nonspecific hypoattenuation in the periventricular and subcortical white matter, most likely representing chronic microvascular ischemic changes. Small remote lacunar infarct in the right basal ganglia. No hydrocephalus. No extra-axial collection. No mass effect or midline shift. ORBITS: No acute abnormality. SINUSES: Small right mastoid effusion. Mild mucosal thickening in the ethmoid sinuses. SOFT TISSUES AND SKULL: No acute soft tissue abnormality. No skull fracture. Atherosclerosis of the carotid siphons. IMPRESSION: 1. No acute intracranial abnormality. 2. Decreased scalp soft tissue swelling. 3. Mild parenchymal volume loss and chronic microvascular ischemic changes. 4. Small remote lacunar infarct in the right basal ganglia. Electronically signed by: Donnice Mania MD 11/08/2023 09:59 AM EDT RP Workstation: [id number]  VAS US  UPPER EXTREMITY VENOUS DUPLEX Result Date: 11/07/2023 UPPER VENOUS STUDY  Patient Name:  BAYLYN SICKLES  Date of Exam:   11/07/2023 Medical Rec #: [id number]     Accession #:     [id number] Date of Birth: [birth year]     Patient Gender: F Patient Age:   73 years Exam Location:  [facility] Procedure:      VAS US  UPPER EXTREMITY VENOUS DUPLEX Referring Phys: Jaydrien Wassenaar --------------------------------------------------------------------------------  Indications: Swelling Other Indications: Status post fall with unknown downtime. Limitations: Poor ultrasound/tissue interface, body habitus and Patient unable to keep arm turned. Comparison Study: No prior study on file Performing Technologist: Alberta Lis RVS  Examination Guidelines: A complete evaluation includes B-mode imaging, spectral Doppler, color Doppler, and power Doppler as needed of all accessible portions of each vessel. Bilateral testing is considered an integral part of a complete examination. Limited examinations for reoccurring indications may be performed as noted.  Right Findings: +----------+------------+---------+-----------+----------+-------+ RIGHT     CompressiblePhasicitySpontaneousPropertiesSummary +----------+------------+---------+-----------+----------+-------+ Subclavian               Yes       Yes                      +----------+------------+---------+-----------+----------+-------+  Left Findings: +----------+------------+---------+-----------+----------+---------------------+ LEFT      CompressiblePhasicitySpontaneousProperties       Summary        +----------+------------+---------+-----------+----------+---------------------+ IJV           Full       Yes       Yes                                    +----------+------------+---------+-----------+----------+---------------------+ Subclavian               Yes       Yes                                    +----------+------------+---------+-----------+----------+---------------------+ Axillary                 Yes       Yes                                     +----------+------------+---------+-----------+----------+---------------------+ Brachial      Full       Yes       Yes                                    +----------+------------+---------+-----------+----------+---------------------+ Radial        Full                                                        +----------+------------+---------+-----------+----------+---------------------+ Ulnar         Full                                                        +----------+------------+---------+-----------+----------+---------------------+  Cephalic    Partial                                      Focal, age                                                           indeterminate, at [facility]  +----------+------------+---------+-----------+----------+---------------------+ Basilic       Full                                                        +----------+------------+---------+-----------+----------+---------------------+  Summary:  Right: No evidence of superficial vein thrombosis in the upper extremity.  Left: No evidence of deep vein thrombosis in the upper extremity. Findings consistent with age indeterminate, focal superficial vein thrombosis involving the left cephalic vein at the AC/site of venepuncture.. Subcutaneous edema noted throughout the left upper extremity.  *See table(s) above for measurements and observations.  Diagnosing physician: Lonni Gaskins MD Electronically signed by Lonni Gaskins MD on 11/07/2023 at 7:19:14 PM.    Final        Nydia Distance M.D. [facility] 11/08/2023, 10:27 AM  Available via Epic secure chat 7am-7pm After 7 pm, please refer to night coverage provider listed on amion.

## 2023-11-08 NOTE — Progress Notes (Signed)
 OT Cancellation Note  Patient Details Name: Anna Adams MRN: [id number] DOB: [birth year]   Cancelled Treatment:    Reason Eval/Treat Not Completed: Medical issues which prohibited therapy. Pt transferred to ICU, will follow up next available time as appropriate.  Jacques Aquas [facility] 11/08/2023, 12:36 PM

## 2023-11-08 NOTE — Progress Notes (Signed)
 Transferred to SDU earlier today after rapid  ABG with some marginal interval improvement Now w drop in BP -- SBP <90, looks like bradycardia  Improving w IVF bolus Alertness improving with BP  P -stat bmp mag trop LA PCT ekg -appreciate charge obtaining addl IV access -may need periph NE -- order is in.  -will incr rate a bit on BiPAP  -will take over as primary  -ongoing goc w family     Ronnald Gave MSN, AGACNP-BC [facility] Pulmonary/Critical Care Medicine 11/08/2023, 1:57 PM

## 2023-11-08 NOTE — Consult Note (Incomplete)
 NAME:  Anna Adams, MRN:  [id number], DOB:  1935-01-05, LOS: 4 ADMISSION DATE:  11/04/2023, CONSULTATION DATE:  11/08/23 REFERRING MD:  TRH, CHIEF COMPLAINT:  fall   History of Present Illness:  89 year old woman w/ hx of CKD and HTN p/w fall at home, FTT, orthostasis.  OP urine culture showed UTI and abx started.  She was doing well 10/12 preparing for possible SNF per family but yesterday was more somnolent and this progressed this morning.  Workup for AMS shows acute on chronic CO2 retention, neg head CT.  Arrives to unit on BIPAP, PCCM to assist with management.  Pertinent  Medical History   Past Medical History:  Diagnosis Date   ALLERGIC RHINITIS    Anemia    Asthma    Asthma    Cataract    GERD (gastroesophageal reflux disease)    with HH   History of renal calculi    Hypertension    Hypothyroidism    Osteoarthritis    Parotid tumor    L side, s/p resection 1980 and 2007     Significant Hospital Events: Including procedures, antibiotic start and stop dates in addition to other pertinent events   10/10 admit 10/14 PCCM consult  Interim History / Subjective:  Consult  Objective    Blood pressure (!) 115/58, pulse 69, temperature 98.9 F (37.2 C), temperature source Axillary, resp. rate 14, height 4' 11 (1.499 m), weight 65.8 kg, SpO2 97%.        Intake/Output Summary (Last 24 hours) at 11/08/2023 1136 Last data filed at 11/07/2023 1500 Gross per 24 hour  Intake 200 ml  Output --  Net 200 ml   Filed Weights   11/07/23 0515 11/08/23 0455 11/08/23 1044  Weight: 67.9 kg 67.3 kg 65.8 kg    Examination: General: no distress HENT: BIPAP had leak so adjusted Lungs: diminished, requires fair bit of pressure to get adequate TV; improves with jaw thrust Cardiovascular: regular ext warm Abdomen: mildly protuberant, tympanic to percussion Extremities: LUE swelling and bruising noted, LUE duplex noted, LE not much edema Neuro: RASS -1, with enough prompting  moving ext x 4 Skin: age-related changes  Labs/imaging reviewed  Resolved problem list   Assessment and Plan  Acute on chronic hypercarbic respiratory failure FTT Encephalopathy- mostly hypercarbic suspected, improving with BIPAP LUE IV infiltration-   Labs   CBC: Recent Labs  Lab 11/04/23 1450 11/04/23 1457 11/05/23 0525 11/05/23 0845 11/06/23 0602 11/07/23 0517 11/08/23 0558  WBC 11.5*  --  9.2 8.5 6.8 8.3 8.7  NEUTROABS 9.8*  --   --   --   --   --   --   HGB 12.8   < > 11.2* 11.0* 10.4* 10.1* 10.4*  HCT 41.5   < > 36.0 35.8* 34.7* 35.1* 38.0  MCV 92.4  --  93.5 93.5 96.7 100.3* 102.2*  PLT 203  --  212 222 190 217 263   < > = values in this interval not displayed.    Basic Metabolic Panel: Recent Labs  Lab 11/04/23 1547 11/05/23 0525 11/05/23 0845 11/06/23 0602 11/07/23 0519 11/08/23 0558  NA 136  --  136 139 138 139  K 3.9  --  4.3 4.2 4.2 4.8  CL 96*  --  99 103 103 102  CO2 30  --  31 31 26 31   GLUCOSE 102*  --  86 78 80 110*  BUN 20  --  17 15 14 15   CREATININE  0.75 0.72 0.74 0.70 0.81 1.01*  CALCIUM  10.3  --  10.2 10.0 10.2 10.7*  PHOS  --   --   --  2.8 2.9 3.7   GFR: Estimated Creatinine Clearance: 32.3 mL/min (A) (by C-G formula based on SCr of 1.01 mg/dL (H)). Recent Labs  Lab 11/05/23 0845 11/06/23 0602 11/07/23 0517 11/08/23 0558  WBC 8.5 6.8 8.3 8.7    Liver Function Tests: Recent Labs  Lab 11/04/23 1547 11/06/23 0602 11/07/23 0519 11/08/23 0558  AST 72*  --   --   --   ALT 41  --   --   --   ALKPHOS 97  --   --   --   BILITOT 0.6  --   --   --   PROT 6.2*  --   --   --   ALBUMIN 3.7 3.3* 3.4* 3.7   No results for input(s): LIPASE, AMYLASE in the last 168 hours. Recent Labs  Lab 11/08/23 1015  AMMONIA 33    ABG    Component Value Date/Time   PHART 7.21 (L) 11/08/2023 0950   PCO2ART 83 (HH) 11/08/2023 0950   PO2ART 67 (L) 11/08/2023 0950   HCO3 33.2 (H) 11/08/2023 0950   TCO2 33 (H) 11/04/2023 1457   O2SAT  95.3 11/08/2023 0950     Coagulation Profile: No results for input(s): INR, PROTIME in the last 168 hours.  Cardiac Enzymes: Recent Labs  Lab 11/04/23 1547  CKTOTAL 157    HbA1C: Hgb A1c MFr Bld  Date/Time Value Ref Range Status  05/03/2023 03:27 PM 5.8 4.6 - 6.5 % Final    Comment:    Glycemic Control Guidelines for People with Diabetes:Non Diabetic:  <6%Goal of Therapy: <7%Additional Action Suggested:  >8%   11/22/2022 02:19 PM 5.4 4.6 - 6.5 % Final    Comment:    Glycemic Control Guidelines for People with Diabetes:Non Diabetic:  <6%Goal of Therapy: <7%Additional Action Suggested:  >8%     CBG: Recent Labs  Lab 11/07/23 0513 11/07/23 1745 11/07/23 2108 11/08/23 0453 11/08/23 0858  GLUCAP 85 138* 97 114* 134*    Review of Systems:   ***  Past Medical History:  She,  has a past medical history of ALLERGIC RHINITIS, Anemia, Asthma, Asthma, Cataract, GERD (gastroesophageal reflux disease), History of renal calculi, Hypertension, Hypothyroidism, Osteoarthritis, and Parotid tumor.   Surgical History:   Past Surgical History:  Procedure Laterality Date   CARDIOVASCULAR STRESS TEST  07/12/2005   EF 85%   CERVICAL DISCECTOMY  1993   GANGLION CYST EXCISION  1954 & 1963   (L) wrist x's 2   KNEE ARTHROPLASTY  04/22/2006   DR. MICAEL SIEVING CAFFREY   Left knee replacement  2005   LUMBAR DISC SURGERY     PAROTID GLAND TUMOR EXCISION  1980, 2007   Left   right knee replacement  2008   TONSILLECTOMY AND ADENOIDECTOMY     TUBAL LIGATION       Social History:   reports that she quit smoking about 61 years ago. Her smoking use included cigarettes. She has never used smokeless tobacco. She reports that she does not currently use alcohol. She reports that she does not use drugs.   Family History:  Her family history includes Asthma in her mother; Cancer in her mother; Heart disease in her father, maternal grandfather, and mother; Hyperlipidemia in her father; Hypertension  in her father and paternal grandfather; Mental illness in her father; Stroke in her father  and maternal grandmother.   Allergies Allergies  Allergen Reactions   Contrast Media [Iodinated Contrast Media] Other (See Comments)   Dyphylline-Guaifenesin    Erythromycin Nausea Only   Gabapentin      Affected memory   Mevacor [Lovastatin]     myalgias   Other     CT scan dye     Home Medications  Prior to Admission medications   Medication Sig Start Date End Date Taking? Authorizing Provider  acetaminophen (TYLENOL) 500 MG tablet Take 500 mg by mouth every 8 (eight) hours as needed for mild pain (pain score 1-3) or moderate pain (pain score 4-6).   Yes [provider]  atorvastatin  (LIPITOR) 20 MG tablet TAKE ONE TABLET BY MOUTH AT BEDTIME 05/19/23  Yes Burns, Glade PARAS, MD  b complex vitamins tablet Take 1 tablet by mouth daily.   Yes [provider]  Biotin 89999 MCG TABS See admin instructions.   Yes [provider]  famotidine  (PEPCID ) 40 MG tablet Take 1 tablet (40 mg total) by mouth daily. 05/03/23  Yes Burns, Glade PARAS, MD  latanoprost (XALATAN) 0.005 % ophthalmic solution Place 1 drop into both eyes at bedtime. 04/15/19  Yes [provider]  levothyroxine  (SYNTHROID ) 125 MCG tablet TAKE ONE TABLET BY MOUTH DAILY 06/30/23  Yes Burns, Glade PARAS, MD  telmisartan  (MICARDIS ) 20 MG tablet Take 0.5 tablets (10 mg total) by mouth daily. 11/02/23  Yes Burns, Glade PARAS, MD  traMADol  (ULTRAM ) 50 MG tablet TAKE 1 TABLET BY MOUTH EVERY 8 HOURS Patient taking differently: Take 50 mg by mouth every 8 (eight) hours as needed for moderate pain (pain score 4-6) or severe pain (pain score 7-10). 07/19/23  Yes Burns, Glade PARAS, MD  UNABLE TO FIND Take 1 Dose by mouth at bedtime. Osteo-ease   Yes [provider]  vitamin E 200 UNIT capsule Take 200 Units by mouth as needed.   Yes [provider]  furosemide  (LASIX ) 40 MG tablet TAKE 1 TABLET BY MOUTH EVERY  MORNING Patient not taking: Reported on 11/05/2023 10/19/23   Geofm Glade PARAS, MD     Critical care time: ***

## 2023-11-08 NOTE — Plan of Care (Signed)
   Problem: Education: Goal: Knowledge of condition and prescribed therapy will improve Outcome: Progressing   Problem: Cardiac: Goal: Will achieve and/or maintain adequate cardiac output Outcome: Progressing

## 2023-11-08 NOTE — Progress Notes (Signed)
 PT will be transferring to Step Down unit for Bipap use based on ABG results. ICU RT called for report and bipap orders.  RN aware.

## 2023-11-08 NOTE — Progress Notes (Signed)
 11/08/2023 Family would like everything short of CPR and endotracheal tube.  Code status updated.  Family would like update if any further deterioration.  Rolan Sharps MD PCCM

## 2023-11-09 DIAGNOSIS — N179 Acute kidney failure, unspecified: Secondary | ICD-10-CM

## 2023-11-09 DIAGNOSIS — N390 Urinary tract infection, site not specified: Secondary | ICD-10-CM | POA: Diagnosis not present

## 2023-11-09 DIAGNOSIS — G9341 Metabolic encephalopathy: Secondary | ICD-10-CM | POA: Diagnosis not present

## 2023-11-09 LAB — CBC
HCT: 31.8 % — ABNORMAL LOW (ref 36.0–46.0)
Hemoglobin: 9.4 g/dL — ABNORMAL LOW (ref 12.0–15.0)
MCH: 29.1 pg (ref 26.0–34.0)
MCHC: 29.6 g/dL — ABNORMAL LOW (ref 30.0–36.0)
MCV: 98.5 fL (ref 80.0–100.0)
Platelets: 203 K/uL (ref 150–400)
RBC: 3.23 MIL/uL — ABNORMAL LOW (ref 3.87–5.11)
RDW: 14 % (ref 11.5–15.5)
WBC: 7.7 K/uL (ref 4.0–10.5)
nRBC: 0 % (ref 0.0–0.2)

## 2023-11-09 LAB — BASIC METABOLIC PANEL WITH GFR
Anion gap: 10 (ref 5–15)
BUN: 26 mg/dL — ABNORMAL HIGH (ref 8–23)
CO2: 27 mmol/L (ref 22–32)
Calcium: 10.7 mg/dL — ABNORMAL HIGH (ref 8.9–10.3)
Chloride: 104 mmol/L (ref 98–111)
Creatinine, Ser: 1.71 mg/dL — ABNORMAL HIGH (ref 0.44–1.00)
GFR, Estimated: 29 mL/min — ABNORMAL LOW (ref >60)
Glucose, Bld: 82 mg/dL (ref 70–99)
Potassium: 4.6 mmol/L (ref 3.5–5.1)
Sodium: 141 mmol/L (ref 135–145)

## 2023-11-09 LAB — HEPATIC FUNCTION PANEL
ALT: 40 U/L (ref 0–44)
AST: 67 U/L — ABNORMAL HIGH (ref 15–41)
Albumin: 3.4 g/dL — ABNORMAL LOW (ref 3.5–5.0)
Alkaline Phosphatase: 79 U/L (ref 38–126)
Bilirubin, Direct: 0.3 mg/dL — ABNORMAL HIGH (ref 0.0–0.2)
Indirect Bilirubin: 0.2 mg/dL — ABNORMAL LOW (ref 0.3–0.9)
Total Bilirubin: 0.5 mg/dL (ref 0.0–1.2)
Total Protein: 5.9 g/dL — ABNORMAL LOW (ref 6.5–8.1)

## 2023-11-09 LAB — GLUCOSE, CAPILLARY
Glucose-Capillary: 83 mg/dL (ref 70–99)
Glucose-Capillary: 75 mg/dL (ref 70–99)

## 2023-11-09 LAB — VITAMIN D 25 HYDROXY (VIT D DEFICIENCY, FRACTURES): Vit D, 25-Hydroxy: 50.92 ng/mL (ref 30–100)

## 2023-11-09 MED ORDER — PIPERACILLIN-TAZOBACTAM IN DEX 2-0.25 GM/50ML IV SOLN
2.2500 g | Freq: Three times a day (TID) | INTRAVENOUS | Status: DC
  Administered 2023-11-09 – 2023-11-10 (×3): 2.25 g via INTRAVENOUS
  Filled 2023-11-09 (×4): qty 50

## 2023-11-09 MED ORDER — KETOROLAC TROMETHAMINE 15 MG/ML IJ SOLN
7.5000 mg | Freq: Once | INTRAMUSCULAR | Status: AC
  Administered 2023-11-09: 7.5 mg via INTRAVENOUS
  Filled 2023-11-09: qty 1

## 2023-11-09 MED ORDER — LIDOCAINE 5 % EX PTCH
2.0000 | MEDICATED_PATCH | CUTANEOUS | Status: DC
  Administered 2023-11-09 – 2023-11-15 (×4): 2 via TRANSDERMAL
  Filled 2023-11-09 (×5): qty 2

## 2023-11-09 NOTE — Progress Notes (Signed)
   11/09/23 2131  BiPAP/CPAP/SIPAP  BiPAP/CPAP/SIPAP Pt Type Adult  BiPAP/CPAP/SIPAP V60  Mask Type Full face mask  Mask Size Small  Set Rate 16 breaths/min  Respiratory Rate 17 breaths/min  IPAP 26 cmH20  EPAP 6 cmH2O  PEEP 6 cmH20  FiO2 (%) 40 %  Minute Ventilation 9.7  Leak 0  Peak Inspiratory Pressure (PIP) 26  Tidal Volume (Vt) 379  Patient Home Machine No  Patient Home Mask No  Patient Home Tubing No  Auto Titrate No  Press High Alarm 33 cmH2O  Press Low Alarm 5 cmH2O  Device Plugged into RED Power Outlet Yes  BiPAP/CPAP /SiPAP Vitals  Pulse Rate 70  Resp 17  SpO2 99 %  MEWS Score/Color  MEWS Score 1  MEWS Score Color Landy

## 2023-11-09 NOTE — Plan of Care (Signed)
  Problem: Activity: Goal: Risk for activity intolerance will decrease Outcome: Not Progressing   Problem: Nutrition: Goal: Adequate nutrition will be maintained Outcome: Not Progressing   Problem: Elimination: Goal: Will not experience complications related to urinary retention Outcome: Not Progressing   Problem: Education: Goal: Knowledge of condition and prescribed therapy will improve Outcome: Progressing   Problem: Cardiac: Goal: Will achieve and/or maintain adequate cardiac output Outcome: Progressing   Problem: Physical Regulation: Goal: Complications related to the disease process, condition or treatment will be avoided or minimized Outcome: Progressing   Problem: Education: Goal: Knowledge of General Education information will improve Description: Including pain rating scale, medication(s)/side effects and non-pharmacologic comfort measures Outcome: Progressing   Problem: Health Behavior/Discharge Planning: Goal: Ability to manage health-related needs will improve Outcome: Progressing   Problem: Clinical Measurements: Goal: Ability to maintain clinical measurements within normal limits will improve Outcome: Progressing Goal: Will remain free from infection Outcome: Progressing Goal: Diagnostic test results will improve Outcome: Progressing Goal: Respiratory complications will improve Outcome: Progressing Goal: Cardiovascular complication will be avoided Outcome: Progressing   Problem: Coping: Goal: Level of anxiety will decrease Outcome: Progressing   Problem: Elimination: Goal: Will not experience complications related to bowel motility Outcome: Progressing   Problem: Pain Managment: Goal: General experience of comfort will improve and/or be controlled Outcome: Progressing   Problem: Safety: Goal: Ability to remain free from injury will improve Outcome: Progressing   Problem: Skin Integrity: Goal: Risk for impaired skin integrity will  decrease Outcome: Progressing

## 2023-11-09 NOTE — Plan of Care (Signed)
  Problem: Cardiac: Goal: Will achieve and/or maintain adequate cardiac output Outcome: Progressing   Problem: Safety: Goal: Ability to remain free from injury will improve Outcome: Progressing   Problem: Skin Integrity: Goal: Risk for impaired skin integrity will decrease Outcome: Progressing

## 2023-11-09 NOTE — Progress Notes (Signed)
 Nutrition Follow-up  DOCUMENTATION CODES:   Not applicable  INTERVENTION:  - Will monitor for diet advancement.   - Once diet advanced, recommend:  - Boost Breeze po BID, each supplement provides 250 kcal and 9 grams of protein  - Magic cup BID with meals, each supplement provides 290 kcal and 9 grams of protein   NUTRITION DIAGNOSIS:   Inadequate oral intake related to decreased appetite as evidenced by per patient/family report, estimated needs. *ongoing  GOAL:   Patient will meet greater than or equal to 90% of their needs *not met  MONITOR:   PO intake, Supplement acceptance  REASON FOR ASSESSMENT:   Consult Assessment of nutrition requirement/status  ASSESSMENT:   Pt with hx CKD, HTN, FTT, and weakness. Admitted following being found down by caretaker w/ dizziness and weakness.  Patient resting in bed at time of visit, son and friend at bedside and provided all nutrition history.   UBW reported to be around 139# and they are unsure if she has had any changes in weight recently.  Per EMR, weight stable over the past year.   Son reports the patient eats around 2 meals a day but they are very small portions. Friend shares the patient tends to mostly snack throughout the day. They have tried providing her with Ensure but she doesn't like them.  They are unsure exactly how the patient was eating during admission before being made NPO yesterday. There is no meal intakes documented to assess intake. She is noted to have accepted 2 Ensures on 10/13 but did not receive any yesterday.   Patient now NPO due to rapid response yesterday with patient eventually requiring BiPAP. Patient on [place] this AM but remains lethargic and although is able to be awakened quickly falls back asleep.   Diet advancement pending improvement in mental status per CCM.  Discussed ONS with family who feel she may be willing to try Boost Breeze and [employer] (chocolate) once diet  advanced.   Medications reviewed and include: B complex with vitamin C, 400 units vitamin D , 1mg  folic acid  Labs reviewed:  Creatinine 1.71   NUTRITION - FOCUSED PHYSICAL EXAM:  Flowsheet Row Most Recent Value  Orbital Region Mild depletion  Upper Arm Region No depletion  Thoracic and Lumbar Region No depletion  Buccal Region No depletion  Temple Region No depletion  Clavicle Bone Region No depletion  Clavicle and Acromion Bone Region Mild depletion  Scapular Bone Region Unable to assess  Dorsal Hand Mild depletion  Patellar Region No depletion  Anterior Thigh Region No depletion  Posterior Calf Region No depletion  Edema (RD Assessment) None  Hair Reviewed  Eyes Reviewed  Mouth Unable to assess  Skin Reviewed  Nails Reviewed    Diet Order:   Diet Order             Diet NPO time specified Except for: Sips with Meds  Diet effective now                   EDUCATION NEEDS:  Not appropriate for education at this time  Skin:  Skin Assessment: Reviewed RN Assessment  Last BM:  10/13  Height:  Ht Readings from Last 1 Encounters:  11/05/23 4' 11 (1.499 m)   Weight:  Wt Readings from Last 1 Encounters:  11/09/23 68.1 kg   Ideal Body Weight:  44.7 kg  BMI:  Body mass index is 30.32 kg/m.  Estimated Nutritional Needs:  Kcal:  1300-1500 Protein:  50-70g Fluid:  1.3-1.5L    Trude Ned RD, LDN Contact via Secure Chat.

## 2023-11-09 NOTE — Consult Note (Signed)
 NAME:  Anna Adams, MRN:  [id number], DOB:  1935-01-10, LOS: 5 ADMISSION DATE:  11/04/2023, CONSULTATION DATE:  11/08/23 REFERRING MD:  TRH, CHIEF COMPLAINT:  fall    History of Present Illness:  89 year old woman w/ hx of CKD and HTN p/w fall at home, FTT, orthostasis.  OP urine culture showed UTI and abx started.  She was doing well 10/12 preparing for possible SNF per family but yesterday was more somnolent and this progressed this morning.  Workup for AMS shows acute on chronic CO2 retention, neg head CT.  Arrives to unit on BIPAP, PCCM to assist with management.   Pertinent  Medical History   Past Medical History:  Diagnosis Date   ALLERGIC RHINITIS    Anemia    Asthma    Asthma    Cataract    GERD (gastroesophageal reflux disease)    with HH   History of renal calculi    Hypertension    Hypothyroidism    Osteoarthritis    Parotid tumor    L side, s/p resection 1980 and 2007     Significant Hospital Events: Including procedures, antibiotic start and stop dates in addition to other pertinent events   10/10 admit 10/14 PCCM consult   Interim History / Subjective:  DNR yesterday   BPs improving, weaned off BiPAP this morning. Labs reviewed   Objective    Blood pressure (!) 142/55, pulse 71, temperature 99.4 F (37.4 C), temperature source Axillary, resp. rate 14, height 4' 11 (1.499 m), weight 68.1 kg, SpO2 95%.    FiO2 (%):  [40 %] 40 %   Intake/Output Summary (Last 24 hours) at 11/09/2023 1150 Last data filed at 11/09/2023 1017 Gross per 24 hour  Intake 947.48 ml  Output 300 ml  Net 647.48 ml   Filed Weights   11/08/23 0455 11/08/23 1044 11/09/23 0415  Weight: 67.3 kg 65.8 kg 68.1 kg    Examination: General: chronically and acutely ill elderly F HENT: NCAT  Lungs: diminished, unlabored  Cardiovascular: rrr Abdomen: soft round  Extremities: edematous  Neuro: drowsy, awakens easily and will interact, oriented x2, but falls asleep quickly  GU:  external catheter   Resolved problem list   Assessment and Plan   Acute encephalopathy, improving -?hypercarbia below, possible sepsis, had rcvd cefepime and think its unlikely to be contributor but is possible  P -minimize cns depressing agents  -delirium precautions  -tx underlying contributing causes   AoC hypercarbic resp failure P -weaned off BiPAP 10/15 -at bedtime and PRN BiPAP -O2 for goal >92 -would benefit from lasix  from pulm perspective however her intermittent orthostatic hypotension and recent hypotension/brady unclear etiology need to be a bit more stable -will order ted hose. Re-eval daily for diuresis   Transient hypotension (10/14)  Recent orthostatic hypotension -period of hypotension / bradycardia 10/14 without clear catalyst. Did get small fluid bolus. LA was normal, labs otherwise are assuring against ACS, and PCT was only 0.11  P -supportive care -might be a good candidate for midodrine as BP adjunct. Difficult to say until her diuretics/volume status needs are better understood  -deferring diuresis at this time, added ted hose   Pseudomonas UTI Possible Sepsis  -change mero to zosyn   AKI Hypermagnesemia  Hypercalcemia  P -follow renal indices, lytes uop -avoiding add'l NSAIDs as we can  -is on 30mg  lovenox -- fine to cont for now if renal fxn worsening can change to hep sub q  Hypothyroidism -synthroid   DNR GOC FTT  -PT/OT -when mentation better adv diet -pending her status in the coming days, might be reasonable to engage palliative care if not improving. Family notes her progressive functional decline   Will ask TRH to take over again 10/16   Labs   CBC: Recent Labs  Lab 11/04/23 1450 11/04/23 1457 11/06/23 0602 11/07/23 0517 11/08/23 0558 11/08/23 1341 11/09/23 0334  WBC 11.5*   < > 6.8 8.3 8.7 7.6 7.7  NEUTROABS 9.8*  --   --   --   --   --   --   HGB 12.8   < > 10.4* 10.1* 10.4* 9.1* 9.4*  HCT 41.5   < > 34.7* 35.1*  38.0 32.6* 31.8*  MCV 92.4   < > 96.7 100.3* 102.2* 101.2* 98.5  PLT 203   < > 190 217 263 224 203   < > = values in this interval not displayed.    Basic Metabolic Panel: Recent Labs  Lab 11/06/23 0602 11/07/23 0519 11/08/23 0558 11/08/23 1341 11/09/23 0334  NA 139 138 139 141 141  K 4.2 4.2 4.8 4.7 4.6  CL 103 103 102 104 104  CO2 31 26 31 30 27   GLUCOSE 78 80 110* 106* 82  BUN 15 14 15 17  26*  CREATININE 0.70 0.81 1.01* 1.31* 1.71*  CALCIUM  10.0 10.2 10.7* 10.5* 10.7*  MG  --   --   --  2.5*  --   PHOS 2.8 2.9 3.7  --   --    GFR: Estimated Creatinine Clearance: 19.5 mL/min (A) (by C-G formula based on SCr of 1.71 mg/dL (H)). Recent Labs  Lab 11/07/23 0517 11/08/23 0558 11/08/23 1330 11/08/23 1341 11/09/23 0334  PROCALCITON  --   --   --  0.11  --   WBC 8.3 8.7  --  7.6 7.7  LATICACIDVEN  --   --  0.7  --   --     Liver Function Tests: Recent Labs  Lab 11/04/23 1547 11/06/23 0602 11/07/23 0519 11/08/23 0558 11/09/23 0334  AST 72*  --   --   --  67*  ALT 41  --   --   --  40  ALKPHOS 97  --   --   --  79  BILITOT 0.6  --   --   --  0.5  PROT 6.2*  --   --   --  5.9*  ALBUMIN 3.7 3.3* 3.4* 3.7 3.4*   No results for input(s): LIPASE, AMYLASE in the last 168 hours. Recent Labs  Lab 11/08/23 1015  AMMONIA 33    ABG    Component Value Date/Time   PHART 7.25 (L) 11/08/2023 1258   PCO2ART 75 (HH) 11/08/2023 1258   PO2ART 101 11/08/2023 1258   HCO3 32.5 (H) 11/08/2023 1258   TCO2 33 (H) 11/04/2023 1457   O2SAT 100 11/08/2023 1258     Coagulation Profile: No results for input(s): INR, PROTIME in the last 168 hours.  Cardiac Enzymes: Recent Labs  Lab 11/04/23 1547  CKTOTAL 157    HbA1C: Hgb A1c MFr Bld  Date/Time Value Ref Range Status  05/03/2023 03:27 PM 5.8 4.6 - 6.5 % Final    Comment:    Glycemic Control Guidelines for People with Diabetes:Non Diabetic:  <6%Goal of Therapy: <7%Additional Action Suggested:  >8%   11/22/2022  02:19 PM 5.4 4.6 - 6.5 % Final    Comment:    Glycemic Control Guidelines for People with  Diabetes:Non Diabetic:  <6%Goal of Therapy: <7%Additional Action Suggested:  >8%     CBG: Recent Labs  Lab 11/07/23 2108 11/08/23 0453 11/08/23 0858 11/09/23 0421 11/09/23 0802  GLUCAP 97 114* 134* 83 75    High MDM    Ronnald Gave MSN, AGACNP-BC [facility] Amion for pager  11/09/2023, 11:50 AM

## 2023-11-09 NOTE — Progress Notes (Signed)
 RT took pt off bipap and placed 4L [place], pt doing well at this time.

## 2023-11-09 NOTE — Progress Notes (Signed)
 PT Cancellation Note  Patient Details Name: Anna Adams MRN: [id number] DOB: 25-May-1934   Cancelled Treatment:    Reason Eval/Treat Not Completed: Medical issues which prohibited therapy Pt moved to SDU for need for BiPAP. PT Will follow for medical stability to continue. SABRA Darice Potters PT Acute Rehabilitation Services Office [phone]   Potters Darice Norris 11/09/2023, 7:37 AM

## 2023-11-10 ENCOUNTER — Inpatient Hospital Stay (HOSPITAL_COMMUNITY)

## 2023-11-10 DIAGNOSIS — Z8673 Personal history of transient ischemic attack (TIA), and cerebral infarction without residual deficits: Secondary | ICD-10-CM | POA: Diagnosis not present

## 2023-11-10 DIAGNOSIS — R9089 Other abnormal findings on diagnostic imaging of central nervous system: Secondary | ICD-10-CM | POA: Diagnosis not present

## 2023-11-10 DIAGNOSIS — I6782 Cerebral ischemia: Secondary | ICD-10-CM | POA: Diagnosis not present

## 2023-11-10 DIAGNOSIS — I951 Orthostatic hypotension: Secondary | ICD-10-CM | POA: Diagnosis not present

## 2023-11-10 DIAGNOSIS — R29818 Other symptoms and signs involving the nervous system: Secondary | ICD-10-CM | POA: Diagnosis not present

## 2023-11-10 LAB — BASIC METABOLIC PANEL WITH GFR
Anion gap: 9 (ref 5–15)
BUN: 29 mg/dL — ABNORMAL HIGH (ref 8–23)
CO2: 27 mmol/L (ref 22–32)
Calcium: 11 mg/dL — ABNORMAL HIGH (ref 8.9–10.3)
Chloride: 103 mmol/L (ref 98–111)
Creatinine, Ser: 1.54 mg/dL — ABNORMAL HIGH (ref 0.44–1.00)
GFR, Estimated: 32 mL/min — ABNORMAL LOW (ref >60)
Glucose, Bld: 74 mg/dL (ref 70–99)
Potassium: 4.2 mmol/L (ref 3.5–5.1)
Sodium: 139 mmol/L (ref 135–145)

## 2023-11-10 LAB — CBC
HCT: 32.6 % — ABNORMAL LOW (ref 36.0–46.0)
Hemoglobin: 9.9 g/dL — ABNORMAL LOW (ref 12.0–15.0)
MCH: 29.4 pg (ref 26.0–34.0)
MCHC: 30.4 g/dL (ref 30.0–36.0)
MCV: 96.7 fL (ref 80.0–100.0)
Platelets: 237 K/uL (ref 150–400)
RBC: 3.37 MIL/uL — ABNORMAL LOW (ref 3.87–5.11)
RDW: 14.2 % (ref 11.5–15.5)
WBC: 7 K/uL (ref 4.0–10.5)
nRBC: 0 % (ref 0.0–0.2)

## 2023-11-10 LAB — GLUCOSE, CAPILLARY
Glucose-Capillary: 103 mg/dL — ABNORMAL HIGH (ref 70–99)
Glucose-Capillary: 116 mg/dL — ABNORMAL HIGH (ref 70–99)
Glucose-Capillary: 124 mg/dL — ABNORMAL HIGH (ref 70–99)
Glucose-Capillary: 55 mg/dL — ABNORMAL LOW (ref 70–99)
Glucose-Capillary: 66 mg/dL — ABNORMAL LOW (ref 70–99)
Glucose-Capillary: 72 mg/dL (ref 70–99)
Glucose-Capillary: 80 mg/dL (ref 70–99)
Glucose-Capillary: 98 mg/dL (ref 70–99)

## 2023-11-10 LAB — PARATHYROID HORMONE, INTACT (NO CA): PTH: 45 pg/mL (ref 15–65)

## 2023-11-10 LAB — MAGNESIUM: Magnesium: 2.6 mg/dL — ABNORMAL HIGH (ref 1.7–2.4)

## 2023-11-10 MED ORDER — SENNOSIDES-DOCUSATE SODIUM 8.6-50 MG PO TABS
2.0000 | ORAL_TABLET | Freq: Two times a day (BID) | ORAL | Status: DC
  Administered 2023-11-10 – 2023-11-13 (×7): 2 via ORAL
  Filled 2023-11-10 (×7): qty 2

## 2023-11-10 MED ORDER — POLYETHYLENE GLYCOL 3350 17 G PO PACK
17.0000 g | PACK | Freq: Every day | ORAL | Status: DC
  Administered 2023-11-10 – 2023-11-11 (×2): 17 g via ORAL
  Filled 2023-11-10 (×4): qty 1

## 2023-11-10 MED ORDER — DEXTROSE 10 % IV SOLN: INTRAVENOUS | Status: DC

## 2023-11-10 MED ORDER — MEGESTROL ACETATE 400 MG/10ML PO SUSP
400.0000 mg | Freq: Every day | ORAL | Status: DC
  Administered 2023-11-10 – 2023-11-13 (×4): 400 mg via ORAL
  Filled 2023-11-10 (×7): qty 10

## 2023-11-10 MED ORDER — BOOST / RESOURCE BREEZE PO LIQD CUSTOM
1.0000 | Freq: Two times a day (BID) | ORAL | Status: DC
  Administered 2023-11-10 (×2): 1 via ORAL

## 2023-11-10 MED ORDER — PIPERACILLIN-TAZOBACTAM 3.375 G IVPB: 3.3750 g | Freq: Three times a day (TID) | INTRAVENOUS | Status: DC

## 2023-11-10 MED ORDER — BISACODYL 10 MG RE SUPP: 10.0000 mg | Freq: Every day | RECTAL | Status: DC | PRN

## 2023-11-10 MED ORDER — PIPERACILLIN-TAZOBACTAM 3.375 G IVPB
3.3750 g | Freq: Three times a day (TID) | INTRAVENOUS | Status: AC
Start: 2023-11-10 — End: 2023-11-11
  Administered 2023-11-10 (×2): 3.375 g via INTRAVENOUS
  Filled 2023-11-10 (×2): qty 50

## 2023-11-10 MED ORDER — SODIUM CHLORIDE 0.9 % IV SOLN: INTRAVENOUS | Status: DC

## 2023-11-10 MED ORDER — HEPARIN SODIUM (PORCINE) 5000 UNIT/ML IJ SOLN
5000.0000 [IU] | Freq: Three times a day (TID) | INTRAMUSCULAR | Status: DC
  Administered 2023-11-10 – 2023-11-13 (×10): 5000 [IU] via SUBCUTANEOUS
  Filled 2023-11-10 (×10): qty 1

## 2023-11-10 NOTE — Hospital Course (Addendum)
 PMH of HTN, presented to the hospital secondary to fall. She was found down by her caretaker.  Patient was lying on her left side.  She was last seen normal night before and had dinner.  She was on the ground for unknown amount of time. patient could not recall if she tripped and fell or passed out. Son reported that on 9/29 he had brought her to the ED because she was dizzy and could not sit up in the bed.treated for UTI. During the hospital course becomes more lethargic and was found to have acute respiratory failure with hypercarbia and hypoxia requiring BiPAP therapy and has her blood pressure dropped requiring pressor therapy and was transferred to the ICU.  Assessment and Plan: Acute respiratory failure with hypercarbia,  acute metabolic encephalopathy pH of 7.2, pCO2 83.  After BiPAP pH improved pCO2 improved to 75. Most likely undiagnosed sleep apnea. Patient was with the ICU service. Recommendation is to continue nightly BiPAP Patient unable to tolerate BiPAP or CPAP both. Mentation progressively worsening after not using CPAP. Currently comfort care.  Recurrent fall. Dizziness. Left-sided weakness. Likely multifactorial. But patient does have left-sided weakness and facial droop. MRI brain negative for any acute stroke.   Orthostatic hypotension, dizziness Patient was aggressively diuresed. For now blood pressure does not drop significantly after changing position. Will monitor. B12 465, TSH 1.8 A.m. cortisol level normal 2D echo showed EF 70 to 75%, no acute valve stenosis   Pseudomonas urinary tract infection Went to see PCP on 10/12. Urine catheter was performed which grew Pseudomonas. Patient treated for 5 days with IV antibiotics.   SVT.  Left arm. Left arm swelling Venous Doppler showed no DVT, age-indeterminate focal superficial vein thrombosis involving the left cephalic vein at the AC/site of venipuncture.  Patient had an IV line there, removed. Warm  compresses, elevate arm   Hypertension Blood pressure is soft. Monitor.   Hyperlipidemia Was on statin.  AKI. Hypermagnesemia. Hypercalcemia. CKD 3B ruled out. Baseline renal function appears to be normal 0.8. Creatinine trended up to 1.7. After diuresis. Hypercalcemia has been treated with IV fluids for now.  Hypoglycemia from Poor p.o. intake. Adult failure to thrive. Currently receiving dextrose infusion.  Will complete infusion today and monitor CBG. Patient reports poor p.o. intake.  Remains hypoglycemic. Megace was given but no benefit.  Prognosis is poor  Hypothyroidism. Was on Synthroid . TSH normal.  Goals of care conversation. Palliative care discussed with the patient and the family. Currently DNR/DNI. Condition deteriorated rapidly over the last few days and currently comfort care only. Family is desirous to continue care here in the hospital. At present patient is requiring scheduled scopolamine patch as well as Robinul. Will monitor for progression.  Normocytic anemia. H&H relatively stable. Appears to be chronic in nature. Hemoglobin drop from 11-10 likely dilutional in nature.  Obesity Class 1 Body mass index is 31.88 kg/m.  Placing the pt at higher risk of poor outcomes.

## 2023-11-10 NOTE — Progress Notes (Signed)
 Hypoglycemic Event  CBG: 55  Treatment: 4 oz juice/soda  Symptoms: none  Follow-up CBG: Time:0615 CBG Result:80  Possible Reasons for Event: Inadequate meal intake  Comments/MD notified: Elink RN notified     Anna Adams

## 2023-11-10 NOTE — Progress Notes (Signed)
   11/10/23 2114  BiPAP/CPAP/SIPAP  BiPAP/CPAP/SIPAP Pt Type Adult  BiPAP/CPAP/SIPAP V60  Mask Type Full face mask  Dentures removed? Not applicable  Mask Size Small  Set Rate 16 breaths/min  Respiratory Rate 19 breaths/min  IPAP 26 cmH20  EPAP 6 cmH2O  PEEP 6 cmH20  FiO2 (%) 40 %  Minute Ventilation 12.3  Leak 0  Peak Inspiratory Pressure (PIP) 26  Tidal Volume (Vt) 861  Patient Home Machine No  Patient Home Mask No  Patient Home Tubing No  Auto Titrate No  Press High Alarm 33 cmH2O  Press Low Alarm 5 cmH2O  Device Plugged into RED Power Outlet Yes  BiPAP/CPAP /SiPAP Vitals  Pulse Rate 60  Resp 19  SpO2 95 %  MEWS Score/Color  MEWS Score 1  MEWS Score Color Anna Adams

## 2023-11-10 NOTE — Plan of Care (Signed)
  Problem: Cardiac: Goal: Will achieve and/or maintain adequate cardiac output Outcome: Progressing   Problem: Clinical Measurements: Goal: Cardiovascular complication will be avoided Outcome: Progressing   Problem: Elimination: Goal: Will not experience complications related to urinary retention Outcome: Progressing   Problem: Education: Goal: Knowledge of condition and prescribed therapy will improve Outcome: Not Progressing   Problem: Physical Regulation: Goal: Complications related to the disease process, condition or treatment will be avoided or minimized Outcome: Not Progressing   Problem: Health Behavior/Discharge Planning: Goal: Ability to manage health-related needs will improve Outcome: Not Progressing   Problem: Clinical Measurements: Goal: Respiratory complications will improve Outcome: Not Progressing   Problem: Activity: Goal: Risk for activity intolerance will decrease Outcome: Not Progressing   Problem: Nutrition: Goal: Adequate nutrition will be maintained Outcome: Not Progressing   Problem: Pain Managment: Goal: General experience of comfort will improve and/or be controlled Outcome: Not Progressing

## 2023-11-10 NOTE — Plan of Care (Signed)
   Problem: Nutrition: Goal: Adequate nutrition will be maintained Outcome: Progressing   Problem: Safety: Goal: Ability to remain free from injury will improve Outcome: Progressing   Problem: Skin Integrity: Goal: Risk for impaired skin integrity will decrease Outcome: Progressing

## 2023-11-10 NOTE — Progress Notes (Signed)
 Physical Therapy Treatment Patient Details Name: Anna Adams MRN: [id number] DOB: 1934/05/16 Today's Date: 11/10/2023   History of Present Illness Pt is an 89yo female presenting to [facility] ED on 11/04/2023 after being found down on her L side, LKN 10/9 evening; pt does not recall falling. Endorses 2 weeks of back pain and weakness. CT head no acute intracranial abnormality with Soft tissue swelling over the left temporal, posterior frontal, and  supraorbital scalp without underlying fracture or foreign body. CT spine with chronic anterolisthesis at C3-4 with advanced endplate and facet arthropathy and degenerative grade 1 anterolisthesis at C7-T1.  Recent episode of dizziness on 10/24/23  PMH: CKD, HTN, GERD, hypothyroidism, OA, cervical discectomy 1993, L-TKA 2005, R-TKA 2008.    PT Comments  +2 max assist for supine to sit, +2 mod assist sit to stand. Pt stood with RW for ~2.5 minutes and performed marching in place with RW. She initially denied dizziness in standing, then after ~2 minutes she reported dizziness, so she was assisted to the recliner. BP supine 109/50, sitting 153/69, pt unable to tolerate standing long enough to obtain standing BP.  Patient will benefit from continued inpatient follow up therapy, <3 hours/day.     If plan is discharge home, recommend the following: A lot of help with walking and/or transfers;A lot of help with bathing/dressing/bathroom;Assistance with cooking/housework;Direct supervision/assist for medications management;Direct supervision/assist for financial management;Assist for transportation;Help with stairs or ramp for entrance   Can travel by private vehicle     No  Equipment Recommendations  Other (comment) (TBD at next venue)    Recommendations for Other Services       Precautions / Restrictions Precautions Precautions: Fall Recall of Precautions/Restrictions: Impaired Precaution/Restrictions Comments: monitor vitals; pt oriented to self, location  and year, not oriented to month. Pt cannot recall events just prior to admission (doesn't remember falling). Restrictions Weight Bearing Restrictions Per Provider Order: No     Mobility  Bed Mobility Overal bed mobility: Needs Assistance Bed Mobility: Supine to Sit     Supine to sit: Max assist, +2 for safety/equipment, HOB elevated, Used rails     General bed mobility comments: pt able to partially advance BLEs to EOB, assist to raise trunk and pivot hips to EOB    Transfers Overall transfer level: Needs assistance Equipment used: Rolling walker (2 wheels) Transfers: Sit to/from Stand Sit to Stand: Mod assist, +2 physical assistance, +2 safety/equipment   Step pivot transfers: Min assist, +2 safety/equipment, +2 physical assistance       General transfer comment: assist to power up, pt stood for ~2 minutes with RW and performed marching in place, initially denied dizziness then after standing ~1.5 minutes reported some dizziness so was assisted to the recliner. Pt wasn't able to tolerate standing long enough to obtain standing BP. BP supine 109/50, sitting 153/69.    Ambulation/Gait                   Stairs             Wheelchair Mobility     Tilt Bed    Modified Rankin (Stroke Patients Only)       Balance   Sitting-balance support: No upper extremity supported, Feet supported Sitting balance-Leahy Scale: Fair     Standing balance support: During functional activity, Reliant on assistive device for balance, Bilateral upper extremity supported Standing balance-Leahy Scale: Poor  Communication Communication Communication: Impaired Factors Affecting Communication: Hearing impaired  Cognition Arousal: Alert Behavior During Therapy: WFL for tasks assessed/performed   PT - Cognitive impairments: No apparent impairments                       PT - Cognition Comments: AxO x 3 pleasant Lady  following all directions.  Unable to recall events that brought her here.  Son present during session.  Pt was living home alone IND with a care giver part time. Following commands: Intact      Cueing Cueing Techniques: Verbal cues  Exercises General Exercises - Lower Extremity Hip Flexion/Marching: AROM, Both, 15 reps, Standing    General Comments        Pertinent Vitals/Pain Pain Assessment Pain Score: 0-No pain    Home Living                          Prior Function            PT Goals (current goals can now be found in the care plan section) Acute Rehab PT Goals Patient Stated Goal: To feel better PT Goal Formulation: With patient/family Time For Goal Achievement: 11/19/23 Potential to Achieve Goals: Fair Progress towards PT goals: Progressing toward goals    Frequency    Min 2X/week      PT Plan      Co-evaluation PT/OT/SLP Co-Evaluation/Treatment: Yes Reason for Co-Treatment: Complexity of the patient's impairments (multi-system involvement);For patient/therapist safety;To address functional/ADL transfers PT goals addressed during session: Mobility/safety with mobility;Balance;Proper use of DME;Strengthening/ROM        AM-PAC PT 6 Clicks Mobility   Outcome Measure  Help needed turning from your back to your side while in a flat bed without using bedrails?: A Lot Help needed moving from lying on your back to sitting on the side of a flat bed without using bedrails?: Total Help needed moving to and from a bed to a chair (including a wheelchair)?: A Lot Help needed standing up from a chair using your arms (e.g., wheelchair or bedside chair)?: A Lot Help needed to walk in hospital room?: A Lot Help needed climbing 3-5 steps with a railing? : Total 6 Click Score: 10    End of Session Equipment Utilized During Treatment: Gait belt;Oxygen Activity Tolerance: Patient limited by fatigue Patient left: in chair;with call bell/phone within  reach;with chair alarm set;with family/visitor present;with nursing/sitter in room Nurse Communication: Mobility status PT Visit Diagnosis: Muscle weakness (generalized) (M62.81);Other abnormalities of gait and mobility (R26.89)     Time: 8940-8878 PT Time Calculation (min) (ACUTE ONLY): 22 min  Charges:    $Therapeutic Activity: 8-22 mins PT General Charges $$ ACUTE PT VISIT: 1 Visit                     Sylvan Delon Copp PT 11/10/2023  Acute Rehabilitation Services  Office [phone]

## 2023-11-10 NOTE — Progress Notes (Signed)
 Patient taken off Bipap and placed on 4L [place]. Patient is stable.

## 2023-11-10 NOTE — Progress Notes (Signed)
 [facility] Progress Note Patient: Anna Adams FMW:[id number] DOB: [birth year]  DOA: 11/04/2023 DOS: the patient was seen and examined on 11/10/2023  Brief Hospital Course: PMH of HTN, presented to the hospital secondary to fall. She was found down by her caretaker.  Patient was lying on her left side.  She was last seen normal night before and had dinner.  She was on the ground for unknown amount of time. patient could not recall if she tripped and fell or passed out. Son reported that on 9/29 he had brought her to the ED because she was dizzy and could not sit up in the bed.treated for UTI. During the hospital course becomes more lethargic and was found to have acute respiratory failure with hypercarbia and hypoxia requiring BiPAP therapy and has her blood pressure dropped requiring pressor therapy and was transferred to the ICU.  Assessment and Plan: Acute respiratory failure with hypercarbia,  acute metabolic encephalopathy pH of 7.2, pCO2 83.  After BiPAP pH improved pCO2 improved to 75. Most likely undiagnosed sleep apnea. Patient was with the ICU service. Recommendation is to continue nightly BiPAP for now. Mentation appears to be improving.  Recurrent fall. Dizziness. Left-sided weakness. Likely multifactorial. But patient does have left-sided weakness and facial droop. Will perform MRI brain to complete the workup for possible stroke.   Orthostatic hypotension, dizziness Patient was aggressively diuresed. For now blood pressure does not drop significantly after changing position. Will monitor. B12 465, TSH 1.8 A.m. cortisol level normal 2D echo showed EF 70 to 75%, no acute valve stenosis PT OT evaluation recommended HPT versus SNF.   Pseudomonas urinary tract infection Went to see PCP on 10/12. Urine catheter was performed which grew Pseudomonas. Patient treated for 5 days with IV antibiotics. Will stop the antibiotic and monitor.   SVT.  Left arm. Left arm  swelling Venous Doppler showed no DVT, age-indeterminate focal superficial vein thrombosis involving the left cephalic vein at the AC/site of venipuncture.  Patient had an IV line there, removed. - Warm compresses, elevate arm   Hypertension Blood pressure is soft. Currently holding oral antihypertensive medication. Monitor.   Hyperlipidemia Continue statin  AKI. Hypermagnesemia. Hypercalcemia. CKD 3B ruled out. Baseline renal function appears to be normal 0.8. Cram creatinine trended up to 1.7. After diuresis. Currently improving.  Monitor while receiving IV fluid. Hypercalcemia has been treated with IV fluids for now.  Hypoglycemia from Poor p.o. intake. Adult failure to thrive. Currently receiving dextrose infusion. Monitor CBG. Patient reports poor p.o. intake. Will initiate Megace for appetite stimulation. Prognosis is poor if the patient's appetite does not improve.  Hypothyroidism. Synthroid  continue. TSH normal.  Goals of care conversation. Palliative care discussed with the patient and the family. Currently DNR/DNI. If oral intake does not improve we will discuss for further clarity of goal of care. Does not appear to be able candidate for feeding tube placement permanently.  Normocytic anemia. H&H relatively stable. For now we will monitor. Appears to be chronic in nature. Hemoglobin drop from 11-10 likely dilutional in nature.  Obesity Class 1 Body mass index is 31.21 kg/m.  Placing the pt at higher risk of poor outcomes.   Subjective: Mentation improving.  No nausea no vomiting no fever no chills.  No chest pain.  Breathing better.  Swallowing test.  Still has some left-sided weakness as well as left facial droop.  Son at bedside states that his facial droop is more pronounced.  Physical Exam: Basal crackles. S1-S2 present Bowel sound  present Alert awake and oriented x 3.  Able to follow commands. Positive pronator drift on the left.  No  asterixis.  Data Reviewed: I have Reviewed nursing notes, Vitals, and Lab results. Since last encounter, pertinent lab results CBC and BMP   . I have ordered test including CBC and BMP  . I have ordered imaging MRI brain  .   Disposition: Status is: Inpatient Remains inpatient appropriate because: Monitor for improvement in mentation and oral intake  heparin injection 5,000 Units Start: 11/10/23 1400 Place TED hose Start: 11/09/23 1130   Family Communication: Son at bedside Level of care: Stepdown continue for now Vitals:   11/10/23 1000 11/10/23 1100 11/10/23 1127 11/10/23 1200  BP: (!) 125/43 (!) 109/50 (!) 153/69 (!) 139/49  Pulse: 64 79  62  Resp: 18 17  (!) 22  Temp:    97.9 F (36.6 C)  TempSrc:    Oral  SpO2: 99% 100%  100%  Weight:      Height:         Author: Yetta Blanch, MD 11/10/2023 2:45 PM  Please look on [email or web address] to find out who is on call.

## 2023-11-10 NOTE — Evaluation (Signed)
 Clinical/Bedside Swallow Evaluation Patient Details  Name: Anna Adams MRN: [id number] Date of Birth: 1934-05-20  Today's Date: 11/10/2023 Time: SLP Start Time (ACUTE ONLY): 1039 SLP Stop Time (ACUTE ONLY): 1055 SLP Time Calculation (min) (ACUTE ONLY): 16 min  Past Medical History:  Past Medical History:  Diagnosis Date   ALLERGIC RHINITIS    Anemia    Asthma    Asthma    Cataract    GERD (gastroesophageal reflux disease)    with HH   History of renal calculi    Hypertension    Hypothyroidism    Osteoarthritis    Parotid tumor    L side, s/p resection 1980 and 2007   Past Surgical History:  Past Surgical History:  Procedure Laterality Date   CARDIOVASCULAR STRESS TEST  07/12/2005   EF 85%   CERVICAL DISCECTOMY  1993   GANGLION CYST EXCISION  1954 & 1963   (L) wrist x's 2   KNEE ARTHROPLASTY  04/22/2006   DR. MICAEL SIEVING CAFFREY   Left knee replacement  2005   LUMBAR DISC SURGERY     PAROTID GLAND TUMOR EXCISION  1980, 2007   Left   right knee replacement  2008   TONSILLECTOMY AND ADENOIDECTOMY     TUBAL LIGATION     HPI:  89yo female admitted 11/04/23 with back pain and weakness x2 weeks, Fall with possible syncope. PMH: GERD, dizziness, hypothyroidism, HTN, CKD3, poor balance, FTT. Recently widowed, retired [employer] fundraiser. Afebrile, Lungs CTAB on admit. Rapid response 11/08/23 2/2 incr lethargy, right facial droop, weakness, required BiPAP 2/2 ABG results.    Assessment / Plan / Recommendation  Clinical Impression  Pt presents with mild right facial weakness, ROM WFL. CN exam otherwise unremarkable. Pt has adequate natural dentition. Pt/son report no history of dysphagia prior to admit. Pt accepted trials of thin liquid, puree, and solid textures. Adequate oral prep and clearing, no overt s/s aspiration across presentations.   Recommend regular diet/thin liquids to allow full range of PO choices. Meds as tolerated. No further ST intervention recommended at this time. Please  reconsult if needs arise.  SLP Visit Diagnosis: Dysphagia, unspecified (R13.10)    Aspiration Risk  Mild aspiration risk    Diet Recommendation Regular;Thin liquid    Liquid Administration via: Cup;Straw Medication Administration: Other (Comment) (as tolerated) Supervision: Patient able to self feed Compensations: Minimize environmental distractions;Slow rate;Small sips/bites Postural Changes: Remain upright for at least 30 minutes after po intake;Seated upright at 90 degrees    Other  Recommendations Oral Care Recommendations: Oral care BID     Assistance Recommended at Discharge  TBD  Functional Status Assessment Patient has not had a recent decline in their functional status      Prognosis Prognosis for improved oropharyngeal function: Fair      Swallow Study   General Date of Onset: 11/04/23 HPI: 89yo female admitted 11/04/23 with back pain and weakness x2 weeks, Fall with possible syncope. PMH: GERD, dizziness, hypothyroidism, HTN, CKD3, poor balance, FTT. Recently widowed, retired Charity fundraiser. Afebrile, Lungs CTAB on admit. Rapid response 11/08/23 2/2 incr lethargy, right facial droop, weakness, required BiPAP 2/2 ABG results. Type of Study: Bedside Swallow Evaluation Previous Swallow Assessment: none found Diet Prior to this Study: Dysphagia 3 (mechanical soft);Thin liquids (Level 0) Temperature Spikes Noted: No Respiratory Status: Nasal cannula History of Recent Intubation: No Behavior/Cognition: Alert;Cooperative;Pleasant mood Oral Cavity Assessment: Within Functional Limits Oral Care Completed by SLP: No Oral Cavity - Dentition: Adequate natural dentition Vision: Functional for self-feeding  Self-Feeding Abilities: Able to feed self Patient Positioning: Upright in bed Baseline Vocal Quality: Low vocal intensity Volitional Cough: Weak Volitional Swallow: Able to elicit    Oral/Motor/Sensory Function Overall Oral Motor/Sensory Function: Mild impairment Facial ROM: Reduced  right Facial Symmetry: Abnormal symmetry right Facial Strength: Within Functional Limits Facial Sensation: Within Functional Limits Lingual ROM: Within Functional Limits Lingual Symmetry: Within Functional Limits Lingual Strength: Within Functional Limits Lingual Sensation: Within Functional Limits Mandible: Within Functional Limits   Ice Chips Ice chips: Not tested   Thin Liquid Thin Liquid: Within functional limits Presentation: Self Fed;Straw    Nectar Thick Nectar Thick Liquid: Not tested   Honey Thick Honey Thick Liquid: Not tested   Puree Puree: Within functional limits Presentation: Self Fed   Solid     Solid: Within functional limits Presentation: Self Fed     Anna Adams, MSP, CCC-SLP Speech Language Pathologist  Adams Anna Adams 11/10/2023,11:01 AM

## 2023-11-10 NOTE — Progress Notes (Signed)
 Occupational Therapy Treatment Patient Details Name: Anna Adams MRN: [id number] DOB: [birth year] Today's Date: 11/10/2023   History of present illness Pt is an 89 yr old female presenting to the ED on 11/04/2023 after being found down on her L side at home; pt does not recall falling. Endorses 2 weeks of back pain and weakness. CT head no acute intracranial abnormality with Soft tissue swelling over the left temporal, posterior frontal, and  supraorbital scalp without underlying fracture or foreign body. CT spine with chronic anterolisthesis at C3-4 with advanced endplate and facet arthropathy and degenerative grade 1 anterolisthesis at C7-T1.  Recent episode of dizziness on 10/24/23  PMH: CKD, HTN, GERD, hypothyroidism, OA, cervical discectomy 1993, L TKA 2005, R TKA 2008.   OT comments  Pt was seen for functional strengthening, functional transfers, and progression of out of bed activity & ADL participation. She required max assist x2 for supine to sit, total assist to donn her socks, mod assist to stand using a RW, and min assist x2 to step-pivot to the chair. She demonstrated a standing tolerance of ~2.5 minutes using a RW, while performing marching in place & requiring assist for posterior peri-hygiene. She reported dizziness after ~2 minutes of standing. She further participated in face washing seated in the chair. Given presumed LUE weakness, and edema, she was instructed on LUE ROM and positioning to manage edema and to promote flexibility; grip strengthening was also facilitated with the pt instructed on the use of a resistance ball. Continue OT plan of care. Patient will benefit from continued inpatient follow up therapy, <3 hours/day.       If plan is discharge home, recommend the following:  A lot of help with bathing/dressing/bathroom;Direct supervision/assist for financial management;Direct supervision/assist for medications management;Assist for transportation;Help with stairs or ramp  for entrance;A lot of help with walking and/or transfers   Equipment Recommendations  BSC/3in1    Recommendations for Other Services      Precautions / Restrictions Precautions Precaution/Restrictions Comments: monitor vitals; pt oriented to self, location and year, not oriented to month. Pt cannot recall events just prior to admission (doesn't remember falling). Restrictions Weight Bearing Restrictions Per Provider Order: No       Mobility Bed Mobility Overal bed mobility: Needs Assistance Bed Mobility: Supine to Sit     Supine to sit: Max assist, +2 for safety/equipment, HOB elevated, Used rails     General bed mobility comments: pt able to partially advance BLEs to EOB, assist to raise trunk and pivot hips to EOB    Transfers Overall transfer level: Needs assistance Equipment used: Rolling walker (2 wheels) Transfers: Sit to/from Stand Sit to Stand: Mod assist, +2 physical assistance, +2 safety/equipment     Step pivot transfers: Min assist, +2 safety/equipment, +2 physical assistance     General transfer comment: assist to power up, pt stood for ~2 minutes with RW and performed marching in place, initially denied dizziness then after standing ~1.5 minutes reported some dizziness so was assisted to the recliner.      Balance     Sitting balance-Leahy Scale: Fair       Standing balance-Leahy Scale: Poor               ADL either performed or assessed with clinical judgement   ADL Overall ADL's : Needs assistance/impaired Eating/Feeding: Sitting;Set up;Supervision/ safety Eating/Feeding Details (indicate cue type and reason): Pt grasped a cup with her LUE and brought it to her mouth to sip from the  straw on one instance Grooming: Sitting;Set up;Supervision/safety Grooming Details (indicate cue type and reason): In supported sitting at chair level, she used a washcloth in her RUE to wash her face seated in the chair             Lower Body Dressing:  Total assistance;Bed level Lower Body Dressing Details (indicate cue type and reason): assist needed to donn socks in bed     Toileting- Clothing Manipulation and Hygiene: Maximal assistance;Sit to/from stand Toileting - Clothing Manipulation Details (indicate cue type and reason): Pt required assist for posterior peri-hygiene in standing at the edge of the bed. Pt required constant hands-on assist of walker in standing to prevent loss of balance.            Extremity/Trunk Assessment Upper Extremity Assessment Upper Extremity Assessment: LUE deficits/detail LUE Deficits / Details: Edema noted. Pt with difficulty demonstrating AROM for shoulder flexion to at least 90 degrees. Elbow and hand AROM WFL. Grip strength grossly 3+/5. MD present during portion of session and had the pt attempt  finger-to-nose; pt demonstrated moderate impairment in this regard   Lower Extremity Assessment Lower Extremity Assessment: Generalized weakness        Vision Baseline Vision/History: 1 Wears glasses           Communication Communication Communication: Impaired Factors Affecting Communication: Hearing impaired   Cognition Arousal: Alert Behavior During Therapy: WFL for tasks assessed/performed, Flat affect     Orientation impairments: Time, Situation   Memory impairment (select all impairments): Declarative long-term memory Attention impairment (select first level of impairment): Divided attention Executive functioning impairment (select all impairments): Problem solving        Following commands: Intact        Cueing   Cueing Techniques: Verbal cues  General comments: BP supine 109/50, sitting 153/69.             Pertinent Vitals/ Pain       Pain Assessment Pain Assessment: No/denies pain   Frequency  Min 2X/week        Progress Toward Goals  OT Goals(current goals can now be found in the care plan section)     Acute Rehab OT Goals OT Goal Formulation: With  patient/family Time For Goal Achievement: 11/20/23 Potential to Achieve Goals: Good  Plan      Co-evaluation    PT/OT/SLP Co-Evaluation/Treatment: Yes Reason for Co-Treatment: Complexity of the patient's impairments (multi-system involvement);For patient/therapist safety;To address functional/ADL transfers PT goals addressed during session: Mobility/safety with mobility;Balance;Proper use of DME;Strengthening/ROM OT goals addressed during session: ADL's and self-care;Proper use of Adaptive equipment and DME      AM-PAC OT 6 Clicks Daily Activity     Outcome Measure   Help from another person eating meals?: A Little (set-up assist) Help from another person taking care of personal grooming?: A Little Help from another person toileting, which includes using toliet, bedpan, or urinal?: A Lot Help from another person bathing (including washing, rinsing, drying)?: A Lot Help from another person to put on and taking off regular upper body clothing?: A Little Help from another person to put on and taking off regular lower body clothing?: A Lot 6 Click Score: 15    End of Session Equipment Utilized During Treatment: Gait belt;Rolling walker (2 wheels);Oxygen      Activity Tolerance Other (comment) (Fair+ tolerance)   Patient Left in chair;with call bell/phone within reach;with chair alarm set;with family/visitor present   Nurse Communication Mobility status;Other (comment) (Nurse cleared the pt  for therapy participation)        Time: 1100-1126 OT Time Calculation (min): 26 min  Charges: OT General Charges $OT Visit: 1 Visit OT Treatments $Self Care/Home Management : 8-22 mins     Delanna JINNY Lesches, OTR/L 11/10/2023, 12:45 PM

## 2023-11-11 DIAGNOSIS — I951 Orthostatic hypotension: Secondary | ICD-10-CM | POA: Diagnosis not present

## 2023-11-11 LAB — CBC
HCT: 33 % — ABNORMAL LOW (ref 36.0–46.0)
Hemoglobin: 9.8 g/dL — ABNORMAL LOW (ref 12.0–15.0)
MCH: 29 pg (ref 26.0–34.0)
MCHC: 29.7 g/dL — ABNORMAL LOW (ref 30.0–36.0)
MCV: 97.6 fL (ref 80.0–100.0)
Platelets: 227 K/uL (ref 150–400)
RBC: 3.38 MIL/uL — ABNORMAL LOW (ref 3.87–5.11)
RDW: 14.1 % (ref 11.5–15.5)
WBC: 6.8 K/uL (ref 4.0–10.5)
nRBC: 0 % (ref 0.0–0.2)

## 2023-11-11 LAB — RENAL FUNCTION PANEL
Albumin: 3.2 g/dL — ABNORMAL LOW (ref 3.5–5.0)
Anion gap: 7 (ref 5–15)
BUN: 25 mg/dL — ABNORMAL HIGH (ref 8–23)
CO2: 27 mmol/L (ref 22–32)
Calcium: 10.4 mg/dL — ABNORMAL HIGH (ref 8.9–10.3)
Chloride: 104 mmol/L (ref 98–111)
Creatinine, Ser: 0.97 mg/dL (ref 0.44–1.00)
GFR, Estimated: 56 mL/min — ABNORMAL LOW (ref >60)
Glucose, Bld: 108 mg/dL — ABNORMAL HIGH (ref 70–99)
Phosphorus: 2.9 mg/dL (ref 2.5–4.6)
Potassium: 4.1 mmol/L (ref 3.5–5.1)
Sodium: 137 mmol/L (ref 135–145)

## 2023-11-11 LAB — GLUCOSE, CAPILLARY
Glucose-Capillary: 100 mg/dL — ABNORMAL HIGH (ref 70–99)
Glucose-Capillary: 117 mg/dL — ABNORMAL HIGH (ref 70–99)
Glucose-Capillary: 124 mg/dL — ABNORMAL HIGH (ref 70–99)
Glucose-Capillary: 91 mg/dL (ref 70–99)
Glucose-Capillary: 99 mg/dL (ref 70–99)

## 2023-11-11 LAB — MAGNESIUM: Magnesium: 2.2 mg/dL (ref 1.7–2.4)

## 2023-11-11 MED ORDER — BOOST PLUS PO LIQD
237.0000 mL | Freq: Three times a day (TID) | ORAL | Status: DC
  Administered 2023-11-11 – 2023-11-13 (×6): 237 mL via ORAL
  Filled 2023-11-11 (×14): qty 237

## 2023-11-11 NOTE — Plan of Care (Signed)
  Problem: Education: Goal: Knowledge of condition and prescribed therapy will improve Outcome: Progressing   Problem: Physical Regulation: Goal: Complications related to the disease process, condition or treatment will be avoided or minimized Outcome: Progressing   Problem: Education: Goal: Knowledge of General Education information will improve Description: Including pain rating scale, medication(s)/side effects and non-pharmacologic comfort measures Outcome: Progressing   Problem: Health Behavior/Discharge Planning: Goal: Ability to manage health-related needs will improve Outcome: Progressing   Problem: Clinical Measurements: Goal: Will remain free from infection Outcome: Progressing Goal: Cardiovascular complication will be avoided Outcome: Progressing

## 2023-11-11 NOTE — Progress Notes (Signed)
   11/11/23 2327  BiPAP/CPAP/SIPAP  $ Non-Invasive Home Ventilator  Initial  BiPAP/CPAP/SIPAP Pt Type Adult  BiPAP/CPAP/SIPAP Resmed  Mask Type Full face mask  Dentures removed? Not applicable  Mask Size Small  Respiratory Rate 23 breaths/min  Flow Rate 4 lpm  Patient Home Machine No  Patient Home Mask No  Patient Home Tubing No  Auto Titrate Yes  Minimum cmH2O 5 cmH2O  Maximum cmH2O 20 cmH2O  CPAP/SIPAP surface wiped down Yes  Device Plugged into RED Power Outlet Yes  BiPAP/CPAP /SiPAP Vitals  Pulse Rate 74  Resp (!) 23  SpO2 97 %  MEWS Score/Color  MEWS Score 1  MEWS Score Color Green

## 2023-11-11 NOTE — Progress Notes (Signed)
 [facility] Progress Note Patient: Anna Adams FMW:[id number] DOB: 08-Mar-1934  DOA: 11/04/2023 DOS: the patient was seen and examined on 11/11/2023  Brief Hospital Course: PMH of HTN, presented to the hospital secondary to fall. She was found down by her caretaker.  Patient was lying on her left side.  She was last seen normal night before and had dinner.  She was on the ground for unknown amount of time. patient could not recall if she tripped and fell or passed out. Son reported that on 9/29 he had brought her to the ED because she was dizzy and could not sit up in the bed.treated for UTI. During the hospital course becomes more lethargic and was found to have acute respiratory failure with hypercarbia and hypoxia requiring BiPAP therapy and has her blood pressure dropped requiring pressor therapy and was transferred to the ICU.  Assessment and Plan: Acute respiratory failure with hypercarbia,  acute metabolic encephalopathy pH of 7.2, pCO2 83.  After BiPAP pH improved pCO2 improved to 75. Most likely undiagnosed sleep apnea. Patient was with the ICU service. Recommendation is to continue nightly BiPAP for now.  Unable to tolerate BiPAP.  Will switch to CPAP and monitor. Mentation appears to be improving.  Recurrent fall. Dizziness. Left-sided weakness. Likely multifactorial. But patient does have left-sided weakness and facial droop. Will perform MRI brain to complete the workup for possible stroke.   Orthostatic hypotension, dizziness Patient was aggressively diuresed. For now blood pressure does not drop significantly after changing position. Will monitor. B12 465, TSH 1.8 A.m. cortisol level normal 2D echo showed EF 70 to 75%, no acute valve stenosis PT OT evaluation recommended HPT versus SNF.   Pseudomonas urinary tract infection Went to see PCP on 10/12. Urine catheter was performed which grew Pseudomonas. Patient treated for 5 days with IV antibiotics.   SVT.   Left arm. Left arm swelling Venous Doppler showed no DVT, age-indeterminate focal superficial vein thrombosis involving the left cephalic vein at the AC/site of venipuncture.  Patient had an IV line there, removed. - Warm compresses, elevate arm   Hypertension Blood pressure is soft. Currently holding oral antihypertensive medication. Monitor.   Hyperlipidemia Continue statin  AKI. Hypermagnesemia. Hypercalcemia. CKD 3B ruled out. Baseline renal function appears to be normal 0.8. Cram creatinine trended up to 1.7. After diuresis. Currently improving.  Monitor while receiving IV fluid. Hypercalcemia has been treated with IV fluids for now.  Hypoglycemia from Poor p.o. intake. Adult failure to thrive. Currently receiving dextrose infusion. Monitor CBG. Patient reports poor p.o. intake. Will initiate Megace for appetite stimulation. Prognosis is poor if the patient's appetite does not improve.  Hypothyroidism. Synthroid  continue. TSH normal.  Goals of care conversation. Palliative care discussed with the patient and the family. Currently DNR/DNI. If oral intake does not improve we will discuss for further clarity of goal of care. Does not appear to be able candidate for feeding tube placement permanently.  Normocytic anemia. H&H relatively stable. For now we will monitor. Appears to be chronic in nature. Hemoglobin drop from 11-10 likely dilutional in nature.  Obesity Class 1 Body mass index is 31.21 kg/m.  Placing the pt at higher risk of poor outcomes.   Subjective: No nausea no vomiting minimal oral intake.  More tired today compared to yesterday.  Physical Exam: Basilar crackles. S1-S2 present Bowel sound present Bilateral upper and lower extremity edema.  Data Reviewed: I have Reviewed nursing notes, Vitals, and Lab results. Since last encounter, pertinent lab results  CBC BMP   . I have ordered test including CBC BMP  .   Disposition: Status is:  Inpatient Remains inpatient appropriate because: 1 for improvement in oral intake versus clarity of goal of care.  heparin injection 5,000 Units Start: 11/10/23 1400 Place TED hose Start: 11/09/23 1130   Family Communication: No one at bedside Level of care: Progressive   Vitals:   11/11/23 1600 11/11/23 1640 11/11/23 1700 11/11/23 1800  BP: 100/70  108/72 (!) 155/62  Pulse:   67 76  Resp: (!) 22  (!) 26 (!) 21  Temp:  98.3 F (36.8 C)    TempSrc:  Oral    SpO2:   96% 95%  Weight:      Height:         Author: Yetta Blanch, MD 11/11/2023 6:38 PM  Please look on [email or web address] to find out who is on call.

## 2023-11-12 DIAGNOSIS — I951 Orthostatic hypotension: Secondary | ICD-10-CM | POA: Diagnosis not present

## 2023-11-12 LAB — CBC
HCT: 37.7 % (ref 36.0–46.0)
Hemoglobin: 11 g/dL — ABNORMAL LOW (ref 12.0–15.0)
MCH: 29 pg (ref 26.0–34.0)
MCHC: 29.2 g/dL — ABNORMAL LOW (ref 30.0–36.0)
MCV: 99.5 fL (ref 80.0–100.0)
Platelets: 242 K/uL (ref 150–400)
RBC: 3.79 MIL/uL — ABNORMAL LOW (ref 3.87–5.11)
RDW: 13.7 % (ref 11.5–15.5)
WBC: 7.8 K/uL (ref 4.0–10.5)
nRBC: 0 % (ref 0.0–0.2)

## 2023-11-12 LAB — RENAL FUNCTION PANEL
Albumin: 3.4 g/dL — ABNORMAL LOW (ref 3.5–5.0)
Anion gap: 9 (ref 5–15)
BUN: 19 mg/dL (ref 8–23)
CO2: 24 mmol/L (ref 22–32)
Calcium: 11.1 mg/dL — ABNORMAL HIGH (ref 8.9–10.3)
Chloride: 104 mmol/L (ref 98–111)
Creatinine, Ser: 0.85 mg/dL (ref 0.44–1.00)
GFR, Estimated: >60 mL/min (ref >60)
Glucose, Bld: 110 mg/dL — ABNORMAL HIGH (ref 70–99)
Phosphorus: 2 mg/dL — ABNORMAL LOW (ref 2.5–4.6)
Potassium: 4.1 mmol/L (ref 3.5–5.1)
Sodium: 137 mmol/L (ref 135–145)

## 2023-11-12 LAB — GLUCOSE, CAPILLARY
Glucose-Capillary: 105 mg/dL — ABNORMAL HIGH (ref 70–99)
Glucose-Capillary: 112 mg/dL — ABNORMAL HIGH (ref 70–99)
Glucose-Capillary: 96 mg/dL (ref 70–99)
Glucose-Capillary: 99 mg/dL (ref 70–99)

## 2023-11-12 LAB — MAGNESIUM: Magnesium: 2.3 mg/dL (ref 1.7–2.4)

## 2023-11-12 MED ORDER — DEXTROSE 10 % IV SOLN
INTRAVENOUS | Status: AC
  Administered 2023-11-12: 30 mL/h via INTRAVENOUS

## 2023-11-12 NOTE — Progress Notes (Signed)
 [facility] Progress Note Patient: Anna Adams FMW:[id number] DOB: 1934/11/03  DOA: 11/04/2023 DOS: the patient was seen and examined on 11/12/2023  Brief Hospital Course: PMH of HTN, presented to the hospital secondary to fall. She was found down by her caretaker.  Patient was lying on her left side.  She was last seen normal night before and had dinner.  She was on the ground for unknown amount of time. patient could not recall if she tripped and fell or passed out. Son reported that on 9/29 he had brought her to the ED because she was dizzy and could not sit up in the bed.treated for UTI. During the hospital course becomes more lethargic and was found to have acute respiratory failure with hypercarbia and hypoxia requiring BiPAP therapy and has her blood pressure dropped requiring pressor therapy and was transferred to the ICU.  Assessment and Plan: Acute respiratory failure with hypercarbia,  acute metabolic encephalopathy pH of 7.2, pCO2 83.  After BiPAP pH improved pCO2 improved to 75. Most likely undiagnosed sleep apnea. Patient was with the ICU service. Recommendation is to continue nightly BiPAP for now.  Unable to tolerate BiPAP.  Will switch to CPAP and monitor. Most likely will require CPAP upon discharge but if not at least will require oxygen upon discharge. Mentation appears to be improving.  Recurrent fall. Dizziness. Left-sided weakness. Likely multifactorial. But patient does have left-sided weakness and facial droop. MRI brain negative for any acute stroke.   Orthostatic hypotension, dizziness Patient was aggressively diuresed. For now blood pressure does not drop significantly after changing position. Will monitor. B12 465, TSH 1.8 A.m. cortisol level normal 2D echo showed EF 70 to 75%, no acute valve stenosis PT OT evaluation recommended HPT versus SNF.   Pseudomonas urinary tract infection Went to see PCP on 10/12. Urine catheter was performed which  grew Pseudomonas. Patient treated for 5 days with IV antibiotics.   SVT.  Left arm. Left arm swelling Venous Doppler showed no DVT, age-indeterminate focal superficial vein thrombosis involving the left cephalic vein at the AC/site of venipuncture.  Patient had an IV line there, removed. - Warm compresses, elevate arm   Hypertension Blood pressure is soft. Currently holding oral antihypertensive medication. Monitor.   Hyperlipidemia Continue statin  AKI. Hypermagnesemia. Hypercalcemia. CKD 3B ruled out. Baseline renal function appears to be normal 0.8. Cram creatinine trended up to 1.7. After diuresis. Currently improving.  Monitor while receiving IV fluid. Hypercalcemia has been treated with IV fluids for now.  Hypoglycemia from Poor p.o. intake. Adult failure to thrive. Currently receiving dextrose infusion.  Will complete infusion today and monitor CBG. Patient reports poor p.o. intake. Will initiate Megace for appetite stimulation. Prognosis is poor if the patient's appetite does not improve.  Hypothyroidism. Synthroid  continue. TSH normal.  Goals of care conversation. Palliative care discussed with the patient and the family. Currently DNR/DNI. If oral intake does not improve we will discuss for further clarity of goal of care. Does not appear to be able candidate for feeding tube placement permanently.  Normocytic anemia. H&H relatively stable. For now we will monitor. Appears to be chronic in nature. Hemoglobin drop from 11-10 likely dilutional in nature.  Obesity Class 1 Body mass index is 31.21 kg/m.  Placing the pt at higher risk of poor outcomes.   Subjective: Mentation better.  She thinks that she is eating better.  No nausea no vomiting no fever no chills.  Denies any acute bleed.  Physical Exam: Clear to  auscultation. Bowel sound present No edema of lower extremity.  Left upper extremity edema seen. No asterixis.  Data Reviewed: I have  Reviewed nursing notes, Vitals, and Lab results. Since last encounter, pertinent lab results CBC and BMP   . I have ordered test including CBC and BMP  .   Disposition: Status is: Inpatient Remains inpatient appropriate because: Monitor for improvement in oral intake after stopping dextrose  heparin injection 5,000 Units Start: 11/10/23 1400 Place TED hose Start: 11/09/23 1130   Family Communication: Family at bedside Level of care: Progressive   Vitals:   11/12/23 0430 11/12/23 0800 11/12/23 1029 11/12/23 1408  BP:   135/74 133/85  Pulse:   66 82  Resp:  20 19   Temp:   97.7 F (36.5 C) 98.3 F (36.8 C)  TempSrc:   Oral Oral  SpO2:   100%   Weight: 71.5 kg     Height:         Author: Yetta Blanch, MD 11/12/2023 5:48 PM  Please look on [email or web address] to find out who is on call.

## 2023-11-12 NOTE — Plan of Care (Signed)

## 2023-11-12 NOTE — Progress Notes (Signed)
 Received pt from ICU to room 1411. Pt stable , VS WNL, no concerns voiced at this time. Skin tear noted to rt forearm. Cleansed and dressing placed. Foam placed over hematoma to lt forearm. All needs met and questions answered.

## 2023-11-12 NOTE — Plan of Care (Incomplete)
  Problem: Education: Goal: Knowledge of condition and prescribed therapy will improve Outcome: Progressing   Problem: Cardiac: Goal: Will achieve and/or maintain adequate cardiac output Outcome: Progressing   Problem: Physical Regulation: Goal: Complications related to the disease process, condition or treatment will be avoided or minimized Outcome: Progressing   Problem: Education: Goal: Knowledge of General Education information will improve Description: Including pain rating scale, medication(s)/side effects and non-pharmacologic comfort measures Outcome: Progressing   Problem: Health Behavior/Discharge Planning: Goal: Ability to manage health-related needs will improve Outcome: Progressing   

## 2023-11-13 DIAGNOSIS — I951 Orthostatic hypotension: Secondary | ICD-10-CM | POA: Diagnosis not present

## 2023-11-13 LAB — BLOOD GAS, ARTERIAL
Acid-Base Excess: 2.3 mmol/L — ABNORMAL HIGH (ref 0.0–2.0)
Bicarbonate: 31.8 mmol/L — ABNORMAL HIGH (ref 20.0–28.0)
Drawn by: 20012
FIO2: 28 %
O2 Content: 2 L/min
O2 Saturation: 97.2 %
Patient temperature: 37
pCO2 arterial: 76 mmHg (ref 32–48)
pH, Arterial: 7.23 — ABNORMAL LOW (ref 7.35–7.45)
pO2, Arterial: 93 mmHg (ref 83–108)

## 2023-11-13 LAB — CBC
HCT: 36.4 % (ref 36.0–46.0)
Hemoglobin: 11 g/dL — ABNORMAL LOW (ref 12.0–15.0)
MCH: 29.1 pg (ref 26.0–34.0)
MCHC: 30.2 g/dL (ref 30.0–36.0)
MCV: 96.3 fL (ref 80.0–100.0)
Platelets: 263 K/uL (ref 150–400)
RBC: 3.78 MIL/uL — ABNORMAL LOW (ref 3.87–5.11)
RDW: 13.7 % (ref 11.5–15.5)
WBC: 9.1 K/uL (ref 4.0–10.5)
nRBC: 0 % (ref 0.0–0.2)

## 2023-11-13 LAB — GLUCOSE, CAPILLARY
Glucose-Capillary: 82 mg/dL (ref 70–99)
Glucose-Capillary: 76 mg/dL (ref 70–99)
Glucose-Capillary: 71 mg/dL (ref 70–99)
Glucose-Capillary: 66 mg/dL — ABNORMAL LOW (ref 70–99)

## 2023-11-13 LAB — RENAL FUNCTION PANEL
Albumin: 3.4 g/dL — ABNORMAL LOW (ref 3.5–5.0)
Anion gap: 5 (ref 5–15)
BUN: 13 mg/dL (ref 8–23)
CO2: 30 mmol/L (ref 22–32)
Calcium: 11.3 mg/dL — ABNORMAL HIGH (ref 8.9–10.3)
Chloride: 104 mmol/L (ref 98–111)
Creatinine, Ser: 0.59 mg/dL (ref 0.44–1.00)
GFR, Estimated: >60 mL/min (ref >60)
Glucose, Bld: 96 mg/dL (ref 70–99)
Phosphorus: 2.1 mg/dL — ABNORMAL LOW (ref 2.5–4.6)
Potassium: 4.4 mmol/L (ref 3.5–5.1)
Sodium: 138 mmol/L (ref 135–145)

## 2023-11-13 LAB — MAGNESIUM: Magnesium: 2.4 mg/dL (ref 1.7–2.4)

## 2023-11-13 MED ORDER — HALOPERIDOL LACTATE 2 MG/ML PO CONC: 0.5000 mg | ORAL | Status: DC | PRN

## 2023-11-13 MED ORDER — LORAZEPAM 2 MG/ML IJ SOLN
1.0000 mg | INTRAMUSCULAR | Status: DC | PRN
  Administered 2023-11-15 (×4): 1 mg via INTRAVENOUS
  Filled 2023-11-13 (×3): qty 1

## 2023-11-13 MED ORDER — ONDANSETRON HCL 4 MG/2ML IJ SOLN: 4.0000 mg | Freq: Four times a day (QID) | INTRAMUSCULAR | Status: DC | PRN

## 2023-11-13 MED ORDER — HALOPERIDOL 0.5 MG PO TABS: 0.5000 mg | ORAL_TABLET | ORAL | Status: DC | PRN

## 2023-11-13 MED ORDER — POTASSIUM & SODIUM PHOSPHATES 280-160-250 MG PO PACK
1.0000 | PACK | Freq: Three times a day (TID) | ORAL | Status: DC
  Filled 2023-11-13: qty 1

## 2023-11-13 MED ORDER — LORAZEPAM 1 MG PO TABS: 1.0000 mg | ORAL_TABLET | ORAL | Status: DC | PRN

## 2023-11-13 MED ORDER — ACETAMINOPHEN 650 MG RE SUPP: 650.0000 mg | Freq: Four times a day (QID) | RECTAL | Status: DC | PRN

## 2023-11-13 MED ORDER — HALOPERIDOL LACTATE 5 MG/ML IJ SOLN: 0.5000 mg | INTRAMUSCULAR | Status: DC | PRN

## 2023-11-13 MED ORDER — ACETAMINOPHEN 325 MG PO TABS
650.0000 mg | ORAL_TABLET | Freq: Four times a day (QID) | ORAL | Status: DC | PRN
  Administered 2023-11-14: 650 mg via ORAL
  Filled 2023-11-13: qty 2

## 2023-11-13 MED ORDER — LORAZEPAM 2 MG/ML PO CONC: 1.0000 mg | ORAL | Status: DC | PRN

## 2023-11-13 MED ORDER — ONDANSETRON 4 MG PO TBDP: 4.0000 mg | ORAL_TABLET | Freq: Four times a day (QID) | ORAL | Status: DC | PRN

## 2023-11-13 MED ORDER — GLYCOPYRROLATE 0.2 MG/ML IJ SOLN
0.2000 mg | INTRAMUSCULAR | Status: DC | PRN
  Administered 2023-11-14 – 2023-11-15 (×2): 0.2 mg via INTRAVENOUS
  Filled 2023-11-13 (×3): qty 1

## 2023-11-13 MED ORDER — GLYCOPYRROLATE 1 MG PO TABS: 1.0000 mg | ORAL_TABLET | ORAL | Status: DC | PRN

## 2023-11-13 MED ORDER — HYDROMORPHONE HCL 1 MG/ML IJ SOLN
0.5000 mg | INTRAMUSCULAR | Status: DC | PRN
  Administered 2023-11-14: 0.5 mg via INTRAVENOUS
  Administered 2023-11-14 – 2023-11-15 (×2): 1 mg via INTRAVENOUS
  Filled 2023-11-13 (×3): qty 1

## 2023-11-13 MED ORDER — GLYCOPYRROLATE 0.2 MG/ML IJ SOLN: 0.2000 mg | INTRAMUSCULAR | Status: DC | PRN

## 2023-11-13 MED ORDER — MORPHINE SULFATE (CONCENTRATE) 10 MG /0.5 ML PO SOLN: 5.0000 mg | ORAL | Status: DC | PRN

## 2023-11-13 MED ORDER — MORPHINE SULFATE (CONCENTRATE) 10 MG /0.5 ML PO SOLN
5.0000 mg | ORAL | Status: DC | PRN
  Administered 2023-11-15 (×3): 5 mg via SUBLINGUAL
  Filled 2023-11-13 (×3): qty 0.5

## 2023-11-13 NOTE — Progress Notes (Signed)
   11/13/23 0028  BiPAP/CPAP/SIPAP  $ Non-Invasive Home Ventilator  Subsequent  BiPAP/CPAP/SIPAP Pt Type Adult  BiPAP/CPAP/SIPAP Resmed  Mask Type Full face mask  Dentures removed? Not applicable  Mask Size Small  Flow Rate 3 lpm  Patient Home Machine No  Patient Home Mask No  Patient Home Tubing No  Auto Titrate Yes  Minimum cmH2O 5 cmH2O  Maximum cmH2O 20 cmH2O  Device Plugged into RED Power Outlet Yes  BiPAP/CPAP /SiPAP Vitals  Resp (!) 29  MEWS Score/Color  MEWS Score 2  MEWS Score Color Yellow

## 2023-11-13 NOTE — Progress Notes (Signed)
   11/13/23 1431  BiPAP/CPAP/SIPAP  $ Non-Invasive Ventilator  Non-Invasive Vent Set Up;Non-Invasive Vent Initial  $ Face Mask Medium Yes  BiPAP/CPAP/SIPAP Pt Type Adult  BiPAP/CPAP/SIPAP (S)  SERVO  Mask Type Full face mask  Dentures removed? Not applicable  Mask Size (S)  Small  Set Rate (S)  18 breaths/min  Respiratory Rate 27 breaths/min  IPAP (S)  15 cmH20 (Set PC above peep @ 10, peep 5 = 15/5.)  EPAP (S)  5 cmH2O  PEEP 5 cmH20  FiO2 (%) (S)  35 %  Flow Rate 0.91 lpm  Minute Ventilation 8.7  Leak 52  Peak Inspiratory Pressure (PIP) 19  Tidal Volume (Vt) 433  Patient Home Machine No  Patient Home Mask No  Patient Home Tubing No  Auto Titrate No  Press High Alarm 25 cmH2O  Nasal massage performed No (comment)  CPAP/SIPAP surface wiped down Yes  Device Plugged into RED Power Outlet Yes  Oxygen Percent 35 %  BiPAP/CPAP /SiPAP Vitals  Pulse Rate 75  Resp (!) 24  SpO2 96 %  Bilateral Breath Sounds Diminished  MEWS Score/Color  MEWS Score 1  MEWS Score Color Green

## 2023-11-13 NOTE — Progress Notes (Signed)
 PROGRESS NOTE    Yolinda Duerr Logsdon  FMW:[id number] DOB: 01-Nov-1934 DOA: 11/04/2023 PCP: Geofm Glade PARAS, MD    Brief Narrative:  PMH of HTN, presented to the hospital secondary to fall. She was found down by her caretaker.  Patient was lying on her left side.  She was last seen normal night before and had dinner.  She was on the ground for unknown amount of time. patient could not recall if she tripped and fell or passed out. Son reported that on 9/29 he had brought her to the ED because she was dizzy and could not sit up in the bed.treated for UTI. During the hospital course became more lethargic and was found to have acute respiratory failure with hypercarbia and hypoxia requiring BiPAP therapy and has her blood pressure dropped requiring pressor therapy and was transferred to the ICU.  Subjective: Patient seen and examined.  No overnight events.  She was sleeping in between conversations.  Patient's son Oneil at the bedside.  He thinks he is much more awake than when she had the event of ICU transfer however patient is still intermittently sleepy. Did not wear CPAP last night. Agree with SNF referral.  Will benefit with palliative care follow-up.   Assessment & Plan:   Acute respiratory failure with hypercarbia, acute metabolic encephalopathy pH of 7.2, pCO2 83.  After BiPAP pH improved pCO2 improved to 75. Most likely undiagnosed sleep apnea. Patient was with the ICU service. Recommendation is to continue nightly BiPAP-did not tolerate BiPAP.  Could not put on CPAP last night.  Prescribing CPAP will not achieve any compliance, will stay on supplemental oxygen.     Recurrent fall. Dizziness. Left-sided weakness. Likely multifactorial. But patient does have left-sided weakness and facial droop. MRI brain negative for any acute stroke.   Orthostatic hypotension, dizziness Patient was aggressively diuresed. For now blood pressure does not drop significantly after changing  position. Will monitor. B12 465, TSH 1.8 A.m. cortisol level normal 2D echo showed EF 70 to 75%, no acute valve stenosis PT OT evaluation recommended HPT versus SNF.   Pseudomonas urinary tract infection Went to see PCP on 10/12. Urine catheter was performed which grew Pseudomonas. Patient treated for 5 days with IV antibiotics.  Completed antibiotics.   SVT.  Left arm. Left arm swelling Venous Doppler showed no DVT, age-indeterminate focal superficial vein thrombosis involving the left cephalic vein at the AC/site of venipuncture.  Patient had an IV line there, removed. - Warm compresses, elevate arm   Hypertension Blood pressure is soft. Currently holding oral antihypertensive medication. Monitor.   Hyperlipidemia Continue statin   AKI. Hypermagnesemia. Hypercalcemia. CKD 3B ruled out. Baseline renal function appears to be normal 0.8. Cram creatinine trended up to 1.7 after diuresis.  Currently normalizing.  Will need outpatient follow-up.   Hypoglycemia from Poor p.o. intake. Adult failure to thrive. Was treated with dextrose infusion.  Blood sugars stable last 24 hours.  On Megace for appetite stimulation.   Hypothyroidism. Synthroid  continue. TSH normal.   Goals of care conversation. Palliative care discussed with the patient and the family. Currently DNR/DNI. If oral intake does not improve we will discuss for further clarity of goal of care. Does not appear to be able candidate for feeding tube placement permanently.     Normocytic anemia. H&H relatively stable. For now we will monitor. Appears to be chronic in nature. Hemoglobin drop from 11-10 likely dilutional in nature.   Obesity Class 1 Body mass index is 31.21 kg/m.  Placing the pt at higher risk of poor outcomes.     DVT prophylaxis: heparin injection 5,000 Units Start: 11/10/23 1400 Place TED hose Start: 11/09/23 1130   Code Status: DNR/DNI Family Communication: Son at the  bedside Disposition Plan: Status is: Inpatient Remains inpatient appropriate because: Medically stabilizing, needs SNF.     Consultants:  Critical care Palliative care  Procedures:  None  Antimicrobials:  Completed     Objective: Vitals:   11/13/23 0028 11/13/23 0100 11/13/23 0510 11/13/23 0523  BP:   (!) 145/63   Pulse:   83   Resp: (!) 29 15 18    Temp:   98 F (36.7 C)   TempSrc:   Oral   SpO2:   98%   Weight:    71.6 kg  Height:        Intake/Output Summary (Last 24 hours) at 11/13/2023 1146 Last data filed at 11/13/2023 1041 Gross per 24 hour  Intake 801.41 ml  Output 700 ml  Net 101.41 ml   Filed Weights   11/11/23 0500 11/12/23 0430 11/13/23 0523  Weight: 70.6 kg 71.5 kg 71.6 kg    Examination:  General: Chronically sick looking.  Frail. Cardiovascular: S1-S2 normal.  Regular rate rhythm. Respiratory: Poor air entry bilateral.  On minimal supplemental oxygen. Gastrointestinal: Soft.  Nontender.  Bowel sound present. Ext: Extremity swelling present.  She has some ecchymosis. Neuro: Alert and awake.  Mostly oriented.  Sleepy.  Very weak but not focal.     Data Reviewed: I have personally reviewed following labs and imaging studies  CBC: Recent Labs  Lab 11/09/23 0334 11/10/23 0814 11/11/23 0340 11/12/23 0308 11/13/23 0542  WBC 7.7 7.0 6.8 7.8 9.1  HGB 9.4* 9.9* 9.8* 11.0* 11.0*  HCT 31.8* 32.6* 33.0* 37.7 36.4  MCV 98.5 96.7 97.6 99.5 96.3  PLT 203 237 227 242 263   Basic Metabolic Panel: Recent Labs  Lab 11/07/23 0519 11/08/23 0558 11/08/23 1341 11/09/23 0334 11/10/23 0814 11/11/23 0340 11/12/23 0308 11/13/23 0542  NA 138 139 141 141 139 137 137 138  K 4.2 4.8 4.7 4.6 4.2 4.1 4.1 4.4  CL 103 102 104 104 103 104 104 104  CO2 26 31 30 27 27 27 24 30   GLUCOSE 80 110* 106* 82 74 108* 110* 96  BUN 14 15 17  26* 29* 25* 19 13  CREATININE 0.81 1.01* 1.31* 1.71* 1.54* 0.97 0.85 0.59  CALCIUM  10.2 10.7* 10.5* 10.7* 11.0* 10.4*  11.1* 11.3*  MG  --   --  2.5*  --  2.6* 2.2 2.3 2.4  PHOS 2.9 3.7  --   --   --  2.9 2.0* 2.1*   GFR: Estimated Creatinine Clearance: 42.7 mL/min (by C-G formula based on SCr of 0.59 mg/dL). Liver Function Tests: Recent Labs  Lab 11/08/23 0558 11/09/23 0334 11/11/23 0340 11/12/23 0308 11/13/23 0542  AST  --  67*  --   --   --   ALT  --  40  --   --   --   ALKPHOS  --  79  --   --   --   BILITOT  --  0.5  --   --   --   PROT  --  5.9*  --   --   --   ALBUMIN 3.7 3.4* 3.2* 3.4* 3.4*   No results for input(s): LIPASE, AMYLASE in the last 168 hours. Recent Labs  Lab 11/08/23 1015  AMMONIA 33   Coagulation Profile:  No results for input(s): INR, PROTIME in the last 168 hours. Cardiac Enzymes: No results for input(s): CKTOTAL, CKMB, CKMBINDEX, TROPONINI in the last 168 hours. BNP (last 3 results) Recent Labs    11/08/23 1104  PROBNP 485.0*   HbA1C: No results for input(s): HGBA1C in the last 72 hours. CBG: Recent Labs  Lab 11/12/23 2042 11/12/23 2352 11/13/23 0350 11/13/23 0731 11/13/23 1111  GLUCAP 105* 99 82 76 71   Lipid Profile: No results for input(s): CHOL, HDL, LDLCALC, TRIG, CHOLHDL, LDLDIRECT in the last 72 hours. Thyroid  Function Tests: No results for input(s): TSH, T4TOTAL, FREET4, T3FREE, THYROIDAB in the last 72 hours. Anemia Panel: No results for input(s): VITAMINB12, FOLATE, FERRITIN, TIBC, IRON, RETICCTPCT in the last 72 hours. Sepsis Labs: Recent Labs  Lab 11/08/23 1330 11/08/23 1341  PROCALCITON  --  0.11  LATICACIDVEN 0.7  --     Recent Results (from the past 240 hours)  Urine Culture     Status: Abnormal   Collection Time: 11/04/23  4:36 PM   Specimen: Urine, Clean Catch  Result Value Ref Range Status   Specimen Description   Final    URINE, CLEAN CATCH Performed at [facility], [address], [place], [place] [address]    Special Requests   Final     NONE Performed at [facility], [address], [place], [place] [address]    Culture (A)  Final    10,000 COLONIES/mL ESCHERICHIA COLI 70,000 COLONIES/mL PSEUDOMONAS AERUGINOSA    Report Status 11/08/2023 FINAL  Final   Organism ID, Bacteria PSEUDOMONAS AERUGINOSA (A)  Final   Organism ID, Bacteria ESCHERICHIA COLI (A)  Final      Susceptibility   Escherichia coli - MIC*    AMPICILLIN <=2 SENSITIVE Sensitive     CEFAZOLIN (URINE) Value in next row Sensitive      2 SENSITIVEThis is a modified FDA-approved test that has been validated and its performance characteristics determined by the reporting laboratory.  This laboratory is certified under the Clinical Laboratory Improvement Amendments CLIA as qualified to perform high complexity clinical laboratory testing.    CEFEPIME Value in next row Sensitive      2 SENSITIVEThis is a modified FDA-approved test that has been validated and its performance characteristics determined by the reporting laboratory.  This laboratory is certified under the Clinical Laboratory Improvement Amendments CLIA as qualified to perform high complexity clinical laboratory testing.    ERTAPENEM Value in next row Sensitive      2 SENSITIVEThis is a modified FDA-approved test that has been validated and its performance characteristics determined by the reporting laboratory.  This laboratory is certified under the Clinical Laboratory Improvement Amendments CLIA as qualified to perform high complexity clinical laboratory testing.    CEFTRIAXONE Value in next row Sensitive      2 SENSITIVEThis is a modified FDA-approved test that has been validated and its performance characteristics determined by the reporting laboratory.  This laboratory is certified under the Clinical Laboratory Improvement Amendments CLIA as qualified to perform high complexity clinical laboratory testing.    CIPROFLOXACIN  Value in next row Sensitive      2 SENSITIVEThis is a modified  FDA-approved test that has been validated and its performance characteristics determined by the reporting laboratory.  This laboratory is certified under the Clinical Laboratory Improvement Amendments CLIA as qualified to perform high complexity clinical laboratory testing.    GENTAMICIN Value in next row Sensitive      2  SENSITIVEThis is a modified FDA-approved test that has been validated and its performance characteristics determined by the reporting laboratory.  This laboratory is certified under the Clinical Laboratory Improvement Amendments CLIA as qualified to perform high complexity clinical laboratory testing.    NITROFURANTOIN  Value in next row Sensitive      2 SENSITIVEThis is a modified FDA-approved test that has been validated and its performance characteristics determined by the reporting laboratory.  This laboratory is certified under the Clinical Laboratory Improvement Amendments CLIA as qualified to perform high complexity clinical laboratory testing.    TRIMETH /SULFA  Value in next row Sensitive      2 SENSITIVEThis is a modified FDA-approved test that has been validated and its performance characteristics determined by the reporting laboratory.  This laboratory is certified under the Clinical Laboratory Improvement Amendments CLIA as qualified to perform high complexity clinical laboratory testing.    AMPICILLIN/SULBACTAM Value in next row Sensitive      2 SENSITIVEThis is a modified FDA-approved test that has been validated and its performance characteristics determined by the reporting laboratory.  This laboratory is certified under the Clinical Laboratory Improvement Amendments CLIA as qualified to perform high complexity clinical laboratory testing.    PIP/TAZO Value in next row Sensitive      <=4 SENSITIVEThis is a modified FDA-approved test that has been validated and its performance characteristics determined by the reporting laboratory.  This laboratory is certified under the  Clinical Laboratory Improvement Amendments CLIA as qualified to perform high complexity clinical laboratory testing.    MEROPENEM Value in next row Sensitive      <=4 SENSITIVEThis is a modified FDA-approved test that has been validated and its performance characteristics determined by the reporting laboratory.  This laboratory is certified under the Clinical Laboratory Improvement Amendments CLIA as qualified to perform high complexity clinical laboratory testing.    * 10,000 COLONIES/mL ESCHERICHIA COLI   Pseudomonas aeruginosa - MIC*    MEROPENEM Value in next row Sensitive      <=4 SENSITIVEThis is a modified FDA-approved test that has been validated and its performance characteristics determined by the reporting laboratory.  This laboratory is certified under the Clinical Laboratory Improvement Amendments CLIA as qualified to perform high complexity clinical laboratory testing.    CIPROFLOXACIN  Value in next row Sensitive      <=4 SENSITIVEThis is a modified FDA-approved test that has been validated and its performance characteristics determined by the reporting laboratory.  This laboratory is certified under the Clinical Laboratory Improvement Amendments CLIA as qualified to perform high complexity clinical laboratory testing.    PIP/TAZO Value in next row Sensitive      <=4 SENSITIVEThis is a modified FDA-approved test that has been validated and its performance characteristics determined by the reporting laboratory.  This laboratory is certified under the Clinical Laboratory Improvement Amendments CLIA as qualified to perform high complexity clinical laboratory testing.    CEFEPIME Value in next row Sensitive      <=4 SENSITIVEThis is a modified FDA-approved test that has been validated and its performance characteristics determined by the reporting laboratory.  This laboratory is certified under the Clinical Laboratory Improvement Amendments CLIA as qualified to perform high complexity clinical  laboratory testing.    CEFTAZIDIME Value in next row Sensitive      <=4 SENSITIVEThis is a modified FDA-approved test that has been validated and its performance characteristics determined by the reporting laboratory.  This laboratory is certified under the Clinical Laboratory Improvement Amendments CLIA as qualified to  perform high complexity clinical laboratory testing.    * 70,000 COLONIES/mL PSEUDOMONAS AERUGINOSA  MRSA Next Gen by PCR, Nasal     Status: None   Collection Time: 11/08/23 10:38 AM   Specimen: Nasal Mucosa; Nasal Swab  Result Value Ref Range Status   MRSA by PCR Next Gen NOT DETECTED NOT DETECTED Final    Comment: (NOTE) The GeneXpert MRSA Assay (FDA approved for NASAL specimens only), is one component of a comprehensive MRSA colonization surveillance program. It is not intended to diagnose MRSA infection nor to guide or monitor treatment for MRSA infections. Test performance is not FDA approved in patients less than 67 years old. Performed at [facility], [address] [place], [place] [address]          Radiology Studies: No results found.      Scheduled Meds:  B-complex with vitamin C  1 tablet Oral Daily   Chlorhexidine Gluconate Cloth  6 each Topical Daily   famotidine   20 mg Oral QHS   folic acid  1 mg Oral Daily   heparin injection (subcutaneous)  5,000 Units Subcutaneous Q8H   lactose free nutrition  237 mL Oral TID BM   latanoprost  1 drop Both Eyes QHS   levothyroxine   125 mcg Oral Q0600   lidocaine   2 patch Transdermal Q24H   megestrol  400 mg Oral Daily   polyethylene glycol  17 g Oral Daily   potassium & sodium phosphates  1 packet Oral TID WC & HS   senna-docusate  2 tablet Oral BID   sodium chloride flush  3 mL Intravenous Q12H   Continuous Infusions:   LOS: 9 days    Time spent: 45 minutes    Renato Applebaum, MD [facility]

## 2023-11-13 NOTE — Consult Note (Signed)
 Consultation Note Date: 11/13/2023   Patient Name: Anna Adams  DOB: Jun 13, 1934  MRN: [id number]  Age / Sex: 89 y.o., female  PCP: Geofm Glade PARAS, MD Referring Physician: Raenelle Coria, MD  Reason for Consultation: Establishing goals of care  HPI/Patient Profile: 89 y.o. female admitted on 11/04/2023  89 year old female with past medical history of hypertension admitted on 11/04/2023 after being found down by her caretaker. She was lying on her left side for an unknown duration. Last known normal was the night before after dinner. The patient is unable to recall if she tripped and fell or lost consciousness. Her son reported a recent ED visit on 9/29 for dizziness, treated for UTI. During this hospitalization, she developed progressive lethargy, acute respiratory failure with hypercarbia and hypoxia requiring BiPAP and transient vasopressor support. She was transferred to ICU for management and has since been stabilized and transferred to the hospital medicine service.   Clinical Assessment and Goals of Care: A palliative consult has been requested for ongoing goals of care discussions.   Chart reviewed Patient seen and examined  Palliative medicine is specialized medical care for people living with serious illness. It focuses on providing relief from the symptoms and stress of a serious illness. The goal is to improve quality of life for both the patient and the family.  Goals of care: Broad aims of medical therapy in relation to the patient's values and preferences. Our aim is to provide medical care aimed at enabling patients to achieve the goals that matter most to them, given the circumstances of their particular medical situation and their constraints.   Patient with acute respiratory failure with hypercarbia and metabolic encephalopathy, likely secondary to undiagnosed sleep apnea, compounded by recent  infection and deconditioning.Course complicated by falls, orthostatic hypotension, and recurrent dizziness. Patient with ongoing decline in functional status and appetite, monitor PO intake,if oral intake continues to decline, further discussion will be needed regarding hospice and comfort-focused care options.   Patient is not an appropriate candidate for long-term feeding tube placement - Given her advanced age, intermittent encephalopathy, and poor tolerance of prior interventions, the likelihood of meaningful recovery or improved quality of life with a feeding tube is low. Tube feeding may increase discomfort and risk of complications without altering the overall prognosis.  HCPOA  Son Anna Adams [phone]   SUMMARY OF RECOMMENDATIONS    Call placed and was able to discuss with son Anna Adams, we talked about the patient's current condition and her current hospitalization. We talked about time trial of the next 2-3 days, to see the extent of patient's improvement in PO intake and mental status etc. If no clear recovery, then would proceed with comfort measures. If some degree of rally,then would consider SNF rehab with palliative.   Continue supplemental oxygen as tolerated; avoid BiPAP if poorly tolerated.  Continue Megace for appetite stimulation and encourage small frequent meals as tolerated.  Monitor for delirium and promote comfort, sleep, and orientation.  Recommend SNF placement for continued medical support and  rehabilitation as appropriate, with the addition of Palliative care.   Code Status/Advance Care Planning: DNR   Symptom Management:   Dyspnea: Continue oxygen for comfort 2 L O2 [place].  Fatigue/Somnolence: Supportive measures; optimize sleep hygiene. Anorexia: Continue Megace. Pain: Monitor and treat as needed.  Palliative Prophylaxis:  Delirium Protocol    Psycho-social/Spiritual:  Desire for further Chaplaincy support:yes Additional Recommendations: Caregiving   Support/Resources  Prognosis:  Guarded, given frailty, recurrent respiratory failure, and poor oral intake.  Discharge Planning: Plan for SNF placement for rehabilitation and ongoing palliative care follow-up.      Primary Diagnoses: Present on Admission:  Orthostatic hypotension  Syncope and collapse  Stage 3b chronic kidney disease (HCC)  GERD (gastroesophageal reflux disease)  UTI (urinary tract infection)   I have reviewed the medical record, interviewed the patient and family, and examined the patient. The following aspects are pertinent.  Past Medical History:  Diagnosis Date   ALLERGIC RHINITIS    Anemia    Asthma    Asthma    Cataract    GERD (gastroesophageal reflux disease)    with HH   History of renal calculi    Hypertension    Hypothyroidism    Osteoarthritis    Parotid tumor    L side, s/p resection 1980 and 2007   Social History   Socioeconomic History   Marital status: Widowed    Spouse name: Not on file   Number of children: 2   Years of education: Not on file   Highest education level: Associate degree: academic program  Occupational History   Not on file  Tobacco Use   Smoking status: Former    Current packs/day: 0.00    Types: Cigarettes    Quit date: 09/01/1962    Years since quitting: 61.2   Smokeless tobacco: Never  Vaping Use   Vaping status: Never Used  Substance and Sexual Activity   Alcohol use: Not Currently    Comment: occasionally   Drug use: No   Sexual activity: Not on file  Other Topics Concern   Not on file  Social History Narrative   Recently widowed, lives with her dogs, retired Charity fundraiser   Right handed   Caffeine: up to 4-5 cups/day (coffee and tea)   Social Drivers of Corporate investment banker Strain: Low Risk  (05/25/2023)   Overall Financial Resource Strain (CARDIA)    Difficulty of Paying Living Expenses: Not hard at all  Food Insecurity: No Food Insecurity (11/05/2023)   Hunger Vital Sign    Worried About  Running Out of Food in the Last Year: Never true    Ran Out of Food in the Last Year: Never true  Transportation Needs: No Transportation Needs (11/07/2023)   PRAPARE - Administrator, Civil Service (Medical): No    Lack of Transportation (Non-Medical): No  Physical Activity: Inactive (05/25/2023)   Exercise Vital Sign    Days of Exercise per Week: 0 days    Minutes of Exercise per Session: 0 min  Stress: No Stress Concern Present (05/25/2023)   [employer] of Occupational Health - Occupational Stress Questionnaire    Feeling of Stress : Not at all  Social Connections: Socially Isolated (11/07/2023)   Social Connection and Isolation Panel    Frequency of Communication with Friends and Family: More than three times a week    Frequency of Social Gatherings with Friends and Family: More than three times a week    Attends Religious Services:  Never    Active Member of Clubs or Organizations: No    Attends Banker Meetings: Never    Marital Status: Widowed   Family History  Problem Relation Age of Onset   Asthma Mother    Cancer Mother    Heart disease Mother    Hypertension Father    Heart disease Father    Hyperlipidemia Father    Stroke Father    Mental illness Father    Stroke Maternal Grandmother    Heart disease Maternal Grandfather    Hypertension Paternal Grandfather    Scheduled Meds:  B-complex with vitamin C  1 tablet Oral Daily   Chlorhexidine Gluconate Cloth  6 each Topical Daily   famotidine   20 mg Oral QHS   folic acid  1 mg Oral Daily   heparin injection (subcutaneous)  5,000 Units Subcutaneous Q8H   lactose free nutrition  237 mL Oral TID BM   latanoprost  1 drop Both Eyes QHS   levothyroxine   125 mcg Oral Q0600   lidocaine   2 patch Transdermal Q24H   megestrol  400 mg Oral Daily   polyethylene glycol  17 g Oral Daily   potassium & sodium phosphates  1 packet Oral TID WC & HS   senna-docusate  2 tablet Oral BID   sodium chloride  flush  3 mL Intravenous Q12H   Continuous Infusions: PRN Meds:.acetaminophen **OR** acetaminophen, alum & mag hydroxide-simeth, bisacodyl, ondansetron **OR** ondansetron (ZOFRAN) IV, sodium chloride flush Medications Prior to Admission:  Prior to Admission medications   Medication Sig Start Date End Date Taking? Authorizing Provider  acetaminophen (TYLENOL) 500 MG tablet Take 500 mg by mouth every 8 (eight) hours as needed for mild pain (pain score 1-3) or moderate pain (pain score 4-6).   Yes [provider]  atorvastatin  (LIPITOR) 20 MG tablet TAKE ONE TABLET BY MOUTH AT BEDTIME 05/19/23  Yes Burns, Glade PARAS, MD  b complex vitamins tablet Take 1 tablet by mouth daily.   Yes [provider]  Biotin 89999 MCG TABS See admin instructions.   Yes [provider]  famotidine  (PEPCID ) 40 MG tablet Take 1 tablet (40 mg total) by mouth daily. 05/03/23  Yes Burns, Glade PARAS, MD  latanoprost (XALATAN) 0.005 % ophthalmic solution Place 1 drop into both eyes at bedtime. 04/15/19  Yes [provider]  levothyroxine  (SYNTHROID ) 125 MCG tablet TAKE ONE TABLET BY MOUTH DAILY 06/30/23  Yes Burns, Glade PARAS, MD  telmisartan  (MICARDIS ) 20 MG tablet Take 0.5 tablets (10 mg total) by mouth daily. 11/02/23  Yes Burns, Glade PARAS, MD  traMADol  (ULTRAM ) 50 MG tablet TAKE 1 TABLET BY MOUTH EVERY 8 HOURS Patient taking differently: Take 50 mg by mouth every 8 (eight) hours as needed for moderate pain (pain score 4-6) or severe pain (pain score 7-10). 07/19/23  Yes Burns, Glade PARAS, MD  UNABLE TO FIND Take 1 Dose by mouth at bedtime. Osteo-ease   Yes [provider]  vitamin E 200 UNIT capsule Take 200 Units by mouth as needed.   Yes [provider]  furosemide  (LASIX ) 40 MG tablet TAKE 1 TABLET BY MOUTH EVERY MORNING Patient not taking: Reported on 11/05/2023 10/19/23   Geofm Glade PARAS, MD   Allergies  Allergen Reactions   Contrast Media [Iodinated Contrast Media] Other (See  Comments)   Dyphylline-Guaifenesin    Erythromycin Nausea Only   Gabapentin      Affected memory   Mevacor [Lovastatin]     myalgias  Other     CT scan dye   Review of Systems Weakness  Physical Exam Sitting up in chair with eyes closed Doesn't interact or verbalize  Appears with generalized weakness  Vital Signs: BP (!) 145/63 (BP Location: Left Arm)   Pulse 83   Temp 98 F (36.7 C) (Oral)   Resp 18   Ht 4' 11 (1.499 m)   Wt 71.6 kg   SpO2 98%   BMI 31.88 kg/m  Pain Scale: 0-10 POSS *See Group Information*: 1-Acceptable,Awake and alert Pain Score: 0-No pain   SpO2: SpO2: 98 % O2 Device:SpO2: 98 % O2 Flow Rate: .O2 Flow Rate (L/min): 2 L/min  IO: Intake/output summary:  Intake/Output Summary (Last 24 hours) at 11/13/2023 1305 Last data filed at 11/13/2023 1041 Gross per 24 hour  Intake 741.41 ml  Output 700 ml  Net 41.41 ml    LBM: Last BM Date : 11/12/23 Baseline Weight: Weight: 63 kg Most recent weight: Weight: 71.6 kg     Palliative Assessment/Data:   PPS 40%  Time In:  12 Time Out:  1315 Time Total:  75 Greater than 50%  of this time was spent counseling and coordinating care related to the above assessment and plan.  Signed by: Lonia Serve, MD   Please contact Palliative Medicine Team phone at [phone] for questions and concerns.  For individual provider: See Tracey

## 2023-11-13 NOTE — IPAL (Signed)
  Interdisciplinary Goals of Care Family Meeting   Date carried out: 11/13/2023  Location of the meeting: Conference room  Member's involved: Physician and Family Member or next of kin  Durable Power of Attorney or acting medical decision maker: Son Oneil and daughter Rojelio, son-in-law Alm Hint.  Discussion: We discussed goals of care for [facility] .  Patient again became somnolent with CO2 retention and hypercapnic.  We discussed about patient's recurrent hypercapnia, no meaningful recovery and poor chances of rehab.  Discussed about patient's overall health, declining medical condition for some time now.  Even if patient can go to rehab, this will be recurrent event and may become uncomfortable when not in the medical setting.  After a careful discussion, we came to conclusion that she will be best served with palliation and hospice.  Will start patient on comfort care and hospice pathway, monitor her response for another 24 to 48 hours.  Will keep on BiPAP for now for comfort and gradually wean off.  Patient may pass away in the hospital once taken off BiPAP.  Currently not recommending transfer to inpatient hospice today.  Will reevaluate her clinical situation tomorrow morning.  Code status:   Code Status: Do not attempt resuscitation (DNR) - Comfort care   Disposition: In-patient comfort care  Time spent for the meeting: 55 minutes  Provide all comfort care measures.  Pain medications, opiate pathway for pain and discomfort and air hunger. Wean off to oxygen nasal cannula for comfort. RN to pronounce death if happens in the hospital. Restricted visitation policy.    Renato Applebaum, MD  11/13/2023, 3:38 PM

## 2023-11-13 NOTE — TOC Progression Note (Signed)
 Transition of Care [facility]) - Progression Note    Patient Details  Name: Anna Adams MRN: [id number] Date of Birth: Apr 26, 1934  Transition of Care [facility]) CM/SW Contact  Sonda Manuella Quill, RN Phone Number: 11/13/2023, 6:23 PM  Clinical Narrative:    Pt transitioned to comfort care; following.   Expected Discharge Plan: Skilled Nursing Facility Barriers to Discharge: Continued Medical Work up               Expected Discharge Plan and Services In-house Referral: NA Discharge Planning Services: NA Post Acute Care Choice: Skilled Nursing Facility Living arrangements for the past 2 months: Single Family Home                 DME Arranged: N/A DME Agency: NA       HH Arranged: NA HH Agency: NA         Social Drivers of Health (SDOH) Interventions SDOH Screenings   Food Insecurity: No Food Insecurity (11/05/2023)  Housing: Low Risk  (11/07/2023)  Transportation Needs: No Transportation Needs (11/07/2023)  Utilities: Not At Risk (11/07/2023)  Alcohol Screen: Low Risk  (05/25/2023)  Depression (PHQ2-9): Medium Risk (05/03/2023)  Financial Resource Strain: Low Risk  (05/25/2023)  Physical Activity: Inactive (05/25/2023)  Social Connections: Socially Isolated (11/07/2023)  Stress: No Stress Concern Present (05/25/2023)  Tobacco Use: Medium Risk (11/04/2023)  Health Literacy: Adequate Health Literacy (05/25/2023)    Readmission Risk Interventions    11/07/2023   12:30 PM  Readmission Risk Prevention Plan  Post Dischage Appt Complete  Medication Screening Complete  Transportation Screening Complete

## 2023-11-14 DIAGNOSIS — I951 Orthostatic hypotension: Secondary | ICD-10-CM | POA: Diagnosis not present

## 2023-11-14 NOTE — Progress Notes (Signed)
 PROGRESS NOTE    Anna Adams  FMW:[id number] DOB: [birth year] DOA: 11/04/2023 PCP: Geofm Glade PARAS, MD    Brief Narrative:  PMH of HTN, presented to the hospital secondary to fall. She was found down by her caretaker.  Patient was lying on her left side.  She was last seen normal night before and had dinner.  She was on the ground for unknown amount of time. patient could not recall if she tripped and fell or passed out. Son reported that on 9/29 he had brought her to the ED because she was dizzy and could not sit up in the bed.treated for UTI. During the hospital course became more lethargic and was found to have acute respiratory failure with hypercarbia and hypoxia requiring BiPAP therapy and has her blood pressure dropped requiring pressor therapy and was transferred to the ICU.  Patient was transferred out of ICU however remains in poor clinical status.  Lethargic, unable to participate in therapies and recurrent hypercarbia and hypoglycemia with poor oral intake. 10/19, family meeting held-goal of care changed to comfort care and hospice pathway.  Subjective: Patient seen and examined.  She looks comfortable.  Did not respond to gentle stimuli.  Son was at the bedside.  Patient took her thyroid  medications in the morning.  Declined to eat.  Mumbled few words to the family. Discussed with patient's son and they are agreeable to send a referral to inpatient hospice, preferably can place.  Assessment & Plan:   Acute respiratory failure with hypercapnia Acute metabolic encephalopathy Failure to thrive and recurrent hypoglycemia  Plan: Patient did not respond well despite optimal treatment. Served with comfort care and hospice 10/19. All comfort care medications are available.  Providing end-of-life care. Today, patient is stable to transfer to inpatient hospice if any bed available.  If unable to go today, will need to assess clinical stability at the time of transfer.      DVT  prophylaxis: Place TED hose Start: 11/09/23 1130   Code Status: Comfort care Family Communication: Son at the bedside Disposition Plan: Status is: Inpatient Remains inpatient appropriate because: Providing end-of-life care     Consultants:  Palliative care  Procedures:  None  Antimicrobials:  Completed     Objective: Vitals:   11/13/23 1309 11/13/23 1431 11/13/23 1545 11/14/23 0617  BP: 124/68   (!) 117/53  Pulse: 71 75  72  Resp: 18 (!) 24 20 (!) 22  Temp: 98.6 F (37 C)   98 F (36.7 C)  TempSrc: Oral   Axillary  SpO2: 100% 96% 96% 99%  Weight:      Height:        Intake/Output Summary (Last 24 hours) at 11/14/2023 1027 Last data filed at 11/14/2023 0726 Gross per 24 hour  Intake --  Output 425 ml  Net -425 ml   Filed Weights   11/11/23 0500 11/12/23 0430 11/13/23 0523  Weight: 70.6 kg 71.5 kg 71.6 kg    Examination:  General exam: Chronically sick looking.  Patient is sleepy, unable to arouse with gentle stimuli. Respiratory system: Looks fairly comfortable breathing normally on 3 L of oxygen.  Some conducted upper airway sounds. Cardiovascular system: S1 & S2 heard, RRR. Extremities: Gross generalized weakness.  She has anasarca and ecchymosis.    Data Reviewed: I have personally reviewed following labs and imaging studies  CBC: Recent Labs  Lab 11/09/23 0334 11/10/23 0814 11/11/23 0340 11/12/23 0308 11/13/23 0542  WBC 7.7 7.0 6.8 7.8 9.1  HGB  9.4* 9.9* 9.8* 11.0* 11.0*  HCT 31.8* 32.6* 33.0* 37.7 36.4  MCV 98.5 96.7 97.6 99.5 96.3  PLT 203 237 227 242 263   Basic Metabolic Panel: Recent Labs  Lab 11/08/23 0558 11/08/23 1341 11/09/23 0334 11/10/23 0814 11/11/23 0340 11/12/23 0308 11/13/23 0542  NA 139 141 141 139 137 137 138  K 4.8 4.7 4.6 4.2 4.1 4.1 4.4  CL 102 104 104 103 104 104 104  CO2 31 30 27 27 27 24 30   GLUCOSE 110* 106* 82 74 108* 110* 96  BUN 15 17 26* 29* 25* 19 13  CREATININE 1.01* 1.31* 1.71* 1.54* 0.97 0.85  0.59  CALCIUM  10.7* 10.5* 10.7* 11.0* 10.4* 11.1* 11.3*  MG  --  2.5*  --  2.6* 2.2 2.3 2.4  PHOS 3.7  --   --   --  2.9 2.0* 2.1*   GFR: Estimated Creatinine Clearance: 42.7 mL/min (by C-G formula based on SCr of 0.59 mg/dL). Liver Function Tests: Recent Labs  Lab 11/08/23 0558 11/09/23 0334 11/11/23 0340 11/12/23 0308 11/13/23 0542  AST  --  67*  --   --   --   ALT  --  40  --   --   --   ALKPHOS  --  79  --   --   --   BILITOT  --  0.5  --   --   --   PROT  --  5.9*  --   --   --   ALBUMIN 3.7 3.4* 3.2* 3.4* 3.4*   No results for input(s): LIPASE, AMYLASE in the last 168 hours. Recent Labs  Lab 11/08/23 1015  AMMONIA 33   Coagulation Profile: No results for input(s): INR, PROTIME in the last 168 hours. Cardiac Enzymes: No results for input(s): CKTOTAL, CKMB, CKMBINDEX, TROPONINI in the last 168 hours. BNP (last 3 results) Recent Labs    11/08/23 1104  PROBNP 485.0*   HbA1C: No results for input(s): HGBA1C in the last 72 hours. CBG: Recent Labs  Lab 11/12/23 2352 11/13/23 0350 11/13/23 0731 11/13/23 1111 11/13/23 1624  GLUCAP 99 82 76 71 66*   Lipid Profile: No results for input(s): CHOL, HDL, LDLCALC, TRIG, CHOLHDL, LDLDIRECT in the last 72 hours. Thyroid  Function Tests: No results for input(s): TSH, T4TOTAL, FREET4, T3FREE, THYROIDAB in the last 72 hours. Anemia Panel: No results for input(s): VITAMINB12, FOLATE, FERRITIN, TIBC, IRON, RETICCTPCT in the last 72 hours. Sepsis Labs: Recent Labs  Lab 11/08/23 1330 11/08/23 1341  PROCALCITON  --  0.11  LATICACIDVEN 0.7  --     Recent Results (from the past 240 hours)  Urine Culture     Status: Abnormal   Collection Time: 11/04/23  4:36 PM   Specimen: Urine, Clean Catch  Result Value Ref Range Status   Specimen Description   Final    URINE, CLEAN CATCH Performed at [facility], [address] [place], [place] [address]     Special Requests   Final    NONE Performed at [facility], [address], [place], [place] [address]    Culture (A)  Final    10,000 COLONIES/mL ESCHERICHIA COLI 70,000 COLONIES/mL PSEUDOMONAS AERUGINOSA    Report Status 11/08/2023 FINAL  Final   Organism ID, Bacteria PSEUDOMONAS AERUGINOSA (A)  Final   Organism ID, Bacteria ESCHERICHIA COLI (A)  Final      Susceptibility   Escherichia coli - MIC*    AMPICILLIN <=2 SENSITIVE Sensitive  CEFAZOLIN (URINE) Value in next row Sensitive      2 SENSITIVEThis is a modified FDA-approved test that has been validated and its performance characteristics determined by the reporting laboratory.  This laboratory is certified under the Clinical Laboratory Improvement Amendments CLIA as qualified to perform high complexity clinical laboratory testing.    CEFEPIME Value in next row Sensitive      2 SENSITIVEThis is a modified FDA-approved test that has been validated and its performance characteristics determined by the reporting laboratory.  This laboratory is certified under the Clinical Laboratory Improvement Amendments CLIA as qualified to perform high complexity clinical laboratory testing.    ERTAPENEM Value in next row Sensitive      2 SENSITIVEThis is a modified FDA-approved test that has been validated and its performance characteristics determined by the reporting laboratory.  This laboratory is certified under the Clinical Laboratory Improvement Amendments CLIA as qualified to perform high complexity clinical laboratory testing.    CEFTRIAXONE Value in next row Sensitive      2 SENSITIVEThis is a modified FDA-approved test that has been validated and its performance characteristics determined by the reporting laboratory.  This laboratory is certified under the Clinical Laboratory Improvement Amendments CLIA as qualified to perform high complexity clinical laboratory testing.    CIPROFLOXACIN  Value in next row Sensitive      2  SENSITIVEThis is a modified FDA-approved test that has been validated and its performance characteristics determined by the reporting laboratory.  This laboratory is certified under the Clinical Laboratory Improvement Amendments CLIA as qualified to perform high complexity clinical laboratory testing.    GENTAMICIN Value in next row Sensitive      2 SENSITIVEThis is a modified FDA-approved test that has been validated and its performance characteristics determined by the reporting laboratory.  This laboratory is certified under the Clinical Laboratory Improvement Amendments CLIA as qualified to perform high complexity clinical laboratory testing.    NITROFURANTOIN  Value in next row Sensitive      2 SENSITIVEThis is a modified FDA-approved test that has been validated and its performance characteristics determined by the reporting laboratory.  This laboratory is certified under the Clinical Laboratory Improvement Amendments CLIA as qualified to perform high complexity clinical laboratory testing.    TRIMETH /SULFA  Value in next row Sensitive      2 SENSITIVEThis is a modified FDA-approved test that has been validated and its performance characteristics determined by the reporting laboratory.  This laboratory is certified under the Clinical Laboratory Improvement Amendments CLIA as qualified to perform high complexity clinical laboratory testing.    AMPICILLIN/SULBACTAM Value in next row Sensitive      2 SENSITIVEThis is a modified FDA-approved test that has been validated and its performance characteristics determined by the reporting laboratory.  This laboratory is certified under the Clinical Laboratory Improvement Amendments CLIA as qualified to perform high complexity clinical laboratory testing.    PIP/TAZO Value in next row Sensitive      <=4 SENSITIVEThis is a modified FDA-approved test that has been validated and its performance characteristics determined by the reporting laboratory.  This laboratory  is certified under the Clinical Laboratory Improvement Amendments CLIA as qualified to perform high complexity clinical laboratory testing.    MEROPENEM Value in next row Sensitive      <=4 SENSITIVEThis is a modified FDA-approved test that has been validated and its performance characteristics determined by the reporting laboratory.  This laboratory is certified under the Clinical Laboratory Improvement Amendments CLIA as qualified to perform  high complexity clinical laboratory testing.    * 10,000 COLONIES/mL ESCHERICHIA COLI   Pseudomonas aeruginosa - MIC*    MEROPENEM Value in next row Sensitive      <=4 SENSITIVEThis is a modified FDA-approved test that has been validated and its performance characteristics determined by the reporting laboratory.  This laboratory is certified under the Clinical Laboratory Improvement Amendments CLIA as qualified to perform high complexity clinical laboratory testing.    CIPROFLOXACIN  Value in next row Sensitive      <=4 SENSITIVEThis is a modified FDA-approved test that has been validated and its performance characteristics determined by the reporting laboratory.  This laboratory is certified under the Clinical Laboratory Improvement Amendments CLIA as qualified to perform high complexity clinical laboratory testing.    PIP/TAZO Value in next row Sensitive      <=4 SENSITIVEThis is a modified FDA-approved test that has been validated and its performance characteristics determined by the reporting laboratory.  This laboratory is certified under the Clinical Laboratory Improvement Amendments CLIA as qualified to perform high complexity clinical laboratory testing.    CEFEPIME Value in next row Sensitive      <=4 SENSITIVEThis is a modified FDA-approved test that has been validated and its performance characteristics determined by the reporting laboratory.  This laboratory is certified under the Clinical Laboratory Improvement Amendments CLIA as qualified to perform  high complexity clinical laboratory testing.    CEFTAZIDIME Value in next row Sensitive      <=4 SENSITIVEThis is a modified FDA-approved test that has been validated and its performance characteristics determined by the reporting laboratory.  This laboratory is certified under the Clinical Laboratory Improvement Amendments CLIA as qualified to perform high complexity clinical laboratory testing.    * 70,000 COLONIES/mL PSEUDOMONAS AERUGINOSA  MRSA Next Gen by PCR, Nasal     Status: None   Collection Time: 11/08/23 10:38 AM   Specimen: Nasal Mucosa; Nasal Swab  Result Value Ref Range Status   MRSA by PCR Next Gen NOT DETECTED NOT DETECTED Final    Comment: (NOTE) The GeneXpert MRSA Assay (FDA approved for NASAL specimens only), is one component of a comprehensive MRSA colonization surveillance program. It is not intended to diagnose MRSA infection nor to guide or monitor treatment for MRSA infections. Test performance is not FDA approved in patients less than 31 years old. Performed at [facility], [address], [place], [place] [address]          Radiology Studies: No results found.      Scheduled Meds:  lactose free nutrition  237 mL Oral TID BM   latanoprost  1 drop Both Eyes QHS   levothyroxine   125 mcg Oral Q0600   lidocaine   2 patch Transdermal Q24H   megestrol  400 mg Oral Daily   Continuous Infusions:   LOS: 10 days    Time spent: 55 minutes    Renato Applebaum, MD [facility]

## 2023-11-14 NOTE — Progress Notes (Signed)
 PMT brief note  Patient resting in bed, seen earlier this am, chaplain also supporting the family Now on comfort measures Also under consideration for pursuing residential hospice, [facility] note reviewed, allowing for some time and space for reflection for son Oneil with regards to this decision.  Medication history noted.  Chart reviewed, IPAL note from [facility] MD noted.  BP (!) 117/53 (BP Location: Left Arm)   Pulse 72   Temp 98 F (36.7 C) (Axillary)   Resp (!) 22   Ht 4' 11 (1.499 m)   Wt 71.6 kg   SpO2 99%   BMI 31.88 kg/m  No distress Plan: Continue current mode of care DNR DNI comfort measures Under consideration for transfer to residential hospice.  Low MDM Lonia Serve MD [facility] palliative.

## 2023-11-14 NOTE — TOC Progression Note (Signed)
 Transition of Care [facility]) - Progression Note    Patient Details  Name: Anna Adams MRN: [id number] Date of Birth: 01/09/1935  Transition of Care [facility]) CM/SW Contact  Solita Macadam, Nathanel, RN Phone Number: 11/14/2023, 10:39 AM  Clinical Narrative: spoke to [facility]) wants another day to discuss the wishes(residential hospice vs in hospital comfort care to transfer or stay in the hospital. MD updated.     Expected Discharge Plan:  (TBD) Barriers to Discharge: Continued Medical Work up               Expected Discharge Plan and Services In-house Referral: NA Discharge Planning Services: NA Post Acute Care Choice: Hospice Living arrangements for the past 2 months: Single Family Home                 DME Arranged: N/A DME Agency: NA       HH Arranged: NA HH Agency: NA         Social Drivers of Health (SDOH) Interventions SDOH Screenings   Food Insecurity: No Food Insecurity (11/05/2023)  Housing: Low Risk  (11/07/2023)  Transportation Needs: No Transportation Needs (11/07/2023)  Utilities: Not At Risk (11/07/2023)  Alcohol Screen: Low Risk  (05/25/2023)  Depression (PHQ2-9): Medium Risk (05/03/2023)  Financial Resource Strain: Low Risk  (05/25/2023)  Physical Activity: Inactive (05/25/2023)  Social Connections: Socially Isolated (11/07/2023)  Stress: No Stress Concern Present (05/25/2023)  Tobacco Use: Medium Risk (11/04/2023)  Health Literacy: Adequate Health Literacy (05/25/2023)    Readmission Risk Interventions    11/07/2023   12:30 PM  Readmission Risk Prevention Plan  Post Dischage Appt Complete  Medication Screening Complete  Transportation Screening Complete

## 2023-11-14 NOTE — Plan of Care (Signed)
  Problem: Education: Goal: Knowledge of condition and prescribed therapy will improve Outcome: Not Progressing   Problem: Cardiac: Goal: Will achieve and/or maintain adequate cardiac output Outcome: Not Progressing   Problem: Physical Regulation: Goal: Complications related to the disease process, condition or treatment will be avoided or minimized Outcome: Not Progressing   Problem: Education: Goal: Knowledge of General Education information will improve Description: Including pain rating scale, medication(s)/side effects and non-pharmacologic comfort measures Outcome: Not Progressing   Problem: Health Behavior/Discharge Planning: Goal: Ability to manage health-related needs will improve Outcome: Not Progressing   Problem: Clinical Measurements: Goal: Ability to maintain clinical measurements within normal limits will improve Outcome: Not Progressing Goal: Will remain free from infection Outcome: Not Progressing Goal: Diagnostic test results will improve Outcome: Not Progressing Goal: Respiratory complications will improve Outcome: Not Progressing Goal: Cardiovascular complication will be avoided Outcome: Not Progressing   Problem: Activity: Goal: Risk for activity intolerance will decrease Outcome: Not Progressing   Problem: Nutrition: Goal: Adequate nutrition will be maintained Outcome: Not Progressing   Problem: Coping: Goal: Level of anxiety will decrease Outcome: Not Progressing   Problem: Elimination: Goal: Will not experience complications related to bowel motility Outcome: Not Progressing Goal: Will not experience complications related to urinary retention Outcome: Not Progressing   Problem: Pain Managment: Goal: General experience of comfort will improve and/or be controlled Outcome: Not Progressing   Problem: Safety: Goal: Ability to remain free from injury will improve Outcome: Not Progressing   Problem: Skin Integrity: Goal: Risk for impaired  skin integrity will decrease Outcome: Not Progressing   Problem: Education: Goal: Knowledge of the prescribed therapeutic regimen will improve Outcome: Not Progressing   Problem: Coping: Goal: Ability to identify and develop effective coping behavior will improve Outcome: Not Progressing   Problem: Clinical Measurements: Goal: Quality of life will improve Outcome: Not Progressing   Problem: Respiratory: Goal: Verbalizations of increased ease of respirations will increase Outcome: Not Progressing   Problem: Role Relationship: Goal: Family's ability to cope with current situation will improve Outcome: Not Progressing Goal: Ability to verbalize concerns, feelings, and thoughts to partner or family member will improve Outcome: Not Progressing   Problem: Pain Management: Goal: Satisfaction with pain management regimen will improve Outcome: Not Progressing

## 2023-11-14 NOTE — Progress Notes (Addendum)
   11/14/23 1016  Spiritual Encounters  Type of Visit Initial  Care provided to: Pt and family  Referral source Physician  Reason for visit Urgent spiritual support   I responded to a spiritual care consult request by Dr. Raenelle on Anna Adams & family behalf after decision to move to comfort care.  Anna Adams was restful and in no distress, drowsy. I engaged with her daughter, Anna Adams and son in law, Anna Adams, to offer support. Anna Adams debriefed further outside room to share stressors of past week and moving between [place] and home in [place]. Anna Adams also debriefed around an injury he suffered two years ago which helps him to be resilient and practice self care, and this helps him be a grounding presence for Anna Adams.  I offered compassionate, non-anxious presence and active listening. I assessed needs and encouraged self care.  I encouraged possibly playing music for [facility]. A friend was arriving as I left. I let them know how to reach a chaplain throught the care team as needed.  Anna Adams L. Delores HERO.Div

## 2023-11-15 ENCOUNTER — Other Ambulatory Visit: Payer: Self-pay | Admitting: Internal Medicine

## 2023-11-15 DIAGNOSIS — I951 Orthostatic hypotension: Secondary | ICD-10-CM | POA: Diagnosis not present

## 2023-11-15 MED ORDER — ORAL CARE MOUTH RINSE: 15.0000 mL | OROMUCOSAL | Status: DC | PRN

## 2023-11-15 MED ORDER — GLYCOPYRROLATE 0.2 MG/ML IJ SOLN
0.1000 mg | Freq: Three times a day (TID) | INTRAMUSCULAR | Status: DC
  Administered 2023-11-15 (×2): 0.1 mg via INTRAVENOUS
  Filled 2023-11-15 (×3): qty 0.5

## 2023-11-15 MED ORDER — ORAL CARE MOUTH RINSE: 15.0000 mL | OROMUCOSAL | Status: DC

## 2023-11-15 MED ORDER — SCOPOLAMINE 1 MG/3DAYS TD PT72
1.0000 | MEDICATED_PATCH | TRANSDERMAL | Status: DC
  Administered 2023-11-15: 1 mg via TRANSDERMAL
  Filled 2023-11-15: qty 1

## 2023-11-16 DIAGNOSIS — J9601 Acute respiratory failure with hypoxia: Secondary | ICD-10-CM | POA: Diagnosis present

## 2023-11-26 NOTE — Death Summary Note (Signed)
 DEATH SUMMARY   Patient Details  Name: Anna Adams MRN: [id number] DOB: 1934/04/12 ERE:Almwd, Glade PARAS, MD Admission/Discharge Information   Admit Date:  2023/11/22  Date of Death: Date of Death: 03-Dec-2023  Time of Death: Time of Death: 2026-12-09  Length of Stay: 11-23-2023   Principle Cause of death: Acute respiratory failure with hypoxia and hypercarbia  Hospital Diagnoses: Principal Problem:   Acute respiratory failure with hypoxia and hypercarbia (HCC) Active Problems:   GERD (gastroesophageal reflux disease)   Fall at home, initial encounter   Stage 3b chronic kidney disease (HCC)   Orthostatic hypotension   Syncope and collapse   UTI (urinary tract infection)  Hospital Course: PMH of HTN, presented to the hospital secondary to fall. She was found down by her caretaker.  Patient was lying on her left side.  She was last seen normal night before and had dinner.  She was on the ground for unknown amount of time. patient could not recall if she tripped and fell or passed out. Son reported that on 9/29 he had brought her to the ED because she was dizzy and could not sit up in the bed.treated for UTI. During the hospital course becomes more lethargic and was found to have acute respiratory failure with hypercarbia and hypoxia requiring BiPAP therapy and has her blood pressure dropped requiring pressor therapy and was transferred to the ICU. Patient initially improved with BiPAP therapy but became lethargic again with hypotension again. At which time family decided transition to complete comfort.  Assessment and Plan: Acute respiratory failure with hypercarbia,  acute metabolic encephalopathy pH of 7.2, pCO2 83.  After BiPAP pH improved pCO2 improved to 75. Most likely undiagnosed sleep apnea. Patient was with the ICU service. Recommendation is to continue nightly BiPAP Patient unable to tolerate BiPAP or CPAP both. Mentation progressively worsening after not using CPAP. Currently was  on comfort care.  Recurrent fall. Dizziness. Left-sided weakness. Likely multifactorial. But patient does have left-sided weakness and facial droop. MRI brain negative for any acute stroke.   Orthostatic hypotension, dizziness Patient was aggressively diuresed. For now blood pressure does not drop significantly after changing position. Will monitor. B12 465, TSH 1.8 A.m. cortisol level normal 2D echo showed EF 70 to 75%, no acute valve stenosis   Pseudomonas urinary tract infection Went to see PCP on 10/12. Urine culture was performed which grew Pseudomonas. Patient treated for 5 days with IV antibiotics.   SVT.  Left arm. Left arm swelling Venous Doppler showed no DVT, age-indeterminate focal superficial vein thrombosis involving the left cephalic vein at the AC/site of venipuncture.  Patient had an IV line there, removed.   Hypertension Blood pressure was soft.  Briefly patient was hypotensive requiring pressors as well.   Hyperlipidemia Was on statin.  AKI. Hypermagnesemia. Hypercalcemia. CKD 3B ruled out. Baseline renal function appears to be normal 0.8. Creatinine trended up to 1.7. After diuresis. Hypercalcemia has been treated with IV fluids   Hypoglycemia from Poor p.o. intake. Adult failure to thrive. Currently receiving dextrose infusion.  Will complete infusion today and monitor CBG. Patient reports poor p.o. intake.  Remains hypoglycemic. Megace was given but no benefit.  Prognosis is poor  Hypothyroidism. Was on Synthroid . TSH normal.  Goals of care conversation. Palliative care discussed with the patient and the family. Currently DNR/DNI. Condition deteriorated rapidly over the last few days and currently comfort care only. Family is desirous to continue care here in the hospital. At present patient is requiring  scheduled scopolamine patch as well as Robinul. Will monitor for progression.  Normocytic anemia. H&H relatively stable. Appears to  be chronic in nature. Hemoglobin drop from 11-10 likely dilutional in nature.  Obesity Class 1 Body mass index is 31.88 kg/m.  Placing the pt at higher risk of poor outcomes.  Procedures: Echocardiogram   Consultations: PCCM  Palliative care   The results of significant diagnostics from this hospitalization (including imaging, microbiology, ancillary and laboratory) are listed below for reference.   Significant Diagnostic Studies: MR BRAIN WO CONTRAST Result Date: 11/10/2023 EXAM: MRI BRAIN WITHOUT CONTRAST 11/10/2023 04:34:28 PM TECHNIQUE: Multiplanar multisequence MRI of the head/brain was performed without the administration of intravenous contrast. COMPARISON: CT head 11/08/2023. CLINICAL HISTORY: Neuro deficit, acute, stroke suspected. FINDINGS: BRAIN AND VENTRICLES: Scattered white matter signal abnormality suggestive of mild chronic microvascular ischemic changes. Remote lacunar infarct in the right basal ganglia. Mild parenchymal volume loss. No acute infarct. No intracranial hemorrhage. No mass. No midline shift. No hydrocephalus. The sella is unremarkable. Normal flow voids. ORBITS: No acute abnormality. SINUSES AND MASTOIDS: Mild mucosal thickening in the left posterior ethmoid air cells. Additional mild mucosal thickening in the right sphenoid sinus. Small bilateral mastoid effusions. BONES AND SOFT TISSUES: Normal marrow signal. No acute soft tissue abnormality. IMPRESSION: 1. No acute intracranial abnormality. 2. Remote lacunar infarct in the right basal ganglia. 3. Mild chronic microvascular ischemic changes and mild parenchymal volume loss. Electronically signed by: Donnice Mania MD 11/10/2023 05:47 PM EDT RP Workstation: [id number]   DG CHEST PORT 1 VIEW Result Date: 11/08/2023 EXAM: 1 VIEW(S) XRAY OF THE CHEST 11/08/2023 10:50:00 AM COMPARISON: 11/04/2023 CLINICAL HISTORY: Dyspnea FINDINGS: LUNGS AND PLEURA: Low lung volumes. Left basilar opacity, unchanged. Left diaphragmatic  hernia containing stomach and bowel. No pulmonary edema. No pleural effusion. No pneumothorax. HEART AND MEDIASTINUM: Cardiomegaly, unchanged. BONES AND SOFT TISSUES: No acute osseous abnormality. IMPRESSION: 1. Left diaphragmatic hernia containing stomach and bowel. 2. Cardiomegaly, unchanged. 3. Left basilar opacity, unchanged. Electronically signed by: Evalene Coho MD 11/08/2023 11:37 AM EDT RP Workstation: [id number]   CT HEAD WO CONTRAST ( ) Result Date: 11/08/2023 EXAM: CT HEAD WITHOUT CONTRAST 11/08/2023 09:40:50 AM TECHNIQUE: CT of the head was performed without the administration of intravenous contrast. Automated exposure control, iterative reconstruction, and/or weight based adjustment of the mA/kV was utilized to reduce the radiation dose to as low as reasonably achievable. COMPARISON: CT head 11/04/2023. CLINICAL HISTORY: Mental status change, unknown cause. Mental status change, cause unknown; fall. Mental status change, unknown cause. Mental status change, cause unknown; fall. FINDINGS: BRAIN AND VENTRICLES: No acute hemorrhage. No evidence of acute infarct. Mild parenchymal volume loss. Nonspecific hypoattenuation in the periventricular and subcortical white matter, most likely representing chronic microvascular ischemic changes. Small remote lacunar infarct in the right basal ganglia. No hydrocephalus. No extra-axial collection. No mass effect or midline shift. ORBITS: No acute abnormality. SINUSES: Small right mastoid effusion. Mild mucosal thickening in the ethmoid sinuses. SOFT TISSUES AND SKULL: No acute soft tissue abnormality. No skull fracture. Atherosclerosis of the carotid siphons. IMPRESSION: 1. No acute intracranial abnormality. 2. Decreased scalp soft tissue swelling. 3. Mild parenchymal volume loss and chronic microvascular ischemic changes. 4. Small remote lacunar infarct in the right basal ganglia. Electronically signed by: Donnice Mania MD 11/08/2023 09:59 AM EDT RP  Workstation: [id number]   VAS US  UPPER EXTREMITY VENOUS DUPLEX Result Date: 11/07/2023 UPPER VENOUS STUDY  Patient Name:  Anna Adams  Date of Exam:   11/07/2023 Medical Rec #: [id number]  Accession #:    [id number] Date of Birth: Nov 10, 1934     Patient Gender: F Patient Age:   6 years Exam Location:  [facility] Procedure:      VAS US  UPPER EXTREMITY VENOUS DUPLEX Referring Phys: RIPUDEEP RAI --------------------------------------------------------------------------------  Indications: Swelling Other Indications: Status post fall with unknown downtime. Limitations: Poor ultrasound/tissue interface, body habitus and Patient unable to keep arm turned. Comparison Study: No prior study on file Performing Technologist: Alberta Lis RVS  Examination Guidelines: A complete evaluation includes B-mode imaging, spectral Doppler, color Doppler, and power Doppler as needed of all accessible portions of each vessel. Bilateral testing is considered an integral part of a complete examination. Limited examinations for reoccurring indications may be performed as noted.  Right Findings: +----------+------------+---------+-----------+----------+-------+ RIGHT     CompressiblePhasicitySpontaneousPropertiesSummary +----------+------------+---------+-----------+----------+-------+ Subclavian               Yes       Yes                      +----------+------------+---------+-----------+----------+-------+  Left Findings: +----------+------------+---------+-----------+----------+---------------------+ LEFT      CompressiblePhasicitySpontaneousProperties       Summary        +----------+------------+---------+-----------+----------+---------------------+ IJV           Full       Yes       Yes                                    +----------+------------+---------+-----------+----------+---------------------+ Subclavian               Yes       Yes                                     +----------+------------+---------+-----------+----------+---------------------+ Axillary                 Yes       Yes                                    +----------+------------+---------+-----------+----------+---------------------+ Brachial      Full       Yes       Yes                                    +----------+------------+---------+-----------+----------+---------------------+ Radial        Full                                                        +----------+------------+---------+-----------+----------+---------------------+ Ulnar         Full                                                        +----------+------------+---------+-----------+----------+---------------------+ Cephalic    Partial  Focal, age                                                           indeterminate, at [facility]  +----------+------------+---------+-----------+----------+---------------------+ Basilic       Full                                                        +----------+------------+---------+-----------+----------+---------------------+  Summary:  Right: No evidence of superficial vein thrombosis in the upper extremity.  Left: No evidence of deep vein thrombosis in the upper extremity. Findings consistent with age indeterminate, focal superficial vein thrombosis involving the left cephalic vein at the AC/site of venepuncture.. Subcutaneous edema noted throughout the left upper extremity.  *See table(s) above for measurements and observations.  Diagnosing physician: Lonni Gaskins MD Electronically signed by Lonni Gaskins MD on 11/07/2023 at 7:19:14 PM.    Final    ECHOCARDIOGRAM COMPLETE Result Date: 11/05/2023    ECHOCARDIOGRAM REPORT   Patient Name:   Anna Adams Date of Exam: 11/05/2023 Medical Rec #:  [id number]    Height:       59.0 in Accession #:    [id number]   Weight:       139.0 lb Date of Birth:  [birth year]    BSA:           1.580 m Patient Age:    89 years     BP:           134/84 mmHg Patient Gender: F            HR:           72 bpm. Exam Location:  Inpatient Procedure: 2D Echo, Cardiac Doppler and Color Doppler (Both Spectral and Color            Flow Doppler were utilized during procedure). Indications:    Syncope R55  History:        Patient has prior history of Echocardiogram examinations, most                 recent 06/01/2021.  Sonographer:    Tinnie Gosling RDCS Referring Phys: [phone] THERESA C SHEEHAN  Sonographer Comments: Technically difficult study due to poor echo windows. IMPRESSIONS  1. Left ventricular ejection fraction, by estimation, is 70 to 75%. The left ventricle has hyperdynamic function. Left ventricular endocardial border not optimally defined to evaluate regional wall motion. Left ventricular diastolic parameters are indeterminate. Elevated left atrial pressure.  2. Right ventricular systolic function was not well visualized. The right ventricular size is not well visualized. Tricuspid regurgitation signal is inadequate for assessing PA pressure.  3. The mitral valve is degenerative. Trivial mitral valve regurgitation.  4. The aortic valve was not well visualized. Aortic valve regurgitation is not visualized.  5. The inferior vena cava is normal in size with greater than 50% respiratory variability, suggesting right atrial pressure of 3 mmHg. Comparison(s): Extremely limited images. LVEF approximately 70-75%. FINDINGS  Left Ventricle: Left ventricular ejection fraction, by estimation, is 70 to 75%. The left ventricle has hyperdynamic function. Left ventricular endocardial border not optimally defined to evaluate regional wall motion. The left  ventricular internal cavity size was normal in size. There is no left ventricular hypertrophy. Left ventricular diastolic parameters are indeterminate. Elevated left atrial pressure. Right Ventricle: The right ventricular size is not well visualized. Right  vetricular wall thickness was not well visualized. Right ventricular systolic function was not well visualized. Tricuspid regurgitation signal is inadequate for assessing PA pressure. Left Atrium: Left atrial size was normal in size. Right Atrium: Right atrial size was normal in size. Pericardium: There is no evidence of pericardial effusion. Presence of epicardial fat layer. Mitral Valve: The mitral valve is degenerative in appearance. Trivial mitral valve regurgitation. Tricuspid Valve: The tricuspid valve is not well visualized. Tricuspid valve regurgitation is trivial. Aortic Valve: The aortic valve was not well visualized. Aortic valve regurgitation is not visualized. Pulmonic Valve: The pulmonic valve was not well visualized. Pulmonic valve regurgitation is not visualized. Aorta: The aortic root was not well visualized. Venous: The inferior vena cava is normal in size with greater than 50% respiratory variability, suggesting right atrial pressure of 3 mmHg. IAS/Shunts: The interatrial septum was not well visualized. Additional Comments: 3D was performed not requiring image post processing on an independent workstation and was indeterminate.  LEFT VENTRICLE PLAX 2D LVIDd:         3.20 cm Diastology LVIDs:         1.90 cm LV e' medial:    4.03 cm/s LV PW:         0.80 cm LV E/e' medial:  15.6 LV IVS:        0.70 cm LV e' lateral:   4.24 cm/s                        LV E/e' lateral: 14.9  IVC IVC diam: 1.40 cm AORTIC VALVE LVOT Vmax:   98.80 cm/s LVOT Vmean:  66.400 cm/s LVOT VTI:    0.201 m MITRAL VALVE MV Area (PHT): 2.85 cm    SHUNTS MV Decel Time: 266 msec    Systemic VTI: 0.20 m MV E velocity: 63.00 cm/s MV A velocity: 78.00 cm/s MV E/A ratio:  0.81 Jayson Sierras MD Electronically signed by Jayson Sierras MD Signature Date/Time: 11/05/2023/3:21:06 PM    Final    CT CERVICAL SPINE WO CONTRAST Result Date: 11/04/2023 EXAM: CT CERVICAL SPINE WITHOUT CONTRAST 11/04/2023 03:38:30 PM TECHNIQUE: CT of the  cervical spine was performed without the administration of intravenous contrast. Multiplanar reformatted images are provided for review. Automated exposure control, iterative reconstruction, and/or weight based adjustment of the mA/kV was utilized to reduce the radiation dose to as low as reasonably achievable. COMPARISON: None available. CLINICAL HISTORY: Neck trauma (Age >= 65y). BIB EMS 2 weeks of back pain and weakness. [place]ens today and could not get up due to the weakness. Redness on left cheek from fall. Did not hit head, no thinners 64-166/82-CBG 135. FINDINGS: CERVICAL SPINE: BONES AND ALIGNMENT: No acute fracture or traumatic malalignment. Overall straightening of the normal cervical lordosis is present. DEGENERATIVE CHANGES: 2 mm anterolisthesis at C3-C4 appears chronic with advanced endplate and facet changes. Chronic endplate changes and ankylosis are present at C4-C5 and C5-C6. Chronic endplate changes are present at C6-C7. Grade 1 anterolisthesis at C7-T1 is degenerative. Asymmetric posterior facet arthropathy is present on the right at C3-C4. SOFT TISSUES: No prevertebral soft tissue swelling. IMPRESSION: 1. No acute abnormality of the cervical spine related to the reported neck trauma. 2. Chronic anterolisthesis at C3-4 with advanced endplate and facet arthropathy. 3. Degenerative  grade 1 anterolisthesis at C7-T1. Electronically signed by: Lonni Necessary MD 11/04/2023 03:48 PM EDT RP Workstation: [id number]   CT HEAD WO CONTRAST Result Date: 11/04/2023 EXAM: CT HEAD WITHOUT CONTRAST 11/04/2023 03:38:30 PM TECHNIQUE: CT of the head was performed without the administration of intravenous contrast. Automated exposure control, iterative reconstruction, and/or weight based adjustment of the mA/kV was utilized to reduce the radiation dose to as low as reasonably achievable. COMPARISON: None available. CLINICAL HISTORY: Minor head trauma (age >= 65 years). Patient presented via EMS with 2 weeks of  back pain and weakness, fell today and could not get up. Redness on the left cheek from the fall; patient did not hit head and is not on thinners. FINDINGS: BRAIN AND VENTRICLES: Moderate atrophy and white matter changes are noted bilaterally. Atherosclerotic calcifications are present in the cavernous carotid arteries bilaterally. No hyperdense vessel is present. No acute hemorrhage. No evidence of acute infarct. No hydrocephalus. No extra-axial collection. No mass effect or midline shift. ORBITS: No acute abnormality. SINUSES: Mild mucosal thickening is present in the right sphenoid sinus. Minimal fluid is present in the left maxillary sinus without associated fracture. SOFT TISSUES AND SKULL: Soft tissue swelling is present over the left temporal and posterior frontal scalp without underlying fracture or foreign body. Supraorbital scalp soft tissue swelling is present without underlying fracture or foreign body. No skull fracture. IMPRESSION: 1. No acute intracranial abnormality. 2. Soft tissue swelling over the left temporal, posterior frontal, and supraorbital scalp without underlying fracture or foreign body. Electronically signed by: Lonni Necessary MD 11/04/2023 03:44 PM EDT RP Workstation: [id number]   DG Chest Port 1 View Result Date: 11/04/2023 CLINICAL DATA:  Left-sided rib tenderness. Back pain and weakness for 2 weeks. Fall. EXAM: PORTABLE CHEST 1 VIEW COMPARISON:  10/24/2023 FINDINGS: Shallow inspiration. Large left diaphragmatic hernia, possibly hiatal or left hemidiaphragm. This contains stool filled colon with similar appearance to previous study. Atelectasis in the left lung base. Right lung is clear. Cardiac enlargement. Degenerative changes in the spine and shoulders. IMPRESSION: Large left diaphragmatic or hiatal hernia containing colon with similar appearance to previous study. Cardiac enlargement. Atelectasis in the left base. Electronically Signed   By: Elsie Gravely M.D.   On:  11/04/2023 15:17   DG Chest 2 View Result Date: 10/24/2023 CLINICAL DATA:  Concern for pneumonia. EXAM: CHEST - 2 VIEW COMPARISON:  Chest [place]  dated 04/29/2015. FINDINGS: No focal consolidation, pleural effusion or pneumothorax. Stable cardiac silhouette. There is a large hiatal hernia with associated compressive atelectasis of the left lung base. No acute osseous pathology. IMPRESSION: 1. No active cardiopulmonary disease. 2. Large hiatal hernia. Electronically Signed   By: Vanetta Chou M.D.   On: 10/24/2023 14:17    Microbiology: Recent Results (from the past 240 hours)  MRSA Next Gen by PCR, Nasal     Status: None   Collection Time: 11/08/23 10:38 AM   Specimen: Nasal Mucosa; Nasal Swab  Result Value Ref Range Status   MRSA by PCR Next Gen NOT DETECTED NOT DETECTED Final    Comment: (NOTE) The GeneXpert MRSA Assay (FDA approved for NASAL specimens only), is one component of a comprehensive MRSA colonization surveillance program. It is not intended to diagnose MRSA infection nor to guide or monitor treatment for MRSA infections. Test performance is not FDA approved in patients less than 46 years old. Performed at [facility], [address], [place], [place] [address]     Time spent: 30 minutes  Signed: Eusevio Schriver  Tobie, MD 2023-12-11

## 2023-11-26 NOTE — Progress Notes (Signed)
    OVERNIGHT PROGRESS REPORT  Notified by RN that patient is deceased as of 11/25/26 Hrs  Patient was DNR /CMO (comfort measures only)  2 RN verified.   Lynwood Kipper MSNA MSN ACNPC-AG Acute Care Nurse Practitioner [facility]

## 2023-11-26 NOTE — Progress Notes (Signed)
 [facility] Progress Note Patient: Anna Adams FMW:[id number] DOB: 1934/02/12  DOA: 11/04/2023 DOS: the patient was seen and examined on 2023-12-15  Brief Hospital Course: PMH of HTN, presented to the hospital secondary to fall. She was found down by her caretaker.  Patient was lying on her left side.  She was last seen normal night before and had dinner.  She was on the ground for unknown amount of time. patient could not recall if she tripped and fell or passed out. Son reported that on 9/29 he had brought her to the ED because she was dizzy and could not sit up in the bed.treated for UTI. During the hospital course becomes more lethargic and was found to have acute respiratory failure with hypercarbia and hypoxia requiring BiPAP therapy and has her blood pressure dropped requiring pressor therapy and was transferred to the ICU.  Assessment and Plan: Acute respiratory failure with hypercarbia,  acute metabolic encephalopathy pH of 7.2, pCO2 83.  After BiPAP pH improved pCO2 improved to 75. Most likely undiagnosed sleep apnea. Patient was with the ICU service. Recommendation is to continue nightly BiPAP Patient unable to tolerate BiPAP or CPAP both. Mentation progressively worsening after not using CPAP. Currently comfort care.  Recurrent fall. Dizziness. Left-sided weakness. Likely multifactorial. But patient does have left-sided weakness and facial droop. MRI brain negative for any acute stroke.   Orthostatic hypotension, dizziness Patient was aggressively diuresed. For now blood pressure does not drop significantly after changing position. Will monitor. B12 465, TSH 1.8 A.m. cortisol level normal 2D echo showed EF 70 to 75%, no acute valve stenosis   Pseudomonas urinary tract infection Went to see PCP on 10/12. Urine catheter was performed which grew Pseudomonas. Patient treated for 5 days with IV antibiotics.   SVT.  Left arm. Left arm swelling Venous Doppler  showed no DVT, age-indeterminate focal superficial vein thrombosis involving the left cephalic vein at the AC/site of venipuncture.  Patient had an IV line there, removed. Warm compresses, elevate arm   Hypertension Blood pressure is soft. Monitor.   Hyperlipidemia Was on statin.  AKI. Hypermagnesemia. Hypercalcemia. CKD 3B ruled out. Baseline renal function appears to be normal 0.8. Creatinine trended up to 1.7. After diuresis. Hypercalcemia has been treated with IV fluids for now.  Hypoglycemia from Poor p.o. intake. Adult failure to thrive. Currently receiving dextrose infusion.  Will complete infusion today and monitor CBG. Patient reports poor p.o. intake.  Remains hypoglycemic. Megace was given but no benefit.  Prognosis is poor  Hypothyroidism. Was on Synthroid . TSH normal.  Goals of care conversation. Palliative care discussed with the patient and the family. Currently DNR/DNI. Condition deteriorated rapidly over the last few days and currently comfort care only. Family is desirous to continue care here in the hospital. At present patient is requiring scheduled scopolamine patch as well as Robinul. Will monitor for progression.  Normocytic anemia. H&H relatively stable. Appears to be chronic in nature. Hemoglobin drop from 11-10 likely dilutional in nature.  Obesity Class 1 Body mass index is 31.88 kg/m.  Placing the pt at higher risk of poor outcomes.   Subjective: Appears comfortable.  No nausea vomiting fever no chills no chest pain.  Physical Exam: Upper airway crackles. S1-S2 present Bowel sound present  Data Reviewed: I have Reviewed nursing notes, Vitals, and Lab results. Discussed with palliative care.  Disposition: Status is: Inpatient Remains inpatient appropriate because: Monitor for hospital death.  Family Communication: Family at bedside Level of care: Med-Surg  Vitals:   11/14/23 2241 November 19, 2023 0500 11/19/2023 1345 2023/11/19 1400  BP:  (!) 104/47  (!) 117/55   Pulse: 74  93   Resp: 18  19   Temp: 98.2 F (36.8 C)  98.5 F (36.9 C)   TempSrc: Oral  Oral   SpO2: 90%  97% 95%  Weight:  71.6 kg    Height:         Author: Yetta Blanch, MD 19-Nov-2023 5:53 PM  Please look on [email or web address] to find out who is on call.

## 2023-11-26 NOTE — TOC Progression Note (Signed)
 Transition of Care [facility]) - Progression Note    Patient Details  Name: Anna Adams MRN: [id number] Date of Birth: 11-07-1934  [facility] CM/SW Contact  Ziyana Morikawa, Nathanel, RN Phone Number: Dec 07, 2023, 2:58 PM  Clinical Narrative:   spoke to Mark(son) he prefers for patient to stay in hospital(comfort care)& not transfer to residential hospice    Expected Discharge Plan:  (TBD) Barriers to Discharge: Continued Medical Work up               Expected Discharge Plan and Services In-house Referral: NA Discharge Planning Services: NA Post Acute Care Choice: Hospice Living arrangements for the past 2 months: Single Family Home                 DME Arranged: N/A DME Agency: NA       HH Arranged: NA HH Agency: NA         Social Drivers of Health (SDOH) Interventions SDOH Screenings   Food Insecurity: No Food Insecurity (11/05/2023)  Housing: Low Risk  (11/07/2023)  Transportation Needs: No Transportation Needs (11/07/2023)  Utilities: Not At Risk (11/07/2023)  Alcohol Screen: Low Risk  (05/25/2023)  Depression (PHQ2-9): Medium Risk (05/03/2023)  Financial Resource Strain: Low Risk  (05/25/2023)  Physical Activity: Inactive (05/25/2023)  Social Connections: Socially Isolated (11/07/2023)  Stress: No Stress Concern Present (05/25/2023)  Tobacco Use: Medium Risk (11/04/2023)  Health Literacy: Adequate Health Literacy (05/25/2023)    Readmission Risk Interventions    11/07/2023   12:30 PM  Readmission Risk Prevention Plan  Post Dischage Appt Complete  Medication Screening Complete  Transportation Screening Complete

## 2023-11-26 NOTE — Progress Notes (Signed)
   2023/11/28 1547  Spiritual Encounters  Type of Visit Initial  Care provided to: Family  Reason for visit Routine spiritual support  OnCall Visit No   Provided support to daughter.

## 2023-11-26 DEATH — deceased

## 2023-12-06 NOTE — Progress Notes (Signed)
 My response to the following coding query: Please clarify the medical condition(s) necessitating the order for SARS,RSV,Flu A&B   Evaluate for viral illness

## 2024-05-03 ENCOUNTER — Ambulatory Visit: Admitting: Internal Medicine
